# Patient Record
Sex: Male | Born: 1955
Health system: Southern US, Community
[De-identification: ages and names within clinical notes are randomized; demographics above are authoritative.]

## PROBLEM LIST (undated history)

## (undated) DIAGNOSIS — R351 Nocturia: Secondary | ICD-10-CM

## (undated) DIAGNOSIS — E291 Testicular hypofunction: Secondary | ICD-10-CM

## (undated) DIAGNOSIS — T8859XA Other complications of anesthesia, initial encounter: Secondary | ICD-10-CM

## (undated) DIAGNOSIS — J40 Bronchitis, not specified as acute or chronic: Secondary | ICD-10-CM

## (undated) DIAGNOSIS — E119 Type 2 diabetes mellitus without complications: Secondary | ICD-10-CM

## (undated) DIAGNOSIS — R350 Frequency of micturition: Secondary | ICD-10-CM

## (undated) DIAGNOSIS — K219 Gastro-esophageal reflux disease without esophagitis: Secondary | ICD-10-CM

## (undated) DIAGNOSIS — I10 Essential (primary) hypertension: Secondary | ICD-10-CM

## (undated) DIAGNOSIS — N400 Enlarged prostate without lower urinary tract symptoms: Secondary | ICD-10-CM

## (undated) DIAGNOSIS — E785 Hyperlipidemia, unspecified: Secondary | ICD-10-CM

## (undated) DIAGNOSIS — F419 Anxiety disorder, unspecified: Secondary | ICD-10-CM

## (undated) DIAGNOSIS — M1990 Unspecified osteoarthritis, unspecified site: Secondary | ICD-10-CM

## (undated) DIAGNOSIS — T4145XA Adverse effect of unspecified anesthetic, initial encounter: Secondary | ICD-10-CM

## (undated) DIAGNOSIS — I82409 Acute embolism and thrombosis of unspecified deep veins of unspecified lower extremity: Secondary | ICD-10-CM

## (undated) DIAGNOSIS — T7840XA Allergy, unspecified, initial encounter: Secondary | ICD-10-CM

## (undated) DIAGNOSIS — M25371 Other instability, right ankle: Secondary | ICD-10-CM

## (undated) DIAGNOSIS — G4730 Sleep apnea, unspecified: Secondary | ICD-10-CM

## (undated) DIAGNOSIS — Z227 Latent tuberculosis: Secondary | ICD-10-CM

## (undated) DIAGNOSIS — N529 Male erectile dysfunction, unspecified: Secondary | ICD-10-CM

## (undated) HISTORY — DX: Unspecified osteoarthritis, unspecified site: M19.90

## (undated) HISTORY — DX: Allergy, unspecified, initial encounter: T78.40XA

## (undated) HISTORY — DX: Essential (primary) hypertension: I10

## (undated) HISTORY — DX: Benign prostatic hyperplasia without lower urinary tract symptoms: N40.0

## (undated) HISTORY — DX: Testicular hypofunction: E29.1

## (undated) HISTORY — PX: KNEE ARTHROSCOPY: SUR90

## (undated) HISTORY — DX: Anxiety disorder, unspecified: F41.9

## (undated) HISTORY — DX: Acute embolism and thrombosis of unspecified deep veins of unspecified lower extremity: I82.409

## (undated) HISTORY — PX: HERNIA REPAIR: SHX51

## (undated) HISTORY — DX: Other instability, right ankle: M25.371

## (undated) HISTORY — DX: Type 2 diabetes mellitus without complications: E11.9

## (undated) HISTORY — DX: Frequency of micturition: R35.0

## (undated) HISTORY — DX: Gastro-esophageal reflux disease without esophagitis: K21.9

## (undated) HISTORY — DX: Sleep apnea, unspecified: G47.30

## (undated) HISTORY — DX: Hyperlipidemia, unspecified: E78.5

## (undated) HISTORY — DX: Latent tuberculosis: Z22.7

## (undated) HISTORY — DX: Male erectile dysfunction, unspecified: N52.9

## (undated) HISTORY — PX: ANKLE SURGERY: SHX546

## (undated) HISTORY — DX: Nocturia: R35.1

---

## 2006-02-23 ENCOUNTER — Ambulatory Visit: Payer: Self-pay | Admitting: General Surgery

## 2006-12-01 HISTORY — PX: OTHER SURGICAL HISTORY: SHX169

## 2006-12-15 ENCOUNTER — Ambulatory Visit (HOSPITAL_BASED_OUTPATIENT_CLINIC_OR_DEPARTMENT_OTHER): Admission: RE | Admit: 2006-12-15 | Discharge: 2006-12-16 | Payer: Self-pay | Admitting: Orthopedic Surgery

## 2007-01-02 ENCOUNTER — Inpatient Hospital Stay: Payer: Self-pay | Admitting: Internal Medicine

## 2007-01-02 ENCOUNTER — Other Ambulatory Visit: Payer: Self-pay

## 2007-01-14 ENCOUNTER — Ambulatory Visit: Payer: Self-pay | Admitting: General Practice

## 2007-02-25 ENCOUNTER — Ambulatory Visit: Payer: Self-pay | Admitting: Vascular Surgery

## 2007-08-02 ENCOUNTER — Ambulatory Visit: Payer: Self-pay | Admitting: Internal Medicine

## 2007-08-17 ENCOUNTER — Ambulatory Visit: Payer: Self-pay | Admitting: Internal Medicine

## 2007-09-01 ENCOUNTER — Ambulatory Visit: Payer: Self-pay | Admitting: Internal Medicine

## 2007-12-02 HISTORY — PX: COLONOSCOPY: SHX174

## 2007-12-07 ENCOUNTER — Ambulatory Visit: Payer: Self-pay | Admitting: Vascular Surgery

## 2008-01-02 ENCOUNTER — Ambulatory Visit: Payer: Self-pay | Admitting: Internal Medicine

## 2008-05-30 ENCOUNTER — Ambulatory Visit: Payer: Self-pay | Admitting: General Practice

## 2008-08-15 ENCOUNTER — Ambulatory Visit: Payer: Self-pay | Admitting: General Practice

## 2008-10-05 ENCOUNTER — Ambulatory Visit: Payer: Self-pay | Admitting: Unknown Physician Specialty

## 2010-07-26 ENCOUNTER — Ambulatory Visit: Payer: Self-pay | Admitting: General Practice

## 2012-03-11 ENCOUNTER — Emergency Department: Payer: Self-pay | Admitting: Emergency Medicine

## 2013-02-21 DIAGNOSIS — M25371 Other instability, right ankle: Secondary | ICD-10-CM | POA: Insufficient documentation

## 2015-02-04 ENCOUNTER — Emergency Department: Payer: Self-pay | Admitting: Emergency Medicine

## 2015-05-17 ENCOUNTER — Telehealth: Payer: Self-pay

## 2015-05-17 NOTE — Telephone Encounter (Signed)
Spoke with Jonathan Fields from [employer] in [place] in reference to pt PA for cialis 5mg . Medication was approved for 4 tab [id number]. Per Jonathan Fields pt wants to take medication daily for BPH s/s. Advised Jonathan Fields pt will need to make a f/u appt to speak with Dr. Elnoria Howard about this. Cw,lpn

## 2015-06-06 ENCOUNTER — Telehealth: Payer: Self-pay | Admitting: Urology

## 2015-06-08 NOTE — Telephone Encounter (Signed)
none

## 2015-06-20 ENCOUNTER — Encounter: Payer: Self-pay | Admitting: Urology

## 2015-06-20 ENCOUNTER — Ambulatory Visit (INDEPENDENT_AMBULATORY_CARE_PROVIDER_SITE_OTHER): Payer: BLUE CROSS/BLUE SHIELD | Admitting: Urology

## 2015-06-20 VITALS — BP 155/94 | HR 88 | Ht 74.0 in | Wt <= 1120 oz

## 2015-06-20 DIAGNOSIS — N528 Other male erectile dysfunction: Secondary | ICD-10-CM

## 2015-06-20 DIAGNOSIS — R350 Frequency of micturition: Secondary | ICD-10-CM | POA: Diagnosis not present

## 2015-06-20 DIAGNOSIS — N529 Male erectile dysfunction, unspecified: Secondary | ICD-10-CM

## 2015-06-20 LAB — MICROSCOPIC EXAMINATION: Bacteria, UA: NONE SEEN

## 2015-06-20 LAB — URINALYSIS, COMPLETE
Bilirubin, UA: NEGATIVE
Leukocytes, UA: NEGATIVE
Nitrite, UA: NEGATIVE
RBC, UA: NEGATIVE
Specific Gravity, UA: 1.025 (ref 1.005–1.030)
Urobilinogen, Ur: 1 mg/dL (ref 0.2–1.0)
pH, UA: 6.5 (ref 5.0–7.5)

## 2015-06-20 MED ORDER — TADALAFIL 20 MG PO TABS: 20.0000 mg | ORAL_TABLET | Freq: Every day | ORAL | Status: DC | PRN

## 2015-06-20 NOTE — Progress Notes (Signed)
Patient in the office today. I recently seen him worked him up. He tried to obtain Cialis. Apparently the Cialis was not felt that his drug store as ordered. I reordered the Cialis today will continue on the Cialis as it helped him immensely. I'll see him again in about 2 months.

## 2015-06-21 ENCOUNTER — Other Ambulatory Visit: Payer: Self-pay

## 2015-06-21 DIAGNOSIS — N529 Male erectile dysfunction, unspecified: Secondary | ICD-10-CM

## 2015-06-21 MED ORDER — TADALAFIL 5 MG PO TABS: 5.0000 mg | ORAL_TABLET | Freq: Every day | ORAL | Status: DC | PRN

## 2015-06-26 ENCOUNTER — Telehealth: Payer: Self-pay

## 2015-06-26 NOTE — Telephone Encounter (Signed)
Pt called stating he is unable to fill 30 day supply of cialis. Per Dr. Guinevere Ferrari office note from [employer] pt was receive a 30 day supply. Nurse made pt aware PA has previously been denied due to medication being rx for ED. Pt voiced understanding stating he would pay the difference. Nurse called in 30 day supply for pt based on Dr. Guinevere Ferrari note from 01/2015.

## 2015-08-24 ENCOUNTER — Ambulatory Visit: Payer: BLUE CROSS/BLUE SHIELD | Admitting: Urology

## 2015-08-30 ENCOUNTER — Encounter: Payer: Self-pay | Admitting: Urology

## 2015-08-30 ENCOUNTER — Ambulatory Visit (INDEPENDENT_AMBULATORY_CARE_PROVIDER_SITE_OTHER): Payer: BLUE CROSS/BLUE SHIELD | Admitting: Urology

## 2015-08-30 VITALS — BP 156/89 | HR 81 | Ht 74.0 in | Wt 259.6 lb

## 2015-08-30 DIAGNOSIS — E291 Testicular hypofunction: Secondary | ICD-10-CM | POA: Diagnosis not present

## 2015-08-30 DIAGNOSIS — R350 Frequency of micturition: Secondary | ICD-10-CM | POA: Diagnosis not present

## 2015-08-30 DIAGNOSIS — N529 Male erectile dysfunction, unspecified: Secondary | ICD-10-CM | POA: Diagnosis not present

## 2015-08-30 DIAGNOSIS — N400 Enlarged prostate without lower urinary tract symptoms: Secondary | ICD-10-CM

## 2015-08-30 MED ORDER — TAMSULOSIN HCL 0.4 MG PO CAPS: 0.8000 mg | ORAL_CAPSULE | Freq: Every day | ORAL | Status: DC

## 2015-08-30 NOTE — Progress Notes (Signed)
08/30/2015 3:14 PM   Jonathan Fields [birth year] [id number]  Referring provider: Shepard General, MD [facility] [address] [place], [place] [address]  Chief Complaint  Patient presents with  . Urinary Frequency    follow up    HPI: The patient is a 59 year old male possible history of ED and BPH presents for follow-up. He is currently on Cialis 5 mg and Flomax 0.4 mg. He still complains of frequency 15 today and nocturia 1-2 per night. He finds very bothersome and would like a medication to correct this. He returns with his erections, Cialis for the most part is able to provide erection hard enough for penetration. He is very happy with the erectile dysfunction medication. He also has hypogonadism on testosterone. This is managed by his primary care clinic.   PMH: Past Medical History  Diagnosis Date  . Hyperlipemia   . Diabetes   . Sleep apnea   . Hypertension   . GERD (gastroesophageal reflux disease)   . Hypogonadism in male   . DVT (deep venous thrombosis)     post op  . Nocturia   . Urinary frequency   . ED (erectile dysfunction)     Surgical History: Past Surgical History  Procedure Laterality Date  . Colonoscopy  2009    Home Medications:    Medication List       This list is accurate as of: 08/30/15  3:14 PM.  Always use your most recent med list.               bisoprolol-hydrochlorothiazide 5-6.25 MG tablet  Commonly known as:  ZIAC  Take 2 tablets by mouth daily.     fluticasone 50 MCG/ACT nasal spray  Commonly known as:  FLONASE  instill 1 to 2 sprays into each nostril once daily     metFORMIN 1000 MG tablet  Commonly known as:  GLUCOPHAGE  Take 1,000 mg by mouth 2 (two) times daily.     omeprazole 10 MG capsule  Commonly known as:  PRILOSEC  Take by mouth.     tadalafil 5 MG tablet  Commonly known as:  CIALIS  Take 1 tablet (5 mg total) by mouth daily as needed.     tamsulosin 0.4 MG Caps capsule    Commonly known as:  FLOMAX  Take 2 capsules (0.8 mg total) by mouth daily.     testosterone cypionate 200 MG/ML injection  Commonly known as:  DEPOTESTOSTERONE CYPIONATE        Allergies:  Allergies  Allergen Reactions  . Penicillins Hives    Family History: Family History  Problem Relation Age of Onset  . Prostate cancer Father     Social History:  reports that he has never smoked. He does not have any smokeless tobacco history on file. He reports that he does not drink alcohol or use illicit drugs.  ROS: UROLOGY Frequent Urination?: Yes Hard to postpone urination?: No Burning/pain with urination?: No Get up at night to urinate?: Yes Leakage of urine?: No Urine stream starts and stops?: No Trouble starting stream?: No Do you have to strain to urinate?: No Blood in urine?: No Urinary tract infection?: No Sexually transmitted disease?: No Injury to kidneys or bladder?: No Painful intercourse?: No Weak stream?: No Erection problems?: No Penile pain?: No  Gastrointestinal Nausea?: No Vomiting?: No Indigestion/heartburn?: No Diarrhea?: No Constipation?: No  Constitutional Fever: No Night sweats?: No Weight loss?: No Fatigue?: No  Skin Skin rash/lesions?: No Itching?: No  Eyes Blurred vision?: No Double vision?: No  Ears/Nose/Throat Sore throat?: No Sinus problems?: No  Hematologic/Lymphatic Swollen glands?: No Easy bruising?: No  Cardiovascular Leg swelling?: No Chest pain?: No  Respiratory Cough?: No Shortness of breath?: No  Endocrine Excessive thirst?: No  Musculoskeletal Back pain?: No Joint pain?: Yes  Neurological Headaches?: No Dizziness?: No  Psychologic Depression?: No Anxiety?: No  Physical Exam: BP 156/89 mmHg  Pulse 81  Ht 6\' 2"  (1.88 m)  Wt 259 lb 9.6 oz (117.754 kg)  BMI 33.32 kg/m2  Constitutional:  Alert and oriented, No acute distress. HEENT: [facility] AT, moist mucus membranes.  Trachea midline, no  masses. Cardiovascular: No clubbing, cyanosis, or edema. Respiratory: Normal respiratory effort, no increased work of breathing. GI: Abdomen is soft, nontender, nondistended, no abdominal masses GU: No CVA tenderness.  Skin: No rashes, bruises or suspicious lesions. Lymph: No cervical or inguinal adenopathy. Neurologic: Grossly intact, no focal deficits, moving all 4 extremities. Psychiatric: Normal mood and affect.  Laboratory Data: No results found for: WBC, HGB, HCT, MCV, PLT  No results found for: CREATININE  No results found for: PSA  No results found for: TESTOSTERONE  No results found for: HGBA1C  Urinalysis    Component Value Date/Time   GLUCOSEU Trace* 06/20/2015 1427   BILIRUBINUR Negative 06/20/2015 1427   NITRITE Negative 06/20/2015 1427   LEUKOCYTESUR Negative 06/20/2015 1427   Assessment & Plan:    1. BPH with LUTS -continue cialis 5 mg -increase flomax to 0.8 mg daily  2. Erectile Dysfunction -continue cialis 5 mg daily  3.  Hypogonadism -Will continue to follow-up for testosterone injections at primary care clinic  Return in about 3 months (around 11/29/2015).  [name], MD  [facility] [address] [place], [place] [address] [phone]

## 2015-09-03 LAB — MICROSCOPIC EXAMINATION
Bacteria, UA: NONE SEEN
Renal Epithel, UA: NONE SEEN /hpf

## 2015-09-03 LAB — URINALYSIS, COMPLETE
Bilirubin, UA: NEGATIVE
Glucose, UA: NEGATIVE
Ketones, UA: NEGATIVE
Leukocytes, UA: NEGATIVE
Nitrite, UA: NEGATIVE
Specific Gravity, UA: 1.025 (ref 1.005–1.030)
Urobilinogen, Ur: 1 mg/dL (ref 0.2–1.0)
pH, UA: 6.5 (ref 5.0–7.5)

## 2015-11-14 ENCOUNTER — Telehealth: Payer: Self-pay | Admitting: Urology

## 2015-11-14 ENCOUNTER — Telehealth: Payer: Self-pay

## 2015-11-14 NOTE — Telephone Encounter (Signed)
See previous note

## 2015-11-14 NOTE — Telephone Encounter (Signed)
Spoke with pt and made aware PA for cialis 5mg  is approved for 11/09/15-12/9-17.

## 2015-11-14 NOTE — Telephone Encounter (Signed)
Pt tried getting Cialis 5 mg filled at his pharmacy, [facility] in [place], last week.  Pharmacy stated that his prescription was denied by his insurance and patient wants to know why.  He has multiple refills on it.  Please call [phone].

## 2015-11-30 ENCOUNTER — Encounter: Payer: Self-pay | Admitting: Urology

## 2015-11-30 ENCOUNTER — Other Ambulatory Visit: Payer: Self-pay

## 2015-11-30 ENCOUNTER — Ambulatory Visit (INDEPENDENT_AMBULATORY_CARE_PROVIDER_SITE_OTHER): Payer: BLUE CROSS/BLUE SHIELD | Admitting: Urology

## 2015-11-30 VITALS — BP 129/83 | HR 80 | Ht 74.0 in | Wt 254.2 lb

## 2015-11-30 DIAGNOSIS — N400 Enlarged prostate without lower urinary tract symptoms: Secondary | ICD-10-CM

## 2015-11-30 DIAGNOSIS — N5201 Erectile dysfunction due to arterial insufficiency: Secondary | ICD-10-CM

## 2015-11-30 DIAGNOSIS — N3281 Overactive bladder: Secondary | ICD-10-CM | POA: Diagnosis not present

## 2015-11-30 LAB — URINALYSIS, COMPLETE
Bilirubin, UA: NEGATIVE
Ketones, UA: NEGATIVE
Leukocytes, UA: NEGATIVE
Nitrite, UA: NEGATIVE
RBC, UA: NEGATIVE
Specific Gravity, UA: >1.03 — ABNORMAL HIGH (ref 1.005–1.030)
Urobilinogen, Ur: 0.2 mg/dL (ref 0.2–1.0)
pH, UA: 5.5 (ref 5.0–7.5)

## 2015-11-30 LAB — MICROSCOPIC EXAMINATION
Epithelial Cells (non renal): NONE SEEN /hpf (ref 0–10)
RBC, UA: NONE SEEN /hpf (ref 0–?)

## 2015-11-30 LAB — BLADDER SCAN AMB NON-IMAGING: Scan Result: 15

## 2015-11-30 MED ORDER — MIRABEGRON ER 25 MG PO TB24: 25.0000 mg | ORAL_TABLET | Freq: Every day | ORAL | Status: DC

## 2015-11-30 NOTE — Progress Notes (Signed)
Bladder Scan Patient void: 8ml Performed By: Larna Daughters

## 2015-11-30 NOTE — Progress Notes (Signed)
11/30/2015 1:34 PM   Jonathan Fields 09/14/1956 [id number]  Referring provider: Shepard General, MD [facility] [place] [address], [place] [address]  Chief Complaint  Patient presents with  . Benign Prostatic Hypertrophy    f/u    HPI: The patient is a 59 year old male possible history of ED and BPH presents for follow-up. He is currently on Cialis 5 mg and Flomax 0.4 mg. He still complains of frequency 15 today and nocturia 1-2 per night. He finds very bothersome and would like a medication to correct this. He returns with his erections, Cialis for the most part is able to provide erection hard enough for penetration. He is very happy with the erectile dysfunction medication. He also has hypogonadism on testosterone. This is managed by his primary care clinic.  December 2016 Interval History: The patient returns today for symptom at follow-up. Ventricular erectile dysfunction and Cialis is working well for him. His I PSS score today is 12/4. He has significant frequency urgency and nocturia. He does not have much problem with weak stream, straining, intermittency or incomplete emptying. His PVR today is 21.  He does drink 3-4 bottles of water during the day. He stops drinking fluid around 8:00. He said that he is persistently up throughout the night urinating sometimes up to 4 times. The frequency is very bothersome to him. Even when he decrease his fluid intake, he continues to have frequency at the same right.   PMH: Past Medical History  Diagnosis Date  . Hyperlipemia   . Diabetes   . Sleep apnea   . Hypertension   . GERD (gastroesophageal reflux disease)   . Hypogonadism in male   . DVT (deep venous thrombosis)     post op  . Nocturia   . Urinary frequency   . ED (erectile dysfunction)     Surgical History: Past Surgical History  Procedure Laterality Date  . Colonoscopy  2009    Home Medications:    Medication List       This  list is accurate as of: 11/30/15  1:34 PM.  Always use your most recent med list.               azithromycin 250 MG tablet  Commonly known as:  ZITHROMAX  take 2 tablets by mouth on day 1 then 1 tablet on days 2 through 5     bisoprolol-hydrochlorothiazide 5-6.25 MG tablet  Commonly known as:  ZIAC  Take 2 tablets by mouth daily.     CIALIS 5 MG tablet  Generic drug:  tadalafil  Take by mouth.     etodolac 500 MG tablet  Commonly known as:  LODINE  Reported on 11/30/2015     fluticasone 50 MCG/ACT nasal spray  Commonly known as:  FLONASE  instill 1 to 2 sprays into each nostril once daily     GUAIFENESIN AC 100-10 MG/5ML syrup  Generic drug:  guaiFENesin-codeine  take 5 milliliters by mouth three times a day if needed     metFORMIN 1000 MG tablet  Commonly known as:  GLUCOPHAGE  Take 1,000 mg by mouth 2 (two) times daily.     omeprazole 10 MG capsule  Commonly known as:  PRILOSEC  Take by mouth. Reported on 11/30/2015     Protein Powd  Take by mouth.     tamsulosin 0.4 MG Caps capsule  Commonly known as:  FLOMAX  Take 2 capsules (0.8 mg total) by mouth daily.  testosterone cypionate 200 MG/ML injection  Commonly known as:  DEPOTESTOSTERONE CYPIONATE        Allergies:  Allergies  Allergen Reactions  . Penicillins Hives    Family History: Family History  Problem Relation Age of Onset  . Prostate cancer Father     Social History:  reports that he has never smoked. He does not have any smokeless tobacco history on file. He reports that he does not drink alcohol or use illicit drugs.  ROS:                                        Physical Exam: There were no vitals taken for this visit.  Constitutional:  Alert and oriented, No acute distress. HEENT: [place] AT, moist mucus membranes.  Trachea midline, no masses. Cardiovascular: No clubbing, cyanosis, or edema. Respiratory: Normal respiratory effort, no increased work of  breathing. GI: Abdomen is soft, nontender, nondistended, no abdominal masses GU: No CVA tenderness.  Skin: No rashes, bruises or suspicious lesions. Lymph: No cervical or inguinal adenopathy. Neurologic: Grossly intact, no focal deficits, moving all 4 extremities. Psychiatric: Normal mood and affect.  Laboratory Data: No results found for: WBC, HGB, HCT, MCV, PLT  No results found for: CREATININE  No results found for: PSA  No results found for: TESTOSTERONE  No results found for: HGBA1C  Urinalysis    Component Value Date/Time   GLUCOSEU Negative 08/30/2015 1451   BILIRUBINUR Negative 08/30/2015 1451   NITRITE Negative 08/30/2015 1451   LEUKOCYTESUR Negative 08/30/2015 1451     Assessment & Plan:    The patient's obstructive voiding symptoms seem to be well treated due to his low PVR and minimal complaints in regards to obstructive urinary symptoms. He does however have significant symptoms suggesting overactive bladder. We will try him on Myrbetriq 25 mg daily. He was given 2 weeks of samples and prescription was sent to his pharmacy. We'll follow-up in 3 months to check on his progress. He is due for PSA and DRE at that time.  1. BPH with LUTS -continue cialis 5 mg -increase flomax to 0.8 mg daily  2. Overactive bladder -Myrbetriq 25 mg daily. Samples given and Rx sent to pharmacy  3. Erectile Dysfunction -continue cialis 5 mg daily  3. Hypogonadism -Will continue to follow-up for testosterone injections at primary care clinic  4. PSA Screening PSA/DRE at next visit  Return in about 3 months with PSA prior   [name], [place] [address] [place], [place] [address] [phone]

## 2015-12-07 ENCOUNTER — Telehealth: Payer: Self-pay

## 2015-12-07 NOTE — Telephone Encounter (Signed)
Pt pharmacy sent a PA for myrbetriq 25mg . PA was obtained and insurance co responded with "PPA not required". Pt has been made aware. Pt is going to check with pharmacy for cost. If not able to afford [facility] will supply samples.

## 2015-12-31 ENCOUNTER — Telehealth: Payer: Self-pay | Admitting: Radiology

## 2015-12-31 NOTE — Telephone Encounter (Signed)
Pt called stating Myrbetriq was declined by insurance. Pt wants samples or a different medication. Please return call at [phone].

## 2016-01-01 NOTE — Telephone Encounter (Signed)
Spoke with pt in reference to myrbetriq. Made aware samples will be left at the front. Nurse will be completing a form provided by rep to appeal denial from insurance co. Pt voiced understanding.

## 2016-02-20 ENCOUNTER — Other Ambulatory Visit: Payer: Self-pay

## 2016-02-20 DIAGNOSIS — N400 Enlarged prostate without lower urinary tract symptoms: Secondary | ICD-10-CM

## 2016-02-21 ENCOUNTER — Other Ambulatory Visit: Payer: BLUE CROSS/BLUE SHIELD

## 2016-02-21 DIAGNOSIS — N400 Enlarged prostate without lower urinary tract symptoms: Secondary | ICD-10-CM

## 2016-02-22 LAB — PSA: Prostate Specific Ag, Serum: 2.9 ng/mL (ref 0.0–4.0)

## 2016-02-25 ENCOUNTER — Telehealth: Payer: Self-pay

## 2016-02-25 NOTE — Telephone Encounter (Signed)
LMOM

## 2016-02-25 NOTE — Telephone Encounter (Signed)
-----   Message from [name], MD sent at 02/22/2016 12:21 PM EDT ----- Please let patient know PSA is normal.

## 2016-02-26 NOTE — Telephone Encounter (Signed)
LMOM- will send a letter.  

## 2016-02-28 ENCOUNTER — Ambulatory Visit (INDEPENDENT_AMBULATORY_CARE_PROVIDER_SITE_OTHER): Payer: BLUE CROSS/BLUE SHIELD | Admitting: Urology

## 2016-02-28 VITALS — BP 147/92 | HR 85 | Ht 74.0 in | Wt 256.0 lb

## 2016-02-28 DIAGNOSIS — E291 Testicular hypofunction: Secondary | ICD-10-CM

## 2016-02-28 DIAGNOSIS — Z125 Encounter for screening for malignant neoplasm of prostate: Secondary | ICD-10-CM

## 2016-02-28 DIAGNOSIS — N400 Enlarged prostate without lower urinary tract symptoms: Secondary | ICD-10-CM

## 2016-02-28 DIAGNOSIS — N529 Male erectile dysfunction, unspecified: Secondary | ICD-10-CM

## 2016-02-28 DIAGNOSIS — N3281 Overactive bladder: Secondary | ICD-10-CM

## 2016-02-28 LAB — BLADDER SCAN AMB NON-IMAGING

## 2016-02-28 NOTE — Progress Notes (Signed)
02/28/2016 3:38 PM   Jonathan Fields 1956-04-15 [id number]  Referring provider: Shepard General, MD [facility] [address] [place], [place] [address]  Chief Complaint  Patient presents with  . Benign Prostatic Hypertrophy    44month    HPI: The patient is a 60 year old male possible history of ED and BPH presents for follow-up. He is currently on Cialis 5 mg and Flomax 0.4 mg. He still complains of frequency 15 today and nocturia 1-2 per night. He finds very bothersome and would like a medication to correct this. He returns with his erections, Cialis for the most part is able to provide erection hard enough for penetration. He is very happy with the erectile dysfunction medication. He also has hypogonadism on testosterone. This is managed by his primary care clinic.  December 2016 Interval History: The patient returns today for symptom at follow-up. Ventricular erectile dysfunction and Cialis is working well for him. His I PSS score today is 12/4. He has significant frequency urgency and nocturia. He does not have much problem with weak stream, straining, intermittency or incomplete emptying. His PVR today is 21. He does drink 3-4 bottles of water during the day. He stops drinking fluid around 8:00. He said that he is persistently up throughout the night urinating sometimes up to 4 times. The frequency is very bothersome to him. Even when he decrease his fluid intake, he continues to have frequency at the same right.  March 2017 Interval History: Due to his significant symptoms, his Flomax was increased to 0.8 mg daily, and he was started on Myrbetriq at his last visit. He notes no changes in his symptoms. However see PVR has increased to 144. His I PSS score today is 14/5. He has nocturia 1. His biggest complaint is urgency and frequency.  He is able to obtain good erection with Cialis 5 mg when necessary.  PSA March 2017: 2.9  PMH: Past Medical  History  Diagnosis Date  . Hyperlipemia   . Diabetes ([place])   . Sleep apnea   . Hypertension   . GERD (gastroesophageal reflux disease)   . Hypogonadism in male   . DVT (deep venous thrombosis) ([place])     post op  . Nocturia   . Urinary frequency   . ED (erectile dysfunction)     Surgical History: Past Surgical History  Procedure Laterality Date  . Colonoscopy  2009    Home Medications:    Medication List       This list is accurate as of: 02/28/16  3:38 PM.  Always use your most recent med list.               bisoprolol-hydrochlorothiazide 5-6.25 MG tablet  Commonly known as:  ZIAC  Take 2 tablets by mouth daily.     CIALIS 5 MG tablet  Generic drug:  tadalafil  Take by mouth.     etodolac 500 MG tablet  Commonly known as:  LODINE  Reported on 11/30/2015     fluticasone 50 MCG/ACT nasal spray  Commonly known as:  FLONASE  instill 1 to 2 sprays into each nostril once daily     metFORMIN 1000 MG tablet  Commonly known as:  GLUCOPHAGE  Take 1,000 mg by mouth 2 (two) times daily.     mirabegron ER 25 MG Tb24 tablet  Commonly known as:  MYRBETRIQ  Take 1 tablet (25 mg total) by mouth daily.     omeprazole 10 MG capsule  Commonly known as:  PRILOSEC  Take by mouth. Reported on 11/30/2015     Protein Powd  Take by mouth.     tamsulosin 0.4 MG Caps capsule  Commonly known as:  FLOMAX  Take 2 capsules (0.8 mg total) by mouth daily.     testosterone cypionate 200 MG/ML injection  Commonly known as:  DEPOTESTOSTERONE CYPIONATE        Allergies:  Allergies  Allergen Reactions  . Penicillins Hives    Family History: Family History  Problem Relation Age of Onset  . Prostate cancer Father     Social History:  reports that he has never smoked. He does not have any smokeless tobacco history on file. He reports that he does not drink alcohol or use illicit drugs.  ROS: UROLOGY Frequent Urination?: Yes Hard to postpone urination?: Yes Burning/pain  with urination?: No Get up at night to urinate?: Yes Leakage of urine?: No Urine stream starts and stops?: Yes Trouble starting stream?: No Do you have to strain to urinate?: No Blood in urine?: No Urinary tract infection?: No Sexually transmitted disease?: No Injury to kidneys or bladder?: No Painful intercourse?: No Weak stream?: No Erection problems?: No Penile pain?: No  Gastrointestinal Nausea?: No Vomiting?: No Indigestion/heartburn?: No Diarrhea?: No Constipation?: No  Constitutional Fever: No Night sweats?: No Weight loss?: No Fatigue?: No  Skin Skin rash/lesions?: No Itching?: No  Eyes Blurred vision?: No Double vision?: No  Ears/Nose/Throat Sore throat?: No Sinus problems?: Yes  Hematologic/Lymphatic Swollen glands?: No Easy bruising?: No  Cardiovascular Leg swelling?: No Chest pain?: No  Respiratory Cough?: No Shortness of breath?: No  Endocrine Excessive thirst?: No  Musculoskeletal Back pain?: No Joint pain?: No  Neurological Headaches?: No Dizziness?: No  Psychologic Depression?: No Anxiety?: No  Physical Exam: BP 147/92 mmHg  Pulse 85  Ht 6\' 2"  (1.88 m)  Wt 256 lb (116.121 kg)  BMI 32.85 kg/m2  Constitutional:  Alert and oriented, No acute distress. HEENT: [place] AT, moist mucus membranes.  Trachea midline, no masses. Cardiovascular: No clubbing, cyanosis, or edema. Respiratory: Normal respiratory effort, no increased work of breathing. GI: Abdomen is soft, nontender, nondistended, no abdominal masses GU: No CVA tenderness. DRE: 2+, smooth, no nodules. Skin: No rashes, bruises or suspicious lesions. Lymph: No cervical or inguinal adenopathy. Neurologic: Grossly intact, no focal deficits, moving all 4 extremities. Psychiatric: Normal mood and affect.  Laboratory Data: No results found for: WBC, HGB, HCT, MCV, PLT  No results found for: CREATININE  No results found for: PSA  No results found for: TESTOSTERONE  No  results found for: HGBA1C  Urinalysis    Component Value Date/Time   APPEARANCEUR Clear 11/30/2015 1326   GLUCOSEU 2+* 11/30/2015 1326   BILIRUBINUR Negative 11/30/2015 1326   PROTEINUR 1+* 11/30/2015 1326   NITRITE Negative 11/30/2015 1326   LEUKOCYTESUR Negative 11/30/2015 1326    Assessment & Plan:    1. BPH with LUTS -increase flomax to 0.8 mg daily -Will have the patient keep a voiding diary for the next few weeks. He'll then follow-up to go over this in 2-3 weeks. If this does not elicit the cause of his symptoms, I think he would be a good candidate for urodynamic studies as he his main complain is urgency despite Myrbetriq and low PVR.  2. Urinary frequency/urgency -Discontinue Myrbetriq as this has not helped his urgency or frequency symptoms and caused a slight increase in his PVR.  3. Erectile Dysfunction -continue cialis 5 mg daily  3. Hypogonadism -Will continue to follow-up for testosterone injections at primary care clinic  4. PSA Screening Up to date. Due for repeat PSA and DRE in March 2018.  Return in about 3 weeks (around 03/20/2016).  [name], MD  [facility] [address] [place], [place] [address] [phone]

## 2016-03-21 ENCOUNTER — Ambulatory Visit (INDEPENDENT_AMBULATORY_CARE_PROVIDER_SITE_OTHER): Payer: BLUE CROSS/BLUE SHIELD | Admitting: Urology

## 2016-03-21 ENCOUNTER — Encounter: Payer: Self-pay | Admitting: Urology

## 2016-03-21 VITALS — BP 151/91 | HR 83 | Ht 74.0 in | Wt 255.0 lb

## 2016-03-21 DIAGNOSIS — N400 Enlarged prostate without lower urinary tract symptoms: Secondary | ICD-10-CM | POA: Diagnosis not present

## 2016-03-21 DIAGNOSIS — N3281 Overactive bladder: Secondary | ICD-10-CM | POA: Diagnosis not present

## 2016-03-21 DIAGNOSIS — N529 Male erectile dysfunction, unspecified: Secondary | ICD-10-CM

## 2016-03-21 LAB — URINALYSIS, COMPLETE
Bilirubin, UA: NEGATIVE
Glucose, UA: NEGATIVE
Ketones, UA: NEGATIVE
Leukocytes, UA: NEGATIVE
Nitrite, UA: NEGATIVE
RBC, UA: NEGATIVE
Specific Gravity, UA: 1.025 (ref 1.005–1.030)
Urobilinogen, Ur: 1 mg/dL (ref 0.2–1.0)
pH, UA: 6 (ref 5.0–7.5)

## 2016-03-21 LAB — MICROSCOPIC EXAMINATION
Bacteria, UA: NONE SEEN
RBC, UA: NONE SEEN /hpf (ref 0–?)
WBC, UA: NONE SEEN /hpf (ref 0–?)

## 2016-03-21 LAB — BLADDER SCAN AMB NON-IMAGING

## 2016-03-21 MED ORDER — TAMSULOSIN HCL 0.4 MG PO CAPS: 0.8000 mg | ORAL_CAPSULE | Freq: Every day | ORAL | Status: DC

## 2016-03-21 NOTE — Progress Notes (Signed)
03/21/2016 3:44 PM   Jonathan Fields 1956-06-01 [id number]  Referring provider: Shepard General, MD [facility] [place] [address] [name], [place] [address]  Chief Complaint  Patient presents with  . Benign Prostatic Hypertrophy    3wk    HPI: The patient is a 60 year old male possible history of ED and BPH presents for follow-up. He is currently on Cialis 5 mg and Flomax 0.4 mg. He still complains of frequency 15 today and nocturia 1-2 per night. He finds very bothersome and would like a medication to correct this. He returns with his erections, Cialis for the most part is able to provide erection hard enough for penetration. He is very happy with the erectile dysfunction medication. He also has hypogonadism on testosterone. This is managed by his primary care clinic.  December 2016 Interval History: The patient returns today for symptom at follow-up. Ventricular erectile dysfunction and Cialis is working well for him. His I PSS score today is 12/4. He has significant frequency urgency and nocturia. He does not have much problem with weak stream, straining, intermittency or incomplete emptying. His PVR today is 21. He does drink 3-4 bottles of water during the day. He stops drinking fluid around 8:00. He said that he is persistently up throughout the night urinating sometimes up to 4 times. The frequency is very bothersome to him. Even when he decrease his fluid intake, he continues to have frequency at the same right.  March 2017 Interval History: Due to his significant symptoms, his Flomax was increased to 0.8 mg daily, and he was started on Myrbetriq at his last visit. He notes no changes in his symptoms. However see PVR has increased to 144. His I PSS score today is 14/5. He has nocturia 1. His biggest complaint is urgency and frequency.  He is able to obtain good erection with Cialis 5 mg when necessary.  PSA March 2017: 2.09 March 2016 interval  history: The patient returns today to keep a voiding diary. He is voiding approximately every 2 hours during the day for volume 100 cc. This is unchanged. He is frustrated as his urgency and frequency and nocturia better. He does have urge incontinence if he does not find his way to the bathroom when he gets the urge   PMH: Past Medical History  Diagnosis Date  . Hyperlipemia   . Diabetes ([place])   . Sleep apnea   . Hypertension   . GERD (gastroesophageal reflux disease)   . Hypogonadism in male   . DVT (deep venous thrombosis) ([place])     post op  . Nocturia   . Urinary frequency   . ED (erectile dysfunction)     Surgical History: Past Surgical History  Procedure Laterality Date  . Colonoscopy  2009    Home Medications:    Medication List       This list is accurate as of: 03/21/16  3:44 PM.  Always use your most recent med list.               bisoprolol-hydrochlorothiazide 5-6.25 MG tablet  Commonly known as:  ZIAC  Take 2 tablets by mouth daily.     CIALIS 5 MG tablet  Generic drug:  tadalafil  Take by mouth.     etodolac 500 MG tablet  Commonly known as:  LODINE  Reported on 11/30/2015     fluticasone 50 MCG/ACT nasal spray  Commonly known as:  FLONASE  instill 1 to 2 sprays  into each nostril once daily     metFORMIN 1000 MG tablet  Commonly known as:  GLUCOPHAGE  Take 1,000 mg by mouth 2 (two) times daily.     mirabegron ER 25 MG Tb24 tablet  Commonly known as:  MYRBETRIQ  Take 1 tablet (25 mg total) by mouth daily.     omeprazole 10 MG capsule  Commonly known as:  PRILOSEC  Take by mouth. Reported on 11/30/2015     Protein Powd  Take by mouth.     tamsulosin 0.4 MG Caps capsule  Commonly known as:  FLOMAX  Take 2 capsules (0.8 mg total) by mouth daily.     testosterone cypionate 200 MG/ML injection  Commonly known as:  DEPOTESTOSTERONE CYPIONATE        Allergies:  Allergies  Allergen Reactions  . Penicillins Hives    Family  History: Family History  Problem Relation Age of Onset  . Prostate cancer Father     Social History:  reports that he has never smoked. He does not have any smokeless tobacco history on file. He reports that he does not drink alcohol or use illicit drugs.  ROS:                                        Physical Exam: BP 151/91 mmHg  Pulse 83  Ht 6\' 2"  (1.88 m)  Wt 255 lb (115.667 kg)  BMI 32.73 kg/m2  Constitutional:  Alert and oriented, No acute distress. HEENT: [place] AT, moist mucus membranes.  Trachea midline, no masses. Cardiovascular: No clubbing, cyanosis, or edema. Respiratory: Normal respiratory effort, no increased work of breathing. GI: Abdomen is soft, nontender, nondistended, no abdominal masses GU: No CVA tenderness.  Skin: No rashes, bruises or suspicious lesions. Lymph: No cervical or inguinal adenopathy. Neurologic: Grossly intact, no focal deficits, moving all 4 extremities. Psychiatric: Normal mood and affect.  Laboratory Data: No results found for: WBC, HGB, HCT, MCV, PLT  No results found for: CREATININE  No results found for: PSA  No results found for: TESTOSTERONE  No results found for: HGBA1C  Urinalysis    Component Value Date/Time   APPEARANCEUR Clear 11/30/2015 1326   GLUCOSEU 2+* 11/30/2015 1326   BILIRUBINUR Negative 11/30/2015 1326   PROTEINUR 1+* 11/30/2015 1326   NITRITE Negative 11/30/2015 1326   LEUKOCYTESUR Negative 11/30/2015 1326     Assessment & Plan:    1. BPH with LUTS -increase flomax to 0.8 mg daily -Given his overactive bladder symptoms as well as his BPH that has not responded well to treatment, we will have him undergo urodynamic studies in [place]. He will follow-up with me after the studies are complete.  2. Urinary frequency/urgency -Discontinue Myrbetriq as this has not helped his urgency or frequency symptoms and caused a slight increase in his PVR.  3. Erectile Dysfunction -continue  cialis 5 mg daily  3. Hypogonadism -Will continue to follow-up for testosterone injections at primary care clinic  4. PSA Screening Up to date. Due for repeat PSA and DRE in March 2018.   No Follow-up on file.  [name], MD  [facility] [address], [place], [place] [address] [phone]

## 2016-05-06 ENCOUNTER — Other Ambulatory Visit: Payer: Self-pay | Admitting: Urology

## 2016-05-08 ENCOUNTER — Ambulatory Visit (INDEPENDENT_AMBULATORY_CARE_PROVIDER_SITE_OTHER): Payer: BLUE CROSS/BLUE SHIELD | Admitting: Urology

## 2016-05-08 ENCOUNTER — Encounter: Payer: Self-pay | Admitting: Urology

## 2016-05-08 VITALS — BP 135/88 | HR 82 | Ht 74.0 in | Wt 245.5 lb

## 2016-05-08 DIAGNOSIS — N400 Enlarged prostate without lower urinary tract symptoms: Secondary | ICD-10-CM | POA: Diagnosis not present

## 2016-05-08 MED ORDER — FINASTERIDE 5 MG PO TABS: 5.0000 mg | ORAL_TABLET | Freq: Every day | ORAL | Status: DC

## 2016-05-08 NOTE — Progress Notes (Signed)
05/08/2016 4:25 PM   Jonathan Fields 1956/04/24 [id number]  Referring provider: Shepard General, MD [facility] [place] [address] [place], [place] [address]  Chief Complaint  Patient presents with  . Follow-up    UDS     HPI: The patient is a 60 year old male possible history of ED and BPH presents for follow-up. He is currently on Cialis 5 mg and Flomax 0.4 mg. He still complains of frequency 15 today and nocturia 1-2 per night. He finds very bothersome and would like a medication to correct this. He returns with his erections, Cialis for the most part is able to provide erection hard enough for penetration. He is very happy with the erectile dysfunction medication. He also has hypogonadism on testosterone. This is managed by his primary care clinic.  December 2016 Interval History: The patient returns today for symptom at follow-up. Ventricular erectile dysfunction and Cialis is working well for him. His I PSS score today is 12/4. He has significant frequency urgency and nocturia. He does not have much problem with weak stream, straining, intermittency or incomplete emptying. His PVR today is 21. He does drink 3-4 bottles of water during the day. He stops drinking fluid around 8:00. He said that he is persistently up throughout the night urinating sometimes up to 4 times. The frequency is very bothersome to him. Even when he decrease his fluid intake, he continues to have frequency at the same right.  March 2017 Interval History: Due to his significant symptoms, his Flomax was increased to 0.8 mg daily, and he was started on Myrbetriq at his last visit. He notes no changes in his symptoms. However see PVR has increased to 144. His I PSS score today is 14/5. He has nocturia 1. His biggest complaint is urgency and frequency.  He is able to obtain good erection with Cialis 5 mg when necessary.  PSA March 2017: 2.09 March 2016 interval history: The patient  returns today to keep a voiding diary. He is voiding approximately every 2 hours during the day for volume 100 cc. This is unchanged. He is frustrated as his urgency and frequency and nocturia. He does have urge incontinence if he does not find his way to the bathroom when he gets the urge. He notes not changes since his last visit.   PMH: Past Medical History  Diagnosis Date  . Hyperlipemia   . Diabetes ([place])   . Sleep apnea   . Hypertension   . GERD (gastroesophageal reflux disease)   . Hypogonadism in male   . DVT (deep venous thrombosis) ([place])     post op  . Nocturia   . Urinary frequency   . ED (erectile dysfunction)     Surgical History: Past Surgical History  Procedure Laterality Date  . Colonoscopy  2009    Home Medications:    Medication List       This list is accurate as of: 05/08/16  4:25 PM.  Always use your most recent med list.               bisoprolol-hydrochlorothiazide 5-6.25 MG tablet  Commonly known as:  ZIAC  Take 2 tablets by mouth daily.     CIALIS 5 MG tablet  Generic drug:  tadalafil  Take by mouth.     doxycycline 100 MG capsule  Commonly known as:  VIBRAMYCIN  take 1 capsule by mouth twice a day for 10 days     etodolac 500 MG  tablet  Commonly known as:  LODINE  Reported on 11/30/2015     finasteride 5 MG tablet  Commonly known as:  PROSCAR  Take 1 tablet (5 mg total) by mouth daily.     fluticasone 50 MCG/ACT nasal spray  Commonly known as:  FLONASE  instill 1 to 2 sprays into each nostril once daily     metFORMIN 1000 MG tablet  Commonly known as:  GLUCOPHAGE  Take 1,000 mg by mouth 2 (two) times daily.     mirabegron ER 25 MG Tb24 tablet  Commonly known as:  MYRBETRIQ  Take 1 tablet (25 mg total) by mouth daily.     omeprazole 10 MG capsule  Commonly known as:  PRILOSEC  Take by mouth. Reported on 11/30/2015     Protein Powd  Take by mouth.     tamsulosin 0.4 MG Caps capsule  Commonly known as:  FLOMAX  Take 2  capsules (0.8 mg total) by mouth daily.     testosterone cypionate 200 MG/ML injection  Commonly known as:  DEPOTESTOSTERONE CYPIONATE        Allergies:  Allergies  Allergen Reactions  . Penicillins Hives    Family History: Family History  Problem Relation Age of Onset  . Prostate cancer Father     Social History:  reports that he has never smoked. He does not have any smokeless tobacco history on file. He reports that he does not drink alcohol or use illicit drugs.  ROS: UROLOGY Frequent Urination?: Yes Hard to postpone urination?: Yes Burning/pain with urination?: No Get up at night to urinate?: Yes Leakage of urine?: No Urine stream starts and stops?: Yes Trouble starting stream?: No Do you have to strain to urinate?: No Blood in urine?: No Urinary tract infection?: No Sexually transmitted disease?: No Injury to kidneys or bladder?: No Painful intercourse?: No Weak stream?: No Erection problems?: No Penile pain?: No  Gastrointestinal Nausea?: No Vomiting?: No Indigestion/heartburn?: No Diarrhea?: No Constipation?: No  Constitutional Fever: No Night sweats?: No Weight loss?: No Fatigue?: No  Skin Skin rash/lesions?: No Itching?: No  Eyes Blurred vision?: No Double vision?: No  Ears/Nose/Throat Sore throat?: No Sinus problems?: Yes  Hematologic/Lymphatic Swollen glands?: No Easy bruising?: No  Cardiovascular Leg swelling?: No Chest pain?: No  Respiratory Cough?: No Shortness of breath?: No  Endocrine Excessive thirst?: No  Musculoskeletal Back pain?: No Joint pain?: No  Neurological Headaches?: No Dizziness?: No  Psychologic Depression?: No Anxiety?: No  Physical Exam: BP 135/88 mmHg  Pulse 82  Ht 6\' 2"  (1.88 m)  Wt 245 lb 8 oz (111.358 kg)  BMI 31.51 kg/m2  Constitutional:  Alert and oriented, No acute distress. HEENT: [place] AT, moist mucus membranes.  Trachea midline, no masses. Cardiovascular: No clubbing, cyanosis,  or edema. Respiratory: Normal respiratory effort, no increased work of breathing. GI: Abdomen is soft, nontender, nondistended, no abdominal masses GU: No CVA tenderness.  Skin: No rashes, bruises or suspicious lesions. Lymph: No cervical or inguinal adenopathy. Neurologic: Grossly intact, no focal deficits, moving all 4 extremities. Psychiatric: Normal mood and affect.  Laboratory Data: No results found for: WBC, HGB, HCT, MCV, PLT  No results found for: CREATININE  No results found for: PSA  No results found for: TESTOSTERONE  No results found for: HGBA1C  Urinalysis    Component Value Date/Time   APPEARANCEUR Clear 03/21/2016 1521   GLUCOSEU Negative 03/21/2016 1521   BILIRUBINUR Negative 03/21/2016 1521   PROTEINUR Trace* 03/21/2016 1521   NITRITE  Negative 03/21/2016 1521   LEUKOCYTESUR Negative 03/21/2016 1521     Assessment & Plan:    The patient's urodynamic studies show a first sensation was at a relatively low vital 159 cc. He has very obstructive flow of only 5 mL per second. His problem likely lyse the level his prostate as he is using generator large bladder pressures for relatively slow flow rate.  We discussed possible options for him to allow him to increase his flow rate without increasing his detrusor pressure so much. These include adding finasteride to go to shrink his prostate over the course of the next few months. We also briefly discussed surgery which include transurethral resection of prostate or laser ablation of prostate. I do not think he would be good candidate for laser ablation his DRE has urgency and frequency and this is a known short-term side effects of this procedure. His lites undergo finasteride being added to his medication regimen. If this does not work I think he will need to discuss consider TURP. We discussed this in great detail.   H1. BPH with LUTS -increase flomax to 0.8 mg daily -add finasteride 5 mg daily  2. Urinary  frequency/urgency -Likely 2/2 above  3. Erectile Dysfunction -continue cialis 5 mg daily  3. Hypogonadism -Will continue to follow-up for testosterone injections at primary care clinic  4. PSA Screening Up to date. Due for repeat PSA and DRE in March 2018.  Return in about 3 months (around 08/08/2016).  [name], MD  [facility] [address] [place], [place] [address] [phone]

## 2016-05-09 ENCOUNTER — Ambulatory Visit: Payer: BLUE CROSS/BLUE SHIELD

## 2016-07-11 ENCOUNTER — Ambulatory Visit (INDEPENDENT_AMBULATORY_CARE_PROVIDER_SITE_OTHER): Payer: BLUE CROSS/BLUE SHIELD

## 2016-07-11 ENCOUNTER — Telehealth: Payer: Self-pay

## 2016-07-11 VITALS — BP 122/85 | HR 99 | Wt 245.3 lb

## 2016-07-11 DIAGNOSIS — N390 Urinary tract infection, site not specified: Secondary | ICD-10-CM

## 2016-07-11 LAB — URINALYSIS, COMPLETE
Bilirubin, UA: NEGATIVE
Glucose, UA: NEGATIVE
Ketones, UA: NEGATIVE
Nitrite, UA: NEGATIVE
Specific Gravity, UA: 1.02 (ref 1.005–1.030)
Urobilinogen, Ur: 0.2 mg/dL (ref 0.2–1.0)
pH, UA: 5 (ref 5.0–7.5)

## 2016-07-11 LAB — MICROSCOPIC EXAMINATION: Epithelial Cells (non renal): NONE SEEN /hpf (ref 0–10)

## 2016-07-11 LAB — BLADDER SCAN AMB NON-IMAGING: Scan Result: 70

## 2016-07-11 MED ORDER — SULFAMETHOXAZOLE-TRIMETHOPRIM 800-160 MG PO TABS: 1.0000 | ORAL_TABLET | Freq: Two times a day (BID) | ORAL | 0 refills | Status: DC

## 2016-07-11 NOTE — Telephone Encounter (Signed)
Pt called stating he thinks he has another UTI and wanted to be seen today. Pt described UTI s/s to be urinary frequency and up all night only able to urinate drops. Pt was added to nurse schedule today.

## 2016-07-11 NOTE — Progress Notes (Addendum)
Pt came in today with c/o chills, sweating, cloudy urine, foul smelling urine, increased frequency, and only able to urinate drops. Clean catch specimen was sent for u/a and cx. PVR-70. Per Dr. Pilar Jarvis pt was started on bactrim ds bid x7 days. Made pt aware medication was sent to pharmacy. Pt voiced understanding.

## 2016-07-11 NOTE — Addendum Note (Signed)
Addended by: Toniann Fail C on: 07/11/2016 10:32 AM   Modules accepted: Orders

## 2016-07-14 LAB — CULTURE, URINE COMPREHENSIVE

## 2016-08-13 ENCOUNTER — Ambulatory Visit (INDEPENDENT_AMBULATORY_CARE_PROVIDER_SITE_OTHER): Payer: BLUE CROSS/BLUE SHIELD | Admitting: Urology

## 2016-08-13 ENCOUNTER — Encounter: Payer: Self-pay | Admitting: Urology

## 2016-08-13 VITALS — BP 148/80 | HR 90 | Ht 74.0 in | Wt 250.8 lb

## 2016-08-13 DIAGNOSIS — N400 Enlarged prostate without lower urinary tract symptoms: Secondary | ICD-10-CM | POA: Diagnosis not present

## 2016-08-13 LAB — URINALYSIS, COMPLETE
Bilirubin, UA: NEGATIVE
Leukocytes, UA: NEGATIVE
Nitrite, UA: NEGATIVE
Protein, UA: NEGATIVE
RBC, UA: NEGATIVE
Specific Gravity, UA: 1.025 (ref 1.005–1.030)
Urobilinogen, Ur: 0.2 mg/dL (ref 0.2–1.0)
pH, UA: 6 (ref 5.0–7.5)

## 2016-08-13 LAB — MICROSCOPIC EXAMINATION: Bacteria, UA: NONE SEEN

## 2016-08-13 LAB — BLADDER SCAN AMB NON-IMAGING: Scan Result: 15

## 2016-08-13 NOTE — Progress Notes (Signed)
08/13/2016 9:24 AM   Jonathan Fields 1956-09-30 [id number]  Referring provider: Shepard General, MD [facility] [place] [address] [place], [place] [address]  Chief Complaint  Patient presents with  . Follow-up    BPH    HPI: The patient is a 60 year old male possible history of ED and BPH presents for follow-up. He is currently on Cialis 5 mg and Flomax 0.4 mg. He still complains of frequency 15 today and nocturia 1-2 per night. He finds very bothersome and would like a medication to correct this. He returns with his erections, Cialis for the most part is able to provide erection hard enough for penetration. He is very happy with the erectile dysfunction medication. He also has hypogonadism on testosterone. This is managed by his primary care clinic.  December 2016 Interval History: The patient returns today for symptom at follow-up. Ventricular erectile dysfunction and Cialis is working well for him. His I PSS score today is 12/4. He has significant frequency urgency and nocturia. He does not have much problem with weak stream, straining, intermittency or incomplete emptying. His PVR today is 21. He does drink 3-4 bottles of water during the day. He stops drinking fluid around 8:00. He said that he is persistently up throughout the night urinating sometimes up to 4 times. The frequency is very bothersome to him. Even when he decrease his fluid intake, he continues to have frequency at the same right.  March 2017 Interval History: Due to his significant symptoms, his Flomax was increased to 0.8 mg daily, and he was started on Myrbetriq at his last visit. He notes no changes in his symptoms. However see PVR has increased to 144. His I PSS score today is 14/5. He has nocturia 1. His biggest complaint is urgency and frequency.  He is able to obtain good erection with Cialis 5 mg when necessary.  PSA March 2017: 2.09 March 2016 interval history: The patient  returns today to keep a voiding diary. He is voiding approximately every 2 hours during the day for volume 100 cc. This is unchanged. He is frustrated as his urgency and frequency and nocturia. He does have urge incontinence if he does not find his way to the bathroom when he gets the urge. He notes not changes since his last visit..  June 2017 interval history: UDS showed decreased flow rate of only 5 resected. There is a pressure maximum flow was 36 cm of water. Maximum detrusor pressure was 78 cm of water. Postvoid residual is 39 cc. He also had increased EMG activity due to poor relaxation of the external sphincter during voiding.  September 2017 interval history: He is currently on flomax 0.8 mg daily and finasteride. He has noticed some no change in his voiding patterns. He still has nocturia 5. His frequency every hour. His significant urgency. 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 He has to stop frequently to urinate.   IPSS: 20/5     PMH: Past Medical History:  Diagnosis Date  . Diabetes ([place])   . DVT (deep venous thrombosis) ([place])    post op  . ED (erectile dysfunction)   . GERD (gastroesophageal reflux disease)   . Hyperlipemia   . Hypertension   . Hypogonadism in male   . Nocturia   . Sleep apnea   . Urinary frequency     Surgical History: Past Surgical History:  Procedure  Laterality Date  . COLONOSCOPY  2009    Home Medications:    Medication List       Accurate as of 08/13/16  9:24 AM. Always use your most recent med list.          bisoprolol-hydrochlorothiazide 5-6.25 MG tablet Commonly known as:  ZIAC Take 2 tablets by mouth daily.   CIALIS 5 MG tablet Generic drug:  tadalafil Take by mouth.   etodolac 500 MG tablet Commonly known as:  LODINE Reported on 11/30/2015   finasteride 5 MG tablet Commonly known as:  PROSCAR Take 1 tablet (5 mg total) by mouth daily.     fluticasone 50 MCG/ACT nasal spray Commonly known as:  FLONASE instill 1 to 2 sprays into each nostril once daily   metFORMIN 1000 MG tablet Commonly known as:  GLUCOPHAGE Take 1,000 mg by mouth 2 (two) times daily.   mirabegron ER 25 MG Tb24 tablet Commonly known as:  MYRBETRIQ Take 1 tablet (25 mg total) by mouth daily.   omeprazole 10 MG capsule Commonly known as:  PRILOSEC Take by mouth. Reported on 11/30/2015   Protein Powd Take by mouth.   tamsulosin 0.4 MG Caps capsule Commonly known as:  FLOMAX Take 2 capsules (0.8 mg total) by mouth daily.   testosterone cypionate 200 MG/ML injection Commonly known as:  DEPOTESTOSTERONE CYPIONATE       Allergies:  Allergies  Allergen Reactions  . Penicillins Hives    Family History: Family History  Problem Relation Age of Onset  . Prostate cancer Father     Social History:  reports that he has never smoked. He does not have any smokeless tobacco history on file. He reports that he does not drink alcohol or use drugs.  ROS: UROLOGY Frequent Urination?: Yes Hard to postpone urination?: Yes Burning/pain with urination?: No Get up at night to urinate?: Yes Leakage of urine?: Yes Urine stream starts and stops?: No Trouble starting stream?: No Do you have to strain to urinate?: No Blood in urine?: No Urinary tract infection?: Yes Sexually transmitted disease?: No Injury to kidneys or bladder?: No Painful intercourse?: No Weak stream?: No Erection problems?: Yes Penile pain?: No  Gastrointestinal Nausea?: No Vomiting?: No Indigestion/heartburn?: No Diarrhea?: No Constipation?: No  Constitutional Fever: No Night sweats?: No Weight loss?: No Fatigue?: No  Skin Skin rash/lesions?: No Itching?: No  Eyes Blurred vision?: No Double vision?: Yes  Ears/Nose/Throat Sore throat?: No Sinus problems?: Yes  Hematologic/Lymphatic Swollen glands?: No Easy bruising?: No  Cardiovascular Leg swelling?:  No Chest pain?: No  Respiratory Cough?: No Shortness of breath?: No  Endocrine Excessive thirst?: No  Musculoskeletal Back pain?: No Joint pain?: No  Neurological Headaches?: No Dizziness?: No  Psychologic Depression?: No Anxiety?: No  Physical Exam: BP (!) 148/80   Pulse 90   Ht 6\' 2"  (1.88 m)   Wt 250 lb 12.8 oz (113.8 kg)   BMI 32.20 kg/m   Constitutional:  Alert and oriented, No acute distress. HEENT: [place] AT, moist mucus membranes.  Trachea midline, no masses. Cardiovascular: No clubbing, cyanosis, or edema. Respiratory: Normal respiratory effort, no increased work of breathing. GI: Abdomen is soft, nontender, nondistended, no abdominal masses GU: No CVA tenderness.  Skin: No rashes, bruises or suspicious lesions. Lymph: No cervical or inguinal adenopathy. Neurologic: Grossly intact, no focal deficits, moving all 4 extremities. Psychiatric: Normal mood and affect.  Laboratory Data: No results found for: WBC, HGB, HCT, MCV, PLT  No results found for: CREATININE  No  results found for: PSA  No results found for: TESTOSTERONE  No results found for: HGBA1C  Urinalysis    Component Value Date/Time   APPEARANCEUR Cloudy (A) 07/11/2016 1023   GLUCOSEU Negative 07/11/2016 1023   BILIRUBINUR Negative 07/11/2016 1023   PROTEINUR 1+ (A) 07/11/2016 1023   NITRITE Negative 07/11/2016 1023   LEUKOCYTESUR 3+ (A) 07/11/2016 1023     Assessment & Plan:    1. BPH (benign prostatic hyperplasia) The patient's urinary symptoms his urodynamic studies are likely secondary to his enlarged prostate. He has failed medical therapy as he is on maximum medical management. He'll continue his Flomax 0 point milligrams daily and finasteride 5 mg daily for now. We will get him back for cystoscopy in order for preoperative planning for possible TURP.   Return in about 2 weeks (around 08/27/2016) for cystoscopy.  [name], MD  [facility] [address] [place], [place] [address] [phone]

## 2016-08-27 ENCOUNTER — Other Ambulatory Visit: Payer: Self-pay | Admitting: Orthopedic Surgery

## 2016-08-27 DIAGNOSIS — M25511 Pain in right shoulder: Secondary | ICD-10-CM

## 2016-09-09 ENCOUNTER — Ambulatory Visit: Payer: Self-pay

## 2016-09-11 ENCOUNTER — Ambulatory Visit (INDEPENDENT_AMBULATORY_CARE_PROVIDER_SITE_OTHER): Payer: BLUE CROSS/BLUE SHIELD | Admitting: Urology

## 2016-09-11 ENCOUNTER — Ambulatory Visit
Admission: RE | Admit: 2016-09-11 | Discharge: 2016-09-11 | Disposition: A | Discharge status: Home / Self Care | Payer: BLUE CROSS/BLUE SHIELD | Source: Ambulatory Visit | Attending: Orthopedic Surgery | Admitting: Orthopedic Surgery

## 2016-09-11 ENCOUNTER — Encounter: Payer: Self-pay | Admitting: Urology

## 2016-09-11 VITALS — BP 158/99 | HR 74 | Ht 74.0 in | Wt 251.2 lb

## 2016-09-11 DIAGNOSIS — N400 Enlarged prostate without lower urinary tract symptoms: Secondary | ICD-10-CM

## 2016-09-11 DIAGNOSIS — M19011 Primary osteoarthritis, right shoulder: Secondary | ICD-10-CM | POA: Insufficient documentation

## 2016-09-11 DIAGNOSIS — M75121 Complete rotator cuff tear or rupture of right shoulder, not specified as traumatic: Secondary | ICD-10-CM | POA: Diagnosis present

## 2016-09-11 DIAGNOSIS — M7551 Bursitis of right shoulder: Secondary | ICD-10-CM | POA: Insufficient documentation

## 2016-09-11 DIAGNOSIS — M62511 Muscle wasting and atrophy, not elsewhere classified, right shoulder: Secondary | ICD-10-CM | POA: Insufficient documentation

## 2016-09-11 DIAGNOSIS — M25511 Pain in right shoulder: Secondary | ICD-10-CM

## 2016-09-11 LAB — URINALYSIS, COMPLETE
Bilirubin, UA: NEGATIVE
Glucose, UA: NEGATIVE
Leukocytes, UA: NEGATIVE
Nitrite, UA: NEGATIVE
RBC, UA: NEGATIVE
Specific Gravity, UA: 1.025 (ref 1.005–1.030)
Urobilinogen, Ur: 0.2 mg/dL (ref 0.2–1.0)
pH, UA: 5.5 (ref 5.0–7.5)

## 2016-09-11 LAB — MICROSCOPIC EXAMINATION: Bacteria, UA: NONE SEEN

## 2016-09-11 MED ORDER — CIPROFLOXACIN HCL 500 MG PO TABS
500.0000 mg | ORAL_TABLET | Freq: Once | ORAL | Status: AC
  Administered 2016-09-11: 500 mg via ORAL

## 2016-09-11 MED ORDER — LIDOCAINE HCL 2 % EX GEL
1.0000 "application " | Freq: Once | CUTANEOUS | Status: AC
  Administered 2016-09-11: 1 via URETHRAL

## 2016-09-11 NOTE — Progress Notes (Signed)
09/11/2016 9:57 AM   Rolland Porter Raineri 04/14/1956 [id number]  Referring provider: Shepard General, MD [facility] [place] [address] [place],  [address]  Chief Complaint  Patient presents with  . Cysto    BPH    HPI: The patient is a 60 year old male possible history of ED and BPH presents for follow-up. He is currently on Cialis 5 mg and Flomax 0.4 mg. He still complains of frequency 15 today and nocturia 1-2 per night. He finds very bothersome and would like a medication to correct this. He returns with his erections, Cialis for the most part is able to provide erection hard enough for penetration. He is very happy with the erectile dysfunction medication. He also has hypogonadism on testosterone. This is managed by his primary care clinic.  December 2016 Interval History: The patient returns today for symptom at follow-up. Ventricular erectile dysfunction and Cialis is working well for him. His I PSS score today is 12/4. He has significant frequency urgency and nocturia. He does not have much problem with weak stream, straining, intermittency or incomplete emptying. His PVR today is 21. He does drink 3-4 bottles of water during the day. He stops drinking fluid around 8:00. He said that he is persistently up throughout the night urinating sometimes up to 4 times. The frequency is very bothersome to him. Even when he decrease his fluid intake, he continues to have frequency at the same right.  March 2017 Interval History: Due to his significant symptoms, his Flomax was increased to 0.8 mg daily, and he was started on Myrbetriq at his last visit. He notes no changes in his symptoms. However see PVR has increased to 144. His I PSS score today is 14/5. He has nocturia 1. His biggest complaint is urgency and frequency.  He is able to obtain good erection with Cialis 5 mg when necessary.  PSA March 2017: 2.09 March 2016 interval history: The patient  returns today to keep a voiding diary. He is voiding approximately every 2 hours during the day for volume 100 cc. This is unchanged. He is frustrated as his urgency and frequency and nocturia. He does have urge incontinence if he does not find his way to the bathroom when he gets the urge. He notes not changes since his last visit..  June 2017 interval history: UDS showed decreased flow rate of only 5 . There is a pressure maximum flow was 36 cm of water. Maximum detrusor pressure was 78 cm of water. Postvoid residual is 39 cc. He also had increased EMG activity due to poor relaxation of the external sphincter during voiding.  September 2017 interval history: He is currently on flomax 0.8 mg daily and finasteride. He has noticed some no change in his voiding patterns. He still has nocturia 5. His frequency every hour. His significant urgency. 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 He has to stop frequently to urinate.   IPSS: 20/04 September 2016 interval history The patient's wishes Flomax 0.8 mg taken once daily instead of split in the dose twice a day. He's noticed a dramatic improvement in symptoms. His daytime frequency is dramatically improved. His nocturia is now once per night and said of 3-4 times per night. He is happy with the improvement.   PMH: Past Medical History:  Diagnosis Date  . Diabetes ([place])   . DVT (deep venous  thrombosis) ([place])    post op  . ED (erectile dysfunction)   . GERD (gastroesophageal reflux disease)   . Hyperlipemia   . Hypertension   . Hypogonadism in male   . Nocturia   . Sleep apnea   . Urinary frequency     Surgical History: Past Surgical History:  Procedure Laterality Date  . COLONOSCOPY  2009    Home Medications:    Medication List       Accurate as of 09/11/16  9:57 AM. Always use your most recent med list.          bisoprolol-hydrochlorothiazide 5-6.25  MG tablet Commonly known as:  ZIAC Take 2 tablets by mouth daily.   CIALIS 5 MG tablet Generic drug:  tadalafil Take by mouth.   etodolac 500 MG tablet Commonly known as:  LODINE Reported on 11/30/2015   finasteride 5 MG tablet Commonly known as:  PROSCAR Take 1 tablet (5 mg total) by mouth daily.   fluticasone 50 MCG/ACT nasal spray Commonly known as:  FLONASE instill 1 to 2 sprays into each nostril once daily   metFORMIN 1000 MG tablet Commonly known as:  GLUCOPHAGE Take 1,000 mg by mouth 2 (two) times daily.   mirabegron ER 25 MG Tb24 tablet Commonly known as:  MYRBETRIQ Take 1 tablet (25 mg total) by mouth daily.   omeprazole 10 MG capsule Commonly known as:  PRILOSEC Take by mouth. Reported on 11/30/2015   Protein Powd Take by mouth.   tamsulosin 0.4 MG Caps capsule Commonly known as:  FLOMAX Take 2 capsules (0.8 mg total) by mouth daily.   testosterone cypionate 200 MG/ML injection Commonly known as:  DEPOTESTOSTERONE CYPIONATE       Allergies:  Allergies  Allergen Reactions  . Penicillins Hives    Family History: Family History  Problem Relation Age of Onset  . Prostate cancer Father     Social History:  reports that he has never smoked. He does not have any smokeless tobacco history on file. He reports that he does not drink alcohol or use drugs.  ROS:                                        Physical Exam: BP (!) 158/99   Pulse 74   Ht 6\' 2"  (1.88 m)   Wt 251 lb 3.2 oz (113.9 kg)   BMI 32.25 kg/m   Constitutional:  Alert and oriented, No acute distress. HEENT: [place] AT, moist mucus membranes.  Trachea midline, no masses. Cardiovascular: No clubbing, cyanosis, or edema. Respiratory: Normal respiratory effort, no increased work of breathing. GI: Abdomen is soft, nontender, nondistended, no abdominal masses GU: No CVA tenderness.  Skin: No rashes, bruises or suspicious lesions. Lymph: No cervical or inguinal  adenopathy. Neurologic: Grossly intact, no focal deficits, moving all 4 extremities. Psychiatric: Normal mood and affect.  Laboratory Data: No results found for: WBC, HGB, HCT, MCV, PLT  No results found for: CREATININE  No results found for: PSA  No results found for: TESTOSTERONE  No results found for: HGBA1C  Urinalysis    Component Value Date/Time   APPEARANCEUR Clear 08/13/2016 0835   GLUCOSEU Trace (A) 08/13/2016 0835   BILIRUBINUR Negative 08/13/2016 0835   PROTEINUR Negative 08/13/2016 0835   NITRITE Negative 08/13/2016 0835   LEUKOCYTESUR Negative 08/13/2016 0835     Cystoscopy Procedure Note  Patient identification was confirmed,  informed consent was obtained, and patient was prepped using Betadine solution.  Lidocaine jelly was administered per urethral meatus.    Preoperative abx where received prior to procedure.     Pre-Procedure: - Inspection reveals a normal caliber ureteral meatus.  Procedure: The flexible cystoscope was introduced without difficulty - No urethral strictures/lesions are present. - Enlarged prostate Visually obstructive - Normal bladder neck - Bilateral ureteral orifices identified - No bladder stones - No trabeculation   Post-Procedure: - Patient tolerated the procedure well   Assessment & Plan:    1. BPH I had a long discussion with the patient regarding his visually obstructive prostate. He has failed medical therapy for BPH. I discussed the next that would be reduction of his prostate volume. Transurethral resection of prostate. We discussed the risks, benefits, indications of this procedure. He understands that he would be in the hospital overnight. He understands the risks include bleeding, incontinence, infection, nonresolution of symptoms, need for repeat procedures, Foley catheter after procedure, admission to the hospital. He also understands that he had some signs of external sphincter hyperactivity during voiding which  decided she was restricted dyssynergia. This may also be completely contributing to his prominent TURP will not affect this problem. I do think that given how significant his BPH is a reduction in size may help with his urinary symptoms.  However for now, he is happy with his symptoms and started to improve taking the Flomax 0.8 mg at one time. He would like to hold off on surgery for now. He'll follow-up in 3 months. If his symptoms worsen or he becomes more bothered by his urinary symptoms before that time, he'll call the office and we'll schedule him for TURP at that time over the phone.  2. Hypogonadism -Continue testosterone per PCP.  [name], MD  [facility] [address], [place], [place] [address] [phone]

## 2016-09-15 ENCOUNTER — Other Ambulatory Visit: Payer: Self-pay

## 2016-12-11 ENCOUNTER — Ambulatory Visit: Payer: BLUE CROSS/BLUE SHIELD | Admitting: Urology

## 2016-12-11 ENCOUNTER — Encounter: Payer: Self-pay | Admitting: Urology

## 2016-12-11 VITALS — BP 144/79 | HR 81 | Ht 74.0 in | Wt 255.7 lb

## 2016-12-11 DIAGNOSIS — N400 Enlarged prostate without lower urinary tract symptoms: Secondary | ICD-10-CM | POA: Diagnosis not present

## 2016-12-11 NOTE — Progress Notes (Signed)
12/11/2016 9:32 AM   Jonathan Fields July 28, 1956 [id number]  Referring provider: Shepard General, MD [facility] [place] [address] [place], [place] [address]  Chief Complaint  Patient presents with  . Benign Prostatic Hypertrophy    HPI: The patient is a 61 year old male possible history of ED and BPH presents for follow-up. He is currently on Cialis 5 mg and Flomax 0.4 mg. He still complains of frequency 15 today and nocturia 1-2 per night. He finds very bothersome and would like a medication to correct this. He returns with his erections, Cialis for the most part is able to provide erection hard enough for penetration. He is very happy with the erectile dysfunction medication. He also has hypogonadism on testosterone. This is managed by his primary care clinic.  December 2016 Interval History: The patient returns today for symptom at follow-up. Ventricular erectile dysfunction and Cialis is working well for him. His I PSS score today is 12/4. He has significant frequency urgency and nocturia. He does not have much problem with weak stream, straining, intermittency or incomplete emptying. His PVR today is 21. He does drink 3-4 bottles of water during the day. He stops drinking fluid around 8:00. He said that he is persistently up throughout the night urinating sometimes up to 4 times. The frequency is very bothersome to him. Even when he decrease his fluid intake, he continues to have frequency at the same right.  March 2017 Interval History: Due to his significant symptoms, his Flomax was increased to 0.8 mg daily, and he was started on Myrbetriq at his last visit. He notes no changes in his symptoms. However see PVR has increased to 144. His I PSS score today is 14/5. He has nocturia 1. His biggest complaint is urgency and frequency.  He is able to obtain good erection with Cialis 5 mg when necessary.  PSA March 2017: 2.09 March 2016 interval  history: The patient returns today to keep a voiding diary. He is voiding approximately every 2 hours during the day for volume 100 cc. This is unchanged. He is frustrated as his urgency and frequency and nocturia. He does have urge incontinence if he does not find his way to the bathroom when he gets the urge. He notes not changes since his last visit..  June 2017 interval history: UDS showed decreased flow rate of only 5 . There is a pressure maximum flow was 36 cm of water. Maximum detrusor pressure was 78 cm of water. Postvoid residual is 39 cc. He also had increased EMG activity due to poor relaxation of the external sphincter during voiding.  September 2017 interval history: He is currently on flomax 0.8 mg daily and finasteride.He has noticed some no change in his voiding patterns. He still has nocturia 5. His frequency every hour. His significant urgency. 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 He has tostop frequently to urinate.   IPSS: 20/04 September 2016 interval history The patient's wishes Flomax 0.8 mg taken once daily instead of split in the dose twice a day. He's noticed a dramatic improvement in symptoms. His daytime frequency is dramatically improved. His nocturia is now once per night and said of 3-4 times per night. He is happy with the improvement.    January 2018 interval history: At his last appointment, we discussed a TURP given his urodynamics studies findings as well as visually obstructing  prostate on cystoscopy. He was happy with his symptoms and was not interested in surgery that time. He currently has nocturia 2. His frequency is once every 2 hours or so. He has a weak stream. He is debating whether to undergo surgery at this time via TURP. 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   PMH: Past Medical  History:  Diagnosis Date  . Diabetes ([place])   . DVT (deep venous thrombosis) ([place])    post op  . ED (erectile dysfunction)   . GERD (gastroesophageal reflux disease)   . Hyperlipemia   . Hypertension   . Hypogonadism in male   . Nocturia   . Sleep apnea   . Urinary frequency     Surgical History: Past Surgical History:  Procedure Laterality Date  . COLONOSCOPY  2009    Home Medications:  Allergies as of 12/11/2016      Reactions   Penicillins Hives      Medication List       Accurate as of 12/11/16  9:32 AM. Always use your most recent med list.          bisoprolol-hydrochlorothiazide 5-6.25 MG tablet Commonly known as:  ZIAC Take 2 tablets by mouth daily.   CIALIS 5 MG tablet Generic drug:  tadalafil Take by mouth.   etodolac 500 MG tablet Commonly known as:  LODINE Reported on 11/30/2015   finasteride 5 MG tablet Commonly known as:  PROSCAR Take 1 tablet (5 mg total) by mouth daily.   fluticasone 50 MCG/ACT nasal spray Commonly known as:  FLONASE instill 1 to 2 sprays into each nostril once daily   metFORMIN 1000 MG tablet Commonly known as:  GLUCOPHAGE Take 1,000 mg by mouth 2 (two) times daily.   mirabegron ER 25 MG Tb24 tablet Commonly known as:  MYRBETRIQ Take 1 tablet (25 mg total) by mouth daily.   omeprazole 10 MG capsule Commonly known as:  PRILOSEC Take by mouth. Reported on 11/30/2015   Protein Powd Take by mouth.   tamsulosin 0.4 MG Caps capsule Commonly known as:  FLOMAX Take 2 capsules (0.8 mg total) by mouth daily.   testosterone cypionate 200 MG/ML injection Commonly known as:  DEPOTESTOSTERONE CYPIONATE       Allergies:  Allergies  Allergen Reactions  . Penicillins Hives    Family History: Family History  Problem Relation Age of Onset  . Prostate cancer Father     Social History:  reports that he has never smoked. His smokeless tobacco use includes Chew. He reports that he does not drink alcohol or use  drugs.  ROS: UROLOGY Frequent Urination?: Yes Hard to postpone urination?: Yes Burning/pain with urination?: No Get up at night to urinate?: Yes Leakage of urine?: Yes Urine stream starts and stops?: No Trouble starting stream?: No Do you have to strain to urinate?: No Blood in urine?: No Urinary tract infection?: No Sexually transmitted disease?: No Injury to kidneys or bladder?: No Painful intercourse?: No Weak stream?: No Erection problems?: No Penile pain?: No  Gastrointestinal Nausea?: No Vomiting?: No Indigestion/heartburn?: No Diarrhea?: No Constipation?: No  Constitutional Fever: No Night sweats?: No Weight loss?: No Fatigue?: No  Skin Skin rash/lesions?: No Itching?: No  Eyes Blurred vision?: No Double vision?: No  Ears/Nose/Throat Sore throat?: No Sinus problems?: Yes  Hematologic/Lymphatic Swollen glands?: No Easy bruising?: No  Cardiovascular Leg swelling?: No Chest pain?: No  Respiratory Cough?:  No Shortness of breath?: No  Endocrine Excessive thirst?: No  Musculoskeletal Back pain?: No Joint pain?: No  Neurological Headaches?: No Dizziness?: No  Psychologic Depression?: No Anxiety?: No  Physical Exam: BP (!) 144/79 (BP Location: Left Arm, Patient Position: Sitting, Cuff Size: Large)   Pulse 81   Ht 6\' 2"  (1.88 m)   Wt 255 lb 11.2 oz (116 kg)   BMI 32.83 kg/m   Constitutional:  Alert and oriented, No acute distress. HEENT: [place] AT, moist mucus membranes.  Trachea midline, no masses. Cardiovascular: No clubbing, cyanosis, or edema. Respiratory: Normal respiratory effort, no increased work of breathing. GI: Abdomen is soft, nontender, nondistended, no abdominal masses GU: No CVA tenderness.  Skin: No rashes, bruises or suspicious lesions. Lymph: No cervical or inguinal adenopathy. Neurologic: Grossly intact, no focal deficits, moving all 4 extremities. Psychiatric: Normal mood and affect.  Laboratory Data: No results  found for: WBC, HGB, HCT, MCV, PLT  No results found for: CREATININE  No results found for: PSA  No results found for: TESTOSTERONE  No results found for: HGBA1C  Urinalysis    Component Value Date/Time   APPEARANCEUR Clear 09/11/2016 0907   GLUCOSEU Negative 09/11/2016 0907   BILIRUBINUR Negative 09/11/2016 0907   PROTEINUR Trace (A) 09/11/2016 0907   NITRITE Negative 09/11/2016 0907   LEUKOCYTESUR Negative 09/11/2016 0907     Assessment & Plan:     1. 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 We discussed the risks, benefits, indications of this. We discussed bleeding, infection, retrograde ejaculation as possible side effects. 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 We'll have him follow-up in April 2018 to schedule an further discuss surgery in the early summer months.  2. Hypogonadism -Continue testosterone per PCP.  3. Prostate cancer screening Due for PSA at next visit (last PSA was in March 2017).  Return in about 3 months (around 03/11/2017).  [name], MD  [facility] [address], [place], [place] [address] [phone]

## 2017-02-16 ENCOUNTER — Ambulatory Visit (INDEPENDENT_AMBULATORY_CARE_PROVIDER_SITE_OTHER): Payer: Self-pay | Admitting: Vascular Surgery

## 2017-03-12 ENCOUNTER — Ambulatory Visit: Payer: BLUE CROSS/BLUE SHIELD

## 2017-03-19 ENCOUNTER — Ambulatory Visit: Payer: BLUE CROSS/BLUE SHIELD

## 2017-03-23 ENCOUNTER — Other Ambulatory Visit: Payer: Self-pay

## 2017-03-23 DIAGNOSIS — N401 Enlarged prostate with lower urinary tract symptoms: Secondary | ICD-10-CM

## 2017-03-23 MED ORDER — TAMSULOSIN HCL 0.4 MG PO CAPS: 0.8000 mg | ORAL_CAPSULE | Freq: Every day | ORAL | 11 refills | Status: DC

## 2017-03-26 ENCOUNTER — Encounter: Payer: Self-pay | Admitting: Urology

## 2017-03-26 ENCOUNTER — Ambulatory Visit (INDEPENDENT_AMBULATORY_CARE_PROVIDER_SITE_OTHER): Payer: BLUE CROSS/BLUE SHIELD | Admitting: Urology

## 2017-03-26 VITALS — BP 134/72 | HR 83 | Ht 74.0 in | Wt 253.3 lb

## 2017-03-26 DIAGNOSIS — N400 Enlarged prostate without lower urinary tract symptoms: Secondary | ICD-10-CM

## 2017-03-26 DIAGNOSIS — Z125 Encounter for screening for malignant neoplasm of prostate: Secondary | ICD-10-CM

## 2017-03-26 LAB — MICROSCOPIC EXAMINATION
Bacteria, UA: NONE SEEN
RBC, UA: NONE SEEN /hpf (ref 0–?)

## 2017-03-26 LAB — URINALYSIS, COMPLETE
Bilirubin, UA: NEGATIVE
Leukocytes, UA: NEGATIVE
Nitrite, UA: NEGATIVE
RBC, UA: NEGATIVE
Specific Gravity, UA: 1.02 (ref 1.005–1.030)
Urobilinogen, Ur: 0.2 mg/dL (ref 0.2–1.0)
pH, UA: 5 (ref 5.0–7.5)

## 2017-03-26 NOTE — Progress Notes (Signed)
03/26/2017 9:44 AM   Jonathan Fields 08-07-1956 [id number]  Referring provider: Shepard General, MD [facility] [place] [address] [place], [place] [address]  Chief Complaint  Patient presents with  . Benign Prostatic Hypertrophy    HPI: The patient is a 61 year old gentleman with a past medical history significant for BPH which is poorly controlled on maximal medical therapy presents today to discuss a TURP.  1. BPH He is currently on finasteride and Flomax 0.8 mg daily. He is also on Myrbetriq for his urgency symptoms. 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 He has nocturia 3. His significant urgency. He has frequency once per hour. He also feels that he doesn't empty his bladder. He is very bothered by these symptoms.  UDS showed decreased flow rate of only 5 . The pressure at maximum flow was 36 cm of water. Maximum detrusor pressure was 78 cm of water. Postvoid residual is 39 cc. He also had increased EMG activity due to poor relaxation of the external sphincter during voiding.    Cystoscopy showed visually obstructive prostate.  2. Hypogonadism He also has a history of hypogonadism. This is managed by his primary care provider with testosterone injections.  3. Prostate cancer screening -Due for PSA/DRE today  PMH: Past Medical History:  Diagnosis Date  . Diabetes ([place])   . DVT (deep venous thrombosis) ([place])    post op  . ED (erectile dysfunction)   . GERD (gastroesophageal reflux disease)   . Hyperlipemia   . Hypertension   . Hypogonadism in male   . Nocturia   . Sleep apnea   . Urinary frequency     Surgical History: Past Surgical History:  Procedure Laterality Date  . COLONOSCOPY  2009    Home Medications:  Allergies as of 03/26/2017      Reactions   Penicillins Hives      Medication List       Accurate as of 03/26/17  9:44 AM. Always use your most recent med list.          bisoprolol-hydrochlorothiazide 5-6.25 MG tablet Commonly known as:  ZIAC Take 2 tablets by mouth daily.   CIALIS 5 MG tablet Generic drug:  tadalafil Take by mouth.   etodolac 500 MG tablet Commonly known as:  LODINE Reported on 11/30/2015   finasteride 5 MG tablet Commonly known as:  PROSCAR Take 1 tablet (5 mg total) by mouth daily.   fluticasone 50 MCG/ACT nasal spray Commonly known as:  FLONASE instill 1 to 2 sprays into each nostril once daily   metFORMIN 1000 MG tablet Commonly known as:  GLUCOPHAGE Take 1,000 mg by mouth 2 (two) times daily.   mirabegron ER 25 MG Tb24 tablet Commonly known as:  MYRBETRIQ Take 1 tablet (25 mg total) by mouth daily.   omeprazole 10 MG capsule Commonly known as:  PRILOSEC Take by mouth. Reported on 11/30/2015   Protein Powd Take by mouth.   tamsulosin 0.4 MG Caps capsule Commonly known as:  FLOMAX Take 2 capsules (0.8 mg total) by mouth daily.   testosterone cypionate 200 MG/ML injection Commonly known as:  DEPOTESTOSTERONE CYPIONATE       Allergies:  Allergies  Allergen Reactions  . Penicillins Hives    Family History: Family History  Problem Relation Age of  Onset  . Prostate cancer Father     Social History:  reports that he has never smoked. His smokeless tobacco use includes Chew. He reports that he does not drink alcohol or use drugs.  ROS: UROLOGY Frequent Urination?: Yes Hard to postpone urination?: Yes Burning/pain with urination?: No Get up at night to urinate?: Yes Leakage of urine?: No Urine stream starts and stops?: No Trouble starting stream?: No Do you have to strain to urinate?: No Blood in urine?: No Urinary tract infection?: No Sexually transmitted disease?: No Injury to kidneys or bladder?: No Painful intercourse?: No Weak stream?:  No Erection problems?: No Penile pain?: No  Gastrointestinal Nausea?: No Vomiting?: No Indigestion/heartburn?: No Diarrhea?: No Constipation?: No  Constitutional Fever: No Night sweats?: No Weight loss?: No Fatigue?: No  Skin Skin rash/lesions?: No Itching?: No  Eyes Blurred vision?: No Double vision?: No  Ears/Nose/Throat Sore throat?: No Sinus problems?: Yes  Hematologic/Lymphatic Swollen glands?: No Easy bruising?: No  Cardiovascular Leg swelling?: No Chest pain?: No  Respiratory Cough?: No Shortness of breath?: No  Endocrine Excessive thirst?: No  Musculoskeletal Back pain?: No Joint pain?: No  Neurological Headaches?: No Dizziness?: No  Psychologic Depression?: No Anxiety?: No  Physical Exam: BP 134/72 (BP Location: Left Arm, Patient Position: Sitting, Cuff Size: Large)   Pulse 83   Ht 6\' 2"  (1.88 m)   Wt 253 lb 4.8 oz (114.9 kg)   BMI 32.52 kg/m   Constitutional:  Alert and oriented, No acute distress. HEENT: [place] AT, moist mucus membranes.  Trachea midline, no masses. Cardiovascular: No clubbing, cyanosis, or edema. Respiratory: Normal respiratory effort, no increased work of breathing. GI: Abdomen is soft, nontender, nondistended, no abdominal masses GU: No CVA tenderness. DRE: 2+ benign Skin: No rashes, bruises or suspicious lesions. Lymph: No cervical or inguinal adenopathy. Neurologic: Grossly intact, no focal deficits, moving all 4 extremities. Psychiatric: Normal mood and affect.  Laboratory Data: No results found for: WBC, HGB, HCT, MCV, PLT  No results found for: CREATININE  No results found for: PSA  No results found for: TESTOSTERONE  No results found for: HGBA1C  Urinalysis    Component Value Date/Time   APPEARANCEUR Clear 09/11/2016 0907   GLUCOSEU Negative 09/11/2016 0907   BILIRUBINUR Negative 09/11/2016 0907   PROTEINUR Trace (A) 09/11/2016 0907   NITRITE Negative 09/11/2016 0907   LEUKOCYTESUR Negative  09/11/2016 0907      Assessment & Plan:   1. BPH -continue flomax 0.8 mg and finasteride -6 the patient remains poorly controlled on his maximal medical therapy, we discussed again undergoing a TURP versus a laser ablation of his prostate. We discussed the risks and benefits of both. I think you better served by a traditional TURP given his young age and his are ready frequency and urgency at baseline. Discussed the risks of this which include but are not limited to bleeding, infection, damage to surrounding structures including ureter and sphincters, and other iatrogenic injuries. He understands to be in the hospital at least one night. All questions were answered. The patient has elected to proceed.  2. Hypogonadism -Continue testosterone per PCP.  3. Prostate cancer screening -PSA pending from today  No Follow-up on file.  [name], MD  [facility] [address], [place],  [address] [phone]

## 2017-03-27 ENCOUNTER — Telehealth: Payer: Self-pay

## 2017-03-27 LAB — PSA: Prostate Specific Ag, Serum: 1.9 ng/mL (ref 0.0–4.0)

## 2017-03-27 NOTE — Telephone Encounter (Signed)
[name], MD  Lestine Box, LPN        Please let patient know PSA was normal. Thanks    [facility]- most recent labs are normal

## 2017-03-28 LAB — URINE CULTURE: Organism ID, Bacteria: NO GROWTH

## 2017-03-30 ENCOUNTER — Telehealth: Payer: Self-pay

## 2017-03-30 NOTE — Telephone Encounter (Signed)
Jonathan Retort, MD  Lestine Box, LPN        Please let patient know his PSA was normal. Thanks    [facility]- labs are normal.

## 2017-04-09 ENCOUNTER — Ambulatory Visit (INDEPENDENT_AMBULATORY_CARE_PROVIDER_SITE_OTHER): Payer: Self-pay | Admitting: Vascular Surgery

## 2017-04-09 ENCOUNTER — Encounter (INDEPENDENT_AMBULATORY_CARE_PROVIDER_SITE_OTHER): Payer: Self-pay | Admitting: Vascular Surgery

## 2017-04-09 DIAGNOSIS — I872 Venous insufficiency (chronic) (peripheral): Secondary | ICD-10-CM | POA: Insufficient documentation

## 2017-04-09 DIAGNOSIS — I8311 Varicose veins of right lower extremity with inflammation: Secondary | ICD-10-CM | POA: Insufficient documentation

## 2017-04-09 DIAGNOSIS — I8312 Varicose veins of left lower extremity with inflammation: Secondary | ICD-10-CM | POA: Insufficient documentation

## 2017-04-09 DIAGNOSIS — I8393 Asymptomatic varicose veins of bilateral lower extremities: Secondary | ICD-10-CM | POA: Insufficient documentation

## 2017-04-09 DIAGNOSIS — I83813 Varicose veins of bilateral lower extremities with pain: Secondary | ICD-10-CM | POA: Insufficient documentation

## 2017-04-09 NOTE — Progress Notes (Signed)
    MRN : [id number]  Jonathan Fields is a 61 y.o. (September 27, 1956) male who presents with chief complaint of  Chief Complaint  Patient presents with  . Varicose Veins    Cosmetic Sclero  .   Procedure:  Sclerotherapy using hypertonic saline mixed with 1% Lidocaine was performed on lower extremities bilateral.  Compression wraps were placed.  The patient tolerated the procedure well.  NB:  During this procedure I was call emergently to the OR for a bleeding complication and Ms. Stegmeyer completed the sclerotherapy.  The Patient was informed of this and agreed  Plan:  Follow up as arranged

## 2017-04-16 ENCOUNTER — Ambulatory Visit
Admission: RE | Admit: 2017-04-16 | Discharge: 2017-04-16 | Disposition: A | Discharge status: Home / Self Care | Payer: BLUE CROSS/BLUE SHIELD | Source: Ambulatory Visit | Attending: Family Medicine | Admitting: Family Medicine

## 2017-04-16 ENCOUNTER — Other Ambulatory Visit: Payer: Self-pay | Admitting: Family Medicine

## 2017-04-16 DIAGNOSIS — R05 Cough: Secondary | ICD-10-CM

## 2017-04-16 DIAGNOSIS — R059 Cough, unspecified: Secondary | ICD-10-CM

## 2017-04-23 ENCOUNTER — Other Ambulatory Visit: Payer: Self-pay

## 2017-04-23 DIAGNOSIS — N401 Enlarged prostate with lower urinary tract symptoms: Secondary | ICD-10-CM

## 2017-04-23 MED ORDER — FINASTERIDE 5 MG PO TABS: 5.0000 mg | ORAL_TABLET | Freq: Every day | ORAL | 11 refills | Status: DC

## 2017-12-31 ENCOUNTER — Ambulatory Visit: Payer: BLUE CROSS/BLUE SHIELD | Admitting: Surgery

## 2018-01-12 ENCOUNTER — Encounter: Payer: Self-pay | Admitting: General Practice

## 2018-01-21 ENCOUNTER — Encounter: Payer: Self-pay | Admitting: Urology

## 2018-01-21 ENCOUNTER — Other Ambulatory Visit: Payer: Self-pay | Admitting: Radiology

## 2018-01-21 ENCOUNTER — Ambulatory Visit: Payer: BLUE CROSS/BLUE SHIELD | Admitting: Urology

## 2018-01-21 VITALS — BP 134/87 | HR 99 | Wt 249.0 lb

## 2018-01-21 DIAGNOSIS — N400 Enlarged prostate without lower urinary tract symptoms: Secondary | ICD-10-CM | POA: Diagnosis not present

## 2018-01-21 DIAGNOSIS — Z125 Encounter for screening for malignant neoplasm of prostate: Secondary | ICD-10-CM | POA: Diagnosis not present

## 2018-01-21 DIAGNOSIS — N401 Enlarged prostate with lower urinary tract symptoms: Secondary | ICD-10-CM

## 2018-01-21 LAB — URINALYSIS, COMPLETE
Bilirubin, UA: NEGATIVE
Leukocytes, UA: NEGATIVE
Nitrite, UA: NEGATIVE
RBC, UA: NEGATIVE
Specific Gravity, UA: 1.025 (ref 1.005–1.030)
Urobilinogen, Ur: 0.2 mg/dL (ref 0.2–1.0)
pH, UA: 5 (ref 5.0–7.5)

## 2018-01-21 LAB — MICROSCOPIC EXAMINATION: RBC, UA: NONE SEEN /HPF

## 2018-01-21 NOTE — H&P (View-Only) (Signed)
01/21/2018 9:06 AM   Jonathan Fields 1956/03/12 [id number]  Referring provider: Karen Kitchens, MD Jonathan Fields, [place] [address]  Chief Complaint  Patient presents with  . Benign Prostatic Hypertrophy    HPI: The patient is a 62 year old gentleman with a past medical history significant for BPH which is previously been poorly controlled on maximal medical therapy who at one point was considering a TURP who presents today for follow-up.    1. BPH He is currently on finasteride and Flomax 0.8 mg daily. He is also on Myrbetriq for his urgency symptoms. 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  He however elected not to proceed with a TURP over the summer months.    He has nocturia 5. His significant urgency. He has frequency once per hour. He also feels that he doesn't empty his bladder.  He has a weak stream and does occasionally have to strain.  He is very bothered by these symptoms.  At this point, the patient feels he can no longer put off the TURP as his symptoms are significantly bothersome.  He presents today to discuss undergoing a TURP.  UDS showed decreased flow rate of only 5 . The pressure at maximum flow was 36 cm of water. Maximum detrusor pressure was 78 cm of water. Postvoid residual is 39 cc. He also had increased EMG activity due to poor relaxation of the external sphincter during voiding.    Cystoscopy showed visually obstructive prostate.  2. Hypogonadism He also has a history of hypogonadism. This is managed by his primary care provider with testosterone injections.  3. Prostate cancer screening -Due for PSA/DRE      PMH: Past Medical History:  Diagnosis Date  . Diabetes ([place])   . DVT (deep venous thrombosis) ([place])    post op  . ED  (erectile dysfunction)   . GERD (gastroesophageal reflux disease)   . Hyperlipemia   . Hypertension   . Hypogonadism in male   . Nocturia   . Right ankle instability   . Sleep apnea   . Sleep apnea   . Urinary frequency     Surgical History: Past Surgical History:  Procedure Laterality Date  . COLONOSCOPY  2009  . KNEE ARTHROSCOPY    . right ankle ruptured tendon surgery  2008    Home Medications:  Allergies as of 01/21/2018      Reactions   Penicillins Hives      Medication List        Accurate as of 01/21/18  9:06 AM. Always use your most recent med list.          atorvastatin 20 MG tablet Commonly known as:  LIPITOR TK 1 T PO QD   bisoprolol-hydrochlorothiazide 5-6.25 MG tablet Commonly known as:  ZIAC Take 2 tablets by mouth daily.   finasteride 5 MG tablet Commonly known as:  PROSCAR Take 1 tablet (5 mg total) by mouth daily.   fluticasone 50 MCG/ACT nasal spray Commonly known as:  FLONASE instill 1 to 2 sprays into each nostril once daily   metFORMIN 500 MG 24 hr tablet Commonly known as:  GLUCOPHAGE-XR TK 4 TS PO QAM   montelukast 10 MG tablet Commonly known as:  SINGULAIR Take by mouth.   omeprazole 10 MG capsule Commonly known  as:  PRILOSEC Take by mouth. Reported on 11/30/2015   protein supplement Powd Commonly known as:  UNJURY VANILLA Take by mouth.   tamsulosin 0.4 MG Caps capsule Commonly known as:  FLOMAX Take 2 capsules (0.8 mg total) by mouth daily.   testosterone cypionate 200 MG/ML injection Commonly known as:  DEPOTESTOSTERONE CYPIONATE   traZODone 50 MG tablet Commonly known as:  DESYREL take 1 tablet by mouth at bedtime if needed       Allergies:  Allergies  Allergen Reactions  . Penicillins Hives    Family History: Family History  Problem Relation Age of Onset  . Heart disease Father   . Hypertension Father   . Prostate cancer Father   . Cancer Mother        Breast    Social History:  reports that   has never smoked. His smokeless tobacco use includes chew. He reports that he does not drink alcohol or use drugs.  ROS: UROLOGY Frequent Urination?: Yes Hard to postpone urination?: Yes Burning/pain with urination?: No Get up at night to urinate?: Yes Leakage of urine?: Yes Urine stream starts and stops?: No Trouble starting stream?: No Do you have to strain to urinate?: No Blood in urine?: No Urinary tract infection?: No Sexually transmitted disease?: No Injury to kidneys or bladder?: No Painful intercourse?: No Weak stream?: No Erection problems?: No Penile pain?: No  Gastrointestinal Nausea?: No Vomiting?: No Indigestion/heartburn?: No Diarrhea?: No Constipation?: No  Constitutional Fever: No Night sweats?: No Weight loss?: No Fatigue?: No  Skin Skin rash/lesions?: No Itching?: No  Eyes Blurred vision?: No Double vision?: No  Ears/Nose/Throat Sore throat?: No Sinus problems?: Yes  Hematologic/Lymphatic Swollen glands?: No Easy bruising?: No  Cardiovascular Leg swelling?: No Chest pain?: No  Respiratory Cough?: No Shortness of breath?: No  Endocrine Excessive thirst?: No  Musculoskeletal Back pain?: No Joint pain?: No  Neurological Headaches?: No Dizziness?: No  Psychologic Depression?: No Anxiety?: No  Physical Exam: BP 134/87 (BP Location: Right Arm, Patient Position: Sitting, Cuff Size: Normal)   Pulse 99   Wt 249 lb (112.9 kg)   BMI 31.97 kg/m   Constitutional:  Alert and oriented, No acute distress. HEENT: [place] AT, moist mucus membranes.  Trachea midline, no masses. Cardiovascular: No clubbing, cyanosis, or edema. Respiratory: Normal respiratory effort, no increased work of breathing. GI: Abdomen is soft, nontender, nondistended, no abdominal masses GU: No CVA tenderness.  Skin: No rashes, bruises or suspicious lesions. Lymph: No cervical or inguinal adenopathy. Neurologic: Grossly intact, no focal deficits, moving all 4  extremities. Psychiatric: Normal mood and affect.  Laboratory Data: No results found for: WBC, HGB, HCT, MCV, PLT  No results found for: CREATININE  No results found for: PSA  No results found for: TESTOSTERONE  No results found for: HGBA1C  Urinalysis    Component Value Date/Time   APPEARANCEUR Clear 03/26/2017 0858   GLUCOSEU 1+ (A) 03/26/2017 0858   BILIRUBINUR Negative 03/26/2017 0858   PROTEINUR 1+ (A) 03/26/2017 0858   NITRITE Negative 03/26/2017 0858   LEUKOCYTESUR Negative 03/26/2017 0858    Assessment & Plan:    1. BPH I discussed the patient the risks, benefits, indications of a TURP.  We have discussed this many times in the past.  He understands the risks include but not limited to bleeding, infection, iatrogenic injury, retrograde ejaculation, incomplete resolution of symptoms, need for hospitalization.  We discussed the procedure in great detail as well as the expected postoperative outcome.  All questions  were answered.  The patient will follow-up in the near future for TURP.  2. Hypogonadism -Continue testosterone per PCP.  3. Prostate cancer screening -due for PSA today.  Per patient request we will do DRE at the time of TURP while under anesthesia.   [name], MD  [facility] [address] [place], [place] [address] [phone]

## 2018-01-21 NOTE — Progress Notes (Signed)
01/21/2018 9:06 AM   Jonathan Fields 03-21-1956 [id number]  Referring provider: Karen Kitchens, MD [name], [place] [address]  Chief Complaint  Patient presents with  . Benign Prostatic Hypertrophy    HPI: The patient is a 61 year old gentleman with a past medical history significant for BPH which is previously been poorly controlled on maximal medical therapy who at one point was considering a TURP who presents today for follow-up.    1. BPH He is currently on finasteride and Flomax 0.8 mg daily. He is also on Myrbetriq for his urgency symptoms. 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  He however elected not to proceed with a TURP over the summer months.    He has nocturia 5. His significant urgency. He has frequency once per hour. He also feels that he doesn't empty his bladder.  He has a weak stream and does occasionally have to strain.  He is very bothered by these symptoms.  At this point, the patient feels he can no longer put off the TURP as his symptoms are significantly bothersome.  He presents today to discuss undergoing a TURP.  UDS showed decreased flow rate of only 5 . The pressure at maximum flow was 36 cm of water. Maximum detrusor pressure was 78 cm of water. Postvoid residual is 39 cc. He also had increased EMG activity due to poor relaxation of the external sphincter during voiding.    Cystoscopy showed visually obstructive prostate.  2. Hypogonadism He also has a history of hypogonadism. This is managed by his primary care provider with testosterone injections.  3. Prostate cancer screening -Due for PSA/DRE      PMH: Past Medical History:  Diagnosis Date  . Diabetes ([place])   . DVT (deep venous thrombosis) ([place])    post op  . ED  (erectile dysfunction)   . GERD (gastroesophageal reflux disease)   . Hyperlipemia   . Hypertension   . Hypogonadism in male   . Nocturia   . Right ankle instability   . Sleep apnea   . Sleep apnea   . Urinary frequency     Surgical History: Past Surgical History:  Procedure Laterality Date  . COLONOSCOPY  2009  . KNEE ARTHROSCOPY    . right ankle ruptured tendon surgery  2008    Home Medications:  Allergies as of 01/21/2018      Reactions   Penicillins Hives      Medication List        Accurate as of 01/21/18  9:06 AM. Always use your most recent med list.          atorvastatin 20 MG tablet Commonly known as:  LIPITOR TK 1 T PO QD   bisoprolol-hydrochlorothiazide 5-6.25 MG tablet Commonly known as:  ZIAC Take 2 tablets by mouth daily.   finasteride 5 MG tablet Commonly known as:  PROSCAR Take 1 tablet (5 mg total) by mouth daily.   fluticasone 50 MCG/ACT nasal spray Commonly known as:  FLONASE instill 1 to 2 sprays into each nostril once daily   metFORMIN 500 MG 24 hr tablet Commonly known as:  GLUCOPHAGE-XR TK 4 TS PO QAM   montelukast 10 MG tablet Commonly known as:  SINGULAIR Take by mouth.   omeprazole 10 MG capsule Commonly known  as:  PRILOSEC Take by mouth. Reported on 11/30/2015   protein supplement Powd Commonly known as:  UNJURY VANILLA Take by mouth.   tamsulosin 0.4 MG Caps capsule Commonly known as:  FLOMAX Take 2 capsules (0.8 mg total) by mouth daily.   testosterone cypionate 200 MG/ML injection Commonly known as:  DEPOTESTOSTERONE CYPIONATE   traZODone 50 MG tablet Commonly known as:  DESYREL take 1 tablet by mouth at bedtime if needed       Allergies:  Allergies  Allergen Reactions  . Penicillins Hives    Family History: Family History  Problem Relation Age of Onset  . Heart disease Father   . Hypertension Father   . Prostate cancer Father   . Cancer Mother        Breast    Social History:  reports that   has never smoked. His smokeless tobacco use includes chew. He reports that he does not drink alcohol or use drugs.  ROS: UROLOGY Frequent Urination?: Yes Hard to postpone urination?: Yes Burning/pain with urination?: No Get up at night to urinate?: Yes Leakage of urine?: Yes Urine stream starts and stops?: No Trouble starting stream?: No Do you have to strain to urinate?: No Blood in urine?: No Urinary tract infection?: No Sexually transmitted disease?: No Injury to kidneys or bladder?: No Painful intercourse?: No Weak stream?: No Erection problems?: No Penile pain?: No  Gastrointestinal Nausea?: No Vomiting?: No Indigestion/heartburn?: No Diarrhea?: No Constipation?: No  Constitutional Fever: No Night sweats?: No Weight loss?: No Fatigue?: No  Skin Skin rash/lesions?: No Itching?: No  Eyes Blurred vision?: No Double vision?: No  Ears/Nose/Throat Sore throat?: No Sinus problems?: Yes  Hematologic/Lymphatic Swollen glands?: No Easy bruising?: No  Cardiovascular Leg swelling?: No Chest pain?: No  Respiratory Cough?: No Shortness of breath?: No  Endocrine Excessive thirst?: No  Musculoskeletal Back pain?: No Joint pain?: No  Neurological Headaches?: No Dizziness?: No  Psychologic Depression?: No Anxiety?: No  Physical Exam: BP 134/87 (BP Location: Right Arm, Patient Position: Sitting, Cuff Size: Normal)   Pulse 99   Wt 249 lb (112.9 kg)   BMI 31.97 kg/m   Constitutional:  Alert and oriented, No acute distress. HEENT: [place] AT, moist mucus membranes.  Trachea midline, no masses. Cardiovascular: No clubbing, cyanosis, or edema. Respiratory: Normal respiratory effort, no increased work of breathing. GI: Abdomen is soft, nontender, nondistended, no abdominal masses GU: No CVA tenderness.  Skin: No rashes, bruises or suspicious lesions. Lymph: No cervical or inguinal adenopathy. Neurologic: Grossly intact, no focal deficits, moving all 4  extremities. Psychiatric: Normal mood and affect.  Laboratory Data: No results found for: WBC, HGB, HCT, MCV, PLT  No results found for: CREATININE  No results found for: PSA  No results found for: TESTOSTERONE  No results found for: HGBA1C  Urinalysis    Component Value Date/Time   APPEARANCEUR Clear 03/26/2017 0858   GLUCOSEU 1+ (A) 03/26/2017 0858   BILIRUBINUR Negative 03/26/2017 0858   PROTEINUR 1+ (A) 03/26/2017 0858   NITRITE Negative 03/26/2017 0858   LEUKOCYTESUR Negative 03/26/2017 0858    Assessment & Plan:    1. BPH I discussed the patient the risks, benefits, indications of a TURP.  We have discussed this many times in the past.  He understands the risks include but not limited to bleeding, infection, iatrogenic injury, retrograde ejaculation, incomplete resolution of symptoms, need for hospitalization.  We discussed the procedure in great detail as well as the expected postoperative outcome.  All questions  were answered.  The patient will follow-up in the near future for TURP.  2. Hypogonadism -Continue testosterone per PCP.  3. Prostate cancer screening -due for PSA today.  Per patient request we will do DRE at the time of TURP while under anesthesia.   [name], MD  [facility] [address] [place], [place] [address] [phone]

## 2018-01-22 ENCOUNTER — Ambulatory Visit: Payer: BLUE CROSS/BLUE SHIELD

## 2018-01-22 LAB — PSA: Prostate Specific Ag, Serum: 1.7 ng/mL (ref 0.0–4.0)

## 2018-01-25 LAB — CULTURE, URINE COMPREHENSIVE

## 2018-01-26 ENCOUNTER — Other Ambulatory Visit: Payer: Self-pay | Admitting: Radiology

## 2018-02-12 ENCOUNTER — Other Ambulatory Visit: Payer: Self-pay

## 2018-02-12 ENCOUNTER — Encounter
Admission: RE | Admit: 2018-02-12 | Discharge: 2018-02-12 | Disposition: A | Discharge status: Home / Self Care | Payer: BLUE CROSS/BLUE SHIELD | Source: Ambulatory Visit | Attending: Urology | Admitting: Urology

## 2018-02-12 DIAGNOSIS — Z01810 Encounter for preprocedural cardiovascular examination: Secondary | ICD-10-CM | POA: Diagnosis not present

## 2018-02-12 DIAGNOSIS — Z01812 Encounter for preprocedural laboratory examination: Secondary | ICD-10-CM | POA: Diagnosis present

## 2018-02-12 DIAGNOSIS — E119 Type 2 diabetes mellitus without complications: Secondary | ICD-10-CM | POA: Diagnosis not present

## 2018-02-12 DIAGNOSIS — I10 Essential (primary) hypertension: Secondary | ICD-10-CM | POA: Insufficient documentation

## 2018-02-12 HISTORY — DX: Bronchitis, not specified as acute or chronic: J40

## 2018-02-12 HISTORY — DX: Other complications of anesthesia, initial encounter: T88.59XA

## 2018-02-12 HISTORY — DX: Adverse effect of unspecified anesthetic, initial encounter: T41.45XA

## 2018-02-12 LAB — URINALYSIS, COMPLETE (UACMP) WITH MICROSCOPIC
Bacteria, UA: NONE SEEN
Bilirubin Urine: NEGATIVE
Glucose, UA: >=500 mg/dL — AB
Hgb urine dipstick: NEGATIVE
Ketones, ur: 5 mg/dL — AB
Leukocytes, UA: NEGATIVE
Nitrite: NEGATIVE
Protein, ur: 30 mg/dL — AB
Specific Gravity, Urine: 1.025 (ref 1.005–1.030)
Squamous Epithelial / LPF: NONE SEEN
pH: 5 (ref 5.0–8.0)

## 2018-02-12 LAB — CBC
HCT: 55 % — ABNORMAL HIGH (ref 40.0–52.0)
Hemoglobin: 18.6 g/dL — ABNORMAL HIGH (ref 13.0–18.0)
MCH: 29.2 pg (ref 26.0–34.0)
MCHC: 33.8 g/dL (ref 32.0–36.0)
MCV: 86.4 fL (ref 80.0–100.0)
Platelets: 190 10*3/uL (ref 150–440)
RBC: 6.36 MIL/uL — ABNORMAL HIGH (ref 4.40–5.90)
RDW: 13.7 % (ref 11.5–14.5)
WBC: 6.3 10*3/uL (ref 3.8–10.6)

## 2018-02-12 LAB — BASIC METABOLIC PANEL
Anion gap: 12 (ref 5–15)
BUN: 22 mg/dL — ABNORMAL HIGH (ref 6–20)
CO2: 21 mmol/L — ABNORMAL LOW (ref 22–32)
Calcium: 9 mg/dL (ref 8.9–10.3)
Chloride: 103 mmol/L (ref 101–111)
Creatinine, Ser: 0.9 mg/dL (ref 0.61–1.24)
GFR calc Af Amer: >60 mL/min (ref >60)
GFR calc non Af Amer: >60 mL/min (ref >60)
Glucose, Bld: 219 mg/dL — ABNORMAL HIGH (ref 65–99)
Potassium: 3.9 mmol/L (ref 3.5–5.1)
Sodium: 136 mmol/L (ref 135–145)

## 2018-02-12 NOTE — Patient Instructions (Signed)
Your procedure is scheduled on: Friday, February 19, 2018 Report to Day Surgery on the 2nd floor of the [facility]. To find out your arrival time, please call [phone] between 1PM - 3PM on: Thursday, February 20, 2018  REMEMBER: Instructions that are not followed completely may result in serious medical risk, up to and including death; or upon the discretion of your surgeon and anesthesiologist your surgery may need to be rescheduled.  Do not eat food after midnight the night before your procedure.  No gum chewing, lozengers or hard candies.  You may however, drink water up to 2 hours before you are scheduled to arrive for your surgery. Do not drink anything within 2 hours of the start of your surgery.  No Alcohol for 24 hours before or after surgery.  No Smoking including e-cigarettes for 24 hours prior to surgery.  No chewable tobacco products for at least 6 hours prior to surgery.  No nicotine patches on the day of surgery.  On the morning of surgery brush your teeth with toothpaste and water, you may rinse your mouth with mouthwash if you wish. Do not swallow any toothpaste or mouthwash.  Notify your doctor if there is any change in your medical condition (cold, fever, infection).  Do not wear jewelry, make-up, hairpins, clips or nail polish.  Do not wear lotions, powders, or perfumes. You may wear deodorant.  Do not shave 48 hours prior to surgery. Men may shave face and neck.  Contacts and dentures may not be worn into surgery.  Do not bring valuables to the hospital, including drivers license, insurance or credit cards.  [facility] is not responsible for any belongings or valuables.   TAKE THESE MEDICATIONS THE MORNING OF SURGERY:  1.  FINASTERIDE 2.  OMEPRAZOLE  (take one the night before surgery and one on the morning of surgery - helps to prevent nausea after surgery) 3.  TAMSULOSIN  Stop Metformin 2 days prior to surgery. (LAST DAY TO TAKE IS Tuesday, MARCH  19)  NOW!  Stop Anti-inflammatories (NSAIDS) such as Advil, Aleve, Ibuprofen, Motrin, Naproxen, Naprosyn and Aspirin based products such as Excedrin, Goodys Powder, BC Powder. (May take Tylenol or Acetaminophen if needed.)  NOW!  Stop ANY OVER THE COUNTER supplements until after surgery. (May continue Vitamin D, Vitamin B, and multivitamin.)  Wear comfortable clothing (specific to your surgery type) to the hospital.  Plan for stool softeners for home use.  If you are being admitted to the hospital overnight, leave your suitcase in the car. After surgery it may be brought to your room.  If you are being discharged the day of surgery, you will not be allowed to drive home. You will need a responsible adult to drive you home and stay with you that night.   If you are taking public transportation, you will need to have a responsible adult with you. Please confirm with your physician that it is acceptable to use public transportation.   Please call [phone] if you have any questions about these instructions.

## 2018-02-13 LAB — URINE CULTURE: Culture: NO GROWTH

## 2018-02-18 MED ORDER — CIPROFLOXACIN IN D5W 400 MG/200ML IV SOLN
400.0000 mg | INTRAVENOUS | Status: AC
  Administered 2018-02-19: 400 mg via INTRAVENOUS

## 2018-02-19 ENCOUNTER — Ambulatory Visit: Payer: BLUE CROSS/BLUE SHIELD | Admitting: Anesthesiology

## 2018-02-19 ENCOUNTER — Observation Stay
Admission: RE | Admit: 2018-02-19 | Discharge: 2018-02-20 | Disposition: A | Discharge status: Home / Self Care | Payer: BLUE CROSS/BLUE SHIELD | Source: Ambulatory Visit | Attending: Urology | Admitting: Urology

## 2018-02-19 ENCOUNTER — Encounter
Admission: RE | Disposition: A | Discharge status: Home / Self Care | Payer: Self-pay | Source: Ambulatory Visit | Attending: Urology

## 2018-02-19 ENCOUNTER — Other Ambulatory Visit: Payer: Self-pay

## 2018-02-19 DIAGNOSIS — Z86718 Personal history of other venous thrombosis and embolism: Secondary | ICD-10-CM | POA: Diagnosis not present

## 2018-02-19 DIAGNOSIS — E785 Hyperlipidemia, unspecified: Secondary | ICD-10-CM | POA: Diagnosis not present

## 2018-02-19 DIAGNOSIS — Z7984 Long term (current) use of oral hypoglycemic drugs: Secondary | ICD-10-CM | POA: Insufficient documentation

## 2018-02-19 DIAGNOSIS — K219 Gastro-esophageal reflux disease without esophagitis: Secondary | ICD-10-CM | POA: Diagnosis not present

## 2018-02-19 DIAGNOSIS — E291 Testicular hypofunction: Secondary | ICD-10-CM | POA: Diagnosis not present

## 2018-02-19 DIAGNOSIS — Z880 Allergy status to penicillin: Secondary | ICD-10-CM | POA: Diagnosis not present

## 2018-02-19 DIAGNOSIS — N401 Enlarged prostate with lower urinary tract symptoms: Principal | ICD-10-CM | POA: Insufficient documentation

## 2018-02-19 DIAGNOSIS — I10 Essential (primary) hypertension: Secondary | ICD-10-CM | POA: Insufficient documentation

## 2018-02-19 DIAGNOSIS — Z720 Tobacco use: Secondary | ICD-10-CM | POA: Insufficient documentation

## 2018-02-19 DIAGNOSIS — E1151 Type 2 diabetes mellitus with diabetic peripheral angiopathy without gangrene: Secondary | ICD-10-CM | POA: Insufficient documentation

## 2018-02-19 DIAGNOSIS — R3915 Urgency of urination: Secondary | ICD-10-CM | POA: Diagnosis not present

## 2018-02-19 DIAGNOSIS — Z79899 Other long term (current) drug therapy: Secondary | ICD-10-CM | POA: Diagnosis not present

## 2018-02-19 DIAGNOSIS — N400 Enlarged prostate without lower urinary tract symptoms: Secondary | ICD-10-CM | POA: Diagnosis present

## 2018-02-19 DIAGNOSIS — G4730 Sleep apnea, unspecified: Secondary | ICD-10-CM | POA: Insufficient documentation

## 2018-02-19 DIAGNOSIS — R350 Frequency of micturition: Secondary | ICD-10-CM | POA: Insufficient documentation

## 2018-02-19 DIAGNOSIS — R351 Nocturia: Secondary | ICD-10-CM | POA: Diagnosis not present

## 2018-02-19 HISTORY — PX: TRANSURETHRAL RESECTION OF PROSTATE: SHX73

## 2018-02-19 LAB — GLUCOSE, CAPILLARY
Glucose-Capillary: 184 mg/dL — ABNORMAL HIGH (ref 65–99)
Glucose-Capillary: 174 mg/dL — ABNORMAL HIGH (ref 65–99)
Glucose-Capillary: 228 mg/dL — ABNORMAL HIGH (ref 65–99)
Glucose-Capillary: 297 mg/dL — ABNORMAL HIGH (ref 65–99)
Glucose-Capillary: 185 mg/dL — ABNORMAL HIGH (ref 65–99)

## 2018-02-19 SURGERY — TURP (TRANSURETHRAL RESECTION OF PROSTATE)
Anesthesia: General | Site: Prostate | Wound class: Clean Contaminated

## 2018-02-19 MED ORDER — ROCURONIUM BROMIDE 100 MG/10ML IV SOLN
INTRAVENOUS | Status: DC | PRN
  Administered 2018-02-19: 30 mg via INTRAVENOUS

## 2018-02-19 MED ORDER — LISINOPRIL 20 MG PO TABS
20.0000 mg | ORAL_TABLET | Freq: Every day | ORAL | Status: DC
  Administered 2018-02-20: 20 mg via ORAL
  Filled 2018-02-19: qty 1

## 2018-02-19 MED ORDER — PROPOFOL 10 MG/ML IV BOLUS
INTRAVENOUS | Status: AC
  Filled 2018-02-19: qty 20

## 2018-02-19 MED ORDER — MIDAZOLAM HCL 2 MG/2ML IJ SOLN
INTRAMUSCULAR | Status: DC | PRN
  Administered 2018-02-19: 2 mg via INTRAVENOUS

## 2018-02-19 MED ORDER — SUCCINYLCHOLINE CHLORIDE 20 MG/ML IJ SOLN
INTRAMUSCULAR | Status: DC | PRN
  Administered 2018-02-19: 100 mg via INTRAVENOUS

## 2018-02-19 MED ORDER — HYDROCODONE-ACETAMINOPHEN 5-325 MG PO TABS: 1.0000 | ORAL_TABLET | ORAL | 0 refills | Status: DC | PRN

## 2018-02-19 MED ORDER — FENTANYL CITRATE (PF) 100 MCG/2ML IJ SOLN
INTRAMUSCULAR | Status: DC | PRN
  Administered 2018-02-19: 75 ug via INTRAVENOUS
  Administered 2018-02-19: 25 ug via INTRAVENOUS

## 2018-02-19 MED ORDER — FLUTICASONE PROPIONATE 50 MCG/ACT NA SUSP
1.0000 | Freq: Every day | NASAL | Status: DC
  Administered 2018-02-20: 1 via NASAL
  Filled 2018-02-19: qty 16

## 2018-02-19 MED ORDER — FENTANYL CITRATE (PF) 100 MCG/2ML IJ SOLN: 25.0000 ug | INTRAMUSCULAR | Status: DC | PRN

## 2018-02-19 MED ORDER — CIPROFLOXACIN HCL 500 MG PO TABS: 500.0000 mg | ORAL_TABLET | Freq: Two times a day (BID) | ORAL | 0 refills | Status: DC

## 2018-02-19 MED ORDER — TAMSULOSIN HCL 0.4 MG PO CAPS
0.8000 mg | ORAL_CAPSULE | Freq: Every day | ORAL | Status: DC
  Administered 2018-02-20: 0.8 mg via ORAL
  Filled 2018-02-19: qty 2

## 2018-02-19 MED ORDER — ONDANSETRON HCL 4 MG/2ML IJ SOLN: 4.0000 mg | INTRAMUSCULAR | Status: DC | PRN

## 2018-02-19 MED ORDER — SODIUM CHLORIDE 0.9 % IV SOLN
INTRAVENOUS | Status: DC
  Administered 2018-02-19 – 2018-02-20 (×3): via INTRAVENOUS

## 2018-02-19 MED ORDER — DOCUSATE SODIUM 100 MG PO CAPS
100.0000 mg | ORAL_CAPSULE | Freq: Two times a day (BID) | ORAL | Status: DC
  Administered 2018-02-19 – 2018-02-20 (×3): 100 mg via ORAL
  Filled 2018-02-19 (×3): qty 1

## 2018-02-19 MED ORDER — ONDANSETRON HCL 4 MG/2ML IJ SOLN: 4.0000 mg | Freq: Once | INTRAMUSCULAR | Status: DC | PRN

## 2018-02-19 MED ORDER — ATORVASTATIN CALCIUM 10 MG PO TABS: 10.0000 mg | ORAL_TABLET | Freq: Every day | ORAL | Status: DC

## 2018-02-19 MED ORDER — CIPROFLOXACIN IN D5W 400 MG/200ML IV SOLN
400.0000 mg | Freq: Two times a day (BID) | INTRAVENOUS | Status: DC
  Administered 2018-02-19 – 2018-02-20 (×2): 400 mg via INTRAVENOUS
  Filled 2018-02-19 (×3): qty 200

## 2018-02-19 MED ORDER — CIPROFLOXACIN IN D5W 400 MG/200ML IV SOLN
INTRAVENOUS | Status: AC
  Filled 2018-02-19: qty 200

## 2018-02-19 MED ORDER — MIDAZOLAM HCL 2 MG/2ML IJ SOLN
INTRAMUSCULAR | Status: AC
  Filled 2018-02-19: qty 2

## 2018-02-19 MED ORDER — DEXAMETHASONE SODIUM PHOSPHATE 10 MG/ML IJ SOLN
INTRAMUSCULAR | Status: DC | PRN
  Administered 2018-02-19: 5 mg via INTRAVENOUS

## 2018-02-19 MED ORDER — FENTANYL CITRATE (PF) 100 MCG/2ML IJ SOLN
INTRAMUSCULAR | Status: AC
  Filled 2018-02-19: qty 2

## 2018-02-19 MED ORDER — SUGAMMADEX SODIUM 200 MG/2ML IV SOLN
INTRAVENOUS | Status: DC | PRN
Start: 2018-02-19 — End: 2018-02-19
  Administered 2018-02-19: 451.6 mg via INTRAVENOUS

## 2018-02-19 MED ORDER — INSULIN ASPART 100 UNIT/ML ~~LOC~~ SOLN
0.0000 [IU] | SUBCUTANEOUS | Status: DC
  Administered 2018-02-19: 8 [IU] via SUBCUTANEOUS
  Administered 2018-02-19: 12 [IU] via SUBCUTANEOUS
  Administered 2018-02-19: 4 [IU] via SUBCUTANEOUS
  Administered 2018-02-20: 2 [IU] via SUBCUTANEOUS
  Administered 2018-02-20: 4 [IU] via SUBCUTANEOUS
  Administered 2018-02-20: 2 [IU] via SUBCUTANEOUS
  Administered 2018-02-20: 4 [IU] via SUBCUTANEOUS
  Filled 2018-02-19 (×8): qty 1

## 2018-02-19 MED ORDER — MORPHINE SULFATE (PF) 2 MG/ML IV SOLN: 2.0000 mg | INTRAVENOUS | Status: DC | PRN

## 2018-02-19 MED ORDER — PANTOPRAZOLE SODIUM 40 MG PO TBEC
40.0000 mg | DELAYED_RELEASE_TABLET | Freq: Every day | ORAL | Status: DC
  Administered 2018-02-20: 40 mg via ORAL
  Filled 2018-02-19: qty 1

## 2018-02-19 MED ORDER — HEPARIN SODIUM (PORCINE) 5000 UNIT/ML IJ SOLN
5000.0000 [IU] | Freq: Three times a day (TID) | INTRAMUSCULAR | Status: DC
  Administered 2018-02-19 – 2018-02-20 (×4): 5000 [IU] via SUBCUTANEOUS
  Filled 2018-02-19 (×4): qty 1

## 2018-02-19 MED ORDER — ONDANSETRON HCL 4 MG/2ML IJ SOLN
INTRAMUSCULAR | Status: DC | PRN
  Administered 2018-02-19: 4 mg via INTRAVENOUS

## 2018-02-19 MED ORDER — LACTATED RINGERS IV SOLN
INTRAVENOUS | Status: DC | PRN
  Administered 2018-02-19: 08:00:00 via INTRAVENOUS

## 2018-02-19 MED ORDER — FINASTERIDE 5 MG PO TABS
5.0000 mg | ORAL_TABLET | Freq: Every day | ORAL | Status: DC
  Administered 2018-02-20: 5 mg via ORAL
  Filled 2018-02-19: qty 1

## 2018-02-19 MED ORDER — SODIUM CHLORIDE 0.9 % IV SOLN: INTRAVENOUS | Status: DC

## 2018-02-19 MED ORDER — HYDROCODONE-ACETAMINOPHEN 5-325 MG PO TABS
1.0000 | ORAL_TABLET | ORAL | Status: DC | PRN
  Administered 2018-02-19 (×2): 2 via ORAL
  Administered 2018-02-19: 1 via ORAL
  Administered 2018-02-20 (×2): 2 via ORAL
  Administered 2018-02-20: 1 via ORAL
  Filled 2018-02-19 (×4): qty 2
  Filled 2018-02-19 (×2): qty 1

## 2018-02-19 MED ORDER — CYCLOBENZAPRINE HCL 10 MG PO TABS
10.0000 mg | ORAL_TABLET | Freq: Every day | ORAL | Status: DC
  Administered 2018-02-19: 10 mg via ORAL
  Filled 2018-02-19: qty 1

## 2018-02-19 MED ORDER — LIDOCAINE HCL (CARDIAC) 20 MG/ML IV SOLN
INTRAVENOUS | Status: DC | PRN
Start: 2018-02-19 — End: 2018-02-19
  Administered 2018-02-19: 100 mg via INTRAVENOUS

## 2018-02-19 SURGICAL SUPPLY — 25 items
ADAPTER IRRIG TUBE 2 SPIKE SOL (ADAPTER) ×4 IMPLANT
ADPR TBG 2 SPK PMP STRL ASCP (ADAPTER) ×2
BACTOSHIELD CHG 4% 4OZ (MISCELLANEOUS) ×1
BAG DRAIN CYSTO-URO LG1000N (MISCELLANEOUS) ×3 IMPLANT
BAG URINE DRAINAGE (UROLOGICAL SUPPLIES) ×3 IMPLANT
CATH FOL 3WAY LX COUV 24X75 (CATHETERS) ×3 IMPLANT
ELECT LOOP 22F BIPOLAR SML (ELECTROSURGICAL)
ELECT REM PT RETURN 9FT ADLT (ELECTROSURGICAL)
ELECTRODE LOOP 22F BIPOLAR SML (ELECTROSURGICAL) ×1 IMPLANT
ELECTRODE REM PT RTRN 9FT ADLT (ELECTROSURGICAL) ×1 IMPLANT
GLOVE BIO SURGEON STRL SZ7 (GLOVE) ×6 IMPLANT
GLOVE BIO SURGEON STRL SZ7.5 (GLOVE) ×5 IMPLANT
GOWN STRL REUS W/ TWL LRG LVL3 (GOWN DISPOSABLE) ×2 IMPLANT
GOWN STRL REUS W/TWL LRG LVL3 (GOWN DISPOSABLE) ×6
KIT TURNOVER CYSTO (KITS) ×3 IMPLANT
LOOP CUT BIPOLAR 24F LRG (ELECTROSURGICAL) ×3 IMPLANT
PACK CYSTO AR (MISCELLANEOUS) ×3 IMPLANT
SCRUB CHG 4% DYNA-HEX 4OZ (MISCELLANEOUS) ×2 IMPLANT
SET IRRIG Y TYPE TUR BLADDER L (SET/KITS/TRAYS/PACK) ×3 IMPLANT
SOL .9 NS 3000ML IRR  AL (IV SOLUTION) ×8
SOL .9 NS 3000ML IRR AL (IV SOLUTION) ×4
SOL .9 NS 3000ML IRR UROMATIC (IV SOLUTION) ×4 IMPLANT
SURGILUBE 2OZ TUBE FLIPTOP (MISCELLANEOUS) ×3 IMPLANT
SYRINGE IRR TOOMEY STRL 70CC (SYRINGE) ×3 IMPLANT
WATER STERILE IRR 1000ML POUR (IV SOLUTION) ×3 IMPLANT

## 2018-02-19 NOTE — Transfer of Care (Signed)
Immediate Anesthesia Transfer of Care Note  Patient: Jonathan Fields  Procedure(s) Performed: TRANSURETHRAL RESECTION OF THE PROSTATE (TURP) (N/A Prostate)  Patient Location: PACU  Anesthesia Type:General  Level of Consciousness: awake  Airway & Oxygen Therapy: Patient Spontanous Breathing  Post-op Assessment: Report given to RN  Post vital signs: Reviewed  Last Vitals:  Vitals Value Taken Time  BP 119/85 02/19/2018  8:43 AM  Temp    Pulse 78 02/19/2018  8:44 AM  Resp 12 02/19/2018  8:44 AM  SpO2 97 % 02/19/2018  8:44 AM  Vitals shown include unvalidated device data.  Last Pain:  Vitals:   02/19/18 0843  TempSrc:   PainSc: (P) Asleep         Complications: No apparent anesthesia complications

## 2018-02-19 NOTE — Op Note (Signed)
Date of procedure: 02/19/18  Preoperative diagnosis:  1. BPH  Postoperative diagnosis:  1. BPH  Procedure: 1. Transurethral resection of prostate  Surgeon: Baruch Gouty, MD  Anesthesia: General  Complications: None  Intraoperative findings: The patient had an uneventful transurethral resection of prostate.  He was visually unobstructed at the end of the procedure.  His bilateral ureteral orifices as well as his verumontanum was intact in the procedure.  DRE was 40 g benign preoperatively.  EBL: None  Specimens: Prostate chips to pathology  Drains: 24 French 3-way hematuria catheter on CBI  Disposition: Stable to the postanesthesia care unit  Indication for procedure: The patient is a 62 y.o. male with history of BPH with lower urinary tract symptoms refractory to maximum medical therapy who presents today for transurethral resection of prostate.  After reviewing the management options for treatment, the patient elected to proceed with the above surgical procedure(s). We have discussed the potential benefits and risks of the procedure, side effects of the proposed treatment, the likelihood of the patient achieving the goals of the procedure, and any potential problems that might occur during the procedure or recuperation. Informed consent has been obtained.  Description of procedure: The patient was met in the preoperative area. All risks, benefits, and indications of the procedure were described in great detail. The patient consented to the procedure. Preoperative antibiotics were given. The patient was taken to the operative theater. General anesthesia was induced per the anesthesia service. The patient was then placed in the dorsal lithotomy position and prepped and draped in the usual sterile fashion. A preoperative timeout was called.   A 24 French 30 degree resectoscope with visual obturator was inserted the patient's bladder per urethra atraumatically.  The patient had a  visually obstructive prostate that was noted.  Both ureteral orifices were the verumontanum were identified.  A 360 degree transection of his prostate then took place for approximately 30 minutes.  It was resected down to capsule.  Prostate chips were then evacuated with Ellik evacuator.  Hemostasis was obtained was excellent.  Patient at this point was visually unobstructed.  Capsule was visible in most areas.  The verumontanum and bilateral ureteral orifices were intact.  The resectoscope was removed.  A 24 French three-way hematuria catheter was placed and started on a slow CBI with clear pink output.  The patient was then awoken from anesthesia and transferred in stable condition to the postanesthesia care unit.  Plan: The patient will be transferred to the floor.  He will be discharged home tomorrow after undergoing a trial of void.  Baruch Gouty, M.D.

## 2018-02-19 NOTE — Anesthesia Postprocedure Evaluation (Signed)
Anesthesia Post Note  Patient: Karson Reede Gilliand  Procedure(s) Performed: TRANSURETHRAL RESECTION OF THE PROSTATE (TURP) (N/A Prostate)  Patient location during evaluation: PACU Anesthesia Type: General Level of consciousness: awake and alert Pain management: pain level controlled Vital Signs Assessment: post-procedure vital signs reviewed and stable Respiratory status: spontaneous breathing and respiratory function stable Cardiovascular status: stable Anesthetic complications: no     Last Vitals:  Vitals Value Taken Time  BP 119/85 02/19/2018  8:43 AM  Temp 36.5 C 02/19/2018  8:43 AM  Pulse 80 02/19/2018  8:46 AM  Resp 16 02/19/2018  8:46 AM  SpO2 99 % 02/19/2018  8:46 AM  Vitals shown include unvalidated device data.  Last Pain:  Vitals:   02/19/18 0843  TempSrc:   PainSc: Asleep                 KEPHART,WILLIAM K

## 2018-02-19 NOTE — Anesthesia Post-op Follow-up Note (Signed)
Anesthesia QCDR form completed.        

## 2018-02-19 NOTE — Anesthesia Procedure Notes (Signed)
Procedure Name: Intubation Date/Time: 02/19/2018 8:07 AM Performed by: Timoteo Expose, CRNA Pre-anesthesia Checklist: Patient identified, Emergency Drugs available, Suction available, Patient being monitored and Timeout performed Patient Re-evaluated:Patient Re-evaluated prior to induction Oxygen Delivery Method: Circle system utilized Preoxygenation: Pre-oxygenation with 100% oxygen Induction Type: IV induction Laryngoscope Size: McGraph and 4 Grade View: Grade II Tube type: Oral Number of attempts: 1 Placement Confirmation: ETT inserted through vocal cords under direct vision,  positive ETCO2,  CO2 detector and breath sounds checked- equal and bilateral Secured at: 22 cm Tube secured with: Tape Dental Injury: Teeth and Oropharynx as per pre-operative assessment

## 2018-02-19 NOTE — Interval H&P Note (Signed)
History and Physical Interval Note:  02/19/2018 7:26 AM  Jonathan Fields  has presented today for surgery, with the diagnosis of BPH with LUTS  The various methods of treatment have been discussed with the patient and family. After consideration of risks, benefits and other options for treatment, the patient has consented to  Procedure(s): TRANSURETHRAL RESECTION OF THE PROSTATE (TURP) (N/A) as a surgical intervention .  The patient's history has been reviewed, patient examined, no change in status, stable for surgery.  I have reviewed the patient's chart and labs.  Questions were answered to the patient's satisfaction.    RRR Lungs clear  [name]

## 2018-02-19 NOTE — Anesthesia Preprocedure Evaluation (Signed)
Anesthesia Evaluation  Patient identified by MRN, date of birth, ID band Patient awake    Reviewed: Allergy & Precautions, NPO status , Patient's Chart, lab work & pertinent test results  History of Anesthesia Complications (+) AWARENESS UNDER ANESTHESIA and history of anesthetic complications  Airway Mallampati: II       Dental   Pulmonary sleep apnea (not able to tolerate CPAP) , neg COPD,           Cardiovascular hypertension, Pt. on medications + Peripheral Vascular Disease  (-) Past MI and (-) CHF (-) dysrhythmias (-) Valvular Problems/Murmurs     Neuro/Psych neg Seizures    GI/Hepatic Neg liver ROS, GERD  Medicated and Controlled,  Endo/Other  diabetes, Type 2, Oral Hypoglycemic Agents  Renal/GU negative Renal ROS     Musculoskeletal   Abdominal   Peds  Hematology   Anesthesia Other Findings   Reproductive/Obstetrics                             Anesthesia Physical Anesthesia Plan  ASA: II  Anesthesia Plan: General   Post-op Pain Management:    Induction: Intravenous  PONV Risk Score and Plan:   Airway Management Planned: LMA  Additional Equipment:   Intra-op Plan:   Post-operative Plan:   Informed Consent: I have reviewed the patients History and Physical, chart, labs and discussed the procedure including the risks, benefits and alternatives for the proposed anesthesia with the patient or authorized representative who has indicated his/her understanding and acceptance.     Plan Discussed with:   Anesthesia Plan Comments:         Anesthesia Quick Evaluation

## 2018-02-20 ENCOUNTER — Encounter: Payer: Self-pay | Admitting: Urology

## 2018-02-20 DIAGNOSIS — N401 Enlarged prostate with lower urinary tract symptoms: Secondary | ICD-10-CM | POA: Diagnosis not present

## 2018-02-20 LAB — BASIC METABOLIC PANEL
Anion gap: 8 (ref 5–15)
BUN: 11 mg/dL (ref 6–20)
CO2: 29 mmol/L (ref 22–32)
Calcium: 8.8 mg/dL — ABNORMAL LOW (ref 8.9–10.3)
Chloride: 102 mmol/L (ref 101–111)
Creatinine, Ser: 0.84 mg/dL (ref 0.61–1.24)
GFR calc Af Amer: >60 mL/min (ref >60)
GFR calc non Af Amer: >60 mL/min (ref >60)
Glucose, Bld: 166 mg/dL — ABNORMAL HIGH (ref 65–99)
Potassium: 4.6 mmol/L (ref 3.5–5.1)
Sodium: 139 mmol/L (ref 135–145)

## 2018-02-20 LAB — GLUCOSE, CAPILLARY
Glucose-Capillary: 138 mg/dL — ABNORMAL HIGH (ref 65–99)
Glucose-Capillary: 140 mg/dL — ABNORMAL HIGH (ref 65–99)
Glucose-Capillary: 170 mg/dL — ABNORMAL HIGH (ref 65–99)
Glucose-Capillary: 173 mg/dL — ABNORMAL HIGH (ref 65–99)

## 2018-02-20 LAB — CBC
HCT: 52.9 % — ABNORMAL HIGH (ref 40.0–52.0)
Hemoglobin: 17.8 g/dL (ref 13.0–18.0)
MCH: 29 pg (ref 26.0–34.0)
MCHC: 33.6 g/dL (ref 32.0–36.0)
MCV: 86.4 fL (ref 80.0–100.0)
Platelets: 175 10*3/uL (ref 150–440)
RBC: 6.13 MIL/uL — ABNORMAL HIGH (ref 4.40–5.90)
RDW: 13.8 % (ref 11.5–14.5)
WBC: 12.2 10*3/uL — ABNORMAL HIGH (ref 3.8–10.6)

## 2018-02-20 NOTE — Discharge Summary (Signed)
Physician Discharge Summary  Patient ID: Jonathan Fields MRN: [id number] DOB/AGE: 1956/08/28 62 y.o.  Admit date: 02/19/2018 Discharge date: 02/20/2018  Admission Diagnoses: BPH Discharge Diagnoses:  Active Problems:   BPH (benign prostatic hyperplasia)   Discharged Condition: good  Hospital Course: The patient tolerated the procedure well and was transferred to the floor on IV pain meds, IV fluid. On POD#1 foley was removed, pt was started on regular diet and they ambulated in the halls. Urine was clear at discharge. Prior to discharge the pt was tolerating a regular diet, pain was controlled on PO pain meds, they were ambulating without difficulty, and they had normal bowel function.   Consults: None  Significant Diagnostic Studies: none  Treatments: surgery: TURP   Discharge Exam: Blood pressure 130/84, pulse 75, temperature 97.8 F (36.6 C), temperature source Oral, resp. rate 17, height 6\' 2"  (1.88 m), weight 112.9 kg (249 lb), SpO2 96 %. General appearance: alert, cooperative and appears stated age Head: Normocephalic, without obvious abnormality, atraumatic Nose: Nares normal. Septum midline. Mucosa normal. No drainage or sinus tenderness. Resp: clear to auscultation bilaterally Cardio: regular rate and rhythm, S1, S2 normal, no murmur, click, rub or gallop GI: soft, non-tender; bowel sounds normal; no masses,  no organomegaly Extremities: extremities normal, atraumatic, no cyanosis or edema Neurologic: Grossly normal  Disposition: Discharge disposition: 01-Home or Self Care       Discharge Instructions    Discharge patient   Complete by:  As directed    Discharge disposition:  01-Home or Self Care   Discharge patient date:  02/20/2018     Allergies as of 02/20/2018      Reactions   Penicillins Hives   Has patient had a PCN reaction causing immediate rash, facial/tongue/throat swelling, SOB or lightheadedness with hypotension: yes Has patient had a PCN  reaction causing severe rash involving mucus membranes or skin necrosis: no Has patient had a PCN reaction that required hospitalization: no Has patient had a PCN reaction occurring within the last 10 years: no If all of the above answers are "NO", then may proceed with Cephalosporin use.      Medication List    TAKE these medications   atorvastatin 20 MG tablet Commonly known as:  LIPITOR TK 1 T PO QD   ciprofloxacin 500 MG tablet Commonly known as:  CIPRO Take 1 tablet (500 mg total) by mouth 2 (two) times daily.   cyclobenzaprine 10 MG tablet Commonly known as:  FLEXERIL Take 10 mg by mouth at bedtime.   finasteride 5 MG tablet Commonly known as:  PROSCAR Take 1 tablet (5 mg total) by mouth daily.   fluticasone 50 MCG/ACT nasal spray Commonly known as:  FLONASE instill 1 to 2 sprays into each nostril once daily   HYDROcodone-acetaminophen 5-325 MG tablet Commonly known as:  NORCO Take 1 tablet by mouth every 4 (four) hours as needed for moderate pain.   lisinopril 20 MG tablet Commonly known as:  PRINIVIL,ZESTRIL Take 20 mg by mouth daily.   metFORMIN 500 MG 24 hr tablet Commonly known as:  GLUCOPHAGE-XR TK 4 TS PO QAM   montelukast 10 MG tablet Commonly known as:  SINGULAIR Take 10 mg by mouth daily as needed.   omeprazole 20 MG capsule Commonly known as:  PRILOSEC Take 20 mg by mouth daily.   tamsulosin 0.4 MG Caps capsule Commonly known as:  FLOMAX Take 2 capsules (0.8 mg total) by mouth daily.   testosterone cypionate 200 MG/ML injection Commonly known  as:  DEPOTESTOSTERONE CYPIONATE Inject 100 mg into the muscle once a week.   traZODone 50 MG tablet Commonly known as:  DESYREL take 1 tablet by mouth at bedtime if needed      Follow-up Information    [name], MD.   Specialty:  Urology Why:  As Scheduled Contact information: [address] [place] [address] [phone]           Signed: Nicolette Bang 02/20/2018, 3:20 PM

## 2018-02-20 NOTE — Progress Notes (Signed)
RN removed foley per order. There urine in the foley bag had a red tint to it. No clots were identified in the catheter or foley bag prior to removal.   Jonathan Fields

## 2018-02-20 NOTE — Progress Notes (Signed)
Patient discharge teaching given, including activity, diet, follow-up appoints, and medications. Patient verbalized understanding of all discharge instructions. IV access was d/c'd. Vitals are stable. Skin is intact except as charted in most recent assessments. Pt refused to be escorted out by , to be driven home by wife.  Leanda Padmore [employer]

## 2018-02-20 NOTE — Discharge Instructions (Signed)
Benign Prostatic Hyperplasia  Benign prostatic hyperplasia (BPH) is an enlarged prostate gland that is caused by the normal aging process and not by cancer. The prostate is a walnut-sized gland that is involved in the production of semen. It is located in front of the rectum and below the bladder. The bladder stores urine and the urethra is the tube that carries the urine out of the body. The prostate may get bigger as a man gets older.  An enlarged prostate can press on the urethra. This can make it harder to pass urine. The build-up of urine in the bladder can cause infection. Back pressure and infection may progress to bladder damage and kidney (renal) failure.  What are the causes?  This condition is part of a normal aging process. However, not all men develop problems from this condition. If the prostate enlarges away from the urethra, urine flow will not be blocked. If it enlarges toward the urethra and compresses it, there will be problems passing urine.  What increases the risk?  This condition is more likely to develop in men over the age of 50 years.  What are the signs or symptoms?  Symptoms of this condition include:  · Getting up often during the night to urinate.  · Needing to urinate frequently during the day.  · Difficulty starting urine flow.  · Decrease in size and strength of your urine stream.  · Leaking (dribbling) after urinating.  · Inability to pass urine. This needs immediate treatment.  · Inability to completely empty your bladder.  · Pain when you pass urine. This is more common if there is also an infection.  · Urinary tract infection (UTI).    How is this diagnosed?  This condition is diagnosed based on your medical history, a physical exam, and your symptoms. Tests will also be done, such as:  · A post-void bladder scan. This measures any amount of urine that may remain in your bladder after you finish urinating.  · A digital rectal exam. In a rectal exam, your health care provider  checks your prostate by putting a lubricated, gloved finger into your rectum to feel the back of your prostate gland. This exam detects the size of your gland and any abnormal lumps or growths.  · An exam of your urine (urinalysis).  · A prostate specific antigen (PSA) screening. This is a blood test used to screen for prostate cancer.  · An ultrasound. This test uses sound waves to electronically produce a picture of your prostate gland.    Your health care provider may refer you to a specialist in kidney and prostate diseases (urologist).  How is this treated?  Once symptoms begin, your health care provider will monitor your condition (active surveillance or watchful waiting). Treatment for this condition will depend on the severity of your condition. Treatment may include:  · Observation and yearly exams. This may be the only treatment needed if your condition and symptoms are mild.  · Medicines to relieve your symptoms, including:  ? Medicines to shrink the prostate.  ? Medicines to relax the muscle of the prostate.  · Surgery in severe cases. Surgery may include:  ? Prostatectomy. In this procedure, the prostate tissue is removed completely through an open incision or with a laparascope or robotics.  ? Transurethral resection of the prostate (TURP). In this procedure, a tool is inserted through the opening at the tip of the penis (urethra). It is used to cut away tissue of   the inner core of the prostate. The pieces are removed through the same opening of the penis. This removes the blockage.  ? Transurethral incision (TUIP). In this procedure, small cuts are made in the prostate. This lessens the prostate's pressure on the urethra.  ? Transurethral microwave thermotherapy (TUMT). This procedure uses microwaves to create heat. The heat destroys and removes a small amount of prostate tissue.  ? Transurethral needle ablation (TUNA). This procedure uses radio frequencies to destroy and remove a small amount of  prostate tissue.  ? Interstitial laser coagulation (ILC). This procedure uses a laser to destroy and remove a small amount of prostate tissue.  ? Transurethral electrovaporization (TUVP). This procedure uses electrodes to destroy and remove a small amount of prostate tissue.  ? Prostatic urethral lift. This procedure inserts an implant to push the lobes of the prostate away from the urethra.    Follow these instructions at home:  · Take over-the-counter and prescription medicines only as told by your health care provider.  · Monitor your symptoms for any changes. Contact your health care provider with any changes.  · Avoid drinking large amounts of liquid before going to bed or out in public.  · Avoid or reduce how much caffeine or alcohol you drink.  · Give yourself time when you urinate.  · Keep all follow-up visits as told by your health care provider. This is important.  Contact a health care provider if:  · You have unexplained back pain.  · Your symptoms do not get better with treatment.  · You develop side effects from the medicine you are taking.  · Your urine becomes very dark or has a bad smell.  · Your lower abdomen becomes distended and you have trouble passing your urine.  Get help right away if:  · You have a fever or chills.  · You suddenly cannot urinate.  · You feel lightheaded, or very dizzy, or you faint.  · There are large amounts of blood or clots in the urine.  · Your urinary problems become hard to manage.  · You develop moderate to severe low back or flank pain. The flank is the side of your body between the ribs and the hip.  These symptoms may represent a serious problem that is an emergency. Do not wait to see if the symptoms will go away. Get medical help right away. Call your local emergency services (911 in the [place] Do not drive yourself to the hospital.  Summary  · Benign prostatic hyperplasia (BPH) is an enlarged prostate that is caused by the normal aging process and not by  cancer.  · An enlarged prostate can press on the urethra. This can make it hard to pass urine.  · This condition is part of a normal aging process and is more likely to develop in men over the age of 50 years.  · Get help right away if you suddenly cannot urinate.  This information is not intended to replace advice given to you by your health care provider. Make sure you discuss any questions you have with your health care provider.  Document Released: 11/17/2005 Document Revised: 12/22/2016 Document Reviewed: 12/22/2016  Elsevier Interactive Patient Education © 2018 [employer] Inc[employer]

## 2018-02-22 LAB — SURGICAL PATHOLOGY

## 2018-03-03 ENCOUNTER — Telehealth: Payer: Self-pay | Admitting: Radiology

## 2018-03-03 NOTE — Telephone Encounter (Signed)
Pt c/o hematuria with clots post TURP 1 week ago. Reassured pt that this was normal. Encouraged pt to increase water intake & call back if symptoms do not improve. Pt voices understanding.

## 2018-03-09 ENCOUNTER — Other Ambulatory Visit: Payer: Self-pay

## 2018-03-09 DIAGNOSIS — N401 Enlarged prostate with lower urinary tract symptoms: Secondary | ICD-10-CM

## 2018-03-09 MED ORDER — TAMSULOSIN HCL 0.4 MG PO CAPS: 0.8000 mg | ORAL_CAPSULE | Freq: Every day | ORAL | 11 refills | Status: DC

## 2018-03-26 ENCOUNTER — Ambulatory Visit: Payer: BLUE CROSS/BLUE SHIELD

## 2018-04-05 ENCOUNTER — Encounter: Payer: Self-pay | Admitting: Urology

## 2018-04-05 ENCOUNTER — Ambulatory Visit (INDEPENDENT_AMBULATORY_CARE_PROVIDER_SITE_OTHER): Payer: BLUE CROSS/BLUE SHIELD | Admitting: Urology

## 2018-04-05 VITALS — BP 110/76 | HR 109 | Ht 74.0 in | Wt 240.4 lb

## 2018-04-05 DIAGNOSIS — R350 Frequency of micturition: Secondary | ICD-10-CM

## 2018-04-05 DIAGNOSIS — N401 Enlarged prostate with lower urinary tract symptoms: Secondary | ICD-10-CM

## 2018-04-05 LAB — URINALYSIS, COMPLETE
Bilirubin, UA: NEGATIVE
Ketones, UA: NEGATIVE
Leukocytes, UA: NEGATIVE
Nitrite, UA: NEGATIVE
Protein, UA: NEGATIVE
Specific Gravity, UA: <1.005 — ABNORMAL LOW (ref 1.005–1.030)
Urobilinogen, Ur: 0.2 mg/dL (ref 0.2–1.0)
pH, UA: 5 (ref 5.0–7.5)

## 2018-04-05 LAB — MICROSCOPIC EXAMINATION
Bacteria, UA: NONE SEEN
Epithelial Cells (non renal): NONE SEEN /hpf (ref 0–10)
WBC, UA: NONE SEEN /hpf (ref 0–5)

## 2018-04-05 NOTE — Progress Notes (Signed)
04/05/2018 10:31 AM   Jonathan Fields 04-08-1956 [id number]  Referring provider: Karen Kitchens, MD [place], [place] [address]  Chief Complaint  Patient presents with  . Follow-up    HPI:  Patient has a history of BPH.  He has taken finasteride, tamsulosin 0.8 and Myrbetriq.  He has PSA was 1.07 January 2018. UDS showed decreased flow rate of only 5 . The pressureatmaximum flow was 36 cm of water. Maximum detrusor pressure was 78 cm of water. Postvoid residual is 39 cc. He also had increased EMG activity due to poor relaxation of the external sphincter during voiding. He underwent TURP February 19, 2018 with Dr. Pilar Jarvis.  Pathology was benign. He has a h/o low T and T replacement with his PCP.   Today, he is doing well. He has a good flow. He feels empty and a better flow. He goes about every two hours (stable). He has urgency and mild UUI which has improved. Currently on tamsulosin and finasteride. He noticed a couple small blood clots last week, but no issues with voiding and they cleared.     PMH: Past Medical History:  Diagnosis Date  . Bronchitis   . Complication of anesthesia    hard to wake up  . Diabetes ([place])   . DVT (deep venous thrombosis) ([place])    post op  . ED (erectile dysfunction)   . GERD (gastroesophageal reflux disease)   . Hyperlipemia   . Hypertension   . Hypogonadism in male   . Nocturia   . Right ankle instability   . Sleep apnea   . Sleep apnea   . Urinary frequency     Surgical History: Past Surgical History:  Procedure Laterality Date  . ANKLE SURGERY Left    torn ligament  . COLONOSCOPY  2009  . KNEE ARTHROSCOPY Left   . right ankle ruptured tendon surgery  2008  . TRANSURETHRAL RESECTION OF PROSTATE N/A 02/19/2018   Procedure: TRANSURETHRAL RESECTION OF THE PROSTATE (TURP);  Surgeon: [name], MD;  Location: [facility] ORS;  Service: Urology;  Laterality: N/A;    Home Medications:  Allergies as of 04/05/2018    Reactions   Penicillins Hives   Has patient had a PCN reaction causing immediate rash, facial/tongue/throat swelling, SOB or lightheadedness with hypotension: yes Has patient had a PCN reaction causing severe rash involving mucus membranes or skin necrosis: no Has patient had a PCN reaction that required hospitalization: no Has patient had a PCN reaction occurring within the last 10 years: no If all of the above answers are "NO", then may proceed with Cephalosporin use.      Medication List        Accurate as of 04/05/18 10:31 AM. Always use your most recent med list.          atorvastatin 20 MG tablet Commonly known as:  LIPITOR TK 1 T PO QD   cyclobenzaprine 10 MG tablet Commonly known as:  FLEXERIL Take 10 mg by mouth at bedtime.   finasteride 5 MG tablet Commonly known as:  PROSCAR Take 1 tablet (5 mg total) by mouth daily.   fluticasone 50 MCG/ACT nasal spray Commonly known as:  FLONASE instill 1 to 2 sprays into each nostril once daily   lisinopril 20 MG tablet Commonly known as:  PRINIVIL,ZESTRIL Take 20 mg by mouth daily.   metFORMIN 500 MG 24 hr tablet Commonly known as:  GLUCOPHAGE-XR TK 4 TS PO QAM   montelukast 10  MG tablet Commonly known as:  SINGULAIR Take 10 mg by mouth daily as needed.   omeprazole 20 MG capsule Commonly known as:  PRILOSEC Take 20 mg by mouth daily.   tamsulosin 0.4 MG Caps capsule Commonly known as:  FLOMAX Take 2 capsules (0.8 mg total) by mouth daily.   testosterone cypionate 200 MG/ML injection Commonly known as:  DEPOTESTOSTERONE CYPIONATE Inject 100 mg into the muscle once a week.   traZODone 50 MG tablet Commonly known as:  DESYREL take 1 tablet by mouth at bedtime if needed       Allergies:  Allergies  Allergen Reactions  . Penicillins Hives    Has patient had a PCN reaction causing immediate rash, facial/tongue/throat swelling, SOB or lightheadedness with hypotension: yes Has patient had a PCN reaction  causing severe rash involving mucus membranes or skin necrosis: no Has patient had a PCN reaction that required hospitalization: no Has patient had a PCN reaction occurring within the last 10 years: no If all of the above answers are "NO", then may proceed with Cephalosporin use.     Family History: Family History  Problem Relation Age of Onset  . Heart disease Father   . Hypertension Father   . Prostate cancer Father   . Cancer Mother        Breast  . Heart disease Mother     Social History:  reports that he has never smoked. His smokeless tobacco use includes chew. He reports that he does not drink alcohol or use drugs.  ROS: UROLOGY Frequent Urination?: Yes Hard to postpone urination?: Yes Burning/pain with urination?: No Get up at night to urinate?: Yes Leakage of urine?: No Urine stream starts and stops?: No Trouble starting stream?: No Do you have to strain to urinate?: No Blood in urine?: No Urinary tract infection?: No Sexually transmitted disease?: No Injury to kidneys or bladder?: No Painful intercourse?: No Weak stream?: No Erection problems?: No Penile pain?: No  Gastrointestinal Nausea?: No Vomiting?: No Indigestion/heartburn?: No Diarrhea?: No Constipation?: No  Constitutional Fever: No Night sweats?: No Weight loss?: No Fatigue?: No  Skin Skin rash/lesions?: No Itching?: No  Eyes Blurred vision?: No Double vision?: No  Ears/Nose/Throat Sore throat?: No Sinus problems?: Yes  Hematologic/Lymphatic Swollen glands?: No Easy bruising?: No  Cardiovascular Leg swelling?: No Chest pain?: No  Respiratory Cough?: No Shortness of breath?: No  Endocrine Excessive thirst?: No  Musculoskeletal Back pain?: No Joint pain?: No  Neurological Headaches?: No Dizziness?: No  Psychologic Depression?: No Anxiety?: No  Physical Exam: There were no vitals taken for this visit.  Constitutional:  Alert and oriented, No acute  distress. HEENT: [place] AT, moist mucus membranes.  Trachea midline, no masses. Cardiovascular: No clubbing, cyanosis, or edema. Respiratory: Normal respiratory effort, no increased work of breathing. GI: Abdomen is soft, nontender, nondistended, no abdominal masses GU: No CVA tenderness Skin: No rashes, bruises or suspicious lesions. Neurologic: Grossly intact, no focal deficits, moving all 4 extremities. Psychiatric: Normal mood and affect.  Laboratory Data: Lab Results  Component Value Date   WBC 12.2 (H) 02/20/2018   HGB 17.8 02/20/2018   HCT 52.9 (H) 02/20/2018   MCV 86.4 02/20/2018   PLT 175 02/20/2018    Lab Results  Component Value Date   CREATININE 0.84 02/20/2018    No results found for: PSA  No results found for: TESTOSTERONE  No results found for: HGBA1C  Urinalysis    Component Value Date/Time   COLORURINE YELLOW (A) 02/12/2018 0920  APPEARANCEUR CLEAR (A) 02/12/2018 0920   APPEARANCEUR Clear 01/21/2018 0935   LABSPEC 1.025 02/12/2018 0920   PHURINE 5.0 02/12/2018 0920   GLUCOSEU >=500 (A) 02/12/2018 0920   HGBUR NEGATIVE 02/12/2018 0920   BILIRUBINUR NEGATIVE 02/12/2018 0920   BILIRUBINUR Negative 01/21/2018 0935   KETONESUR 5 (A) 02/12/2018 0920   PROTEINUR 30 (A) 02/12/2018 0920   NITRITE NEGATIVE 02/12/2018 0920   LEUKOCYTESUR NEGATIVE 02/12/2018 0920   LEUKOCYTESUR Negative 01/21/2018 0935    Lab Results  Component Value Date   LABMICR See below: 01/21/2018   WBCUA 0-5 01/21/2018   RBCUA None seen 01/21/2018   LABEPIT 0-10 01/21/2018   MUCUS Present (A) 01/21/2018   BACTERIA NONE SEEN 02/12/2018    No results found for this or any previous visit. No results found for this or any previous visit. No results found for this or any previous visit. No results found for this or any previous visit. No results found for this or any previous visit. No results found for this or any previous visit. No results found for this or any previous  visit. No results found for this or any previous visit.  Assessment & Plan:    1. Benign prostatic hyperplasia with lower urinary tract symptoms, symptom details unspecified  Flow and urgency/UUI improved. Still with some frequency/nocturia. Discussed stopping alpha blocker or adding back myrbetriq. Will keep things the same for now. We discussed post-op care. He can resume sexually activity (he asked about it).   - Urinalysis, Complete   No follow-ups on file.  Festus Aloe, MD  [facility] [address], [place], [place] [address] [phone]

## 2018-05-19 ENCOUNTER — Other Ambulatory Visit: Payer: Self-pay | Admitting: Family Medicine

## 2018-05-19 DIAGNOSIS — N401 Enlarged prostate with lower urinary tract symptoms: Secondary | ICD-10-CM

## 2018-05-19 MED ORDER — FINASTERIDE 5 MG PO TABS: 5.0000 mg | ORAL_TABLET | Freq: Every day | ORAL | 11 refills | Status: DC

## 2018-08-06 ENCOUNTER — Other Ambulatory Visit: Payer: Self-pay | Admitting: Chiropractic Medicine

## 2018-08-06 DIAGNOSIS — M542 Cervicalgia: Secondary | ICD-10-CM

## 2018-08-16 ENCOUNTER — Ambulatory Visit
Admission: RE | Admit: 2018-08-16 | Discharge: 2018-08-16 | Disposition: A | Discharge status: Home / Self Care | Payer: BLUE CROSS/BLUE SHIELD | Source: Ambulatory Visit | Attending: Chiropractic Medicine | Admitting: Chiropractic Medicine

## 2018-08-16 DIAGNOSIS — M1288 Other specific arthropathies, not elsewhere classified, other specified site: Secondary | ICD-10-CM | POA: Diagnosis not present

## 2018-08-16 DIAGNOSIS — M542 Cervicalgia: Secondary | ICD-10-CM | POA: Diagnosis not present

## 2018-08-16 DIAGNOSIS — M50323 Other cervical disc degeneration at C6-C7 level: Secondary | ICD-10-CM | POA: Insufficient documentation

## 2018-09-27 LAB — HEMOGLOBIN A1C: Hemoglobin A1C: 6.9

## 2018-10-08 ENCOUNTER — Ambulatory Visit: Payer: BLUE CROSS/BLUE SHIELD

## 2018-11-17 DIAGNOSIS — M216X1 Other acquired deformities of right foot: Secondary | ICD-10-CM | POA: Diagnosis not present

## 2018-11-17 DIAGNOSIS — M25371 Other instability, right ankle: Secondary | ICD-10-CM | POA: Diagnosis not present

## 2018-11-17 DIAGNOSIS — M170 Bilateral primary osteoarthritis of knee: Secondary | ICD-10-CM | POA: Diagnosis not present

## 2018-11-17 DIAGNOSIS — M79671 Pain in right foot: Secondary | ICD-10-CM | POA: Diagnosis not present

## 2018-11-17 DIAGNOSIS — M25571 Pain in right ankle and joints of right foot: Secondary | ICD-10-CM | POA: Diagnosis not present

## 2018-11-17 DIAGNOSIS — Q661 Congenital talipes calcaneovarus, unspecified foot: Secondary | ICD-10-CM | POA: Diagnosis not present

## 2018-11-17 DIAGNOSIS — M160 Bilateral primary osteoarthritis of hip: Secondary | ICD-10-CM | POA: Diagnosis not present

## 2018-11-17 DIAGNOSIS — M21171 Varus deformity, not elsewhere classified, right ankle: Secondary | ICD-10-CM | POA: Insufficient documentation

## 2018-11-26 LAB — HEMOGLOBIN A1C: Hemoglobin A1C: 7

## 2018-11-29 DIAGNOSIS — M19071 Primary osteoarthritis, right ankle and foot: Secondary | ICD-10-CM | POA: Diagnosis not present

## 2018-11-29 DIAGNOSIS — Q661 Congenital talipes calcaneovarus, unspecified foot: Secondary | ICD-10-CM | POA: Diagnosis not present

## 2019-01-05 DIAGNOSIS — M216X1 Other acquired deformities of right foot: Secondary | ICD-10-CM | POA: Diagnosis not present

## 2019-01-05 DIAGNOSIS — M25371 Other instability, right ankle: Secondary | ICD-10-CM | POA: Diagnosis not present

## 2019-01-13 DIAGNOSIS — G5603 Carpal tunnel syndrome, bilateral upper limbs: Secondary | ICD-10-CM | POA: Diagnosis not present

## 2019-01-19 ENCOUNTER — Ambulatory Visit: Payer: BLUE CROSS/BLUE SHIELD | Admitting: Urology

## 2019-01-27 ENCOUNTER — Ambulatory Visit: Payer: BLUE CROSS/BLUE SHIELD | Admitting: Urology

## 2019-01-28 ENCOUNTER — Ambulatory Visit: Payer: BLUE CROSS/BLUE SHIELD | Admitting: Urology

## 2019-01-28 ENCOUNTER — Encounter: Payer: Self-pay | Admitting: Urology

## 2019-01-28 VITALS — BP 167/84 | HR 98 | Ht 74.0 in | Wt 246.0 lb

## 2019-01-28 DIAGNOSIS — E291 Testicular hypofunction: Secondary | ICD-10-CM

## 2019-01-28 DIAGNOSIS — Z87438 Personal history of other diseases of male genital organs: Secondary | ICD-10-CM | POA: Diagnosis not present

## 2019-01-28 LAB — BLADDER SCAN AMB NON-IMAGING

## 2019-01-28 NOTE — Patient Instructions (Signed)
Prostate Cancer Screening  The prostate is a walnut-sized gland that is located below the bladder and in front of the rectum in males. The function of the prostate (prostate gland) is to add fluid to semen during ejaculation. Prostate cancer is the second most common type of cancer in men. A screening test for cancer is a test that is done before cancer symptoms start. Screening can help to identify cancer at an early stage, when the cancer can be treated more easily. The recommended prostate cancer screening test is a blood test called the prostate-specific antigen (PSA) test. PSA is a protein that is made in the prostate. As you age, your prostate naturally produces more PSA. Abnormally high PSA levels may be caused by:  Prostate cancer.  An enlarged prostate that is not caused by cancer (benign prostatic hyperplasia, BPH). This condition is very common in older men.  A prostate gland infection (prostatitis).  Medicines to assist with hair growth, such as finasteride. Depending on the PSA results, you may need more tests, such as:  A physical exam to check the size of your prostate gland.  Blood and imaging tests.  A procedure to remove tissue samples from your prostate gland for testing (biopsy). Who should have screening? Screening recommendations vary based on age.  If you are younger than age 40, screening is not recommended.  If you are age 40-54 and you have no risk factors, screening is not recommended.  If you are younger than age 55, ask your health care provider if you need screening if you have one of these risk factors: ? Being of African-American descent. ? Having a family history of prostate cancer.  If you are age 55-69, talk with your health care provider about your need for screening and how often screening should be done.  If you are older than age 70, screening is not recommended. This is because the risks that screening can cause are greater than the benefits  that it may provide (risks outweigh the benefits). If you are at high risk for prostate cancer, your health care provider may recommend that you have screenings more often or start screening at a younger age. You may be at high risk if you:  Are older than age 55.  Are African-American.  Have a father, brother, or uncle who has been diagnosed with prostate cancer. The risk may be higher if your family member's cancer occurred at an early age. What are the benefits of screening? There is a small chance that screening may lower your risk of dying from prostate cancer. The chance is small because prostate cancer is typically a slow-growing cancer, and most men with prostate cancer die from a different cause. What are the risks of screening? The main risk of prostate cancer screening is diagnosing and treating prostate cancer that would never have caused any symptoms or problems (overdiagnosis and overtreatment). PSA screening cannot tell you if your PSA is high due to cancer or a different cause. A prostate biopsy is the only procedure to diagnose prostate cancer. Even the results of a biopsy may not tell you if your cancer needs to be treated. Slow-growing prostate cancer may not need any treatment other than monitoring, so diagnosing and treating it may cause unnecessary stress or other side effects. A prostate biopsy may also cause:  Infection or fever.  A false negative. This is a result that shows that you do not have prostate cancer when you actually do have prostate cancer. Questions   to ask your health care provider  When should I start prostate cancer screening?  What is my risk for prostate cancer?  How often do I need screening?  What type of screening tests do I need?  How do I get my test results?  What do my results mean?  Do I need treatment? Contact a health care provider if:  You have difficulty urinating.  You have pain when you urinate or ejaculate.  You have  blood in your urine or semen.  You have pain in your back or in the area of your prostate.  You have trouble getting or maintaining an erection (erectile dysfunction, ED). Summary  Prostate cancer is a common type of cancer in men. The prostate (prostate gland) is located below the bladder and in front of the rectum. This gland adds fluid to semen during ejaculation.  Prostate cancer screening may identify cancer at an early stage, when the cancer can be treated more easily.  The prostate-specific antigen (PSA) test is the recommended screening test for prostate cancer.  Discuss the risks and benefits of prostate cancer screening with your health care provider. If you are age 46 or older, screening is likely to lead to more risks than benefits (risks outweigh the benefits). This information is not intended to replace advice given to you by your health care provider. Make sure you discuss any questions you have with your health care provider. Document Released: 08/28/2017 Document Revised: 08/28/2017 Document Reviewed: 08/28/2017 Elsevier Interactive Patient Education  2019 [employer] Testosterone Replacement Therapy  Testosterone replacement therapy (TRT) is used to treat men who have a low testosterone level (hypogonadism). Testosterone is a male hormone that is produced in the testicles. It is responsible for typically male characteristics and for maintaining a man's sex drive and the ability to get an erection. Testosterone also supports bone and muscle health. TRT can be a gel, liquid, or patch that you put on your skin. It can also be in the form of a tablet or an injection. In some cases, your health care provider may insert long-acting pellets under your skin. In most men, the level of testosterone starts to decline gradually after age 26. Low testosterone can also be caused by certain medical conditions, medicines, and obesity. Your health care provider can diagnose hypogonadism with  at least two blood tests that are done early in the morning. Low testosterone may not need to be treated. TRT is usually a choice that you make with your health care provider. Your health care provider may recommend TRT if you have low testosterone that is causing symptoms, such as:  Low sex drive.  Erection problems.  Breast enlargement.  Loss of body hair.  Weak muscles or bones.  Shrinking testicles.  Increased body fat.  Low energy.  Hot flashes.  Depression.  Decreased work Systems analyst. TRT is a lifetime treatment. If you stop treatment, your testosterone will drop, and your symptoms may return. What are the risks? Testosterone replacement therapy may have side effects, including:  Lower sperm count.  Skin irritation at the application or injection site.  Mouth irritation if you take an oral tablet.  Acne.  Swelling of your legs or feet.  Tender breasts.  Dizziness.  Sleep disturbance.  Mood swings.  Possible increased risk of stroke or heart attack. Testosterone replacement therapy may also increase your risk for prostate cancer or male breast cancer. You should not use TRT if you have either of those conditions. Your health care  provider also may not recommend TRT if:  You are suspected of having prostate cancer.  You want to father a child.  You have a high number of red blood cells.  You have untreated sleep apnea.  You have a very large prostate. Supplies needed:  Your health care provider will prescribe the testosterone gel, solution, or medicine that you need. If your health care provider teaches you to do self-injections at home, you will also need: ? Your medicine vial. ? Disposable needles and syringes. ? Alcohol swabs. ? A needle disposal container. ? Adhesive bandages. How to use testosterone replacement therapy Your health care provider will help you find the TRT option that will work best for you based on your preference, the side  effects, and the cost. You may:  Rub testosterone gel on your upper arm or shoulder every day after a shower. This is the most common type of TRT. Do not let women or children come in contact with the gel.  Apply a testosterone solution under your arms once each day.  Place a testosterone patch on your skin once each day.  Dissolve a testosterone tablet in your mouth twice each day.  Have a testosterone pellet inserted under your skin by your health care provider. This will be replaced every 3-6 months.  Use testosterone nasal spray three times each day.  Get testosterone injections. For some types of testosterone, your health care provider will give you this injection. With other types of testosterone, you may be taught to give injections to yourself. The frequency of injections may vary based on the type of testosterone that you receive. Follow these instructions at home:  Take over-the-counter and prescription medicines only as told by your health care provider.  Lose weight if you are overweight. Ask your health care provider to help you start a healthy diet and exercise program to reach and maintain a healthy weight.  Work with your health care provider to treat other medical conditions that may lower your testosterone. These include obesity, high blood pressure, high cholesterol, diabetes, liver disease, kidney disease, and sleep apnea.  Keep all follow-up visits as told by your health care provider. This is important. General recommendations  Discuss all risks and benefits with your health care provider before starting therapy.  Work with your health care provider to check your prostate health and do blood testing before you start therapy.  Do not use any testosterone replacement therapies that are not prescribed by your health care provider or not approved for use in the [place]  Do not use TRT for bodybuilding or to improve sexual performance. TRT should be used only to treat  symptoms of low testosterone.  Return for all repeat prostate checks and blood tests during therapy, as told by your health care provider. Where to find more information Learn more about testosterone replacement therapy from:  [place]: www.urologyhealth.org/urologic-conditions/low-testosterone-(hypogonadism)  Endocrine Society: www.hormone.org/diseases-and-conditions/mens-health/hypogonadism Contact a health care provider if:  You have side effects from your testosterone replacement therapy.  You continue to have symptoms of low testosterone during treatment.  You develop new symptoms during treatment. Summary  Testosterone replacement therapy is only for men who have low testosterone as determined by blood testing and who have symptoms of low testosterone.  Testosterone replacement therapy should be prescribed only by a health care provider and should be used under the supervision of a health care provider.  You may not be able to take testosterone if you have certain medical conditions,  including prostate cancer, male breast cancer, or heart disease.  Testosterone replacement therapy may have side effects and may make some medical conditions worse.  Talk with your health care provider about all the risks and benefits before you start therapy. This information is not intended to replace advice given to you by your health care provider. Make sure you discuss any questions you have with your health care provider. Document Released: 08/07/2016 Document Revised: 08/07/2016 Document Reviewed: 08/07/2016 Elsevier Interactive Patient Education  2019 [employer].

## 2019-01-28 NOTE — Progress Notes (Signed)
01/28/2019 8:58 AM   Jonathan Fields [birth year] [id number]  Referring provider: Karen Kitchens, MD Jonathan Fields, [place] [id number]  Chief Complaint  Patient presents with  . Follow-up   Urologic history: 1.  BPH with lower urinary tract symptoms  -Previously on multiple medications including finasteride, tamsulosin 0.8 mg and Myrbetriq  -Urodynamic study consistent with obstruction.  -TURP by Dr. Pilar Fields 01/2018; 7 g resected, benign pathology  2.  Hypogonadism   -On TRT    HPI: 63 year old male presents for follow-up.  He was last seen in May 2019 by Dr. Junious Fields.  He states he is voiding very well.  Over the past 1 to 2 months he does note increased frequency but attributes this to his significant water intake.  He has nocturia x1.  He voids with an excellent stream.  Denies urinary incontinence.  Review of his medications show that he is still taking tamsulosin 0.8 mg and finasteride.  He is also on TRT which has most recently been managed by the city physician.  He states a testosterone last month was approximately 1100 and his dose was increased from 300 mg weekly to 300 mg every 2 weeks.  He does not feel as well with this dose change.  Denies breast tenderness/enlargement or lower extremity edema.   PMH: Past Medical History:  Diagnosis Date  . Bronchitis   . Complication of anesthesia    hard to wake up  . Diabetes ([place])   . DVT (deep venous thrombosis) ([place])    post op  . ED (erectile dysfunction)   . GERD (gastroesophageal reflux disease)   . Hyperlipemia   . Hypertension   . Hypogonadism in male   . Nocturia   . Right ankle instability   . Sleep apnea   . Sleep apnea   . Urinary frequency     Surgical History: Past Surgical History:  Procedure Laterality Date  . ANKLE SURGERY Left    torn ligament  . COLONOSCOPY  2009  . KNEE ARTHROSCOPY Left   . right ankle ruptured tendon surgery  2008  . TRANSURETHRAL RESECTION OF PROSTATE N/A  02/19/2018   Procedure: TRANSURETHRAL RESECTION OF THE PROSTATE (TURP);  Surgeon: Jonathan Retort, MD;  Location: [facility] ORS;  Service: Urology;  Laterality: N/A;    Home Medications:  Allergies as of 01/28/2019      Reactions   Penicillins Hives   Has patient had a PCN reaction causing immediate rash, facial/tongue/throat swelling, SOB or lightheadedness with hypotension: yes Has patient had a PCN reaction causing severe rash involving mucus membranes or skin necrosis: no Has patient had a PCN reaction that required hospitalization: no Has patient had a PCN reaction occurring within the last 10 years: no If all of the above answers are "NO", then may proceed with Cephalosporin use.      Medication List       Accurate as of January 28, 2019  8:58 AM. Always use your most recent med list.        finasteride 5 MG tablet Commonly known as:  PROSCAR Take 1 tablet (5 mg total) by mouth daily.   fluticasone 50 MCG/ACT nasal spray Commonly known as:  FLONASE instill 1 to 2 sprays into each nostril once daily   glipiZIDE 5 MG 24 hr tablet Commonly known as:  GLUCOTROL XL TK 1 T PO QD   hydrochlorothiazide 12.5 MG tablet Commonly known as:  HYDRODIURIL TK 1 T PO QD IN EARLY MORNING  lisinopril 20 MG tablet Commonly known as:  PRINIVIL,ZESTRIL Take 20 mg by mouth daily.   metFORMIN 500 MG 24 hr tablet Commonly known as:  GLUCOPHAGE-XR TK 4 TS PO QAM   montelukast 10 MG tablet Commonly known as:  SINGULAIR Take 10 mg by mouth daily as needed.   omeprazole 20 MG capsule Commonly known as:  PRILOSEC Take 20 mg by mouth daily.   tamsulosin 0.4 MG Caps capsule Commonly known as:  FLOMAX Take 2 capsules (0.8 mg total) by mouth daily.   testosterone cypionate 200 MG/ML injection Commonly known as:  DEPOTESTOSTERONE CYPIONATE Inject 100 mg into the muscle once a week.       Allergies:  Allergies  Allergen Reactions  . Penicillins Hives    Has patient had a PCN  reaction causing immediate rash, facial/tongue/throat swelling, SOB or lightheadedness with hypotension: yes Has patient had a PCN reaction causing severe rash involving mucus membranes or skin necrosis: no Has patient had a PCN reaction that required hospitalization: no Has patient had a PCN reaction occurring within the last 10 years: no If all of the above answers are "NO", then may proceed with Cephalosporin use.     Family History: Family History  Problem Relation Age of Onset  . Heart disease Father   . Hypertension Father   . Prostate cancer Father   . Cancer Mother        Breast  . Heart disease Mother     Social History:  reports that he has never smoked. His smokeless tobacco use includes chew. He reports that he does not drink alcohol or use drugs.  ROS: UROLOGY Frequent Urination?: Yes Hard to postpone urination?: Yes Burning/pain with urination?: No Get up at night to urinate?: Yes Leakage of urine?: No Urine stream starts and stops?: No Trouble starting stream?: No Do you have to strain to urinate?: No Blood in urine?: No Urinary tract infection?: No Sexually transmitted disease?: No Injury to kidneys or bladder?: No Painful intercourse?: No Weak stream?: No Erection problems?: No Penile pain?: No  Gastrointestinal Nausea?: No Vomiting?: No Indigestion/heartburn?: No Diarrhea?: No Constipation?: No  Constitutional Fever: No Night sweats?: No Weight loss?: No Fatigue?: No  Skin Skin rash/lesions?: No Itching?: No  Eyes Blurred vision?: No Double vision?: No  Ears/Nose/Throat Sore throat?: No Sinus problems?: No  Hematologic/Lymphatic Swollen glands?: No Easy bruising?: No  Cardiovascular Leg swelling?: No Chest pain?: No  Respiratory Cough?: No Shortness of breath?: No  Endocrine Excessive thirst?: No  Musculoskeletal Back pain?: No Joint pain?: No  Neurological Headaches?: No Dizziness?:  No  Psychologic Depression?: No Anxiety?: No  Physical Exam: BP (!) 167/84 (BP Location: Left Arm, Patient Position: Sitting, Cuff Size: Large)   Pulse 98   Ht 6\' 2"  (1.88 m)   Wt 246 lb (111.6 kg)   BMI 31.58 kg/m   Constitutional:  Alert and oriented, No acute distress. HEENT: [place] AT, moist mucus membranes.  Trachea midline, no masses. Cardiovascular: No clubbing, cyanosis, or edema. Respiratory: Normal respiratory effort, no increased work of breathing. GI: Abdomen is soft, nontender, nondistended, no abdominal masses GU: No CVA tenderness.  Prostate 50 g, smooth without nodules Lymph: No cervical or inguinal lymphadenopathy. Skin: No rashes, bruises or suspicious lesions. Neurologic: Grossly intact, no focal deficits, moving all 4 extremities. Psychiatric: Normal mood and affect.  Assessment & Plan:   62 year old male with BPH status post TURP.  He is doing well and satisfied with his voiding pattern.  He  remains on tamsulosin and finasteride.  I recommended discontinuing the tamsulosin.  He would also be fine to discontinue the finasteride however this may provide some preventative benefit on adenoma regrowth.  He would like to continue the finasteride for now.  His last testosterone injection was 1 week ago.  We will recheck a testosterone level along with a hematocrit and PSA.  Recommend annual follow-up with an interim testosterone level and hematocrit in 6 months.  Abbie Sons, [place] [address] [place], [place] [address] [phone]

## 2019-01-29 LAB — TESTOSTERONE: Testosterone: 482 ng/dL (ref 264–916)

## 2019-01-29 LAB — HEMATOCRIT: Hematocrit: 51.6 % — ABNORMAL HIGH (ref 37.5–51.0)

## 2019-01-29 LAB — PSA: Prostate Specific Ag, Serum: 1.2 ng/mL (ref 0.0–4.0)

## 2019-01-30 ENCOUNTER — Encounter: Payer: Self-pay | Admitting: Urology

## 2019-01-31 ENCOUNTER — Telehealth: Payer: Self-pay

## 2019-01-31 NOTE — Telephone Encounter (Signed)
Called pt no answer. Left detailed message for pt informing him of the information below. Advised pt to call back for questions or concerns.  

## 2019-01-31 NOTE — Telephone Encounter (Signed)
-----   Message from Abbie Sons, MD sent at 01/29/2019  1:27 PM EST ----- PSA stable at 1.2.  Testosterone level looks good at 482.  He may get less symptom variation by changing the dose to 0.7 cc weekly.  His hematocrit was elevated at 51%.  If he is able to donate blood recommend he get this done.  If unable to donate blood he needs an appointment in hematology for consideration of phlebotomy.

## 2019-02-01 ENCOUNTER — Ambulatory Visit: Payer: BLUE CROSS/BLUE SHIELD | Attending: Neurology

## 2019-02-01 DIAGNOSIS — G4733 Obstructive sleep apnea (adult) (pediatric): Secondary | ICD-10-CM | POA: Insufficient documentation

## 2019-02-01 DIAGNOSIS — G5603 Carpal tunnel syndrome, bilateral upper limbs: Secondary | ICD-10-CM | POA: Diagnosis not present

## 2019-02-09 ENCOUNTER — Other Ambulatory Visit: Payer: Self-pay

## 2019-02-10 LAB — BUN: BUN: 25 mg/dL (ref 8–27)

## 2019-02-10 LAB — HGB A1C W/O EAG: Hgb A1c MFr Bld: 7.3 % — ABNORMAL HIGH (ref 4.8–5.6)

## 2019-02-10 LAB — TESTOSTERONE,FREE AND TOTAL
Testosterone, Free: 13.5 pg/mL (ref 6.6–18.1)
Testosterone: 550 ng/dL (ref 264–916)

## 2019-02-10 LAB — CREATININE, SERUM
Creatinine, Ser: 0.83 mg/dL (ref 0.76–1.27)
GFR calc Af Amer: 107 mL/min/{1.73_m2} (ref >59)
GFR calc non Af Amer: 93 mL/min/{1.73_m2} (ref >59)

## 2019-02-14 DIAGNOSIS — M216X1 Other acquired deformities of right foot: Secondary | ICD-10-CM | POA: Diagnosis not present

## 2019-02-14 DIAGNOSIS — M7671 Peroneal tendinitis, right leg: Secondary | ICD-10-CM | POA: Diagnosis not present

## 2019-02-14 DIAGNOSIS — M7731 Calcaneal spur, right foot: Secondary | ICD-10-CM | POA: Diagnosis not present

## 2019-02-14 DIAGNOSIS — M25371 Other instability, right ankle: Secondary | ICD-10-CM | POA: Diagnosis not present

## 2019-02-14 DIAGNOSIS — M25571 Pain in right ankle and joints of right foot: Secondary | ICD-10-CM | POA: Diagnosis not present

## 2019-02-15 DIAGNOSIS — M19171 Post-traumatic osteoarthritis, right ankle and foot: Secondary | ICD-10-CM | POA: Insufficient documentation

## 2019-02-25 LAB — LIPID PANEL
Cholesterol: 188 (ref 0–200)
HDL: 33 — AB (ref 35–70)
LDL Cholesterol: 111
Triglycerides: 218 — AB (ref 40–160)

## 2019-03-10 DIAGNOSIS — M19171 Post-traumatic osteoarthritis, right ankle and foot: Secondary | ICD-10-CM | POA: Diagnosis not present

## 2019-03-10 DIAGNOSIS — M216X1 Other acquired deformities of right foot: Secondary | ICD-10-CM | POA: Diagnosis not present

## 2019-03-10 DIAGNOSIS — M25371 Other instability, right ankle: Secondary | ICD-10-CM | POA: Diagnosis not present

## 2019-03-10 DIAGNOSIS — M7671 Peroneal tendinitis, right leg: Secondary | ICD-10-CM | POA: Diagnosis not present

## 2019-03-22 DIAGNOSIS — G4730 Sleep apnea, unspecified: Secondary | ICD-10-CM | POA: Insufficient documentation

## 2019-03-22 DIAGNOSIS — K219 Gastro-esophageal reflux disease without esophagitis: Secondary | ICD-10-CM | POA: Insufficient documentation

## 2019-04-28 DIAGNOSIS — J3489 Other specified disorders of nose and nasal sinuses: Secondary | ICD-10-CM | POA: Diagnosis not present

## 2019-04-28 DIAGNOSIS — J309 Allergic rhinitis, unspecified: Secondary | ICD-10-CM | POA: Diagnosis not present

## 2019-04-28 DIAGNOSIS — G4733 Obstructive sleep apnea (adult) (pediatric): Secondary | ICD-10-CM | POA: Diagnosis not present

## 2019-05-04 DIAGNOSIS — J3489 Other specified disorders of nose and nasal sinuses: Secondary | ICD-10-CM | POA: Diagnosis not present

## 2019-05-04 DIAGNOSIS — G4733 Obstructive sleep apnea (adult) (pediatric): Secondary | ICD-10-CM | POA: Diagnosis not present

## 2019-05-05 DIAGNOSIS — M5412 Radiculopathy, cervical region: Secondary | ICD-10-CM | POA: Insufficient documentation

## 2019-05-05 DIAGNOSIS — G5601 Carpal tunnel syndrome, right upper limb: Secondary | ICD-10-CM | POA: Diagnosis not present

## 2019-05-05 DIAGNOSIS — Z6830 Body mass index (BMI) 30.0-30.9, adult: Secondary | ICD-10-CM | POA: Diagnosis not present

## 2019-05-19 ENCOUNTER — Ambulatory Visit: Payer: Self-pay | Admitting: Internal Medicine

## 2019-05-19 DIAGNOSIS — G5603 Carpal tunnel syndrome, bilateral upper limbs: Secondary | ICD-10-CM | POA: Diagnosis not present

## 2019-05-19 DIAGNOSIS — M542 Cervicalgia: Secondary | ICD-10-CM | POA: Diagnosis not present

## 2019-05-19 DIAGNOSIS — I10 Essential (primary) hypertension: Secondary | ICD-10-CM | POA: Diagnosis not present

## 2019-05-19 DIAGNOSIS — M5412 Radiculopathy, cervical region: Secondary | ICD-10-CM | POA: Diagnosis not present

## 2019-05-19 DIAGNOSIS — Z6830 Body mass index (BMI) 30.0-30.9, adult: Secondary | ICD-10-CM | POA: Diagnosis not present

## 2019-05-20 ENCOUNTER — Ambulatory Visit: Payer: Self-pay

## 2019-05-20 ENCOUNTER — Other Ambulatory Visit: Payer: Self-pay

## 2019-05-20 DIAGNOSIS — E291 Testicular hypofunction: Secondary | ICD-10-CM

## 2019-05-20 MED ORDER — TESTOSTERONE CYPIONATE 200 MG/ML IM SOLN
200.0000 mg | INTRAMUSCULAR | Status: DC
  Administered 2019-05-20 – 2019-09-26 (×9): 200 mg via INTRAMUSCULAR

## 2019-05-25 LAB — TESTOSTERONE,FREE AND TOTAL
A1c: 7.3
BUN: 25
Creatine, Serum: 0.83
Testosterone, Free: 13.5
Testosterone: 550

## 2019-05-25 LAB — TESTOSTERONE: Testosterone: 1298

## 2019-05-25 LAB — MICROALBUMIN / CREATININE URINE RATIO: Microalb Creat Ratio: 43

## 2019-05-26 ENCOUNTER — Encounter: Payer: Self-pay | Admitting: Internal Medicine

## 2019-05-26 ENCOUNTER — Other Ambulatory Visit: Payer: Self-pay

## 2019-05-26 ENCOUNTER — Ambulatory Visit: Payer: Self-pay | Admitting: Internal Medicine

## 2019-05-26 VITALS — BP 136/94 | HR 91 | Temp 97.9°F | Resp 16 | Ht 74.0 in | Wt 237.0 lb

## 2019-05-26 DIAGNOSIS — M7552 Bursitis of left shoulder: Secondary | ICD-10-CM

## 2019-05-26 DIAGNOSIS — M7582 Other shoulder lesions, left shoulder: Secondary | ICD-10-CM

## 2019-05-26 DIAGNOSIS — I10 Essential (primary) hypertension: Secondary | ICD-10-CM

## 2019-05-26 MED ORDER — LISINOPRIL 40 MG PO TABS: ORAL_TABLET | ORAL | 3 refills | Status: DC

## 2019-05-26 NOTE — Progress Notes (Signed)
S - Presents with shoulder pain, has bilateral shoulder issues and I have njected both in the past, the left last in Feb and the right last in early March. He noted after the injections, the shoulder pain is much improved and lasts often for months and more recently, the left is more problematic again and ROM more limited again with pain increased No more recent trauma could recall that increased sx's. He still works out and [employer] but noted using light weights at higher reps   His BP's have been running with diastolics in the 90 range for the past checks here since I arrived 5+ months ago and also some prior to that. He was on another med for his BP (Ziac), but that was stopped about 3 years ago.  Now on lisinopril and HCTZ for his BP. No recent CP's palp's, SOB or LE swelling.     Allergies  Allergen Reactions  . Penicillins Hives    Has patient had a PCN reaction causing immediate rash, facial/tongue/throat swelling, SOB or lightheadedness with hypotension: yes Has patient had a PCN reaction causing severe rash involving mucus membranes or skin necrosis: no Has patient had a PCN reaction that required hospitalization: no Has patient had a PCN reaction occurring within the last 10 years: no If all of the above answers are "NO", then may proceed with Cephalosporin use.      Current Outpatient Medications on File Prior to Visit  Medication Sig Dispense Refill  . finasteride (PROSCAR) 5 MG tablet Take 1 tablet (5 mg total) by mouth daily. 30 tablet 11  . fluticasone (FLONASE) 50 MCG/ACT nasal spray instill 1 to 2 sprays into each nostril once daily  0  . glipiZIDE (GLUCOTROL XL) 5 MG 24 hr tablet TK 1 T PO QD    . hydrochlorothiazide (HYDRODIURIL) 12.5 MG tablet TK 1 T PO QD IN EARLY MORNING    . lisinopril (PRINIVIL,ZESTRIL) 20 MG tablet Take 20 mg by mouth daily.    . metFORMIN (GLUCOPHAGE-XR) 500 MG 24 hr tablet TK 4 TS PO QAM  2  . montelukast (SINGULAIR) 10 MG tablet Take 10 mg  by mouth daily as needed.     Marland Kitchen omeprazole (PRILOSEC) 20 MG capsule Take 20 mg by mouth daily.    . tamsulosin (FLOMAX) 0.4 MG CAPS capsule Take 2 capsules (0.8 mg total) by mouth daily. 60 capsule 11  . testosterone cypionate (DEPOTESTOSTERONE CYPIONATE) 200 MG/ML injection Inject 100 mg into the muscle once a week.   0   Current Facility-Administered Medications on File Prior to Visit  Medication Dose Route Frequency Provider Last Rate Last Dose  . testosterone cypionate (DEPOTESTOSTERONE CYPIONATE) injection 200 mg  200 mg Intramuscular Q14 Days Towanda Malkin, MD   200 mg at 05/20/19 0841     Current snuff user, never smoker  O - NAD, masked  BP (!) 136/94 (BP Location: Right Arm, Patient Position: Sitting, Cuff Size: Large)   Pulse 91   Temp 97.9 F (36.6 C) (Oral)   Resp 16   Ht 6\' 2"  (1.88 m)   Wt 237 lb (107.5 kg)   SpO2 97%   BMI 30.43 kg/m    Recheck BP - 130/90 by me manually and recheck with machine - 127/94  Sclera anicteric Left Shoulder - ROM testing - abduction limited above shoulder level due to pain and could get to 90 degrees abduction, same with forward elevation (possibly slightly better and get to 100 degrees with this  NT with palpation at A-C joint  NT anterior joint line, NT posterior joint line  Tender at subacromial bursa region  RC testing - limited with discomfort  NT bicipital tendon anterior  Sensation intact deltoid  No radicular signs in UE Neck - no carotid bruits Car - RRR without m/g/r Pulm - CTA Ext - no LE edema  Procedure Note: Informed consent obtained, risks and benefits of procedure discussed with patient, including again concerns with repetitive injections and that they may not help as much over time, form signed and to be scanned in The area was cleansed with alcohol and betadyne The site was locally anesthetized with a topical ethyl chloride solution,  A mix of 2 cc 1% Lidocaine and 1 cc of depo-medrol - 40mg /cc was  injected using the subacromial approach in the left shoulder without incident Patient tolerated the procedure well     Ass/Plan  1.  R  L shoulder pain - prob RC tendonitis, adhesive capsulitis, and subacrom bursitis component  Patient really wanted another injection today noting how they have helped in the past, and after discussed risk/benefits, agreed to do  Ice liberally short term after can help with improvements  Discussed concerns for possible RC tear on left like he noted he has on the right (after had MRI). He may need an MRI on this shoulder pending response to above and how he is doing over time and he was aware of that  2. HTN - concern not as well controlled in recent past  discussed increase or addition and agreed to increase the lisinopril to two tabs daily and assess, He has test injections here and can get BP checked when returns for these to help follow.  Discussed goals for good control of BP, diastolic readings close to 80, and not 90 or above Trying to get home BP checks periodically can help and he noted he may get a BP machine to do this in the future.  Patient also diabetic, managed for hypogonadism and hyperlipidemic (on a statin), had a TURP for BPH.   F/u annual physical to be soon as well (had slight delay due to Covid pandemic)

## 2019-05-30 ENCOUNTER — Telehealth: Payer: Self-pay

## 2019-05-30 DIAGNOSIS — N401 Enlarged prostate with lower urinary tract symptoms: Secondary | ICD-10-CM

## 2019-05-30 MED ORDER — FINASTERIDE 5 MG PO TABS: 5.0000 mg | ORAL_TABLET | Freq: Every day | ORAL | 11 refills | Status: DC

## 2019-05-30 NOTE — Telephone Encounter (Signed)
Finasteride refill sent to [facility]

## 2019-06-02 ENCOUNTER — Other Ambulatory Visit: Payer: Self-pay

## 2019-06-02 ENCOUNTER — Ambulatory Visit: Payer: Self-pay

## 2019-06-02 VITALS — BP 130/90

## 2019-06-02 DIAGNOSIS — E291 Testicular hypofunction: Secondary | ICD-10-CM

## 2019-06-14 DIAGNOSIS — G4733 Obstructive sleep apnea (adult) (pediatric): Secondary | ICD-10-CM | POA: Diagnosis not present

## 2019-06-15 ENCOUNTER — Ambulatory Visit: Payer: Self-pay

## 2019-06-15 ENCOUNTER — Other Ambulatory Visit: Payer: Self-pay

## 2019-06-15 VITALS — BP 150/80

## 2019-06-15 DIAGNOSIS — E291 Testicular hypofunction: Secondary | ICD-10-CM

## 2019-06-17 DIAGNOSIS — G5601 Carpal tunnel syndrome, right upper limb: Secondary | ICD-10-CM | POA: Diagnosis not present

## 2019-06-20 DIAGNOSIS — M76821 Posterior tibial tendinitis, right leg: Secondary | ICD-10-CM | POA: Diagnosis not present

## 2019-06-20 DIAGNOSIS — M79671 Pain in right foot: Secondary | ICD-10-CM | POA: Diagnosis not present

## 2019-06-20 DIAGNOSIS — M7671 Peroneal tendinitis, right leg: Secondary | ICD-10-CM | POA: Diagnosis not present

## 2019-06-20 DIAGNOSIS — M25571 Pain in right ankle and joints of right foot: Secondary | ICD-10-CM | POA: Diagnosis not present

## 2019-06-20 DIAGNOSIS — M216X1 Other acquired deformities of right foot: Secondary | ICD-10-CM | POA: Diagnosis not present

## 2019-06-29 ENCOUNTER — Other Ambulatory Visit: Payer: Self-pay

## 2019-06-29 MED ORDER — LISINOPRIL 40 MG PO TABS: ORAL_TABLET | ORAL | 3 refills | Status: DC

## 2019-07-01 ENCOUNTER — Other Ambulatory Visit: Payer: Self-pay

## 2019-07-01 ENCOUNTER — Ambulatory Visit: Payer: Self-pay

## 2019-07-01 DIAGNOSIS — E291 Testicular hypofunction: Secondary | ICD-10-CM

## 2019-07-15 ENCOUNTER — Other Ambulatory Visit: Payer: Self-pay

## 2019-07-15 ENCOUNTER — Ambulatory Visit: Payer: Self-pay

## 2019-07-15 ENCOUNTER — Telehealth: Payer: Self-pay

## 2019-07-15 DIAGNOSIS — N529 Male erectile dysfunction, unspecified: Secondary | ICD-10-CM

## 2019-07-15 DIAGNOSIS — E291 Testicular hypofunction: Secondary | ICD-10-CM

## 2019-07-15 NOTE — Telephone Encounter (Signed)
Pt needs labs done cbc, free and total testosterone will call to schedule, he needs a refill too

## 2019-07-15 NOTE — Progress Notes (Signed)
Pt injection done today with the last vial will need refill before next injection. Advised would sent but need to check with MD about if labs are due.

## 2019-07-19 NOTE — Telephone Encounter (Signed)
Will need lab results before can refill testosterone. Per the records, his last injection was 05/06/2019. Is getting 1cc every other week per last refill in May and reviewed log in chart (and should not need a refill from that) His last H/H was at the upper ends of normal in March.

## 2019-07-19 NOTE — Telephone Encounter (Signed)
Spoke with pt his last injection was 07/15/19 so he will come in 07/22/19 for labs. I called pharmacy and he has a refill on his testosterone and will be ready for pick up today . Pt is aware

## 2019-07-22 ENCOUNTER — Other Ambulatory Visit: Payer: Self-pay

## 2019-07-22 DIAGNOSIS — E291 Testicular hypofunction: Secondary | ICD-10-CM

## 2019-07-22 NOTE — Progress Notes (Signed)
Works part-time at [employer] required to have a [place] screening that was done on 07/08/2019 & it was negative.  AMD

## 2019-07-24 LAB — CBC WITH DIFFERENTIAL/PLATELET
Basophils Absolute: 0 10*3/uL (ref 0.0–0.2)
Basos: 1 %
EOS (ABSOLUTE): 0.1 10*3/uL (ref 0.0–0.4)
Eos: 3 %
Hematocrit: 49.1 % (ref 37.5–51.0)
Hemoglobin: 17.2 g/dL (ref 13.0–17.7)
Immature Grans (Abs): 0 10*3/uL (ref 0.0–0.1)
Immature Granulocytes: 1 %
Lymphocytes Absolute: 1.3 10*3/uL (ref 0.7–3.1)
Lymphs: 30 %
MCH: 29.4 pg (ref 26.6–33.0)
MCHC: 35 g/dL (ref 31.5–35.7)
MCV: 84 fL (ref 79–97)
Monocytes Absolute: 0.6 10*3/uL (ref 0.1–0.9)
Monocytes: 13 %
Neutrophils Absolute: 2.3 10*3/uL (ref 1.4–7.0)
Neutrophils: 52 %
Platelets: 212 10*3/uL (ref 150–450)
RBC: 5.85 x10E6/uL — ABNORMAL HIGH (ref 4.14–5.80)
RDW: 12.5 % (ref 11.6–15.4)
WBC: 4.4 10*3/uL (ref 3.4–10.8)

## 2019-07-24 LAB — TESTOSTERONE,FREE AND TOTAL
Testosterone, Free: 8.9 pg/mL (ref 6.6–18.1)
Testosterone: 458 ng/dL (ref 264–916)

## 2019-07-25 NOTE — Progress Notes (Signed)
Yes, he can remain on the same dose. Has urology appt soon as well.

## 2019-07-25 NOTE — Progress Notes (Signed)
Routed to Urologist

## 2019-07-29 ENCOUNTER — Ambulatory Visit: Payer: Self-pay

## 2019-07-29 ENCOUNTER — Other Ambulatory Visit: Payer: Self-pay

## 2019-07-29 ENCOUNTER — Telehealth: Payer: Self-pay

## 2019-07-29 DIAGNOSIS — E291 Testicular hypofunction: Secondary | ICD-10-CM

## 2019-07-29 NOTE — Telephone Encounter (Signed)
Informed Jar[place] of 07/22/2019 Testosterone lab results.  AMD

## 2019-08-02 HISTORY — PX: CARPAL TUNNEL RELEASE: SHX101

## 2019-08-10 ENCOUNTER — Telehealth: Payer: Self-pay | Admitting: Internal Medicine

## 2019-08-10 NOTE — Telephone Encounter (Signed)
Not taking new patients.

## 2019-08-10 NOTE — Telephone Encounter (Signed)
Patient notified. Scheduled with a different provider.

## 2019-08-10 NOTE — Telephone Encounter (Signed)
Please advise 

## 2019-08-10 NOTE — Telephone Encounter (Signed)
This person was a pt in the past and went to [facility]. He will not be with the [place]. Wants to know if Dr. Rosanna Randy will take him again as a pt?  Please call back to let him know asap.  Thanks, [employer]

## 2019-08-12 ENCOUNTER — Ambulatory Visit: Payer: Self-pay

## 2019-08-12 ENCOUNTER — Other Ambulatory Visit: Payer: Self-pay

## 2019-08-12 VITALS — BP 128/76

## 2019-08-12 DIAGNOSIS — E291 Testicular hypofunction: Secondary | ICD-10-CM

## 2019-08-23 ENCOUNTER — Telehealth: Payer: Self-pay | Admitting: Internal Medicine

## 2019-08-23 MED ORDER — DOXYCYCLINE HYCLATE 100 MG PO TABS: 100.0000 mg | ORAL_TABLET | Freq: Two times a day (BID) | ORAL | 0 refills | Status: DC

## 2019-08-23 NOTE — Telephone Encounter (Addendum)
In the last 24 hours have you experienced any of these symptoms  Difficulty breathing  no  Nasal congestion  yes  New cough  no  Runny nose  no  Shortness of breath  no  New sore throat  no  Unexplained body aches  no  Nausea or vomiting  no  Diarrhea  no  Loss of taste or smell  no  Fever (temp>100 F/37.8 C) or chills 98/ no chills  Exposure:   Have you been in close contact with someone with confirmed diagnosis of COVID or person under going testing in past 14 days?  no   Have you been tested for COVID? If yes date, location, results in known  Yes Aug. neg   Have you been living in the same home as a person with confirmed diagnosis of COVID or a person undergoing testing? (household contact)   Have you been diagnosed with COVID or are you waiting on test results?  no   Have you traveled anywhere in the past 4 weeks? If yes where  No  Retired   Lives with wife  Sx started on Sunday 20th. Nasal congestion and sinus pressure. He states its his usual fall head cold. He is taking sinus pain and pressure pills. He doesn't recall the name of the meds. He is due at work at 3 today, said he will not be able to answer phone then.

## 2019-08-23 NOTE — Telephone Encounter (Signed)
Message reviewed. Increased sinus pains and heavy congestion in sinus region. No fevers. No cough presently. Denied any loss of taste/smell and no Covid exposure concerns. Retired and lives with wife. Has been taking OTC meds since Sat/Sun when sx's started. Mucinex prn. Takes flonase every day.  Noted when gets this, often goes into his chest and needs an abx to help. He has allergy to PCN and noted Z-pak not work for him. He had doxycycline in past which did help.   Felt reasonable to add doxycycline - 100mg  bid to his mucinex and flonase regimen presently and assess response. Not likely a Covid concern and he was very much in agreement and if not improving in short term, rec'ed call to follow-up to re-assess.

## 2019-08-23 NOTE — Telephone Encounter (Signed)
Contacted [facility] for additional information.  States I get this every year at this same time.  It usually takes an antibiotic to keep it from going from my sinuses/head down into my chest/lungs.   Nasal drainage - whitish to clear Pressure under eyes & some discomfort to left ear. Uses Flonase 2x/day. No other antihistamines like Zyrtec, Allegra or Claritin. Taking OTC Multisymptom Cold/Sinus product.  AMD

## 2019-08-26 ENCOUNTER — Ambulatory Visit: Payer: Self-pay

## 2019-08-26 ENCOUNTER — Other Ambulatory Visit: Payer: Self-pay

## 2019-08-26 DIAGNOSIS — E291 Testicular hypofunction: Secondary | ICD-10-CM

## 2019-08-28 DIAGNOSIS — Z20828 Contact with and (suspected) exposure to other viral communicable diseases: Secondary | ICD-10-CM | POA: Diagnosis not present

## 2019-09-09 ENCOUNTER — Other Ambulatory Visit: Payer: Self-pay

## 2019-09-09 ENCOUNTER — Ambulatory Visit: Payer: Self-pay

## 2019-09-09 VITALS — BP 135/90 | HR 97 | Wt 238.0 lb

## 2019-09-09 DIAGNOSIS — Z23 Encounter for immunization: Secondary | ICD-10-CM

## 2019-09-12 ENCOUNTER — Ambulatory Visit: Payer: Self-pay

## 2019-09-13 ENCOUNTER — Other Ambulatory Visit: Payer: Self-pay

## 2019-09-13 DIAGNOSIS — E291 Testicular hypofunction: Secondary | ICD-10-CM

## 2019-09-13 DIAGNOSIS — N401 Enlarged prostate with lower urinary tract symptoms: Secondary | ICD-10-CM

## 2019-09-14 LAB — HEMATOCRIT: Hematocrit: 51.8 % — ABNORMAL HIGH (ref 37.5–51.0)

## 2019-09-14 LAB — TESTOSTERONE: Testosterone: 756 ng/dL (ref 264–916)

## 2019-09-14 LAB — PSA: Prostate Specific Ag, Serum: 1.5 ng/mL (ref 0.0–4.0)

## 2019-09-15 ENCOUNTER — Other Ambulatory Visit: Payer: Self-pay

## 2019-09-15 ENCOUNTER — Ambulatory Visit (INDEPENDENT_AMBULATORY_CARE_PROVIDER_SITE_OTHER): Payer: 59 | Admitting: Urology

## 2019-09-15 ENCOUNTER — Encounter: Payer: Self-pay | Admitting: Urology

## 2019-09-15 DIAGNOSIS — N401 Enlarged prostate with lower urinary tract symptoms: Secondary | ICD-10-CM | POA: Diagnosis not present

## 2019-09-15 MED ORDER — TAMSULOSIN HCL 0.4 MG PO CAPS: 0.4000 mg | ORAL_CAPSULE | Freq: Every day | ORAL | 3 refills | Status: DC

## 2019-09-15 NOTE — Progress Notes (Signed)
09/15/2019 1:38 PM   Hyde Percle Senteno Sep 06, 1956 [id number]  Referring provider: No referring provider defined for this encounter.  Chief Complaint  Patient presents with  . Hypogonadism    30month follow up    Urologic history: 1.  BPH with lower urinary tract symptoms             -Prior medical management             -UDS consistent with obstruction.             -TURP by Dr. Pilar Jarvis 01/2018; 7 g, benign   2.  Hypogonadism              -On TRT   HPI: 63 y.o. male presents for follow-up.  I last saw him February 2020.  He was still taking tamsulosin and finasteride.  He discontinued both however does take tamsulosin periodically.  He does feel that he voids better on tamsulosin.  He remains on testosterone cypionate 200 mg every 2 weeks.  Blood work drawn earlier this week remarkable for a testosterone level of 756, PSA 1.5 and hematocrit 51.8.  Denies breast tenderness/enlargement.  PMH: Past Medical History:  Diagnosis Date  . BPH (benign prostatic hyperplasia)   . Bronchitis   . Complication of anesthesia    hard to wake up  . DM (diabetes mellitus), type 2 ([place])   . DVT (deep venous thrombosis) ([place])    post op  . ED (erectile dysfunction)   . GERD (gastroesophageal reflux disease)   . Hyperlipemia   . Hypertension   . Hypogonadism in male   . Nocturia   . Right ankle instability   . Sleep apnea   . TB lung, latent   . Urinary frequency     Surgical History: Past Surgical History:  Procedure Laterality Date  . ANKLE SURGERY Left    torn ligament  . COLONOSCOPY  2009  . KNEE ARTHROSCOPY Left   . right ankle ruptured tendon surgery  2008  . TRANSURETHRAL RESECTION OF PROSTATE N/A 02/19/2018   Procedure: TRANSURETHRAL RESECTION OF THE PROSTATE (TURP);  Surgeon: [name], MD;  Location: [facility] ORS;  Service: Urology;  Laterality: N/A;    Home Medications:  Allergies as of 09/15/2019      Reactions   Penicillins Hives   Has patient had a PCN  reaction causing immediate rash, facial/tongue/throat swelling, SOB or lightheadedness with hypotension: yes Has patient had a PCN reaction causing severe rash involving mucus membranes or skin necrosis: no Has patient had a PCN reaction that required hospitalization: no Has patient had a PCN reaction occurring within the last 10 years: no If all of the above answers are "NO", then may proceed with Cephalosporin use.      Medication List       Accurate as of September 15, 2019  1:38 PM. If you have any questions, ask your nurse or doctor.        STOP taking these medications   doxycycline 100 MG tablet Commonly known as: VIBRA-TABS Stopped by: Abbie Sons, MD   finasteride 5 MG tablet Commonly known as: PROSCAR Stopped by: Abbie Sons, MD     TAKE these medications   fluticasone 50 MCG/ACT nasal spray Commonly known as: FLONASE instill 1 to 2 sprays into each nostril once daily   glipiZIDE 5 MG 24 hr tablet Commonly known as: GLUCOTROL XL TK 1 T PO QD   hydrochlorothiazide 12.5 MG tablet Commonly known  as: HYDRODIURIL TK 1 T PO QD IN EARLY MORNING   lisinopril 40 MG tablet Commonly known as: ZESTRIL Take one tablet daily   metFORMIN 500 MG 24 hr tablet Commonly known as: GLUCOPHAGE-XR TK 4 TS PO QAM   montelukast 10 MG tablet Commonly known as: SINGULAIR Take 10 mg by mouth daily as needed.   omeprazole 20 MG capsule Commonly known as: PRILOSEC Take 20 mg by mouth daily.   tamsulosin 0.4 MG Caps capsule Commonly known as: FLOMAX Take 1 capsule (0.4 mg total) by mouth daily. What changed: how much to take Changed by: Abbie Sons, MD       Allergies:  Allergies  Allergen Reactions  . Penicillins Hives    Has patient had a PCN reaction causing immediate rash, facial/tongue/throat swelling, SOB or lightheadedness with hypotension: yes Has patient had a PCN reaction causing severe rash involving mucus membranes or skin necrosis: no Has patient  had a PCN reaction that required hospitalization: no Has patient had a PCN reaction occurring within the last 10 years: no If all of the above answers are "NO", then may proceed with Cephalosporin use.     Family History: Family History  Problem Relation Age of Onset  . Heart disease Father   . Hypertension Father   . Prostate cancer Father   . Heart disease Mother   . Breast cancer Mother     Social History:  reports that he has never smoked. His smokeless tobacco use includes snuff. He reports that he does not drink alcohol or use drugs.  ROS: UROLOGY Frequent Urination?: No Hard to postpone urination?: No Burning/pain with urination?: No Get up at night to urinate?: No Leakage of urine?: No Urine stream starts and stops?: No Trouble starting stream?: No Do you have to strain to urinate?: No Blood in urine?: No Urinary tract infection?: No Sexually transmitted disease?: No Injury to kidneys or bladder?: No Painful intercourse?: No Weak stream?: No Erection problems?: No Penile pain?: No  Gastrointestinal Nausea?: No Vomiting?: No Indigestion/heartburn?: No Diarrhea?: No Constipation?: No  Constitutional Fever: No Night sweats?: No Weight loss?: No Fatigue?: No  Skin Skin rash/lesions?: No Itching?: No  Eyes Blurred vision?: No Double vision?: No  Ears/Nose/Throat Sore throat?: No Sinus problems?: No  Hematologic/Lymphatic Swollen glands?: No Easy bruising?: No  Cardiovascular Leg swelling?: No Chest pain?: No  Respiratory Cough?: No Shortness of breath?: No  Endocrine Excessive thirst?: No  Musculoskeletal Back pain?: No Joint pain?: No  Neurological Headaches?: No Dizziness?: No  Psychologic Depression?: No Anxiety?: No  Physical Exam: BP 117/72   Pulse (!) 106   Ht 6\' 2"  (1.88 m)   Wt 235 lb (106.6 kg)   BMI 30.17 kg/m   Constitutional:  Alert and oriented, No acute distress. HEENT: [place] AT, moist mucus membranes.   Trachea midline, no masses. Cardiovascular: No clubbing, cyanosis, or edema. Respiratory: Normal respiratory effort, no increased work of breathing. GI: Abdomen is soft, nontender, nondistended, no abdominal masses GU: Prostate 40 g, smooth without nodules Lymph: No cervical or inguinal lymphadenopathy. Skin: No rashes, bruises or suspicious lesions. Neurologic: Grossly intact, no focal deficits, moving all 4 extremities. Psychiatric: Normal mood and affect.   Assessment & Plan:    - Hypogonadism Stable symptoms on TRT.  Hematocrit mildly elevated and will need to monitor to keep less than 54%.  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  He may want to learn to inject.  Follow-up testosterone and hematocrit in 6 months with office visit 1 year.  - BPH with lower urinary tract symptoms He does feel he voids better on tamsulosin and would like Rx sent to pharmacy.   Abbie Sons, [place] [address], [place], [place] [address] [phone]

## 2019-09-22 ENCOUNTER — Ambulatory Visit (INDEPENDENT_AMBULATORY_CARE_PROVIDER_SITE_OTHER): Payer: 59 | Admitting: Family Medicine

## 2019-09-22 ENCOUNTER — Encounter: Payer: Self-pay | Admitting: Family Medicine

## 2019-09-22 ENCOUNTER — Other Ambulatory Visit: Payer: Self-pay

## 2019-09-22 ENCOUNTER — Ambulatory Visit: Payer: Self-pay

## 2019-09-22 VITALS — BP 138/86 | HR 84 | Temp 97.1°F | Wt 237.0 lb

## 2019-09-22 DIAGNOSIS — Z1211 Encounter for screening for malignant neoplasm of colon: Secondary | ICD-10-CM | POA: Diagnosis not present

## 2019-09-22 DIAGNOSIS — I10 Essential (primary) hypertension: Secondary | ICD-10-CM | POA: Insufficient documentation

## 2019-09-22 DIAGNOSIS — E663 Overweight: Secondary | ICD-10-CM | POA: Insufficient documentation

## 2019-09-22 DIAGNOSIS — R350 Frequency of micturition: Secondary | ICD-10-CM | POA: Diagnosis not present

## 2019-09-22 DIAGNOSIS — N401 Enlarged prostate with lower urinary tract symptoms: Secondary | ICD-10-CM | POA: Diagnosis not present

## 2019-09-22 DIAGNOSIS — Z114 Encounter for screening for human immunodeficiency virus [HIV]: Secondary | ICD-10-CM

## 2019-09-22 DIAGNOSIS — Z6830 Body mass index (BMI) 30.0-30.9, adult: Secondary | ICD-10-CM | POA: Diagnosis not present

## 2019-09-22 DIAGNOSIS — E785 Hyperlipidemia, unspecified: Secondary | ICD-10-CM

## 2019-09-22 DIAGNOSIS — E669 Obesity, unspecified: Secondary | ICD-10-CM | POA: Diagnosis not present

## 2019-09-22 DIAGNOSIS — Z1159 Encounter for screening for other viral diseases: Secondary | ICD-10-CM | POA: Diagnosis not present

## 2019-09-22 DIAGNOSIS — R69 Illness, unspecified: Secondary | ICD-10-CM | POA: Diagnosis not present

## 2019-09-22 DIAGNOSIS — E1169 Type 2 diabetes mellitus with other specified complication: Secondary | ICD-10-CM

## 2019-09-22 DIAGNOSIS — E1159 Type 2 diabetes mellitus with other circulatory complications: Secondary | ICD-10-CM | POA: Insufficient documentation

## 2019-09-22 DIAGNOSIS — E119 Type 2 diabetes mellitus without complications: Secondary | ICD-10-CM | POA: Insufficient documentation

## 2019-09-22 DIAGNOSIS — I152 Hypertension secondary to endocrine disorders: Secondary | ICD-10-CM | POA: Insufficient documentation

## 2019-09-22 NOTE — Assessment & Plan Note (Signed)
Discussed importance of healthy weight management Discussed diet and exercise  

## 2019-09-22 NOTE — Progress Notes (Signed)
Patient: Jonathan Fields, Male    DOB: 12/10/1955, 63 y.o.   MRN: [id number] Visit Date: 09/22/2019  Today's Provider: Lavon Paganini, MD   Chief Complaint  Patient presents with  . Establish Care   Subjective:     Establish care Jonathan Fields is a 63 y.o. male who presents today to establish care. He feels fairly well. He reports exercising some. He reports he is sleeping poorly.  Previous PCP was [facility]  OSA diagnosed ~20 years ago Used CPAP for [employer] he stopped and did not tolerate  GERD: Taking Prilosec daily. Well controlled.  History of post-op DVT in 2008. Took anti-coag x3 months. Not on one currently  BPH: Had TURP. Symptoms are much improved.  Still taking flomax, only intermittently.  Followed by urology  Hypogonadism/ED: Taking Testosterone biweekly.  Followed by Urology.  Patient reports that he has a lot of sinus congestion and nasal drainage.  He states he gets sinus infections 4 times yearly.  He is using Flonase twice daily.  He is unsure if he still taking Singulair.  T2DM - Checking BG at home: no - Medications: unsure. States he is taking Metformin, but unsure about glipizide - Compliance: unknown as he does not know his medications - Diet: refuses to answer - eye exam: >5 yrs ago - foot exam: needs - microalbumin: Medication list includes lisinopril, but patient cannot reliably tell me if he takes this or not - denies symptoms of hypoglycemia, polyuria, polydipsia, numbness extremities, foot ulcers/trauma   HTN: - Medications: Medication list lists lisinopril 40 mg daily and HCTZ 12.5 mg daily - Compliance: Unclear as patient states that he is only taking 1 blood pressure medication but cannot tell me which 1 - Checking BP at home: No - Denies any SOB, CP, vision changes, LE edema, medication SEs, or symptoms of hypotension  -----------------------------------------------------------------   Review of  Systems  Constitutional: Negative.   HENT: Negative.   Eyes: Negative.   Respiratory: Negative.   Cardiovascular: Negative.   Gastrointestinal: Negative.   Endocrine: Negative.   Genitourinary: Negative.   Musculoskeletal: Negative.   Skin: Negative.   Allergic/Immunologic: Negative.   Neurological: Negative.   Hematological: Negative.   Psychiatric/Behavioral: Negative.     Social History      He  reports that he has never smoked. His smokeless tobacco use includes snuff. He reports current alcohol use. He reports that he does not use drugs.       Social History   Socioeconomic History  . Marital status: Married    Spouse name: Not on file  . Number of children: 0  . Years of education: Not on file  . Highest education level: Not on file  Occupational History  . Not on file  Social Needs  . Financial resource strain: Not on file  . Food insecurity    Worry: Not on file    Inability: Not on file  . Transportation needs    Medical: Not on file    Non-medical: Not on file  Tobacco Use  . Smoking status: Never Smoker  . Smokeless tobacco: Current User    Types: Snuff  . Tobacco comment: tobaco pouches that produce juice  Substance and Sexual Activity  . Alcohol use: Yes    Comment: socially, nothing every week  . Drug use: No  . Sexual activity: Yes    Partners: Female    Birth control/protection: None  Lifestyle  . Physical  activity    Days per week: Not on file    Minutes per session: Not on file  . Stress: Not on file  Relationships  . Social Herbalist on phone: Not on file    Gets together: Not on file    Attends religious service: Not on file    Active member of club or organization: Not on file    Attends meetings of clubs or organizations: Not on file    Relationship status: Not on file  Other Topics Concern  . Not on file  Social History Narrative  . Not on file    Past Medical History:  Diagnosis Date  . BPH (benign prostatic  hyperplasia)   . Bronchitis   . Complication of anesthesia    hard to wake up  . DM (diabetes mellitus), type 2 ([place])   . DVT (deep venous thrombosis) ([place])    post op  . ED (erectile dysfunction)   . GERD (gastroesophageal reflux disease)   . Hyperlipemia   . Hypertension   . Hypogonadism in male   . Nocturia   . Right ankle instability   . Sleep apnea   . TB lung, latent   . Urinary frequency      Patient Active Problem List   Diagnosis Date Noted  . Obesity 09/22/2019  . Essential hypertension 09/22/2019  . T2DM (type 2 diabetes mellitus) ([place]) 09/22/2019  . Acquired right hindfoot varus 11/17/2018  . BPH (benign prostatic hyperplasia) 02/19/2018  . Varicose veins of both lower extremities with pain 04/09/2017  . Chronic venous insufficiency 04/09/2017  . Right ankle instability 02/21/2013    Past Surgical History:  Procedure Laterality Date  . ANKLE SURGERY Left    torn ligament  . CARPAL TUNNEL RELEASE Right 08/2019  . COLONOSCOPY  2009  . KNEE ARTHROSCOPY Left   . right ankle ruptured tendon surgery  2008  . TRANSURETHRAL RESECTION OF PROSTATE N/A 02/19/2018   Procedure: TRANSURETHRAL RESECTION OF THE PROSTATE (TURP);  Surgeon: [name], MD;  Location: [facility] ORS;  Service: Urology;  Laterality: N/A;    Family History        Family Status  Relation Name Status  . Father  Deceased  . Mother  Alive  . Neg Hx  (Not Specified)        His family history includes Breast cancer in his mother; Heart disease in his father and mother; Hypertension in his father; Prostate cancer in his father. There is no history of Colon cancer.      Allergies  Allergen Reactions  . Penicillins Hives    Has patient had a PCN reaction causing immediate rash, facial/tongue/throat swelling, SOB or lightheadedness with hypotension: yes Has patient had a PCN reaction causing severe rash involving mucus membranes or skin necrosis: no Has patient had a PCN reaction that  required hospitalization: no Has patient had a PCN reaction occurring within the last 10 years: no If all of the above answers are "NO", then may proceed with Cephalosporin use.      Current Outpatient Medications:  .  fluticasone (FLONASE) 50 MCG/ACT nasal spray, instill 1 to 2 sprays into each nostril once daily, Disp: , Rfl: 0 .  glipiZIDE (GLUCOTROL XL) 5 MG 24 hr tablet, TK 1 T PO QD, Disp: , Rfl:  .  lisinopril (ZESTRIL) 40 MG tablet, Take one tablet daily, Disp: 90 tablet, Rfl: 3 .  metFORMIN (GLUCOPHAGE-XR) 500 MG 24 hr tablet, TK 4  TS PO QAM, Disp: , Rfl: 2 .  montelukast (SINGULAIR) 10 MG tablet, Take 10 mg by mouth daily as needed. , Disp: , Rfl:  .  omeprazole (PRILOSEC) 20 MG capsule, Take 20 mg by mouth daily., Disp: , Rfl:  .  tamsulosin (FLOMAX) 0.4 MG CAPS capsule, Take 1 capsule (0.4 mg total) by mouth daily., Disp: 90 capsule, Rfl: 3  Current Facility-Administered Medications:  .  testosterone cypionate (DEPOTESTOSTERONE CYPIONATE) injection 200 mg, 200 mg, Intramuscular, Q14 Days, Towanda Malkin, MD, 200 mg at 09/09/19 1330   Patient Care Team: Virginia Crews, MD as PCP - General (Family Medicine)    Objective:    Vitals: BP 138/86 (BP Location: Right Arm, Patient Position: Sitting, Cuff Size: Large)   Pulse 84   Temp (!) 97.1 F (36.2 C) (Temporal)   Wt 237 lb (107.5 kg)   SpO2 97%   BMI 30.43 kg/m    Vitals:   09/22/19 1025  BP: 138/86  Pulse: 84  Temp: (!) 97.1 F (36.2 C)  TempSrc: Temporal  SpO2: 97%  Weight: 237 lb (107.5 kg)     Physical Exam Vitals signs reviewed.  Constitutional:      General: He is not in acute distress.    Appearance: Normal appearance. He is well-developed. He is not diaphoretic.  HENT:     Head: Normocephalic and atraumatic.     Right Ear: Tympanic membrane, ear canal and external ear normal.     Left Ear: Tympanic membrane, ear canal and external ear normal.  Eyes:     General: No scleral icterus.     Conjunctiva/sclera: Conjunctivae normal.     Pupils: Pupils are equal, round, and reactive to light.  Neck:     Musculoskeletal: Neck supple.     Thyroid: No thyromegaly.  Cardiovascular:     Rate and Rhythm: Normal rate and regular rhythm.     Pulses: Normal pulses.     Heart sounds: Normal heart sounds. No murmur.  Pulmonary:     Effort: Pulmonary effort is normal. No respiratory distress.     Breath sounds: Normal breath sounds. No wheezing or rales.  Abdominal:     General: There is no distension.     Palpations: Abdomen is soft.     Tenderness: There is no abdominal tenderness.  Musculoskeletal:        General: No deformity.     Right lower leg: No edema.     Left lower leg: No edema.  Lymphadenopathy:     Cervical: No cervical adenopathy.  Skin:    General: Skin is warm and dry.     Capillary Refill: Capillary refill takes less than 2 seconds.     Findings: No rash.  Neurological:     Mental Status: He is alert and oriented to person, place, and time. Mental status is at baseline.     Gait: Gait normal.  Psychiatric:        Mood and Affect: Mood normal.        Behavior: Behavior normal.     Depression Screen PHQ 2/9 Scores 09/22/2019  PHQ - 2 Score 1  PHQ- 9 Score 3      Assessment & Plan:     Establish care  Exercise Activities and Dietary recommendations Goals   None     Immunization History  Administered Date(s) Administered  . Influenza,inj,Quad PF,6+ Mos 09/09/2019  . Tdap 02/26/2016    Health Maintenance  Topic Date Due  .  Hepatitis C Screening  1956/06/01  . FOOT EXAM  10/11/1964  . OPHTHALMOLOGY EXAM  10/11/1964  . HIV Screening  10/11/1969  . COLONOSCOPY  10/11/2004  . HEMOGLOBIN A1C  08/12/2019  . TETANUS/TDAP  02/25/2026  . INFLUENZA VACCINE  Completed     Discussed health benefits of physical activity, and encouraged him to engage in regular exercise appropriate for his age and condition.    He believes that his last  colonoscopy was more than 10 years ago.  He is not sure why he had this done.  He is open to referral to GI for repeat colonoscopy --------------------------------------------------------------------  Problem List Items Addressed This Visit      Cardiovascular and Mediastinum   Essential hypertension - Primary    Well controlled Continue current medications, but need to clarify more with patient about which medication he is actually taking Recheck metabolic panel F/u in 3 months       Relevant Orders   Comprehensive metabolic panel     Endocrine   T2DM (type 2 diabetes mellitus) ([place])    Last A1c from more than 6 months ago was 7.3 Patient is unclear which medications he is taking in which he is not, but seems he is at least on Metformin He is not currently on a statin, and likely needs 1 Foot exam at next visit as he declines today because he is in a hurry Referral to ophthalmology placed today We will need to catch up on vaccinations at next visit If not on lisinopril, will need microalbumin at next visit Recheck A1c, CMP, lipid panel      Relevant Orders   Hemoglobin A1c   Ambulatory referral to Ophthalmology     Genitourinary   BPH (benign prostatic hyperplasia)    S/p TURP Followed by Urology  Flomax prn        Other   Obesity    Discussed importance of healthy weight management Discussed diet and exercise        Other Visit Diagnoses    Screening for HIV (human immunodeficiency virus)       Relevant Orders   HIV antibody (with reflex)   Need for hepatitis C screening test       Relevant Orders   Hepatitis C Antibody   Hyperlipidemia associated with type 2 diabetes mellitus ([place])       Relevant Orders   Lipid panel   Comprehensive metabolic panel   Screen for colon cancer       Relevant Orders   Ambulatory referral to Gastroenterology       Return in about 3 months (around 12/23/2019) for CPE.   The entirety of the information documented in the  History of Present Illness, Review of Systems and Physical Exam were personally obtained by me. Portions of this information were initially documented by Ashley Royalty, CMA and reviewed by me for thoroughness and accuracy.    Franklin Clapsaddle, Dionne Bucy, MD MPH [facility]

## 2019-09-22 NOTE — Assessment & Plan Note (Signed)
S/p TURP Followed by Urology  Flomax prn

## 2019-09-22 NOTE — Assessment & Plan Note (Signed)
Last A1c from more than 6 months ago was 7.3 Patient is unclear which medications he is taking in which he is not, but seems he is at least on Metformin He is not currently on a statin, and likely needs 1 Foot exam at next visit as he declines today because he is in a hurry Referral to ophthalmology placed today We will need to catch up on vaccinations at next visit If not on lisinopril, will need microalbumin at next visit Recheck A1c, CMP, lipid panel

## 2019-09-22 NOTE — Assessment & Plan Note (Signed)
Well controlled Continue current medications, but need to clarify more with patient about which medication he is actually taking Recheck metabolic panel F/u in 3 months

## 2019-09-26 ENCOUNTER — Ambulatory Visit: Payer: Self-pay

## 2019-09-26 ENCOUNTER — Other Ambulatory Visit: Payer: Self-pay

## 2019-09-26 DIAGNOSIS — E291 Testicular hypofunction: Secondary | ICD-10-CM

## 2019-10-03 ENCOUNTER — Ambulatory Visit: Payer: BLUE CROSS/BLUE SHIELD

## 2019-10-04 ENCOUNTER — Other Ambulatory Visit: Payer: Self-pay

## 2019-10-04 ENCOUNTER — Encounter: Payer: Self-pay | Admitting: *Deleted

## 2019-10-04 ENCOUNTER — Encounter (INDEPENDENT_AMBULATORY_CARE_PROVIDER_SITE_OTHER): Payer: 59 | Admitting: Urology

## 2019-10-04 MED ORDER — TESTOSTERONE CYPIONATE 200 MG/ML IM SOLN: 200.0000 mg | INTRAMUSCULAR | 0 refills | Status: DC

## 2019-10-07 ENCOUNTER — Other Ambulatory Visit: Payer: Self-pay

## 2019-10-07 ENCOUNTER — Ambulatory Visit: Payer: Self-pay

## 2019-10-11 ENCOUNTER — Other Ambulatory Visit: Payer: Self-pay

## 2019-10-11 ENCOUNTER — Ambulatory Visit (INDEPENDENT_AMBULATORY_CARE_PROVIDER_SITE_OTHER): Payer: 59 | Admitting: Family Medicine

## 2019-10-11 DIAGNOSIS — E291 Testicular hypofunction: Secondary | ICD-10-CM | POA: Diagnosis not present

## 2019-10-11 MED ORDER — TESTOSTERONE CYPIONATE 200 MG/ML IM SOLN
200.0000 mg | Freq: Once | INTRAMUSCULAR | Status: AC
  Administered 2019-10-11: 200 mg via INTRAMUSCULAR

## 2019-10-11 NOTE — Progress Notes (Signed)
Testosterone IM Injection  Due to Hypogonadism patient is present today for a Testosterone Injection.  Medication: Testosterone Cypionate Dose: 44ml Location: left upper outer buttocks Lot: [id number] Exp:05/22  Patient tolerated well, no complications were noted  Preformed by: Elberta Leatherwood, CMA  Follow up: 2 weeks

## 2019-10-11 NOTE — Addendum Note (Signed)
Addended by: Kyra Manges on: 10/11/2019 09:13 AM   Modules accepted: Level of Service

## 2019-10-25 ENCOUNTER — Ambulatory Visit: Payer: 59 | Admitting: Urology

## 2019-10-25 ENCOUNTER — Encounter: Payer: Self-pay | Admitting: Urology

## 2019-10-26 ENCOUNTER — Ambulatory Visit: Payer: 59 | Admitting: Urology

## 2019-10-30 NOTE — Progress Notes (Signed)
IM Injection  Patient did not have the testosterone cypionate with him, so I could not instruct him on how to inject.

## 2019-10-31 ENCOUNTER — Ambulatory Visit (INDEPENDENT_AMBULATORY_CARE_PROVIDER_SITE_OTHER): Payer: BC Managed Care – PPO | Admitting: Urology

## 2019-10-31 ENCOUNTER — Other Ambulatory Visit: Payer: Self-pay

## 2019-10-31 DIAGNOSIS — E349 Endocrine disorder, unspecified: Secondary | ICD-10-CM

## 2019-11-01 ENCOUNTER — Ambulatory Visit (INDEPENDENT_AMBULATORY_CARE_PROVIDER_SITE_OTHER): Payer: 59 | Admitting: *Deleted

## 2019-11-01 DIAGNOSIS — E349 Endocrine disorder, unspecified: Secondary | ICD-10-CM | POA: Diagnosis not present

## 2019-11-01 MED ORDER — TESTOSTERONE CYPIONATE 200 MG/ML IM SOLN
200.0000 mg | Freq: Once | INTRAMUSCULAR | Status: AC
  Administered 2019-11-01: 200 mg via INTRAMUSCULAR

## 2019-11-01 NOTE — Progress Notes (Signed)
  Due to Hypogonadism patient is present today for a Testosterone Injection.  Medication: Testosterone Cypionate Dose: 25ml Location: left upper outer buttocks Lot: [id number] Exp:03/2021  Patient tolerated well, no complications were noted  Preformed by: Elberta Leatherwood, CMA  Follow up: 2 weeks

## 2019-11-14 ENCOUNTER — Ambulatory Visit (INDEPENDENT_AMBULATORY_CARE_PROVIDER_SITE_OTHER): Payer: BC Managed Care – PPO

## 2019-11-14 ENCOUNTER — Other Ambulatory Visit: Payer: Self-pay

## 2019-11-14 DIAGNOSIS — E291 Testicular hypofunction: Secondary | ICD-10-CM | POA: Diagnosis not present

## 2019-11-14 MED ORDER — TESTOSTERONE CYPIONATE 200 MG/ML IM SOLN
200.0000 mg | Freq: Once | INTRAMUSCULAR | Status: AC
  Administered 2019-11-14: 200 mg via INTRAMUSCULAR

## 2019-11-14 NOTE — Progress Notes (Signed)
Testosterone IM Injection  Due to Hypogonadism patient is present today for a Testosterone Injection.  Medication: Testosterone Cypionate Dose: 38ml Location: right upper outer buttocks Lot: [id number] Exp:05/2021  Patient tolerated well, no complications were noted  Preformed by: Fonnie Jarvis, CMA  Follow up: 2wk

## 2019-11-15 ENCOUNTER — Ambulatory Visit: Payer: 59

## 2019-11-29 ENCOUNTER — Ambulatory Visit: Payer: BC Managed Care – PPO | Admitting: Urology

## 2019-11-30 ENCOUNTER — Other Ambulatory Visit: Payer: Self-pay

## 2019-11-30 ENCOUNTER — Ambulatory Visit: Payer: BC Managed Care – PPO | Admitting: Urology

## 2019-11-30 ENCOUNTER — Encounter: Payer: Self-pay | Admitting: Urology

## 2019-11-30 VITALS — BP 125/78 | HR 98 | Ht 74.0 in | Wt 234.4 lb

## 2019-11-30 DIAGNOSIS — E349 Endocrine disorder, unspecified: Secondary | ICD-10-CM

## 2019-11-30 MED ORDER — TESTOSTERONE CYPIONATE 200 MG/ML IM SOLN
200.0000 mg | Freq: Once | INTRAMUSCULAR | Status: AC
  Administered 2019-11-30: 200 mg via INTRAMUSCULAR

## 2019-11-30 MED ORDER — "BD SAFETYGLIDE SHIELDED NEEDLE 21G X 1-1/2"" MISC": 0 refills | Status: DC

## 2019-11-30 MED ORDER — "SYRINGE/NEEDLE (DISP) 18G X 1-1/2"" 3 ML MISC": 0 refills | Status: DC

## 2019-11-30 NOTE — Progress Notes (Signed)
Testosterone IM Injection  Due to Hypogonadism patient is present today for a Testosterone Injection.  Medication: Testosterone Cypionate Dose: 46ml Location: right upper outer leg Lot: [id number] Exp:05/2021  Patient tolerated well, no complications were noted  Preformed by: Elberta Leatherwood, CMA  Follow up: 1 week for Testosterone lab draw

## 2019-11-30 NOTE — Progress Notes (Signed)
See Carrie's note 

## 2019-12-08 ENCOUNTER — Other Ambulatory Visit: Payer: Self-pay

## 2019-12-08 DIAGNOSIS — E291 Testicular hypofunction: Secondary | ICD-10-CM

## 2019-12-08 DIAGNOSIS — G5603 Carpal tunnel syndrome, bilateral upper limbs: Secondary | ICD-10-CM | POA: Diagnosis not present

## 2019-12-09 ENCOUNTER — Ambulatory Visit (INDEPENDENT_AMBULATORY_CARE_PROVIDER_SITE_OTHER): Payer: BC Managed Care – PPO | Admitting: Family Medicine

## 2019-12-09 ENCOUNTER — Other Ambulatory Visit: Payer: BC Managed Care – PPO

## 2019-12-09 ENCOUNTER — Other Ambulatory Visit: Payer: Self-pay

## 2019-12-09 DIAGNOSIS — E291 Testicular hypofunction: Secondary | ICD-10-CM | POA: Diagnosis not present

## 2019-12-09 NOTE — Progress Notes (Signed)
Patient presents today for a print out for the self Testosterone injection. I printed the instructions.

## 2019-12-09 NOTE — Patient Instructions (Addendum)

## 2019-12-10 LAB — TESTOSTERONE: Testosterone: 370 ng/dL (ref 264–916)

## 2019-12-12 ENCOUNTER — Telehealth: Payer: Self-pay | Admitting: Family Medicine

## 2019-12-12 NOTE — Telephone Encounter (Signed)
-----   Message from Nori Riis, PA-C sent at 12/11/2019  6:45 PM EST ----- Please let Jonathan Fields know that his testosterone level is within the normal limits and continue present dose.

## 2019-12-12 NOTE — Telephone Encounter (Signed)
Patient notified and voiced understanding.

## 2019-12-16 DIAGNOSIS — G5603 Carpal tunnel syndrome, bilateral upper limbs: Secondary | ICD-10-CM | POA: Diagnosis not present

## 2020-01-09 ENCOUNTER — Telehealth: Payer: Self-pay

## 2020-01-09 NOTE — Telephone Encounter (Signed)
Patient called stating that his pharmacy informed him that his testosterone needs a prior auth and was checking on the status

## 2020-01-10 ENCOUNTER — Other Ambulatory Visit: Payer: Self-pay | Admitting: Family Medicine

## 2020-01-10 NOTE — Telephone Encounter (Signed)
Patient called back in regards to medication

## 2020-01-10 NOTE — Telephone Encounter (Signed)
Spoke to patient and informed him that a new RX for Testosterone needs to be sent to pharmacy. Refill request was sent to Dr. Bernardo Heater.

## 2020-01-11 MED ORDER — TESTOSTERONE CYPIONATE 200 MG/ML IM SOLN: 200.0000 mg | INTRAMUSCULAR | 0 refills | Status: DC

## 2020-01-12 ENCOUNTER — Other Ambulatory Visit: Payer: Self-pay | Admitting: Family Medicine

## 2020-01-12 DIAGNOSIS — E291 Testicular hypofunction: Secondary | ICD-10-CM

## 2020-01-12 NOTE — Telephone Encounter (Signed)
I believe he is a former [facility] patient and those records are not available.  Unfortunately if PA requires documented low testosterone levels he will need to stop his testosterone and have levels redrawn.

## 2020-01-12 NOTE — Telephone Encounter (Signed)
Spoke to patient and he will come in for 2 early morning Testosterone draws to finish PA.

## 2020-01-20 ENCOUNTER — Other Ambulatory Visit: Payer: Self-pay

## 2020-01-25 ENCOUNTER — Other Ambulatory Visit: Payer: BC Managed Care – PPO

## 2020-01-25 ENCOUNTER — Other Ambulatory Visit: Payer: Self-pay | Admitting: Urology

## 2020-01-25 ENCOUNTER — Other Ambulatory Visit: Payer: Self-pay

## 2020-01-25 DIAGNOSIS — R8290 Unspecified abnormal findings in urine: Secondary | ICD-10-CM

## 2020-01-25 DIAGNOSIS — E291 Testicular hypofunction: Secondary | ICD-10-CM | POA: Diagnosis not present

## 2020-01-25 LAB — URINALYSIS, COMPLETE
Bilirubin, UA: NEGATIVE
Leukocytes,UA: NEGATIVE
Nitrite, UA: NEGATIVE
RBC, UA: NEGATIVE
Specific Gravity, UA: 1.025 (ref 1.005–1.030)
Urobilinogen, Ur: 1 mg/dL (ref 0.2–1.0)
pH, UA: 5.5 (ref 5.0–7.5)

## 2020-01-25 LAB — MICROSCOPIC EXAMINATION: RBC, Urine: NONE SEEN /hpf (ref 0–2)

## 2020-01-26 ENCOUNTER — Ambulatory Visit: Payer: BC Managed Care – PPO | Admitting: Urology

## 2020-01-26 LAB — TESTOSTERONE: Testosterone: 8 ng/dL — ABNORMAL LOW (ref 264–916)

## 2020-01-27 ENCOUNTER — Other Ambulatory Visit: Payer: Self-pay | Admitting: Family Medicine

## 2020-01-27 DIAGNOSIS — N401 Enlarged prostate with lower urinary tract symptoms: Secondary | ICD-10-CM

## 2020-01-29 LAB — CULTURE, URINE COMPREHENSIVE

## 2020-01-30 ENCOUNTER — Telehealth: Payer: Self-pay | Admitting: Family Medicine

## 2020-01-30 ENCOUNTER — Other Ambulatory Visit: Payer: BC Managed Care – PPO

## 2020-01-30 ENCOUNTER — Ambulatory Visit: Payer: BC Managed Care – PPO | Admitting: Urology

## 2020-01-30 ENCOUNTER — Other Ambulatory Visit: Payer: Self-pay

## 2020-01-30 DIAGNOSIS — N401 Enlarged prostate with lower urinary tract symptoms: Secondary | ICD-10-CM

## 2020-01-30 DIAGNOSIS — E291 Testicular hypofunction: Secondary | ICD-10-CM

## 2020-01-30 NOTE — Telephone Encounter (Signed)
Patient notified and voiced understanding.

## 2020-01-30 NOTE — Telephone Encounter (Signed)
-----   Message from Abbie Sons, MD sent at 01/29/2020 12:42 PM EST ----- His testosterone level was 8.  He had to stop for prior authorization.  Looks like he is scheduled for the second level tomorrow

## 2020-01-31 LAB — TESTOSTERONE: Testosterone: 123 ng/dL — ABNORMAL LOW (ref 264–916)

## 2020-01-31 LAB — PSA: Prostate Specific Ag, Serum: 1.4 ng/mL (ref 0.0–4.0)

## 2020-02-01 ENCOUNTER — Other Ambulatory Visit: Payer: BLUE CROSS/BLUE SHIELD

## 2020-02-03 ENCOUNTER — Ambulatory Visit: Payer: BC Managed Care – PPO | Admitting: Urology

## 2020-02-03 ENCOUNTER — Telehealth: Payer: Self-pay | Admitting: *Deleted

## 2020-02-03 ENCOUNTER — Encounter: Payer: Self-pay | Admitting: Urology

## 2020-02-03 ENCOUNTER — Other Ambulatory Visit: Payer: Self-pay

## 2020-02-03 VITALS — BP 192/48 | HR 93 | Ht 74.0 in | Wt 235.0 lb

## 2020-02-03 DIAGNOSIS — E291 Testicular hypofunction: Secondary | ICD-10-CM | POA: Diagnosis not present

## 2020-02-03 DIAGNOSIS — N401 Enlarged prostate with lower urinary tract symptoms: Secondary | ICD-10-CM

## 2020-02-03 MED ORDER — TESTOSTERONE CYPIONATE 200 MG/ML IM SOLN: 100.0000 mg | INTRAMUSCULAR | 0 refills | Status: DC

## 2020-02-03 NOTE — Telephone Encounter (Signed)
Testosterone approved 01/31/2020-11/30/2038 [id number]

## 2020-02-03 NOTE — Progress Notes (Signed)
02/03/2020 9:07 AM   Jonathan Fields [birth year] [id number]  Referring provider: Karen Kitchens, MD [place],  [place] [address]  Chief Complaint  Patient presents with  . Follow-up    Urologic history: 1.BPH with lower urinary tract symptoms -Prior medical management -UDS consistent with obstruction. -TURP by Dr. Pilar Jarvis 01/2018; 7 g, benign   2.Hypogonadism  -On TRT  HPI: 64 yo male presents for follow-up.  -Recently insurance company required prior authorization for testosterone no previous abnormal levels were available and he had to stop replacement -Levels off TRT were 8 and 123; insurance approved -Significant decrease in energy after stopping -No bothersome lower urinary tract symptoms -Still on tamsulosin after TURP   PMH: Past Medical History:  Diagnosis Date  . BPH (benign prostatic hyperplasia)   . Bronchitis   . Complication of anesthesia    hard to wake up  . DM (diabetes mellitus), type 2 ([place])   . DVT (deep venous thrombosis) ([place])    post op  . ED (erectile dysfunction)   . GERD (gastroesophageal reflux disease)   . Hyperlipemia   . Hypertension   . Hypogonadism in male   . Nocturia   . Right ankle instability   . Sleep apnea   . TB lung, latent   . Urinary frequency     Surgical History: Past Surgical History:  Procedure Laterality Date  . ANKLE SURGERY Left    torn ligament  . CARPAL TUNNEL RELEASE Right 08/2019  . COLONOSCOPY  2009  . KNEE ARTHROSCOPY Left   . right ankle ruptured tendon surgery  2008  . TRANSURETHRAL RESECTION OF PROSTATE N/A 02/19/2018   Procedure: TRANSURETHRAL RESECTION OF THE PROSTATE (TURP);  Surgeon: [name], MD;  Location: [facility] ORS;  Service: Urology;  Laterality: N/A;    Home Medications:  Allergies as of 02/03/2020      Reactions   Penicillins Hives   Has patient had a PCN reaction causing immediate rash,  facial/tongue/throat swelling, SOB or lightheadedness with hypotension: yes Has patient had a PCN reaction causing severe rash involving mucus membranes or skin necrosis: no Has patient had a PCN reaction that required hospitalization: no Has patient had a PCN reaction occurring within the last 10 years: no If all of the above answers are "NO", then may proceed with Cephalosporin use.      Medication List       Accurate as of February 03, 2020  9:07 AM. If you have any questions, ask your nurse or doctor.        atorvastatin 20 MG tablet Commonly known as: LIPITOR Take 20 mg by mouth daily.   BD SafetyGlide Shielded Needle 21G X 1-1/2" Misc Generic drug: NEEDLE (DISP) 21 G Use 21guage needle to inject Testosterone Cypionate 33ml.   finasteride 5 MG tablet Commonly known as: PROSCAR Take 5 mg by mouth daily.   fluticasone 50 MCG/ACT nasal spray Commonly known as: FLONASE instill 1 to 2 sprays into each nostril once daily   glipiZIDE 5 MG 24 hr tablet Commonly known as: GLUCOTROL XL TK 1 T PO QD   lisinopril 40 MG tablet Commonly known as: ZESTRIL Take one tablet daily   metFORMIN 500 MG 24 hr tablet Commonly known as: GLUCOPHAGE-XR TK 4 TS PO QAM   montelukast 10 MG tablet Commonly known as: SINGULAIR Take 10 mg by mouth daily as needed.   omeprazole 20 MG capsule Commonly known as: PRILOSEC Take 20 mg by mouth daily.  SYRINGE-NEEDLE (DISP) 3 ML 18G X 1-1/2" 3 ML Misc Draw up 70ml Testosterone Cypionate with 18 gauge needle.   tamsulosin 0.4 MG Caps capsule Commonly known as: FLOMAX Take 1 capsule (0.4 mg total) by mouth daily.   testosterone cypionate 200 MG/ML injection Commonly known as: DEPOTESTOSTERONE CYPIONATE Inject 1 mL (200 mg total) into the muscle every 14 (fourteen) days.       Allergies:  Allergies  Allergen Reactions  . Penicillins Hives    Has patient had a PCN reaction causing immediate rash, facial/tongue/throat swelling, SOB or  lightheadedness with hypotension: yes Has patient had a PCN reaction causing severe rash involving mucus membranes or skin necrosis: no Has patient had a PCN reaction that required hospitalization: no Has patient had a PCN reaction occurring within the last 10 years: no If all of the above answers are "NO", then may proceed with Cephalosporin use.     Family History: Family History  Problem Relation Age of Onset  . Heart disease Father   . Hypertension Father   . Prostate cancer Father   . Heart disease Mother   . Breast cancer Mother   . Colon cancer Neg Hx     Social History:  reports that he has never smoked. His smokeless tobacco use includes snuff. He reports current alcohol use. He reports that he does not use drugs.   Physical Exam: BP (!) 192/48   Pulse 93   Ht 6\' 2"  (1.88 m)   Wt 235 lb (106.6 kg)   BMI 30.17 kg/m   Constitutional:  Alert and oriented, No acute distress. HEENT: [place] AT, moist mucus membranes.  Trachea midline, no masses. Cardiovascular: No clubbing, cyanosis, or edema. Respiratory: Normal respiratory effort, no increased work of breathing. GI: Abdomen is soft, nontender, nondistended, no abdominal masses GU: Prostate 40 g, smooth without nodules Skin: No rashes, bruises or suspicious lesions. Neurologic: Grossly intact, no focal deficits, moving all 4 extremities. Psychiatric: Normal mood and affect.   Assessment & Plan:    - Hypogonadism He requested to go to weekly injections and will change to 100 mg weekly.  Recheck testosterone level with a hematocrit in 6 weeks.  If stable lab visit 6 months for testosterone/hematocrit and office visit 1 year.  PSA was stable at 1.4  - BPH with lower urinary tract symptoms Status post TURP 2017.  He had remained on finasteride and tamsulosin.  Finasteride was stopped in October.  We will have him stop the tamsulosin and he may restart for any worsening voiding symptoms.   Abbie Sons, [name] [address], [place], [place] [address] [phone]

## 2020-02-14 DIAGNOSIS — G5603 Carpal tunnel syndrome, bilateral upper limbs: Secondary | ICD-10-CM | POA: Diagnosis not present

## 2020-02-26 DIAGNOSIS — Z20822 Contact with and (suspected) exposure to covid-19: Secondary | ICD-10-CM | POA: Diagnosis not present

## 2020-02-29 DIAGNOSIS — M19031 Primary osteoarthritis, right wrist: Secondary | ICD-10-CM | POA: Diagnosis not present

## 2020-02-29 DIAGNOSIS — G5603 Carpal tunnel syndrome, bilateral upper limbs: Secondary | ICD-10-CM | POA: Diagnosis not present

## 2020-03-07 DIAGNOSIS — I10 Essential (primary) hypertension: Secondary | ICD-10-CM | POA: Diagnosis not present

## 2020-03-07 DIAGNOSIS — Z6830 Body mass index (BMI) 30.0-30.9, adult: Secondary | ICD-10-CM | POA: Diagnosis not present

## 2020-03-07 DIAGNOSIS — G5603 Carpal tunnel syndrome, bilateral upper limbs: Secondary | ICD-10-CM | POA: Diagnosis not present

## 2020-03-12 ENCOUNTER — Other Ambulatory Visit: Payer: Self-pay | Admitting: *Deleted

## 2020-03-12 DIAGNOSIS — N401 Enlarged prostate with lower urinary tract symptoms: Secondary | ICD-10-CM

## 2020-03-12 DIAGNOSIS — E291 Testicular hypofunction: Secondary | ICD-10-CM

## 2020-03-14 ENCOUNTER — Other Ambulatory Visit: Payer: Self-pay

## 2020-03-14 ENCOUNTER — Telehealth: Payer: Self-pay | Admitting: Family Medicine

## 2020-03-14 ENCOUNTER — Other Ambulatory Visit: Payer: BC Managed Care – PPO

## 2020-03-14 DIAGNOSIS — E291 Testicular hypofunction: Secondary | ICD-10-CM | POA: Diagnosis not present

## 2020-03-14 DIAGNOSIS — D751 Secondary polycythemia: Secondary | ICD-10-CM

## 2020-03-14 NOTE — Telephone Encounter (Signed)
Patient came in today for Testosterone blood work and he asked to speak to me. He states he is out of testosterone and is unable to get it refilled. I called [facility] to find out what the problem is. She states he was given a 3 month supply. He picked up the medication on 02/05/20. He is suppose to be using .7ml each week but he was using 32ml each week. He has been out of the medication for 2 weeks so the lab today is not from a weekly injection. He cannot refill this medication until May.

## 2020-03-15 LAB — TESTOSTERONE: Testosterone: 156 ng/dL — ABNORMAL LOW (ref 264–916)

## 2020-03-15 LAB — HEMATOCRIT: Hematocrit: 55.1 % — ABNORMAL HIGH (ref 37.5–51.0)

## 2020-03-19 NOTE — Telephone Encounter (Signed)
He was previously injecting 200 mg every 2 weeks and at the March visit wanted to go to weekly injections however I informed him he would need to decrease the dose to 100 mg (0.5 mL) weekly.  His recent blood work shows an elevated hematocrit at 55.  He is at risk for stroke with a RBC this elevated.  I cannot refill the testosterone until this is lower.  I put in a hematology consult for further evaluation.  Once testosterone is restarted he needs to inject 0.5 cc weekly.  We can get a follow-up testosterone level 4-6 weeks after he resumes injections and adjust the dose if we need to.

## 2020-03-20 NOTE — Telephone Encounter (Signed)
Patient notified and voiced understanding. He will resume 0.40ml when given the clearance from hematology.

## 2020-03-22 ENCOUNTER — Other Ambulatory Visit: Payer: BLUE CROSS/BLUE SHIELD

## 2020-03-27 ENCOUNTER — Encounter: Payer: Self-pay | Admitting: Oncology

## 2020-03-27 NOTE — Progress Notes (Signed)
Patient contacted for new pt visit. No concerns voiced.

## 2020-03-28 ENCOUNTER — Inpatient Hospital Stay: Payer: BC Managed Care – PPO | Attending: Oncology | Admitting: Oncology

## 2020-03-28 ENCOUNTER — Inpatient Hospital Stay: Payer: BC Managed Care – PPO

## 2020-03-28 ENCOUNTER — Other Ambulatory Visit: Payer: Self-pay

## 2020-03-28 VITALS — BP 153/85 | HR 98 | Temp 98.5°F | Resp 18 | Ht 74.0 in | Wt 231.5 lb

## 2020-03-28 DIAGNOSIS — E119 Type 2 diabetes mellitus without complications: Secondary | ICD-10-CM | POA: Diagnosis not present

## 2020-03-28 DIAGNOSIS — K219 Gastro-esophageal reflux disease without esophagitis: Secondary | ICD-10-CM | POA: Diagnosis not present

## 2020-03-28 DIAGNOSIS — Z79890 Hormone replacement therapy (postmenopausal): Secondary | ICD-10-CM | POA: Diagnosis not present

## 2020-03-28 DIAGNOSIS — E785 Hyperlipidemia, unspecified: Secondary | ICD-10-CM | POA: Diagnosis not present

## 2020-03-28 DIAGNOSIS — Z79899 Other long term (current) drug therapy: Secondary | ICD-10-CM | POA: Insufficient documentation

## 2020-03-28 DIAGNOSIS — D751 Secondary polycythemia: Secondary | ICD-10-CM

## 2020-03-28 DIAGNOSIS — N400 Enlarged prostate without lower urinary tract symptoms: Secondary | ICD-10-CM | POA: Diagnosis not present

## 2020-03-28 DIAGNOSIS — I10 Essential (primary) hypertension: Secondary | ICD-10-CM | POA: Insufficient documentation

## 2020-03-28 DIAGNOSIS — Z7984 Long term (current) use of oral hypoglycemic drugs: Secondary | ICD-10-CM | POA: Insufficient documentation

## 2020-03-28 DIAGNOSIS — G4730 Sleep apnea, unspecified: Secondary | ICD-10-CM | POA: Insufficient documentation

## 2020-03-28 LAB — CBC WITH DIFFERENTIAL/PLATELET
Abs Immature Granulocytes: 0.12 10*3/uL — ABNORMAL HIGH (ref 0.00–0.07)
Basophils Absolute: 0.1 10*3/uL (ref 0.0–0.1)
Basophils Relative: 0 %
Eosinophils Absolute: 0.1 10*3/uL (ref 0.0–0.5)
Eosinophils Relative: 1 %
HCT: 47.3 % (ref 39.0–52.0)
Hemoglobin: 16.6 g/dL (ref 13.0–17.0)
Immature Granulocytes: 1 %
Lymphocytes Relative: 11 %
Lymphs Abs: 1.5 10*3/uL (ref 0.7–4.0)
MCH: 28.8 pg (ref 26.0–34.0)
MCHC: 35.1 g/dL (ref 30.0–36.0)
MCV: 82 fL (ref 80.0–100.0)
Monocytes Absolute: 1.3 10*3/uL — ABNORMAL HIGH (ref 0.1–1.0)
Monocytes Relative: 9 %
Neutro Abs: 10.2 10*3/uL — ABNORMAL HIGH (ref 1.7–7.7)
Neutrophils Relative %: 78 %
Platelets: 218 10*3/uL (ref 150–400)
RBC: 5.77 MIL/uL (ref 4.22–5.81)
RDW: 12.1 % (ref 11.5–15.5)
WBC: 13.3 10*3/uL — ABNORMAL HIGH (ref 4.0–10.5)
nRBC: 0 % (ref 0.0–0.2)

## 2020-03-28 LAB — COMPREHENSIVE METABOLIC PANEL
ALT: 53 U/L — ABNORMAL HIGH (ref 0–44)
AST: 23 U/L (ref 15–41)
Albumin: 3.6 g/dL (ref 3.5–5.0)
Alkaline Phosphatase: 68 U/L (ref 38–126)
Anion gap: 11 (ref 5–15)
BUN: 19 mg/dL (ref 8–23)
CO2: 25 mmol/L (ref 22–32)
Calcium: 8.9 mg/dL (ref 8.9–10.3)
Chloride: 98 mmol/L (ref 98–111)
Creatinine, Ser: 1.19 mg/dL (ref 0.61–1.24)
GFR calc Af Amer: >60 mL/min (ref >60)
GFR calc non Af Amer: >60 mL/min (ref >60)
Glucose, Bld: 429 mg/dL — ABNORMAL HIGH (ref 70–99)
Potassium: 4.6 mmol/L (ref 3.5–5.1)
Sodium: 134 mmol/L — ABNORMAL LOW (ref 135–145)
Total Bilirubin: 1.2 mg/dL (ref 0.3–1.2)
Total Protein: 7.1 g/dL (ref 6.5–8.1)

## 2020-03-28 NOTE — Progress Notes (Signed)
Established patient visit   Patient: Jonathan Fields   DOB: 04-Oct-1956   64 y.o. Male  MRN: [id number] Visit Date: 03/29/2020  Today's healthcare provider: Trinna Post, PA-C   Chief Complaint  Patient presents with  . Sinusitis   Subjective    Sinus Problem This is a new problem. Episode onset: 2 weeks ago. The problem is unchanged. There has been no fever. Associated symptoms include congestion and sinus pressure. Pertinent negatives include no coughing, headaches or sore throat. Treatments tried: Zyrtec. The treatment provided mild relief.  Reports using flonase every day for 8 years for chronic allergies. Denies one side worse than the other in terms of nasal congestion. He has not had COVID, vaccinated one dose of COVID and second dose is next week. Previously prescribed singulair, last taken one month.   Patient reports swelling in his rectal area which he describes as a "hematoma" and reports it is uncomfortable for him to sit. He has a history of hemorrhoids. He reports this has been going on for four days. Not constipated and not having diarrhea.      Medications: Outpatient Medications Prior to Visit  Medication Sig  . atorvastatin (LIPITOR) 20 MG tablet Take 20 mg by mouth daily.  . finasteride (PROSCAR) 5 MG tablet Take 5 mg by mouth daily.  . fluticasone (FLONASE) 50 MCG/ACT nasal spray instill 1 to 2 sprays into each nostril once daily  . glipiZIDE (GLUCOTROL XL) 5 MG 24 hr tablet TK 1 T PO QD  . lisinopril (ZESTRIL) 40 MG tablet Take one tablet daily  . metFORMIN (GLUCOPHAGE-XR) 500 MG 24 hr tablet TK 4 TS PO QAM  . montelukast (SINGULAIR) 10 MG tablet Take 10 mg by mouth daily as needed.   Marland Kitchen NEEDLE, DISP, 21 G (BD SAFETYGLIDE SHIELDED NEEDLE) 21G X 1-1/2" MISC Use 21guage needle to inject Testosterone Cypionate 78ml.  Marland Kitchen omeprazole (PRILOSEC) 20 MG capsule Take 20 mg by mouth daily.  . SYRINGE-NEEDLE, DISP, 3 ML 18G X 1-1/2" 3 ML MISC Draw up 24ml  Testosterone Cypionate with 18 gauge needle.  . tamsulosin (FLOMAX) 0.4 MG CAPS capsule Take 0.4 mg by mouth daily.  Marland Kitchen testosterone cypionate (DEPOTESTOSTERONE CYPIONATE) 200 MG/ML injection Inject 0.5 mLs (100 mg total) into the muscle once a week.   No facility-administered medications prior to visit.    Review of Systems  Constitutional: Negative.   HENT: Positive for congestion and sinus pressure. Negative for sore throat.   Respiratory: Negative for cough.   Cardiovascular: Negative.   Musculoskeletal: Negative.   Neurological: Negative for headaches.      Objective    There were no vitals taken for this visit.   Physical Exam    No results found for any visits on 03/29/20.  Assessment & Plan    1. Nasal congestion  Worsening allergies, symptoms not consistent with sinusitis. Advised to restart singulair and will change flonase to dymista.  - Azelastine-Fluticasone 137-50 MCG/ACT SUSP; One spray each nostril twice daily.  Dispense: 23 g; Refill: 1 - montelukast (SINGULAIR) 10 MG tablet; Take 1 tablet (10 mg total) by mouth daily as needed.  Dispense: 90 tablet; Refill: 1  2. Rectal pain  Possibly external hemorrhoid or thrombosed hemorrhoid. Will treat as below. He has an in person evaluation on 04/09/2020.  - hydrocortisone (ANUSOL-HC) 2.5 % rectal cream; Place 1 application rectally 2 (two) times daily.  Dispense: 30 g; Refill: 0        I,  Trinna Post, PA-C, have reviewed all documentation for this visit. The documentation on 03/29/20 for the exam, diagnosis, procedures, and orders are all accurate and complete.    Paulene Floor  [facility] [phone] (phone) [phone] (fax)  [place]

## 2020-03-28 NOTE — Progress Notes (Signed)
Hematology/Oncology Consult note [facility] [phone] [phone]   Patient Care Team: Virginia Crews, MD as PCP - General (Family Medicine)  REFERRING PROVIDER: Abbie Sons, MD  CHIEF COMPLAINTS/REASON FOR VISIT:  Evaluation of polycytosis/erythrocytosis  HISTORY OF PRESENTING ILLNESS:  Jonathan Fields is a 64 y.o. male who was seen in consultation at the request of Stoioff, Ronda Fairly, MD for evaluation of polycytosis/erythrocytosis Reviewed patient's recent lab work 03/14/2020, hematocrit 55, 09/13/2019 hematocrit 51.8, 07/22/2019, hematocrit 49.1.  Hemoglobin 17.2.   Patient is on testosterone replacement.  Currently Dr. Bernardo Heater hold off treatments due to erythrocytosis. Patient was is Dr. Dennis Bast to hematology oncology for evaluation and management. Associated signs or symptoms: Patient mention about 15 pounds of unintentional weight loss recently during the past few weeks.  His appetite is unchanged.  He feels that he has eaten similar amount of food recently.  Denies any increase of activity level.  Some sweating for the past 2 days.  Context:  Smoking history: Denies. Testosterone supplements: History of blood clots: Previously he has an episode of provoked thrombosis after surgery. Daytime somnolence: Denies Family history of polycythemia: Denies    Review of Systems  Constitutional: Positive for unexpected weight change. Negative for appetite change, chills, fatigue and fever.  HENT:   Negative for hearing loss and voice change.   Eyes: Negative for eye problems and icterus.  Respiratory: Negative for chest tightness, cough and shortness of breath.   Cardiovascular: Negative for chest pain and leg swelling.  Gastrointestinal: Negative for abdominal distention and abdominal pain.  Endocrine: Negative for hot flashes.  Genitourinary: Negative for difficulty urinating, dysuria and frequency.   Musculoskeletal: Negative  for arthralgias.  Skin: Negative for itching and rash.  Neurological: Negative for light-headedness and numbness.  Hematological: Negative for adenopathy. Does not bruise/bleed easily.  Psychiatric/Behavioral: Negative for confusion.    MEDICAL HISTORY:  Past Medical History:  Diagnosis Date  . BPH (benign prostatic hyperplasia)   . Bronchitis   . Complication of anesthesia    hard to wake up  . DM (diabetes mellitus), type 2 ([place])   . DVT (deep venous thrombosis) ([place])    post op  . ED (erectile dysfunction)   . GERD (gastroesophageal reflux disease)   . Hyperlipemia   . Hypertension   . Hypogonadism in male   . Nocturia   . Right ankle instability   . Sleep apnea   . TB lung, latent   . Urinary frequency     SURGICAL HISTORY: Past Surgical History:  Procedure Laterality Date  . ANKLE SURGERY Left    torn ligament  . CARPAL TUNNEL RELEASE Right 08/2019  . COLONOSCOPY  2009  . KNEE ARTHROSCOPY Left   . right ankle ruptured tendon surgery  2008  . TRANSURETHRAL RESECTION OF PROSTATE N/A 02/19/2018   Procedure: TRANSURETHRAL RESECTION OF THE PROSTATE (TURP);  Surgeon: [name], MD;  Location: [facility] ORS;  Service: Urology;  Laterality: N/A;    SOCIAL HISTORY: Social History   Socioeconomic History  . Marital status: Married    Spouse name: Not on file  . Number of children: 0  . Years of education: Not on file  . Highest education level: Not on file  Occupational History  . Not on file  Tobacco Use  . Smoking status: Never Smoker  . Smokeless tobacco: Current User    Types: Snuff  . Tobacco comment: tobaco pouches that produce juice  Substance and Sexual Activity  .  Alcohol use: Yes    Comment: socially, nothing every week  . Drug use: No  . Sexual activity: Yes    Partners: Female    Birth control/protection: None  Other Topics Concern  . Not on file  Social History Narrative  . Not on file   Social Determinants of Health   Financial  Resource Strain:   . Difficulty of Paying Living Expenses:   Food Insecurity:   . Worried About Charity fundraiser in the Last Year:   . Arboriculturist in the Last Year:   Transportation Needs:   . Film/video editor (Medical):   Marland Kitchen Lack of Transportation (Non-Medical):   Physical Activity:   . Days of Exercise per Week:   . Minutes of Exercise per Session:   Stress:   . Feeling of Stress :   Social Connections:   . Frequency of Communication with Friends and Family:   . Frequency of Social Gatherings with Friends and Family:   . Attends Religious Services:   . Active Member of Clubs or Organizations:   . Attends Archivist Meetings:   Marland Kitchen Marital Status:   Intimate Partner Violence:   . Fear of Current or Ex-Partner:   . Emotionally Abused:   Marland Kitchen Physically Abused:   . Sexually Abused:     FAMILY HISTORY: Family History  Problem Relation Age of Onset  . Heart disease Father   . Hypertension Father   . Prostate cancer Father   . Heart disease Mother   . Breast cancer Mother   . Colon cancer Neg Hx     ALLERGIES:  is allergic to penicillins.  MEDICATIONS:  Current Outpatient Medications  Medication Sig Dispense Refill  . atorvastatin (LIPITOR) 20 MG tablet Take 20 mg by mouth daily.    . finasteride (PROSCAR) 5 MG tablet Take 5 mg by mouth daily.    . fluticasone (FLONASE) 50 MCG/ACT nasal spray instill 1 to 2 sprays into each nostril once daily  0  . glipiZIDE (GLUCOTROL XL) 5 MG 24 hr tablet TK 1 T PO QD    . lisinopril (ZESTRIL) 40 MG tablet Take one tablet daily 90 tablet 3  . metFORMIN (GLUCOPHAGE-XR) 500 MG 24 hr tablet TK 4 TS PO QAM  2  . montelukast (SINGULAIR) 10 MG tablet Take 10 mg by mouth daily as needed.     Marland Kitchen NEEDLE, DISP, 21 G (BD SAFETYGLIDE SHIELDED NEEDLE) 21G X 1-1/2" MISC Use 21guage needle to inject Testosterone Cypionate 33ml. 25 each 0  . omeprazole (PRILOSEC) 20 MG capsule Take 20 mg by mouth daily.    . SYRINGE-NEEDLE, DISP, 3 ML  18G X 1-1/2" 3 ML MISC Draw up 18ml Testosterone Cypionate with 18 gauge needle. 25 each 0  . testosterone cypionate (DEPOTESTOSTERONE CYPIONATE) 200 MG/ML injection Inject 0.5 mLs (100 mg total) into the muscle once a week. 10 mL 0   No current facility-administered medications for this visit.     PHYSICAL EXAMINATION: ECOG PERFORMANCE STATUS: 0 - Asymptomatic Vitals:   03/27/20 1629  BP: (!) 153/85  Pulse: 98  Resp: 18  Temp: 98.5 F (36.9 C)  SpO2: 98%   Filed Weights   03/27/20 1629  Weight: 231 lb 8 oz (105 kg)    Physical Exam Constitutional:      General: He is not in acute distress. HENT:     Head: Normocephalic and atraumatic.  Eyes:     General: No scleral icterus. Cardiovascular:  Rate and Rhythm: Normal rate and regular rhythm.     Heart sounds: Normal heart sounds.  Pulmonary:     Effort: Pulmonary effort is normal. No respiratory distress.     Breath sounds: No wheezing.  Abdominal:     General: Bowel sounds are normal. There is no distension.     Palpations: Abdomen is soft.  Musculoskeletal:        General: No deformity. Normal range of motion.     Cervical back: Normal range of motion and neck supple.  Skin:    General: Skin is warm and dry.     Findings: No erythema or rash.  Neurological:     Mental Status: He is alert and oriented to person, place, and time. Mental status is at baseline.     Cranial Nerves: No cranial nerve deficit.     Coordination: Coordination normal.  Psychiatric:        Mood and Affect: Mood normal.     RADIOGRAPHIC STUDIES: I have personally reviewed the radiological images as listed and agreed with the findings in the report. No results found.   LABORATORY DATA:  I have reviewed the data as listed Lab Results  Component Value Date   WBC 13.3 (H) 03/28/2020   HGB 16.6 03/28/2020   HCT 47.3 03/28/2020   MCV 82.0 03/28/2020   PLT 218 03/28/2020   Recent Labs    03/28/20 1050  NA 134*  K 4.6  CL 98    CO2 25  GLUCOSE 429*  BUN 19  CREATININE 1.19  CALCIUM 8.9  GFRNONAA >60  GFRAA >60  PROT 7.1  ALBUMIN 3.6  AST 23  ALT 53*  ALKPHOS 68  BILITOT 1.2   Iron/TIBC/Ferritin/ %Sat No results found for: IRON, TIBC, FERRITIN, IRONPCTSAT      ASSESSMENT & PLAN:  1. Erythrocytosis   2. Long-term current use of testosterone replacement therapy    Labs are reviewed and discussed with patient. Polycythemia (erythrocytosis) is an abnormal elevation of hemoglobin (Hgb) and/or hematocrit (Hct) in peripheral blood, and this can be caused by primary etiology, ie bone marrow mutation, or secondary etiology, ie hypoxia, smoking, androgen supplements, etc.  In his case, most likely his erythrocytosis secondary to testosterone use.  Rule out other etiologies. He does provide history of I will obtain erythropoietin,  JAK2 with reflex to other mutations, check CBC, CMP  -Labs reviewed.  Hemoglobin has decreased to 16.6, hematocrit 47.3.  I'll hold off phlebotomy. We'll further discuss with Dr. Bernardo Heater.  Patient may be restarted on testosterone replacement therapy with close monitoring of hemoglobin hematocrit.  If needed, patient will receive therapeutic phlebotomy.   Orders Placed This Encounter  Procedures  . CBC with Differential/Platelet    Standing Status:   Future    Number of Occurrences:   1    Standing Expiration Date:   03/28/2021  . Comprehensive metabolic panel    Standing Status:   Future    Number of Occurrences:   1    Standing Expiration Date:   03/28/2021  . JAK2 V617F, w Reflex to CALR/E12/MPL    Standing Status:   Future    Number of Occurrences:   1    Standing Expiration Date:   03/28/2021  . Erythropoietin    Standing Status:   Future    Number of Occurrences:   1    Standing Expiration Date:   03/28/2021    We spent sufficient time to discuss many aspect of care,  questions were answered to patient's satisfaction. The patient knows to call the clinic with any  problems questions or concerns.  Cc Stoioff, Ronda Fairly, MD  Return of visit: 2 weeks Thank you for this kind referral and the opportunity to participate in the care of this patient. A copy of today's note is routed to referring provider    Earlie Server, MD, PhD 03/28/2020

## 2020-03-29 ENCOUNTER — Encounter: Payer: Self-pay | Admitting: Physician Assistant

## 2020-03-29 ENCOUNTER — Ambulatory Visit (INDEPENDENT_AMBULATORY_CARE_PROVIDER_SITE_OTHER): Payer: BC Managed Care – PPO | Admitting: Physician Assistant

## 2020-03-29 DIAGNOSIS — R0981 Nasal congestion: Secondary | ICD-10-CM

## 2020-03-29 DIAGNOSIS — K6289 Other specified diseases of anus and rectum: Secondary | ICD-10-CM | POA: Diagnosis not present

## 2020-03-29 LAB — ERYTHROPOIETIN: Erythropoietin: 8.2 m[IU]/mL (ref 2.6–18.5)

## 2020-03-29 MED ORDER — AZELASTINE-FLUTICASONE 137-50 MCG/ACT NA SUSP: NASAL | 1 refills | Status: DC

## 2020-03-29 MED ORDER — MONTELUKAST SODIUM 10 MG PO TABS: 10.0000 mg | ORAL_TABLET | Freq: Every day | ORAL | 1 refills | Status: DC | PRN

## 2020-03-29 MED ORDER — HYDROCORTISONE (PERIANAL) 2.5 % EX CREA: 1.0000 "application " | TOPICAL_CREAM | Freq: Two times a day (BID) | CUTANEOUS | 0 refills | Status: DC

## 2020-03-30 ENCOUNTER — Inpatient Hospital Stay: Payer: BC Managed Care – PPO

## 2020-04-02 ENCOUNTER — Ambulatory Visit: Payer: Self-pay | Admitting: Family Medicine

## 2020-04-06 NOTE — Patient Instructions (Signed)

## 2020-04-06 NOTE — Progress Notes (Signed)
See note from Chipper Herb, medical student, attested by me for this date of service.

## 2020-04-09 ENCOUNTER — Ambulatory Visit: Payer: BC Managed Care – PPO | Admitting: Family Medicine

## 2020-04-09 ENCOUNTER — Telehealth: Payer: Self-pay | Admitting: *Deleted

## 2020-04-09 ENCOUNTER — Encounter: Payer: Self-pay | Admitting: Family Medicine

## 2020-04-09 ENCOUNTER — Other Ambulatory Visit: Payer: Self-pay

## 2020-04-09 VITALS — BP 137/81 | HR 108 | Temp 97.1°F | Wt 228.2 lb

## 2020-04-09 DIAGNOSIS — E785 Hyperlipidemia, unspecified: Secondary | ICD-10-CM

## 2020-04-09 DIAGNOSIS — Z114 Encounter for screening for human immunodeficiency virus [HIV]: Secondary | ICD-10-CM

## 2020-04-09 DIAGNOSIS — I10 Essential (primary) hypertension: Secondary | ICD-10-CM | POA: Diagnosis not present

## 2020-04-09 DIAGNOSIS — E669 Obesity, unspecified: Secondary | ICD-10-CM | POA: Diagnosis not present

## 2020-04-09 DIAGNOSIS — Z23 Encounter for immunization: Secondary | ICD-10-CM

## 2020-04-09 DIAGNOSIS — R634 Abnormal weight loss: Secondary | ICD-10-CM

## 2020-04-09 DIAGNOSIS — Z1159 Encounter for screening for other viral diseases: Secondary | ICD-10-CM

## 2020-04-09 DIAGNOSIS — R29818 Other symptoms and signs involving the nervous system: Secondary | ICD-10-CM

## 2020-04-09 DIAGNOSIS — Z6830 Body mass index (BMI) 30.0-30.9, adult: Secondary | ICD-10-CM

## 2020-04-09 DIAGNOSIS — K644 Residual hemorrhoidal skin tags: Secondary | ICD-10-CM

## 2020-04-09 DIAGNOSIS — E1169 Type 2 diabetes mellitus with other specified complication: Secondary | ICD-10-CM | POA: Diagnosis not present

## 2020-04-09 DIAGNOSIS — E66811 Obesity, class 1: Secondary | ICD-10-CM

## 2020-04-09 LAB — POCT GLYCOSYLATED HEMOGLOBIN (HGB A1C)
Est. average glucose Bld gHb Est-mCnc: 243
Hemoglobin A1C: 10.1 % — AB (ref 4.0–5.6)

## 2020-04-09 MED ORDER — EMPAGLIFLOZIN 10 MG PO TABS
10.0000 mg | ORAL_TABLET | Freq: Every day | ORAL | 3 refills | Status: DC
Start: 2020-04-09 — End: 2020-09-05

## 2020-04-09 NOTE — Assessment & Plan Note (Addendum)
Pt with history of hyperlipidemia associated with type 2 diabetes Condition is uncontrolled and pt is not currently taking medication Last LDL was 111 in March 2020 Pt was prescribed atorvastatin but elects not to take medication   Plan: Discussed and educated pt on the long term benefits of taking statin for reducing risk of CVD Pt agrees to restart atorvastatin Will check lipid panel

## 2020-04-09 NOTE — Telephone Encounter (Signed)
Okay to start back.  Will need a testosterone level and hematocrit in 3 months.  (Lab visit only)

## 2020-04-09 NOTE — Assessment & Plan Note (Signed)
Pt with history of HTN well controlled on medication Pt is taking lisinopril BP today is 137/81  Plan: Will check lipid panel Will check comprehensive metabolic panel Will continue taking current medication as prescribed Discussed the importance of diet and exercise for hypertension control

## 2020-04-09 NOTE — Progress Notes (Addendum)
Established patient visit   Patient: Jonathan Fields   DOB: [birth year]   64 y.o. Male  MRN: [id number] Visit Date: 04/09/2020  Today's healthcare provider: Lavon Paganini, MD   Chief Complaint  Patient presents with  . Diabetes  . Hypertension   Subjective    HPI  Hypertension, follow-up:  BP Readings from Last 3 Encounters:  04/09/20 137/81  03/27/20 (!) 153/85  02/03/20 (!) 192/48    He was last seen for hypertension 7 months ago.  BP at that visit was lisinopril. Management changes since that visit include continuing on current medcations. He reports good compliance with treatment. He is not having side effects.  He is exercising. He is adherent to low salt diet.   Outside blood pressures are not checked. He is experiencing none.  Patient denies chest pain, chest pressure/discomfort, claudication, dyspnea, exertional chest pressure/discomfort, irregular heart beat, lower extremity edema, near-syncope, palpitations, syncope and tachypnea.   Cardiovascular risk factors include advanced age (older than 54 for men, 67 for women), diabetes mellitus, dyslipidemia, hypertension, male gender and obesity (BMI >= 30 kg/m2).  Use of agents associated with hypertension: none.     Weight trend: decreasing steadily Wt Readings from Last 3 Encounters:  04/09/20 228 lb 3.2 oz (103.5 kg)  03/27/20 231 lb 8 oz (105 kg)  02/03/20 235 lb (106.6 kg)    Current diet: in general, a "healthy" diet    ------------------------------------------------------------------------   Diabetes Mellitus Type II, Follow-up:   Lab Results  Component Value Date   HGBA1C 10.1 (A) 04/09/2020   HGBA1C 7.3 (H) 02/09/2019   HGBA1C 7.0 11/26/2018    Last seen for diabetes 7 months ago.  Management since then includes metformin. Pt was prescribed glipizide and is not currently taking medication. He reports poor compliance with treatment. He is not having side effects.  Current  symptoms include weight loss and have been stable. Home blood sugar records are not checked  Episodes of hypoglycemia? no   Current insulin regiment: Is not on insulin Most Recent Eye Exam was not in the recent past. Pt is agreeable to see an ophtalmologist Weight trend: decreasing steadily Prior visit with dietician: No Current exercise: cardiovascular workout on exercise equipment Current diet habits: in general, a "healthy" diet    Pertinent Labs:    Component Value Date/Time   CHOL 188 02/25/2019 0000   TRIG 218 (A) 02/25/2019 0000   HDL 33 (A) 02/25/2019 0000   LDLCALC 111 02/25/2019 0000   CREATININE 1.19 03/28/2020 1050    Wt Readings from Last 3 Encounters:  04/09/20 228 lb 3.2 oz (103.5 kg)  03/27/20 231 lb 8 oz (105 kg)  02/03/20 235 lb (106.6 kg)    ------------------------------------------------------------------------  Lipid/Cholesterol, Follow-up:   Last seen for this7 months ago.  Management changes since that visit include atovastatin 20mg . Pt is currently not taking medication . Last Lipid Panel:    Component Value Date/Time   CHOL 188 02/25/2019 0000   TRIG 218 (A) 02/25/2019 0000   HDL 33 (A) 02/25/2019 0000   LDLCALC 111 02/25/2019 0000    Risk factors for vascular disease include diabetes mellitus, hypercholesterolemia and hypertension  He reports poor compliance with treatment. He is not having side effects.  Pt elects not to take medication because he feels no need to. Current symptoms include none and have been stable. Weight trend: decreasing steadily Prior visit with dietician: no Current diet: in general, a "healthy" diet   Current exercise:  cardiovascular workout on exercise equipment  Wt Readings from Last 3 Encounters:  04/09/20 228 lb 3.2 oz (103.5 kg)  03/27/20 231 lb 8 oz (105 kg)  02/03/20 235 lb (106.6 kg)    -------------------------------------------------------------------   Unintentional Weightloss  Pt  weighted 250 1 year ago Pt was trying to lose weight and goal was 8 Pt is 228 in office today Pt is concerned that he is losing weight too fast Pt has not adjusted diet or exercise since reaching goal weight  Pt is positive for fatigue Denies fever, nausea, vomiting, changes in stool, changes in urination,   ------------------------------------------------------------------------------------   Difficulty Balancing  Pt states symptoms worsened 6 months Symptoms are unchanged since onset Pt attributes part of the symptoms to ankles given that he has had ankle surgery Pt states that he feels unbalanced Pt states he is unable to put full body weight on right ankle   Denies dizziness, numbness, tingling,  Pt states he has ataxia  ------------------------------------------------------------------------------------  Perianal Hemorrhoid   Pt states that he has a hemorrhoid that appeared 2 weeks States that location is on right side of anus on the outside Pt is using hydrocortisone rectal cream Pt states that since starting cream, size has decreased Pt denies pain or difficulty making stool Pt denies straining while going to the bathroom  Pt denies bleeding and changes in stool,    ------------------------------------------------------------------------------------      Social History   Tobacco Use  . Smoking status: Never Smoker  . Smokeless tobacco: Current User    Types: Snuff  . Tobacco comment: tobaco pouches that produce juice  Substance Use Topics  . Alcohol use: Yes    Comment: socially, nothing every week  . Drug use: No   Social History   Socioeconomic History  . Marital status: Married    Spouse name: Not on file  . Number of children: 0  . Years of education: Not on file  . Highest education level: Not on file  Occupational History  . Not on file  Tobacco Use  . Smoking status: Never Smoker  . Smokeless tobacco: Current User    Types: Snuff  .  Tobacco comment: tobaco pouches that produce juice  Substance and Sexual Activity  . Alcohol use: Yes    Comment: socially, nothing every week  . Drug use: No  . Sexual activity: Yes    Partners: Female    Birth control/protection: None  Other Topics Concern  . Not on file  Social History Narrative  . Not on file   Social Determinants of Health   Financial Resource Strain:   . Difficulty of Paying Living Expenses:   Food Insecurity:   . Worried About Charity fundraiser in the Last Year:   . Arboriculturist in the Last Year:   Transportation Needs:   . Film/video editor (Medical):   Marland Kitchen Lack of Transportation (Non-Medical):   Physical Activity:   . Days of Exercise per Week:   . Minutes of Exercise per Session:   Stress:   . Feeling of Stress :   Social Connections:   . Frequency of Communication with Friends and Family:   . Frequency of Social Gatherings with Friends and Family:   . Attends Religious Services:   . Active Member of Clubs or Organizations:   . Attends Archivist Meetings:   Marland Kitchen Marital Status:   Intimate Partner Violence:   . Fear of Current or Ex-Partner:   .  Emotionally Abused:   Marland Kitchen Physically Abused:   . Sexually Abused:        Medications: Outpatient Medications Prior to Visit  Medication Sig  . atorvastatin (LIPITOR) 20 MG tablet Take 20 mg by mouth daily.  . Azelastine-Fluticasone 137-50 MCG/ACT SUSP One spray each nostril twice daily.  . finasteride (PROSCAR) 5 MG tablet Take 5 mg by mouth daily.  . fluticasone (FLONASE) 50 MCG/ACT nasal spray instill 1 to 2 sprays into each nostril once daily  . glipiZIDE (GLUCOTROL XL) 5 MG 24 hr tablet TK 1 T PO QD  . hydrocortisone (ANUSOL-HC) 2.5 % rectal cream Place 1 application rectally 2 (two) times daily.  Marland Kitchen lisinopril (ZESTRIL) 40 MG tablet Take one tablet daily  . metFORMIN (GLUCOPHAGE-XR) 500 MG 24 hr tablet TK 4 TS PO QAM  . montelukast (SINGULAIR) 10 MG tablet Take 1 tablet (10 mg  total) by mouth daily as needed.  Marland Kitchen NEEDLE, DISP, 21 G (BD SAFETYGLIDE SHIELDED NEEDLE) 21G X 1-1/2" MISC Use 21guage needle to inject Testosterone Cypionate 29ml.  Marland Kitchen omeprazole (PRILOSEC) 20 MG capsule Take 20 mg by mouth daily.  . SYRINGE-NEEDLE, DISP, 3 ML 18G X 1-1/2" 3 ML MISC Draw up 47ml Testosterone Cypionate with 18 gauge needle.  . tamsulosin (FLOMAX) 0.4 MG CAPS capsule Take 0.4 mg by mouth daily.  Marland Kitchen testosterone cypionate (DEPOTESTOSTERONE CYPIONATE) 200 MG/ML injection Inject 0.5 mLs (100 mg total) into the muscle once a week.   No facility-administered medications prior to visit.    Review of Systems  Constitutional: Positive for fatigue and unexpected weight change. Negative for activity change, appetite change, chills, diaphoresis and fever.  HENT: Negative.   Eyes: Negative.   Respiratory: Negative.   Cardiovascular: Negative.   Gastrointestinal: Negative.   Endocrine: Negative.   Genitourinary: Negative.   Musculoskeletal: Negative.   Skin: Negative.   Allergic/Immunologic: Negative.   Neurological: Negative.   Hematological: Negative.   Psychiatric/Behavioral: Negative.     Last metabolic panel Lab Results  Component Value Date   GLUCOSE 429 (H) 03/28/2020   NA 134 (L) 03/28/2020   K 4.6 03/28/2020   CL 98 03/28/2020   CO2 25 03/28/2020   BUN 19 03/28/2020   CREATININE 1.19 03/28/2020   GFRNONAA >60 03/28/2020   GFRAA >60 03/28/2020   CALCIUM 8.9 03/28/2020   PROT 7.1 03/28/2020   ALBUMIN 3.6 03/28/2020   BILITOT 1.2 03/28/2020   ALKPHOS 68 03/28/2020   AST 23 03/28/2020   ALT 53 (H) 03/28/2020   ANIONGAP 11 03/28/2020   Last lipids Lab Results  Component Value Date   CHOL 188 02/25/2019   HDL 33 (A) 02/25/2019   LDLCALC 111 02/25/2019   TRIG 218 (A) 02/25/2019   Last hemoglobin A1c Lab Results  Component Value Date   HGBA1C 10.1 (A) 04/09/2020      Objective    BP 137/81 (BP Location: Right Arm, Patient Position: Sitting, Cuff Size:  Normal)   Pulse (!) 108   Temp (!) 97.1 F (36.2 C) (Temporal)   Wt 228 lb 3.2 oz (103.5 kg)   BMI 29.30 kg/m  BP Readings from Last 3 Encounters:  04/09/20 137/81  03/27/20 (!) 153/85  02/03/20 (!) 192/48   Wt Readings from Last 3 Encounters:  04/09/20 228 lb 3.2 oz (103.5 kg)  03/27/20 231 lb 8 oz (105 kg)  02/03/20 235 lb (106.6 kg)      Physical Exam Exam conducted with a chaperone present.  Constitutional:  Appearance: Normal appearance.  HENT:     Head: Normocephalic and atraumatic.     Mouth/Throat:     Mouth: Mucous membranes are moist.     Pharynx: Oropharynx is clear.  Eyes:     General: Vision grossly intact. Gaze aligned appropriately.     Extraocular Movements: Extraocular movements intact.     Right eye: Normal extraocular motion and no nystagmus.     Left eye: Normal extraocular motion and no nystagmus.     Conjunctiva/sclera: Conjunctivae normal.  Cardiovascular:     Rate and Rhythm: Normal rate and regular rhythm.     Pulses: Normal pulses.          Radial pulses are 2+ on the right side and 2+ on the left side.       Dorsalis pedis pulses are 2+ on the right side and 2+ on the left side.       Posterior tibial pulses are 2+ on the right side and 2+ on the left side.     Heart sounds: Normal heart sounds, S1 normal and S2 normal. Heart sounds not distant. No murmur.  Pulmonary:     Effort: Pulmonary effort is normal.     Breath sounds: Normal breath sounds.  Abdominal:     Palpations: Abdomen is soft.  Genitourinary:    Rectum: External hemorrhoid present. No tenderness, anal fissure or internal hemorrhoid.  Musculoskeletal:     Right ankle: Deformity present.  Feet:     Right foot:     Skin integrity: Skin integrity normal. No ulcer, blister, skin breakdown, erythema, warmth or fissure.     Toenail Condition: Right toenails are normal.     Left foot:     Skin integrity: Skin integrity normal. No ulcer, blister, skin breakdown, erythema,  warmth or fissure.     Toenail Condition: Left toenails are normal.  Skin:    General: Skin is warm and dry.  Neurological:     General: No focal deficit present.     Mental Status: He is alert and oriented to person, place, and time.     Cranial Nerves: Cranial nerves are intact.     Sensory: Sensation is intact.     Motor: Motor function is intact.     Coordination: Romberg sign negative.     Gait: Gait is intact. Gait normal.     Deep Tendon Reflexes: Reflexes are normal and symmetric.  Psychiatric:        Mood and Affect: Mood normal.        Behavior: Behavior normal.      Results for orders placed or performed in visit on 04/09/20  POCT glycosylated hemoglobin (Hb A1C)  Result Value Ref Range   Hemoglobin A1C 10.1 (A) 4.0 - 5.6 %   HbA1c POC (<> result, manual entry)     HbA1c, POC (prediabetic range)     HbA1c, POC (controlled diabetic range)     Est. average glucose Bld gHb Est-mCnc 243     Assessment & Plan     Problem List Items Addressed This Visit      Cardiovascular and Mediastinum   Essential hypertension    Pt with history of HTN well controlled on medication Pt is taking lisinopril BP today is 137/81  Plan: Will check lipid panel Will check comprehensive metabolic panel Will continue taking current medication as prescribed Discussed the importance of diet and exercise for hypertension control      Relevant Orders   Comprehensive metabolic  panel     Endocrine   T2DM (type 2 diabetes mellitus) ([place]) - Primary    Pt with a history of T2DM poorly controlled on 1 medication Associated with HLD and HTN Pt is currently taking metformin A1C today is 10.1; goal is under 7 Pt has new associated symptoms of difficulty balancing, and unintentional weight loss Pt is currently not on a statin  Plan: Will start pt on jardiance Continue taking metformin as prescribed Discussed with pt the importance of a low carb diet to control blood glucose and  A1C Discussed with pt benefits of restarting statin to decrease risk of CVD Pt agrees to restart on atorvastatin Discussed with pt the importance of regular follow up with ophthalmology to prevent serious visual complications Pt agrees to see opthalmology   Foot exam was preformed today Will check CMP Will check Lipid Panel Pt needs PNA vaccine at next visit       Relevant Medications   empagliflozin (JARDIANCE) 10 MG TABS tablet   Other Relevant Orders   POCT glycosylated hemoglobin (Hb A1C) (Completed)   Ambulatory referral to Ophthalmology   Hyperlipidemia associated with type 2 diabetes mellitus ([place])    Pt with history of hyperlipidemia associated with type 2 diabetes Condition is uncontrolled and pt is not currently taking medication Last LDL was 111 in March 2020 Pt was prescribed atorvastatin but elects not to take medication   Plan: Discussed and educated pt on the long term benefits of taking statin for reducing risk of CVD Pt agrees to restart atorvastatin Will check lipid panel       Relevant Medications   empagliflozin (JARDIANCE) 10 MG TABS tablet   Other Relevant Orders   Lipid Panel With LDL/HDL Ratio   Comprehensive metabolic panel     Other   Obesity    Pt who is overweight with history of obesity and BMI of 29.3 in office today Pt is losing weight from recent changes in diet and exercise  Plan: Congratulated pt on weight loss and meeting goal weight of 235 Discussed with pt the importance of continued diet and exercise to maintain healthy weights Discussed with pt the health benefits of healthy weight maintenance       Relevant Medications   empagliflozin (JARDIANCE) 10 MG TABS tablet   Other Relevant Orders   Lipid Panel With LDL/HDL Ratio   Comprehensive metabolic panel    Other Visit Diagnoses    Difficulty balancing       Unintentional weight loss       External hemorrhoid       Need for hepatitis C screening test       Relevant Orders    Hepatitis C antibody   Screening for HIV (human immunodeficiency virus)       Relevant Orders   HIV Antibody (routine testing w rflx)     5. Difficulty balancing Pt with 6 month history of difficulty balancing and feelings of unsteadiness Physical exam was normal for foot exam, vision, and cranial nerves Romberg was negative Symptoms most likely due to uncontrolled diabetes Must also consider early peripheral neuropathy given pt has a history of uncontrolled T2DM Must also consider structural etiology given pt with past surgical history of bilateral ankle surgeries and right ankle deformity with pending surgery in the future  Plan: Will control blood glucose level Will start pt on jardiance in addition to metformin Continue to follow up with foot surgeon regarding ankle surgery Will continue to monitor symptoms  6.  Unintentional weight loss Pt with unintentional weight loss after intentionally losing weight one year ago Pt goal weight was 235; weight today was 228 Symptoms likely due to uncontrolled diabetes Symptoms may also be due to not adjusting caloric intake after reaching goal weight  Plan: Will control blood glucose level Will start pt on jardiance in addition to metformin Discussed with pt the importance of adjusting caloric intake to match activity level after reaching goal weight Will continue to monitor symptoms of weight loss  7. External hemorrhoid Pt with 2 week history of non-tender external hemorrhoid with clot Pt is currently using hydrocortisone with improvement of size  Plan: Continue using hydrocortisone cream Will continue to monitor   8. Need for hepatitis C screening test Pt who is due for HCV health maintenance screen  Plan: Will check Hepatitis C labs  9. Screening for HIV (human immunodeficiency virus) Pt who is due for HIV health maintenance screen  Plan: Will check HIV labs   Return in about 3 months (around 07/10/2020) for CPE.      Chipper Herb, Medical Student [facility] [phone] (phone) [phone] (fax)  [place]

## 2020-04-09 NOTE — Assessment & Plan Note (Addendum)
Pt with a history of T2DM poorly controlled on 1 medication Associated with HLD and HTN Pt is currently taking metformin A1C today is 10.1; goal is under 7 Pt has new associated symptoms of difficulty balancing, and unintentional weight loss Pt is currently not on a statin  Plan: Will start pt on jardiance Continue taking metformin as prescribed Discussed with pt the importance of a low carb diet to control blood glucose and A1C Discussed with pt benefits of restarting statin to decrease risk of CVD Pt agrees to restart on atorvastatin Discussed with pt the importance of regular follow up with ophthalmology to prevent serious visual complications Pt agrees to see opthalmology   Foot exam was preformed today Will check CMP Will check Lipid Panel Pt needs PNA vaccine at next visit

## 2020-04-09 NOTE — Telephone Encounter (Signed)
Patient called in today and states he got clearance from hematology. The last note states he can start his Testerone back if cleared. He will be doing 0.5

## 2020-04-09 NOTE — Assessment & Plan Note (Signed)
Pt who is overweight with history of obesity and BMI of 29.3 in office today Pt is losing weight from recent changes in diet and exercise  Plan: Congratulated pt on weight loss and meeting goal weight of 235 Discussed with pt the importance of continued diet and exercise to maintain healthy weights Discussed with pt the health benefits of healthy weight maintenance

## 2020-04-10 ENCOUNTER — Other Ambulatory Visit: Payer: Self-pay | Admitting: Oncology

## 2020-04-10 ENCOUNTER — Telehealth: Payer: Self-pay

## 2020-04-10 DIAGNOSIS — Z79890 Hormone replacement therapy (postmenopausal): Secondary | ICD-10-CM

## 2020-04-10 DIAGNOSIS — D751 Secondary polycythemia: Secondary | ICD-10-CM

## 2020-04-10 LAB — JAK2 V617F, W REFLEX TO CALR/E12/MPL

## 2020-04-10 LAB — CALR + JAK2 E12-15 + MPL (REFLEXED)

## 2020-04-10 NOTE — Telephone Encounter (Signed)
Copied from [place] [id number]. Topic: General - Call Back - No Documentation >> Apr 10, 2020  3:49 PM Erick Blinks wrote: Reason for CRM: Pt returned phone call from [place] contact: [phone]

## 2020-04-11 ENCOUNTER — Telehealth: Payer: Self-pay

## 2020-04-11 NOTE — Telephone Encounter (Signed)
Left detailed message on his personal cell phone.  Advised him to call office with any questions or if change in appt needed.

## 2020-04-11 NOTE — Telephone Encounter (Signed)
-----   Message from Earlie Server, MD sent at 04/10/2020 11:25 PM EDT ----- Please let him know that his blood work up results are good.  He may restart testosterone and please arrange him to see me in 10 weeks , lab MD +/- phlebotomy. Thanks.

## 2020-04-11 NOTE — Telephone Encounter (Signed)
Done..Marland KitchenMarland KitchenMarland KitchenMarland Kitchen  Pt has been scheduled as requested per Dr.Yu..  10 weeks , lab/MD +/- phlebotomy scheduled on 06/20/20 Labs @ 1:15, MD 1:45 & Infusion @ 2:15

## 2020-04-13 DIAGNOSIS — E1169 Type 2 diabetes mellitus with other specified complication: Secondary | ICD-10-CM | POA: Diagnosis not present

## 2020-04-13 DIAGNOSIS — Z6830 Body mass index (BMI) 30.0-30.9, adult: Secondary | ICD-10-CM | POA: Diagnosis not present

## 2020-04-13 DIAGNOSIS — E669 Obesity, unspecified: Secondary | ICD-10-CM | POA: Diagnosis not present

## 2020-04-13 DIAGNOSIS — E785 Hyperlipidemia, unspecified: Secondary | ICD-10-CM | POA: Diagnosis not present

## 2020-04-13 DIAGNOSIS — I10 Essential (primary) hypertension: Secondary | ICD-10-CM | POA: Diagnosis not present

## 2020-04-14 LAB — COMPREHENSIVE METABOLIC PANEL
ALT: 29 IU/L (ref 0–44)
AST: 18 IU/L (ref 0–40)
Albumin/Globulin Ratio: 1.8 (ref 1.2–2.2)
Albumin: 4.2 g/dL (ref 3.8–4.8)
Alkaline Phosphatase: 84 IU/L (ref 39–117)
BUN/Creatinine Ratio: 21 (ref 10–24)
BUN: 19 mg/dL (ref 8–27)
Bilirubin Total: 1.1 mg/dL (ref 0.0–1.2)
CO2: 20 mmol/L (ref 20–29)
Calcium: 9.5 mg/dL (ref 8.6–10.2)
Chloride: 99 mmol/L (ref 96–106)
Creatinine, Ser: 0.89 mg/dL (ref 0.76–1.27)
GFR calc Af Amer: 104 mL/min/{1.73_m2} (ref >59)
GFR calc non Af Amer: 90 mL/min/{1.73_m2} (ref >59)
Globulin, Total: 2.4 g/dL (ref 1.5–4.5)
Glucose: 225 mg/dL — ABNORMAL HIGH (ref 65–99)
Potassium: 4.7 mmol/L (ref 3.5–5.2)
Sodium: 137 mmol/L (ref 134–144)
Total Protein: 6.6 g/dL (ref 6.0–8.5)

## 2020-04-14 LAB — LIPID PANEL WITH LDL/HDL RATIO
Cholesterol, Total: 229 mg/dL — ABNORMAL HIGH (ref 100–199)
HDL: 42 mg/dL (ref >39)
LDL Chol Calc (NIH): 145 mg/dL — ABNORMAL HIGH (ref 0–99)
LDL/HDL Ratio: 3.5 ratio (ref 0.0–3.6)
Triglycerides: 232 mg/dL — ABNORMAL HIGH (ref 0–149)
VLDL Cholesterol Cal: 42 mg/dL — ABNORMAL HIGH (ref 5–40)

## 2020-04-14 LAB — HEPATITIS C ANTIBODY: Hep C Virus Ab: <0.1 s/co ratio (ref 0.0–0.9)

## 2020-04-14 LAB — HIV ANTIBODY (ROUTINE TESTING W REFLEX): HIV Screen 4th Generation wRfx: NONREACTIVE

## 2020-04-17 ENCOUNTER — Telehealth: Payer: Self-pay

## 2020-04-17 DIAGNOSIS — E1169 Type 2 diabetes mellitus with other specified complication: Secondary | ICD-10-CM

## 2020-04-17 MED ORDER — ATORVASTATIN CALCIUM 40 MG PO TABS: 40.0000 mg | ORAL_TABLET | Freq: Every day | ORAL | 3 refills | Status: DC

## 2020-04-17 NOTE — Telephone Encounter (Signed)
-----   Message from Virginia Crews, MD sent at 04/17/2020  9:46 AM EDT ----- Normal kidney function, liver function, electrolytes.  Negative Hep C screening.    The 10-year ASCVD (heart disease/stroke) risk score Jonathan Bussing DC Jr., et al., 2013) is: 34.8%.  This is high.  Recommend statin medication to lower cholesterol and this risk.  If patient agrees, ok to Rx Atorvastatin 40mg  daily #90 r3.  Recheck in 3 months

## 2020-04-17 NOTE — Telephone Encounter (Signed)
Patient advised as below. Patient verbalizes understanding and is in agreement with treatment plan.  

## 2020-05-29 ENCOUNTER — Ambulatory Visit: Payer: Self-pay | Admitting: Family Medicine

## 2020-05-31 ENCOUNTER — Ambulatory Visit: Payer: Self-pay | Admitting: Family Medicine

## 2020-06-01 LAB — HM DIABETES EYE EXAM

## 2020-06-05 ENCOUNTER — Ambulatory Visit (INDEPENDENT_AMBULATORY_CARE_PROVIDER_SITE_OTHER): Payer: BC Managed Care – PPO | Admitting: Physician Assistant

## 2020-06-05 ENCOUNTER — Other Ambulatory Visit: Payer: Self-pay

## 2020-06-05 ENCOUNTER — Encounter: Payer: Self-pay | Admitting: Physician Assistant

## 2020-06-05 VITALS — BP 131/84 | HR 98 | Temp 98.0°F | Ht 74.0 in | Wt 232.0 lb

## 2020-06-05 DIAGNOSIS — E785 Hyperlipidemia, unspecified: Secondary | ICD-10-CM

## 2020-06-05 DIAGNOSIS — E1169 Type 2 diabetes mellitus with other specified complication: Secondary | ICD-10-CM | POA: Diagnosis not present

## 2020-06-05 LAB — POCT GLYCOSYLATED HEMOGLOBIN (HGB A1C): Hemoglobin A1C: 9.1 % — AB (ref 4.0–5.6)

## 2020-06-05 NOTE — Progress Notes (Signed)
Patient: Jonathan Fields   DOB: [birth year]   64 y.o. Male  MRN: [id number] Visit Date: 06/05/2020  Today's healthcare provider: Trinna Post, PA-C   Chief Complaint  Patient presents with  . Diabetes   Subjective    Jonathan Fields is a 64 y.o. male who presents today for a follow up. He is here to follow up on diabetes. He reports that he was seen at [facility] clinic for a DOT physical on 6/24   Diabetes Mellitus Type II, Follow-up  Lab Results  Component Value Date   HGBA1C 9.1 (A) 06/05/2020   HGBA1C 10.1 (A) 04/09/2020   HGBA1C 7.3 (H) 02/09/2019   Wt Readings from Last 3 Encounters:  06/05/20 232 lb (105.2 kg)  04/09/20 228 lb 3.2 oz (103.5 kg)  03/27/20 231 lb 8 oz (105 kg)   Last seen for diabetes 2 months ago.  Management since then includes starting Jardiance 10mg . He is supposed to be taking Metformin 2000mg  daily, but he has not been doing so. He was also previously on Glipizide 5mg  daily, but he stopped for unknown reasons.   He reports poor compliance with treatment. He is not having side effects.  Symptoms: No fatigue No foot ulcerations  No appetite changes No nausea  No paresthesia of the feet  No polydipsia  No polyuria No visual disturbances   No vomiting     Home blood sugar records: not being checked  Episodes of hypoglycemia? No    Current insulin regiment: none Most Recent Eye Exam: 06/01/2020. Will request records from [facility].  Current exercise: none Current diet habits: well balanced  Pertinent Labs: Lab Results  Component Value Date   CHOL 229 (H) 04/13/2020   HDL 42 04/13/2020   LDLCALC 145 (H) 04/13/2020   TRIG 232 (H) 04/13/2020   Lab Results  Component Value Date   NA 137 04/13/2020   K 4.7 04/13/2020   CREATININE 0.89 04/13/2020   GFRNONAA 90 04/13/2020   GFRAA 104 04/13/2020   GLUCOSE 225 (H) 04/13/2020     Lipid/Cholesterol, Follow-up  Last lipid panel Other pertinent labs  Lab Results  Component  Value Date   CHOL 229 (H) 04/13/2020   HDL 42 04/13/2020   LDLCALC 145 (H) 04/13/2020   TRIG 232 (H) 04/13/2020   Lab Results  Component Value Date   ALT 29 04/13/2020   AST 18 04/13/2020   PLT 218 03/28/2020     He was last seen for this 2 months ago.  Management since that visit includes starting Atorvastatin 40mg . However, patient reports that he has not been taking this. States, "it makes me feel like crap".   He reports poor compliance with treatment. He is not having side effects.   Symptoms: No chest pain No chest pressure/discomfort  No dyspnea No lower extremity edema  No numbness or tingling of extremity No orthopnea  No palpitations No paroxysmal nocturnal dyspnea  No speech difficulty No syncope   Current diet: well balanced Current exercise: none  The 10-year ASCVD risk score Jonathan Fields DC Jonathan Fields., et al., 2013) is: 32.6%   Past Medical History:  Diagnosis Date  . BPH (benign prostatic hyperplasia)   . Bronchitis   . Complication of anesthesia    hard to wake up  . DM (diabetes mellitus), type 2 ([place])   . DVT (deep venous thrombosis) ([place])    post op  . ED (erectile dysfunction)   . GERD (gastroesophageal reflux  disease)   . Hyperlipemia   . Hypertension   . Hypogonadism in male   . Nocturia   . Right ankle instability   . Sleep apnea   . TB lung, latent   . Urinary frequency    Past Surgical History:  Procedure Laterality Date  . ANKLE SURGERY Left    torn ligament  . CARPAL TUNNEL RELEASE Right 08/2019  . COLONOSCOPY  2009  . KNEE ARTHROSCOPY Left   . right ankle ruptured tendon surgery  2008  . TRANSURETHRAL RESECTION OF PROSTATE N/A 02/19/2018   Procedure: TRANSURETHRAL RESECTION OF THE PROSTATE (TURP);  Surgeon: [name], MD;  Location: [facility] ORS;  Service: Urology;  Laterality: N/A;   Social History   Socioeconomic History  . Marital status: Married    Spouse name: Not on file  . Number of children: 0  . Years of education: Not on  file  . Highest education level: Not on file  Occupational History  . Not on file  Tobacco Use  . Smoking status: Never Smoker  . Smokeless tobacco: Current User    Types: Snuff  . Tobacco comment: tobaco pouches that produce juice  Vaping Use  . Vaping Use: Never used  Substance and Sexual Activity  . Alcohol use: Yes    Comment: socially, nothing every week  . Drug use: No  . Sexual activity: Yes    Partners: Female    Birth control/protection: None  Other Topics Concern  . Not on file  Social History Narrative  . Not on file   Social Determinants of Health   Financial Resource Strain:   . Difficulty of Paying Living Expenses:   Food Insecurity:   . Worried About Charity fundraiser in the Last Year:   . Arboriculturist in the Last Year:   Transportation Needs:   . Film/video editor (Medical):   Marland Kitchen Lack of Transportation (Non-Medical):   Physical Activity:   . Days of Exercise per Week:   . Minutes of Exercise per Session:   Stress:   . Feeling of Stress :   Social Connections:   . Frequency of Communication with Friends and Family:   . Frequency of Social Gatherings with Friends and Family:   . Attends Religious Services:   . Active Member of Clubs or Organizations:   . Attends Archivist Meetings:   Marland Kitchen Marital Status:   Intimate Partner Violence:   . Fear of Current or Ex-Partner:   . Emotionally Abused:   Marland Kitchen Physically Abused:   . Sexually Abused:    Family Status  Relation Name Status  . Father  Deceased  . Mother  Alive  . Neg Hx  (Not Specified)   Family History  Problem Relation Age of Onset  . Heart disease Father   . Hypertension Father   . Prostate cancer Father   . Heart disease Mother   . Breast cancer Mother   . Colon cancer Neg Hx    Allergies  Allergen Reactions  . Penicillins Hives    Has patient had a PCN reaction causing immediate rash, facial/tongue/throat swelling, SOB or lightheadedness with hypotension: yes Has  patient had a PCN reaction causing severe rash involving mucus membranes or skin necrosis: no Has patient had a PCN reaction that required hospitalization: no Has patient had a PCN reaction occurring within the last 10 years: no If all of the above answers are "NO", then may proceed with Cephalosporin use.  Patient Care Team: Virginia Crews, MD as PCP - General (Family Medicine)   Medications: Outpatient Medications Prior to Visit  Medication Sig  . atorvastatin (LIPITOR) 40 MG tablet Take 1 tablet (40 mg total) by mouth daily.  . empagliflozin (JARDIANCE) 10 MG TABS tablet Take 10 mg by mouth daily before breakfast.  . finasteride (PROSCAR) 5 MG tablet Take 5 mg by mouth daily.  . fluticasone (FLONASE) 50 MCG/ACT nasal spray instill 1 to 2 sprays into each nostril once daily  . glipiZIDE (GLUCOTROL XL) 5 MG 24 hr tablet TK 1 T PO QD  . hydrocortisone (ANUSOL-HC) 2.5 % rectal cream Place 1 application rectally 2 (two) times daily.  Marland Kitchen lisinopril (ZESTRIL) 40 MG tablet Take one tablet daily  . metFORMIN (GLUCOPHAGE-XR) 500 MG 24 hr tablet TK 4 TS PO QAM  . montelukast (SINGULAIR) 10 MG tablet Take 1 tablet (10 mg total) by mouth daily as needed.  Marland Kitchen NEEDLE, DISP, 21 G (BD SAFETYGLIDE SHIELDED NEEDLE) 21G X 1-1/2" MISC Use 21guage needle to inject Testosterone Cypionate 60ml.  Marland Kitchen omeprazole (PRILOSEC) 20 MG capsule Take 20 mg by mouth daily.  . SYRINGE-NEEDLE, DISP, 3 ML 18G X 1-1/2" 3 ML MISC Draw up 37ml Testosterone Cypionate with 18 gauge needle.  . tamsulosin (FLOMAX) 0.4 MG CAPS capsule Take 0.4 mg by mouth daily.  Marland Kitchen testosterone cypionate (DEPOTESTOSTERONE CYPIONATE) 200 MG/ML injection Inject 0.5 mLs (100 mg total) into the muscle once a week.  . Azelastine-Fluticasone 137-50 MCG/ACT SUSP One spray each nostril twice daily. (Patient not taking: Reported on 06/05/2020)   No facility-administered medications prior to visit.    Review of Systems  Constitutional: Negative.    Respiratory: Negative.   Cardiovascular: Negative.   Endocrine: Negative.   Musculoskeletal: Negative.   Skin: Negative.   Neurological: Negative.   Psychiatric/Behavioral: Negative.       Objective    BP 131/84 (BP Location: Right Arm)   Pulse 98   Temp 98 F (36.7 C)   Ht 6\' 2"  (1.88 m)   Wt 232 lb (105.2 kg)   BMI 29.79 kg/m    Physical Exam Constitutional:      Appearance: Normal appearance.  Cardiovascular:     Rate and Rhythm: Normal rate and regular rhythm.     Pulses: Normal pulses.     Heart sounds: Normal heart sounds.  Pulmonary:     Effort: Pulmonary effort is normal.     Breath sounds: Normal breath sounds.  Skin:    General: Skin is warm and dry.  Neurological:     Mental Status: He is alert and oriented to person, place, and time. Mental status is at baseline.  Psychiatric:        Mood and Affect: Mood normal.        Behavior: Behavior normal.       Last depression screening scores PHQ 2/9 Scores 09/22/2019  PHQ - 2 Score 1  PHQ- 9 Score 3   Last fall risk screening Fall Risk  09/22/2019  Falls in the past year? 0  Number falls in past yr: 0  Injury with Fall? 0  Follow up Falls evaluation completed   Last Audit-C alcohol use screening Alcohol Use Disorder Test (AUDIT) 09/22/2019  1. How often do you have a drink containing alcohol? 1  2. How many drinks containing alcohol do you have on a typical day when you are drinking? 0  3. How often do you have six or more drinks on  one occasion? 0  AUDIT-C Score 1  Alcohol Brief Interventions/Follow-up AUDIT Score <7 follow-up not indicated   A score of 3 or more in women, and 4 or more in men indicates increased risk for alcohol abuse, EXCEPT if all of the points are from question 1   Results for orders placed or performed in visit on 06/05/20  POCT glycosylated hemoglobin (Hb A1C)  Result Value Ref Range   Hemoglobin A1C 9.1 (A) 4.0 - 5.6 %   HbA1c POC (<> result, manual entry)     HbA1c,  POC (prediabetic range)     HbA1c, POC (controlled diabetic range)      Assessment & Plan    1. Type 2 diabetes mellitus with other specified complication, without long-term current use of insulin (HCC) Uncontrolled with last A1c 9.1 He is not compliant with his current medications. Advised to take Metformin and Jardiance consistently, and add back Glipizide. Request Eye exam. Patient requested [place] not to send records to DOT for physical. Advised I cannot clear him with uncontrolled A1c. He wants to follow up first.  Discussed diet and exercise F/u in 3 weeks.   - POCT glycosylated hemoglobin (Hb A1C)  2. Hyperlipidemia associated with type 2 diabetes mellitus ([place]) Not compliant with Statin. Will address at next visit.   Routine Health Maintenance and Physical Exam  Exercise Activities and Dietary recommendations Goals   None     Immunization History  Administered Date(s) Administered  . Influenza,inj,Quad PF,6+ Mos 09/09/2019  . Tdap 02/26/2016    Health Maintenance  Topic Date Due  . OPHTHALMOLOGY EXAM  Never done  . COVID-19 Vaccine (1) Never done  . COLONOSCOPY  Never done  . PNA vac Low Risk Adult (1 of 2 - PCV13) Never done  . INFLUENZA VACCINE  07/01/2020  . HEMOGLOBIN A1C  12/06/2020  . FOOT EXAM  04/09/2021  . TETANUS/TDAP  02/25/2026  . Hepatitis C Screening  Completed  . HIV Screening  Completed    Discussed health benefits of physical activity, and encouraged him to engage in regular exercise appropriate for his age and condition.    ITrinna Post, PA-C, have reviewed all documentation for this visit. The documentation on 06/05/20 for the exam, diagnosis, procedures, and orders are all accurate and complete.    Paulene Floor  [facility] [phone] (phone) [phone] (fax)  [place]

## 2020-06-07 ENCOUNTER — Encounter: Payer: Self-pay | Admitting: Family Medicine

## 2020-06-20 ENCOUNTER — Inpatient Hospital Stay: Payer: BC Managed Care – PPO

## 2020-06-20 ENCOUNTER — Inpatient Hospital Stay: Payer: BC Managed Care – PPO | Admitting: Oncology

## 2020-06-23 ENCOUNTER — Other Ambulatory Visit: Payer: Self-pay | Admitting: Internal Medicine

## 2020-06-25 NOTE — Progress Notes (Signed)
Established patient visit   Patient: Jonathan Fields   DOB: [birth year]   64 y.o. Male  MRN: [id number] Visit Date: 06/26/2020  Today's healthcare provider: Trinna Post, PA-C   Chief Complaint  Patient presents with  . Diabetes  I,Momo Braun M Maxim Bedel,acting as a scribe for [employer], PA-C.,have documented all relevant documentation on the behalf of Trinna Post, PA-C,as directed by  Trinna Post, PA-C while in the presence of Trinna Post, PA-C.  Subjective    HPI  Diabetes Mellitus Type II, Follow-up  Lab Results  Component Value Date   HGBA1C 9.1 (A) 06/05/2020   HGBA1C 10.1 (A) 04/09/2020   HGBA1C 7.3 (H) 02/09/2019   Wt Readings from Last 3 Encounters:  06/26/20 (!) 231 lb 12.8 oz (105.1 kg)  06/05/20 232 lb (105.2 kg)  04/09/20 228 lb 3.2 oz (103.5 kg)   Last seen for diabetes 3 weeks ago.  Management since then includes added back Glipizide and take other medications consistently.  He reports good compliance with treatment. He is not having side effects.  Symptoms: No fatigue No foot ulcerations  No appetite changes No nausea  No paresthesia of the feet  No polydipsia  No polyuria No visual disturbances   No vomiting     Home blood sugar records: 100's-160's  Episodes of hypoglycemia? No    Current insulin regiment: none Most Recent Eye Exam: UTD Current exercise: walking Current diet habits: well balanced  Pertinent Labs: Lab Results  Component Value Date   CHOL 229 (H) 04/13/2020   HDL 42 04/13/2020   LDLCALC 145 (H) 04/13/2020   TRIG 232 (H) 04/13/2020   Lab Results  Component Value Date   NA 137 04/13/2020   K 4.7 04/13/2020   CREATININE 0.89 04/13/2020   GFRNONAA 90 04/13/2020   GFRAA 104 04/13/2020   GLUCOSE 225 (H) 04/13/2020     [place].  ---------------------------------------------------------------------------------------------------      Medications: Outpatient Medications  Prior to Visit  Medication Sig  . atorvastatin (LIPITOR) 40 MG tablet Take 1 tablet (40 mg total) by mouth daily.  . Azelastine-Fluticasone 137-50 MCG/ACT SUSP One spray each nostril twice daily.  . empagliflozin (JARDIANCE) 10 MG TABS tablet Take 10 mg by mouth daily before breakfast.  . finasteride (PROSCAR) 5 MG tablet Take 5 mg by mouth daily.  . fluticasone (FLONASE) 50 MCG/ACT nasal spray instill 1 to 2 sprays into each nostril once daily  . glipiZIDE (GLUCOTROL XL) 5 MG 24 hr tablet TK 1 T PO QD  . hydrocortisone (ANUSOL-HC) 2.5 % rectal cream Place 1 application rectally 2 (two) times daily.  Marland Kitchen lisinopril (ZESTRIL) 40 MG tablet Take one tablet daily  . metFORMIN (GLUCOPHAGE-XR) 500 MG 24 hr tablet TK 4 TS PO QAM  . montelukast (SINGULAIR) 10 MG tablet Take 1 tablet (10 mg total) by mouth daily as needed.  Marland Kitchen NEEDLE, DISP, 21 G (BD SAFETYGLIDE SHIELDED NEEDLE) 21G X 1-1/2" MISC Use 21guage needle to inject Testosterone Cypionate 59ml.  Marland Kitchen omeprazole (PRILOSEC) 20 MG capsule Take 20 mg by mouth daily.  . SYRINGE-NEEDLE, DISP, 3 ML 18G X 1-1/2" 3 ML MISC Draw up 37ml Testosterone Cypionate with 18 gauge needle.  . tamsulosin (FLOMAX) 0.4 MG CAPS capsule Take 0.4 mg by mouth daily.  Marland Kitchen testosterone cypionate (DEPOTESTOSTERONE CYPIONATE) 200 MG/ML injection Inject 0.5 mLs (100 mg total) into the muscle once a week.   No facility-administered medications prior to visit.  Review of Systems  Constitutional: Negative.   Respiratory: Negative.   Hematological: Negative.       Objective    BP (!) 126/88 (BP Location: Left Arm, Patient Position: Sitting, Cuff Size: Normal)   Pulse 73   Temp (!) 96.9 F (36.1 C) (Temporal)   Wt (!) 231 lb 12.8 oz (105.1 kg)   SpO2 97%   BMI 29.76 kg/m    Physical Exam Constitutional:      Appearance: Normal appearance.  Cardiovascular:     Rate and Rhythm: Normal rate and regular rhythm.     Heart sounds: Normal heart sounds.  Pulmonary:      Effort: Pulmonary effort is normal.     Breath sounds: Normal breath sounds.  Skin:    General: Skin is warm and dry.  Neurological:     Mental Status: He is alert and oriented to person, place, and time. Mental status is at baseline.  Psychiatric:        Mood and Affect: Mood normal.        Behavior: Behavior normal.       No results found for any visits on 06/26/20.  Assessment & Plan    1. Type 2 diabetes mellitus with other specified complication, without long-term current use of insulin (HCC)  His hemoglobin was too high for our in office machine to read. Will check through [facility]. Job clearance pending results. - HgB A1c  2. Erythrocytosis  - CBC with Differential    Return in about 3 months (around 09/26/2020) for DM.      ITrinna Post, PA-C, have reviewed all documentation for this visit. The documentation on 06/26/20 for the exam, diagnosis, procedures, and orders are all accurate and complete.    Paulene Floor  [facility] [phone] (phone) [phone] (fax)  [place]

## 2020-06-26 ENCOUNTER — Other Ambulatory Visit: Payer: Self-pay

## 2020-06-26 ENCOUNTER — Other Ambulatory Visit: Payer: Self-pay | Admitting: Physician Assistant

## 2020-06-26 ENCOUNTER — Ambulatory Visit (INDEPENDENT_AMBULATORY_CARE_PROVIDER_SITE_OTHER): Payer: BC Managed Care – PPO | Admitting: Physician Assistant

## 2020-06-26 ENCOUNTER — Encounter: Payer: Self-pay | Admitting: Physician Assistant

## 2020-06-26 VITALS — BP 126/88 | HR 73 | Temp 96.9°F | Wt 231.8 lb

## 2020-06-26 DIAGNOSIS — E1169 Type 2 diabetes mellitus with other specified complication: Secondary | ICD-10-CM

## 2020-06-26 DIAGNOSIS — D751 Secondary polycythemia: Secondary | ICD-10-CM | POA: Diagnosis not present

## 2020-06-26 NOTE — Patient Instructions (Signed)
Diabetes Mellitus and Exercise Exercising regularly is important for your overall health, especially when you have diabetes (diabetes mellitus). Exercising is not only about losing weight. It has many other health benefits, such as increasing muscle strength and bone density and reducing body fat and stress. This leads to improved fitness, flexibility, and endurance, all of which result in better overall health. Exercise has additional benefits for people with diabetes, including:  Reducing appetite.  Helping to lower and control blood glucose.  Lowering blood pressure.  Helping to control amounts of fatty substances (lipids) in the blood, such as cholesterol and triglycerides.  Helping the body to respond better to insulin (improving insulin sensitivity).  Reducing how much insulin the body needs.  Decreasing the risk for heart disease by: ? Lowering cholesterol and triglyceride levels. ? Increasing the levels of good cholesterol. ? Lowering blood glucose levels. What is my activity plan? Your health care provider or certified diabetes educator can help you make a plan for the type and frequency of exercise (activity plan) that works for you. Make sure that you:  Do at least 150 minutes of moderate-intensity or vigorous-intensity exercise each week. This could be brisk walking, biking, or water aerobics. ? Do stretching and strength exercises, such as yoga or weightlifting, at least 2 times a week. ? Spread out your activity over at least 3 days of the week.  Get some form of physical activity every day. ? Do not go more than 2 days in a row without some kind of physical activity. ? Avoid being inactive for more than 30 minutes at a time. Take frequent breaks to walk or stretch.  Choose a type of exercise or activity that you enjoy, and set realistic goals.  Start slowly, and gradually increase the intensity of your exercise over time. What do I need to know about managing my  diabetes?   Check your blood glucose before and after exercising. ? If your blood glucose is 240 mg/dL (13.3 mmol/L) or higher before you exercise, check your urine for ketones. If you have ketones in your urine, do not exercise until your blood glucose returns to normal. ? If your blood glucose is 100 mg/dL (5.6 mmol/L) or lower, eat a snack containing 15-20 grams of carbohydrate. Check your blood glucose 15 minutes after the snack to make sure that your level is above 100 mg/dL (5.6 mmol/L) before you start your exercise.  Know the symptoms of low blood glucose (hypoglycemia) and how to treat it. Your risk for hypoglycemia increases during and after exercise. Common symptoms of hypoglycemia can include: ? Hunger. ? Anxiety. ? Sweating and feeling clammy. ? Confusion. ? Dizziness or feeling light-headed. ? Increased heart rate or palpitations. ? Blurry vision. ? Tingling or numbness around the mouth, lips, or tongue. ? Tremors or shakes. ? Irritability.  Keep a rapid-acting carbohydrate snack available before, during, and after exercise to help prevent or treat hypoglycemia.  Avoid injecting insulin into areas of the body that are going to be exercised. For example, avoid injecting insulin into: ? The arms, when playing tennis. ? The legs, when jogging.  Keep records of your exercise habits. Doing this can help you and your health care provider adjust your diabetes management plan as needed. Write down: ? Food that you eat before and after you exercise. ? Blood glucose levels before and after you exercise. ? The type and amount of exercise you have done. ? When your insulin is expected to peak, if you use   insulin. Avoid exercising at times when your insulin is peaking.  When you start a new exercise or activity, work with your health care provider to make sure the activity is safe for you, and to adjust your insulin, medicines, or food intake as needed.  Drink plenty of water while  you exercise to prevent dehydration or heat stroke. Drink enough fluid to keep your urine clear or pale yellow. Summary  Exercising regularly is important for your overall health, especially when you have diabetes (diabetes mellitus).  Exercising has many health benefits, such as increasing muscle strength and bone density and reducing body fat and stress.  Your health care provider or certified diabetes educator can help you make a plan for the type and frequency of exercise (activity plan) that works for you.  When you start a new exercise or activity, work with your health care provider to make sure the activity is safe for you, and to adjust your insulin, medicines, or food intake as needed. This information is not intended to replace advice given to you by your health care provider. Make sure you discuss any questions you have with your health care provider. Document Revised: 06/11/2017 Document Reviewed: 04/28/2016 Elsevier Patient Education  2020 [employer]  

## 2020-06-27 LAB — CBC WITH DIFFERENTIAL/PLATELET
Basophils Absolute: 0 10*3/uL (ref 0.0–0.2)
Basos: 1 %
EOS (ABSOLUTE): 0 10*3/uL (ref 0.0–0.4)
Eos: 1 %
Hematocrit: 56.9 % — ABNORMAL HIGH (ref 37.5–51.0)
Hemoglobin: 18.6 g/dL — ABNORMAL HIGH (ref 13.0–17.7)
Immature Grans (Abs): 0.1 10*3/uL (ref 0.0–0.1)
Immature Granulocytes: 1 %
Lymphocytes Absolute: 1.1 10*3/uL (ref 0.7–3.1)
Lymphs: 22 %
MCH: 29 pg (ref 26.6–33.0)
MCHC: 32.7 g/dL (ref 31.5–35.7)
MCV: 89 fL (ref 79–97)
Monocytes Absolute: 0.6 10*3/uL (ref 0.1–0.9)
Monocytes: 13 %
Neutrophils Absolute: 3.1 10*3/uL (ref 1.4–7.0)
Neutrophils: 62 %
Platelets: 280 10*3/uL (ref 150–450)
RBC: 6.41 x10E6/uL — ABNORMAL HIGH (ref 4.14–5.80)
RDW: 12.4 % (ref 11.6–15.4)
WBC: 4.9 10*3/uL (ref 3.4–10.8)

## 2020-06-27 LAB — HEMOGLOBIN A1C
Est. average glucose Bld gHb Est-mCnc: 183 mg/dL
Hgb A1c MFr Bld: 8 % — ABNORMAL HIGH (ref 4.8–5.6)

## 2020-07-04 ENCOUNTER — Other Ambulatory Visit: Payer: Self-pay

## 2020-07-04 ENCOUNTER — Inpatient Hospital Stay: Payer: BC Managed Care – PPO

## 2020-07-04 ENCOUNTER — Inpatient Hospital Stay: Payer: BC Managed Care – PPO | Attending: Oncology | Admitting: Oncology

## 2020-07-04 ENCOUNTER — Encounter: Payer: Self-pay | Admitting: Oncology

## 2020-07-04 DIAGNOSIS — D751 Secondary polycythemia: Secondary | ICD-10-CM

## 2020-07-04 DIAGNOSIS — Z79890 Hormone replacement therapy (postmenopausal): Secondary | ICD-10-CM

## 2020-07-04 HISTORY — DX: Secondary polycythemia: D75.1

## 2020-07-04 LAB — CBC WITH DIFFERENTIAL/PLATELET
Abs Immature Granulocytes: 0.02 10*3/uL (ref 0.00–0.07)
Basophils Absolute: 0 10*3/uL (ref 0.0–0.1)
Basophils Relative: 1 %
Eosinophils Absolute: 0.1 10*3/uL (ref 0.0–0.5)
Eosinophils Relative: 2 %
HCT: 54.2 % — ABNORMAL HIGH (ref 39.0–52.0)
Hemoglobin: 18 g/dL — ABNORMAL HIGH (ref 13.0–17.0)
Immature Granulocytes: 0 %
Lymphocytes Relative: 24 %
Lymphs Abs: 1.1 10*3/uL (ref 0.7–4.0)
MCH: 29.2 pg (ref 26.0–34.0)
MCHC: 33.2 g/dL (ref 30.0–36.0)
MCV: 88 fL (ref 80.0–100.0)
Monocytes Absolute: 0.5 10*3/uL (ref 0.1–1.0)
Monocytes Relative: 11 %
Neutro Abs: 2.9 10*3/uL (ref 1.7–7.7)
Neutrophils Relative %: 62 %
Platelets: 237 10*3/uL (ref 150–400)
RBC: 6.16 MIL/uL — ABNORMAL HIGH (ref 4.22–5.81)
RDW: 12.1 % (ref 11.5–15.5)
WBC: 4.6 10*3/uL (ref 4.0–10.5)
nRBC: 0 % (ref 0.0–0.2)

## 2020-07-04 NOTE — Progress Notes (Signed)
HEMATOLOGY-ONCOLOGY TeleHEALTH VISIT PROGRESS NOTE  I connected with Jonathan Fields on 07/04/20 at  2:30 PM EDT by video enabled telemedicine visit and verified that I am speaking with the correct person using two identifiers. I discussed the limitations, risks, security and privacy concerns of performing an evaluation and management service by telemedicine and the availability of in-person appointments. I also discussed with the patient that there may be a patient responsible charge related to this service. The patient expressed understanding and agreed to proceed.   Other persons participating in the visit and their role in the encounter:  None  Patient's location: Home.   Provider's location: office Chief Complaint: Follow-up for erythrocytosis.   INTERVAL HISTORY Jonathan Fields is a 64 y.o. male who has above history reviewed by me today presents for follow up visit for management of secondary erythrocytosis Problems and complaints are listed below:  Patient has resumed testosterone supplementation.  He has blood work done and presents for follow-up. I attempted to connect the patient for visual enabled telehealth visit.  Due to the technical difficulties with video,  Patient was transitioned to audio only visit. Patient has no new complaints.  Review of Systems  Constitutional: Negative for appetite change, chills, fatigue, fever and unexpected weight change.  HENT:   Negative for hearing loss and voice change.   Eyes: Negative for eye problems and icterus.  Respiratory: Negative for chest tightness, cough and shortness of breath.   Cardiovascular: Negative for chest pain and leg swelling.  Gastrointestinal: Negative for abdominal distention and abdominal pain.  Endocrine: Negative for hot flashes.  Genitourinary: Negative for difficulty urinating, dysuria and frequency.   Musculoskeletal: Negative for arthralgias.  Skin: Negative for itching and rash.  Neurological: Negative  for light-headedness and numbness.  Hematological: Negative for adenopathy. Does not bruise/bleed easily.  Psychiatric/Behavioral: Negative for confusion.    Past Medical History:  Diagnosis Date  . BPH (benign prostatic hyperplasia)   . Bronchitis   . Complication of anesthesia    hard to wake up  . DM (diabetes mellitus), type 2 ([place])   . DVT (deep venous thrombosis) ([place])    post op  . ED (erectile dysfunction)   . GERD (gastroesophageal reflux disease)   . Hyperlipemia   . Hypertension   . Hypogonadism in male   . Nocturia   . Right ankle instability   . Sleep apnea   . TB lung, latent   . Urinary frequency    Past Surgical History:  Procedure Laterality Date  . ANKLE SURGERY Left    torn ligament  . CARPAL TUNNEL RELEASE Right 08/2019  . COLONOSCOPY  2009  . KNEE ARTHROSCOPY Left   . right ankle ruptured tendon surgery  2008  . TRANSURETHRAL RESECTION OF PROSTATE N/A 02/19/2018   Procedure: TRANSURETHRAL RESECTION OF THE PROSTATE (TURP);  Surgeon: [name], MD;  Location: [facility] ORS;  Service: Urology;  Laterality: N/A;    Family History  Problem Relation Age of Onset  . Heart disease Father   . Hypertension Father   . Prostate cancer Father   . Heart disease Mother   . Breast cancer Mother   . Colon cancer Neg Hx     Social History   Socioeconomic History  . Marital status: Married    Spouse name: Not on file  . Number of children: 0  . Years of education: Not on file  . Highest education level: Not on file  Occupational History  . Not  on file  Tobacco Use  . Smoking status: Never Smoker  . Smokeless tobacco: Current User    Types: Snuff  . Tobacco comment: tobaco pouches that produce juice  Vaping Use  . Vaping Use: Never used  Substance and Sexual Activity  . Alcohol use: Yes    Comment: socially, nothing every week  . Drug use: No  . Sexual activity: Yes    Partners: Female    Birth control/protection: None  Other Topics Concern  .  Not on file  Social History Narrative  . Not on file   Social Determinants of Health   Financial Resource Strain:   . Difficulty of Paying Living Expenses:   Food Insecurity:   . Worried About Charity fundraiser in the Last Year:   . Arboriculturist in the Last Year:   Transportation Needs:   . Film/video editor (Medical):   Marland Kitchen Lack of Transportation (Non-Medical):   Physical Activity:   . Days of Exercise per Week:   . Minutes of Exercise per Session:   Stress:   . Feeling of Stress :   Social Connections:   . Frequency of Communication with Friends and Family:   . Frequency of Social Gatherings with Friends and Family:   . Attends Religious Services:   . Active Member of Clubs or Organizations:   . Attends Archivist Meetings:   Marland Kitchen Marital Status:   Intimate Partner Violence:   . Fear of Current or Ex-Partner:   . Emotionally Abused:   Marland Kitchen Physically Abused:   . Sexually Abused:     Current Outpatient Medications on File Prior to Visit  Medication Sig Dispense Refill  . atorvastatin (LIPITOR) 40 MG tablet Take 1 tablet (40 mg total) by mouth daily. 90 tablet 3  . Azelastine-Fluticasone 137-50 MCG/ACT SUSP One spray each nostril twice daily. 23 g 1  . empagliflozin (JARDIANCE) 10 MG TABS tablet Take 10 mg by mouth daily before breakfast. 30 tablet 3  . finasteride (PROSCAR) 5 MG tablet Take 5 mg by mouth daily.    . fluticasone (FLONASE) 50 MCG/ACT nasal spray instill 1 to 2 sprays into each nostril once daily  0  . glipiZIDE (GLUCOTROL XL) 5 MG 24 hr tablet TK 1 T PO QD    . hydrocortisone (ANUSOL-HC) 2.5 % rectal cream Place 1 application rectally 2 (two) times daily. 30 g 0  . lisinopril (ZESTRIL) 40 MG tablet Take one tablet daily 90 tablet 3  . metFORMIN (GLUCOPHAGE-XR) 500 MG 24 hr tablet TK 4 TS PO QAM  2  . montelukast (SINGULAIR) 10 MG tablet Take 1 tablet (10 mg total) by mouth daily as needed. 90 tablet 1  . NEEDLE, DISP, 21 G (BD SAFETYGLIDE  SHIELDED NEEDLE) 21G X 1-1/2" MISC Use 21guage needle to inject Testosterone Cypionate 23ml. 25 each 0  . omeprazole (PRILOSEC) 20 MG capsule Take 20 mg by mouth daily.    . SYRINGE-NEEDLE, DISP, 3 ML 18G X 1-1/2" 3 ML MISC Draw up 69ml Testosterone Cypionate with 18 gauge needle. 25 each 0  . tamsulosin (FLOMAX) 0.4 MG CAPS capsule Take 0.4 mg by mouth daily.    Marland Kitchen testosterone cypionate (DEPOTESTOSTERONE CYPIONATE) 200 MG/ML injection Inject 0.5 mLs (100 mg total) into the muscle once a week. 10 mL 0   No current facility-administered medications on file prior to visit.    Allergies  Allergen Reactions  . Penicillins Hives    Has patient had a  PCN reaction causing immediate rash, facial/tongue/throat swelling, SOB or lightheadedness with hypotension: yes Has patient had a PCN reaction causing severe rash involving mucus membranes or skin necrosis: no Has patient had a PCN reaction that required hospitalization: no Has patient had a PCN reaction occurring within the last 10 years: no If all of the above answers are "NO", then may proceed with Cephalosporin use.        Observations/Objective: Today's Vitals   07/04/20 1417  PainSc: 0-No pain   There is no height or weight on file to calculate BMI.  Physical Exam  CBC    Component Value Date/Time   WBC 4.6 07/04/2020 1042   RBC 6.16 (H) 07/04/2020 1042   HGB 18.0 (H) 07/04/2020 1042   HGB 18.6 (H) 06/26/2020 0955   HCT 54.2 (H) 07/04/2020 1042   HCT 56.9 (H) 06/26/2020 0955   PLT 237 07/04/2020 1042   PLT 280 06/26/2020 0955   MCV 88.0 07/04/2020 1042   MCV 89 06/26/2020 0955   MCH 29.2 07/04/2020 1042   MCHC 33.2 07/04/2020 1042   RDW 12.1 07/04/2020 1042   RDW 12.4 06/26/2020 0955   LYMPHSABS 1.1 07/04/2020 1042   LYMPHSABS 1.1 06/26/2020 0955   MONOABS 0.5 07/04/2020 1042   EOSABS 0.1 07/04/2020 1042   EOSABS 0.0 06/26/2020 0955   BASOSABS 0.0 07/04/2020 1042   BASOSABS 0.0 06/26/2020 0955    CMP     Component  Value Date/Time   NA 137 04/13/2020 0818   K 4.7 04/13/2020 0818   CL 99 04/13/2020 0818   CO2 20 04/13/2020 0818   GLUCOSE 225 (H) 04/13/2020 0818   GLUCOSE 429 (H) 03/28/2020 1050   BUN 19 04/13/2020 0818   BUN 25 02/09/2019 0000   CREATININE 0.89 04/13/2020 0818   CALCIUM 9.5 04/13/2020 0818   PROT 6.6 04/13/2020 0818   ALBUMIN 4.2 04/13/2020 0818   AST 18 04/13/2020 0818   ALT 29 04/13/2020 0818   ALKPHOS 84 04/13/2020 0818   BILITOT 1.1 04/13/2020 0818   GFRNONAA 90 04/13/2020 0818   GFRAA 104 04/13/2020 0818     Assessment and Plan: 1. Erythrocytosis     Secondary erythrocytosis due to testosterone use. Proceed with phlebotomy 500 cc x 1 tomorrow and in 1 week.  H&H +/- phlebotomy in 3 weeks, 7 weeks,  Patient agrees with the plan.  Follow Up Instructions: 11 weeks   I discussed the assessment and treatment plan with the patient. The patient was provided an opportunity to ask questions and all were answered. The patient agreed with the plan and demonstrated an understanding of the instructions.  The patient was advised to call back or seek an in-person evaluation if the symptoms worsen or if the condition fails to improve as anticipated.    Earlie Server, MD 07/04/2020 7:36 PM

## 2020-07-04 NOTE — Progress Notes (Signed)
Patient denies new problems/concerns today.   °

## 2020-07-05 ENCOUNTER — Inpatient Hospital Stay: Payer: BC Managed Care – PPO

## 2020-07-05 ENCOUNTER — Telehealth: Payer: Self-pay | Admitting: *Deleted

## 2020-07-05 NOTE — Telephone Encounter (Signed)
Pt called and stated that he could not make it to his sched Phlebotomy appt today and stated that he will RTC on 07/11/20 for his already sched Phlebotomy appt

## 2020-07-11 ENCOUNTER — Inpatient Hospital Stay: Payer: BC Managed Care – PPO

## 2020-07-11 ENCOUNTER — Other Ambulatory Visit: Payer: Self-pay

## 2020-07-11 VITALS — BP 123/77 | HR 81 | Temp 98.2°F | Resp 18

## 2020-07-11 DIAGNOSIS — D751 Secondary polycythemia: Secondary | ICD-10-CM | POA: Diagnosis not present

## 2020-07-13 ENCOUNTER — Ambulatory Visit: Payer: Self-pay | Admitting: Family Medicine

## 2020-07-25 ENCOUNTER — Other Ambulatory Visit: Payer: BC Managed Care – PPO

## 2020-07-27 ENCOUNTER — Inpatient Hospital Stay: Payer: BC Managed Care – PPO

## 2020-07-27 ENCOUNTER — Other Ambulatory Visit: Payer: Self-pay

## 2020-07-27 VITALS — BP 150/90 | HR 86 | Temp 98.3°F | Resp 16

## 2020-07-27 DIAGNOSIS — D751 Secondary polycythemia: Secondary | ICD-10-CM

## 2020-07-27 LAB — HEMOGLOBIN AND HEMATOCRIT, BLOOD
HCT: 51.9 % (ref 39.0–52.0)
Hemoglobin: 17.6 g/dL — ABNORMAL HIGH (ref 13.0–17.0)

## 2020-07-30 ENCOUNTER — Other Ambulatory Visit: Payer: BC Managed Care – PPO

## 2020-08-01 ENCOUNTER — Other Ambulatory Visit: Payer: Self-pay | Admitting: Family Medicine

## 2020-08-01 DIAGNOSIS — E291 Testicular hypofunction: Secondary | ICD-10-CM

## 2020-08-02 ENCOUNTER — Other Ambulatory Visit: Payer: BC Managed Care – PPO

## 2020-08-02 ENCOUNTER — Other Ambulatory Visit: Payer: Self-pay

## 2020-08-02 DIAGNOSIS — E291 Testicular hypofunction: Secondary | ICD-10-CM | POA: Diagnosis not present

## 2020-08-03 LAB — TESTOSTERONE: Testosterone: 134 ng/dL — ABNORMAL LOW (ref 264–916)

## 2020-08-03 LAB — HEMATOCRIT: Hematocrit: 53.1 % — ABNORMAL HIGH (ref 37.5–51.0)

## 2020-08-08 ENCOUNTER — Telehealth: Payer: Self-pay | Admitting: Family Medicine

## 2020-08-08 NOTE — Telephone Encounter (Signed)
-----   Message from Nori Riis, PA-C sent at 08/07/2020  8:43 PM EDT ----- Please let Mr. Sackrider know that his hematocrit remains above normal.  It looks like from the chart, he missed his phlebotomy in August.  I recommend that he keep his scheduled phlebotomy in September and continue his present dose of testosterone.

## 2020-08-08 NOTE — Telephone Encounter (Signed)
Patient notified and voiced understanding.

## 2020-08-16 DIAGNOSIS — G5603 Carpal tunnel syndrome, bilateral upper limbs: Secondary | ICD-10-CM | POA: Diagnosis not present

## 2020-08-22 ENCOUNTER — Inpatient Hospital Stay: Payer: BC Managed Care – PPO | Attending: Oncology

## 2020-08-22 ENCOUNTER — Other Ambulatory Visit: Payer: Self-pay

## 2020-08-22 ENCOUNTER — Inpatient Hospital Stay: Payer: BC Managed Care – PPO

## 2020-08-22 VITALS — BP 129/79 | HR 89 | Temp 96.5°F | Resp 16 | Wt 231.0 lb

## 2020-08-22 DIAGNOSIS — D751 Secondary polycythemia: Secondary | ICD-10-CM

## 2020-08-22 LAB — HEMOGLOBIN AND HEMATOCRIT, BLOOD
HCT: 50.1 % (ref 39.0–52.0)
Hemoglobin: 17.2 g/dL — ABNORMAL HIGH (ref 13.0–17.0)

## 2020-08-22 NOTE — Progress Notes (Signed)
Hct 50.1. Confirmed with Dr. Tasia Catchings, remove 56ml during phlebotomy today. 548ml removed, patient tolerated well. IV d/c intact, VSS on discharge.

## 2020-08-27 DIAGNOSIS — Z23 Encounter for immunization: Secondary | ICD-10-CM | POA: Diagnosis not present

## 2020-09-05 ENCOUNTER — Other Ambulatory Visit: Payer: Self-pay | Admitting: Family Medicine

## 2020-09-05 MED ORDER — EMPAGLIFLOZIN 10 MG PO TABS
10.0000 mg | ORAL_TABLET | Freq: Every day | ORAL | 0 refills | Status: DC
Start: 2020-09-05 — End: 2020-09-28

## 2020-09-05 NOTE — Telephone Encounter (Signed)
PT need a refill  metFORMIN (GLUCOPHAGE-XR) 500 MG 24 hr tablet [[id number]]  lisinopril (ZESTRIL) 40 MG tablet [[id number]]  empagliflozin (JARDIANCE) 10 MG TABS tablet [[id number]]  [id number] [address], [place] AT [facility] [address] [facility]  [place], [place] [address]  Phone:  [phone] Fax:  [phone]

## 2020-09-06 MED ORDER — METFORMIN HCL ER 500 MG PO TB24
ORAL_TABLET | ORAL | 0 refills | Status: DC
Start: 2020-09-06 — End: 2020-11-20

## 2020-09-06 MED ORDER — LISINOPRIL 40 MG PO TABS
ORAL_TABLET | ORAL | 0 refills | Status: DC
Start: 2020-09-06 — End: 2020-09-21

## 2020-09-17 ENCOUNTER — Ambulatory Visit: Payer: BLUE CROSS/BLUE SHIELD | Admitting: Urology

## 2020-09-19 ENCOUNTER — Other Ambulatory Visit: Payer: Self-pay

## 2020-09-19 ENCOUNTER — Encounter: Payer: Self-pay | Admitting: Oncology

## 2020-09-19 ENCOUNTER — Inpatient Hospital Stay: Payer: BC Managed Care – PPO | Attending: Oncology

## 2020-09-19 ENCOUNTER — Inpatient Hospital Stay (HOSPITAL_BASED_OUTPATIENT_CLINIC_OR_DEPARTMENT_OTHER): Payer: BC Managed Care – PPO | Admitting: Oncology

## 2020-09-19 ENCOUNTER — Inpatient Hospital Stay: Payer: BC Managed Care – PPO

## 2020-09-19 VITALS — BP 122/80 | HR 94 | Temp 98.1°F | Resp 20 | Wt 230.2 lb

## 2020-09-19 DIAGNOSIS — D751 Secondary polycythemia: Secondary | ICD-10-CM | POA: Insufficient documentation

## 2020-09-19 DIAGNOSIS — Z800 Family history of malignant neoplasm of digestive organs: Secondary | ICD-10-CM | POA: Diagnosis not present

## 2020-09-19 DIAGNOSIS — Z8249 Family history of ischemic heart disease and other diseases of the circulatory system: Secondary | ICD-10-CM | POA: Diagnosis not present

## 2020-09-19 DIAGNOSIS — Z79899 Other long term (current) drug therapy: Secondary | ICD-10-CM | POA: Insufficient documentation

## 2020-09-19 DIAGNOSIS — Z86718 Personal history of other venous thrombosis and embolism: Secondary | ICD-10-CM | POA: Insufficient documentation

## 2020-09-19 DIAGNOSIS — Z79890 Hormone replacement therapy (postmenopausal): Secondary | ICD-10-CM

## 2020-09-19 DIAGNOSIS — E119 Type 2 diabetes mellitus without complications: Secondary | ICD-10-CM | POA: Insufficient documentation

## 2020-09-19 DIAGNOSIS — Z8042 Family history of malignant neoplasm of prostate: Secondary | ICD-10-CM | POA: Insufficient documentation

## 2020-09-19 DIAGNOSIS — I10 Essential (primary) hypertension: Secondary | ICD-10-CM | POA: Insufficient documentation

## 2020-09-19 LAB — CBC WITH DIFFERENTIAL/PLATELET
Abs Immature Granulocytes: 0.05 10*3/uL (ref 0.00–0.07)
Basophils Absolute: 0.1 10*3/uL (ref 0.0–0.1)
Basophils Relative: 1 %
Eosinophils Absolute: 0.1 10*3/uL (ref 0.0–0.5)
Eosinophils Relative: 2 %
HCT: 51.6 % (ref 39.0–52.0)
Hemoglobin: 17.4 g/dL — ABNORMAL HIGH (ref 13.0–17.0)
Immature Granulocytes: 1 %
Lymphocytes Relative: 18 %
Lymphs Abs: 1.4 10*3/uL (ref 0.7–4.0)
MCH: 27.5 pg (ref 26.0–34.0)
MCHC: 33.7 g/dL (ref 30.0–36.0)
MCV: 81.5 fL (ref 80.0–100.0)
Monocytes Absolute: 0.9 10*3/uL (ref 0.1–1.0)
Monocytes Relative: 11 %
Neutro Abs: 5.6 10*3/uL (ref 1.7–7.7)
Neutrophils Relative %: 67 %
Platelets: 256 10*3/uL (ref 150–400)
RBC: 6.33 MIL/uL — ABNORMAL HIGH (ref 4.22–5.81)
RDW: 13.3 % (ref 11.5–15.5)
WBC: 8.1 10*3/uL (ref 4.0–10.5)
nRBC: 0 % (ref 0.0–0.2)

## 2020-09-19 NOTE — Progress Notes (Signed)
Patient denies new problems/concerns today.   °

## 2020-09-19 NOTE — Progress Notes (Signed)
Hematology/Oncology follow up  note [facility] [phone] [phone]   Patient Care Team: Virginia Crews, MD as PCP - General (Family Medicine)  REFERRING PROVIDER: Virginia Crews, MD  CHIEF COMPLAINTS/REASON FOR VISIT:  follo wup  of polycytosis/erythrocytosis  HISTORY OF PRESENTING ILLNESS:  Jonathan Fields is a 64 y.o. male who was seen in consultation at the request of Brita Romp, Dionne Bucy, MD for evaluation of polycytosis/erythrocytosis Reviewed patient's recent lab work 03/14/2020, hematocrit 55, 09/13/2019 hematocrit 51.8, 07/22/2019, hematocrit 49.1.  Hemoglobin 17.2.   Patient is on testosterone replacement.  Currently Dr. Bernardo Heater hold off treatments due to erythrocytosis. Patient was is Dr. Dennis Bast to hematology oncology for evaluation and management. Associated signs or symptoms: Patient mention about 15 pounds of unintentional weight loss recently during the past few weeks.  His appetite is unchanged.  He feels that he has eaten similar amount of food recently.  Denies any increase of activity level.  Some sweating for the past 2 days.  Context:  Smoking history: Denies. Testosterone supplements: History of blood clots: Previously he has an episode of provoked thrombosis after surgery. Daytime somnolence: Denies Family history of polycythemia: Denies  INTERVAL HISTORY Jonathan Fields is a 64 y.o. 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  Patient tolerates well.  Today he has no new complaints.  Review of Systems  Constitutional: Negative for appetite change, chills, fatigue, fever and unexpected weight change.  HENT:   Negative for hearing loss and voice change.   Eyes: Negative for eye problems and icterus.   Respiratory: Negative for chest tightness, cough and shortness of breath.   Cardiovascular: Negative for chest pain and leg swelling.  Gastrointestinal: Negative for abdominal distention and abdominal pain.  Endocrine: Negative for hot flashes.  Genitourinary: Negative for difficulty urinating, dysuria and frequency.   Musculoskeletal: Negative for arthralgias.  Skin: Negative for itching and rash.  Neurological: Negative for light-headedness and numbness.  Hematological: Negative for adenopathy. Does not bruise/bleed easily.  Psychiatric/Behavioral: Negative for confusion.    MEDICAL HISTORY:  Past Medical History:  Diagnosis Date  . BPH (benign prostatic hyperplasia)   . Bronchitis   . Complication of anesthesia    hard to wake up  . DM (diabetes mellitus), type 2 ([place])   . DVT (deep venous thrombosis) ([place])    post op  . ED (erectile dysfunction)   . Erythrocytosis 07/04/2020  . GERD (gastroesophageal reflux disease)   . Hyperlipemia   . Hypertension   . Hypogonadism in male   . Nocturia   . Right ankle instability   . Sleep apnea   . TB lung, latent   . Urinary frequency     SURGICAL HISTORY: Past Surgical History:  Procedure Laterality Date  . ANKLE SURGERY Left    torn ligament  . CARPAL TUNNEL RELEASE Right 08/2019  . COLONOSCOPY  2009  . KNEE ARTHROSCOPY Left   . right ankle ruptured tendon surgery  2008  . TRANSURETHRAL RESECTION OF PROSTATE N/A 02/19/2018   Procedure: TRANSURETHRAL RESECTION OF THE PROSTATE (TURP);  Surgeon: [name], MD;  Location: [facility] ORS;  Service: Urology;  Laterality: N/A;    SOCIAL HISTORY: Social History   Socioeconomic History  . Marital status: Married    Spouse name: Not on  file  . Number of children: 0  . Years of education: Not on file  . Highest education level: Not on file  Occupational History  . Not on file  Tobacco Use  . Smoking status: Never Smoker  . Smokeless tobacco: Current User    Types: Snuff   . Tobacco comment: tobaco pouches that produce juice  Vaping Use  . Vaping Use: Never used  Substance and Sexual Activity  . Alcohol use: Yes    Comment: socially, nothing every week  . Drug use: No  . Sexual activity: Yes    Partners: Female    Birth control/protection: None  Other Topics Concern  . Not on file  Social History Narrative  . Not on file   Social Determinants of Health   Financial Resource Strain:   . Difficulty of Paying Living Expenses: Not on file  Food Insecurity:   . Worried About Charity fundraiser in the Last Year: Not on file  . Ran Out of Food in the Last Year: Not on file  Transportation Needs:   . Lack of Transportation (Medical): Not on file  . Lack of Transportation (Non-Medical): Not on file  Physical Activity:   . Days of Exercise per Week: Not on file  . Minutes of Exercise per Session: Not on file  Stress:   . Feeling of Stress : Not on file  Social Connections:   . Frequency of Communication with Friends and Family: Not on file  . Frequency of Social Gatherings with Friends and Family: Not on file  . Attends Religious Services: Not on file  . Active Member of Clubs or Organizations: Not on file  . Attends Archivist Meetings: Not on file  . Marital Status: Not on file  Intimate Partner Violence:   . Fear of Current or Ex-Partner: Not on file  . Emotionally Abused: Not on file  . Physically Abused: Not on file  . Sexually Abused: Not on file    FAMILY HISTORY: Family History  Problem Relation Age of Onset  . Heart disease Father   . Hypertension Father   . Prostate cancer Father   . Heart disease Mother   . Breast cancer Mother   . Colon cancer Neg Hx     ALLERGIES:  is allergic to penicillins.  MEDICATIONS:  Current Outpatient Medications  Medication Sig Dispense Refill  . atorvastatin (LIPITOR) 40 MG tablet Take 1 tablet (40 mg total) by mouth daily. 90 tablet 3  . empagliflozin (JARDIANCE) 10 MG TABS tablet  Take 1 tablet (10 mg total) by mouth daily before breakfast. 90 tablet 0  . finasteride (PROSCAR) 5 MG tablet Take 5 mg by mouth daily.    . fluticasone (FLONASE) 50 MCG/ACT nasal spray instill 1 to 2 sprays into each nostril once daily  0  . glipiZIDE (GLUCOTROL XL) 5 MG 24 hr tablet TK 1 T PO QD    . hydrocortisone (ANUSOL-HC) 2.5 % rectal cream Place 1 application rectally 2 (two) times daily. 30 g 0  . lisinopril (ZESTRIL) 40 MG tablet Take one tablet daily 90 tablet 0  . metFORMIN (GLUCOPHAGE-XR) 500 MG 24 hr tablet Take 4 tablets every morning 360 tablet 0  . montelukast (SINGULAIR) 10 MG tablet Take 1 tablet (10 mg total) by mouth daily as needed. 90 tablet 1  . NEEDLE, DISP, 21 G (BD SAFETYGLIDE SHIELDED NEEDLE) 21G X 1-1/2" MISC Use 21guage needle to inject Testosterone Cypionate 30ml. 25 each 0  .  SYRINGE-NEEDLE, DISP, 3 ML 18G X 1-1/2" 3 ML MISC Draw up 44ml Testosterone Cypionate with 18 gauge needle. 25 each 0  . tamsulosin (FLOMAX) 0.4 MG CAPS capsule Take 0.4 mg by mouth daily.    Marland Kitchen testosterone cypionate (DEPOTESTOSTERONE CYPIONATE) 200 MG/ML injection Inject 0.5 mLs (100 mg total) into the muscle once a week. 10 mL 0  . Azelastine-Fluticasone 137-50 MCG/ACT SUSP One spray each nostril twice daily. (Patient not taking: Reported on 09/19/2020) 23 g 1  . omeprazole (PRILOSEC) 20 MG capsule Take 20 mg by mouth daily. (Patient not taking: Reported on 09/19/2020)     No current facility-administered medications for this visit.     PHYSICAL EXAMINATION: ECOG PERFORMANCE STATUS: 0 - Asymptomatic Vitals:   09/19/20 1313  BP: 122/80  Pulse: 94  Resp: 20  Temp: 98.1 F (36.7 C)   Filed Weights   09/19/20 1313  Weight: 230 lb 3.2 oz (104.4 kg)    Physical Exam Constitutional:      General: He is not in acute distress. HENT:     Head: Normocephalic and atraumatic.  Eyes:     General: No scleral icterus. Cardiovascular:     Rate and Rhythm: Normal rate and regular rhythm.      Heart sounds: Normal heart sounds.  Pulmonary:     Effort: Pulmonary effort is normal. No respiratory distress.     Breath sounds: No wheezing.  Abdominal:     General: Bowel sounds are normal. There is no distension.     Palpations: Abdomen is soft.  Musculoskeletal:        General: No deformity. Normal range of motion.     Cervical back: Normal range of motion and neck supple.  Skin:    General: Skin is warm and dry.     Findings: No erythema or rash.  Neurological:     Mental Status: He is alert and oriented to person, place, and time. Mental status is at baseline.     Cranial Nerves: No cranial nerve deficit.     Coordination: Coordination normal.  Psychiatric:        Mood and Affect: Mood normal.     RADIOGRAPHIC STUDIES: I have personally reviewed the radiological images as listed and agreed with the findings in the report. No results found.   LABORATORY DATA:  I have reviewed the data as listed Lab Results  Component Value Date   WBC 8.1 09/19/2020   HGB 17.4 (H) 09/19/2020   HCT 51.6 09/19/2020   MCV 81.5 09/19/2020   PLT 256 09/19/2020   Recent Labs    03/28/20 1050 04/13/20 0818  NA 134* 137  K 4.6 4.7  CL 98 99  CO2 25 20  GLUCOSE 429* 225*  BUN 19 19  CREATININE 1.19 0.89  CALCIUM 8.9 9.5  GFRNONAA >60 90  GFRAA >60 104  PROT 7.1 6.6  ALBUMIN 3.6 4.2  AST 23 18  ALT 53* 29  ALKPHOS 68 84  BILITOT 1.2 1.1   Iron/TIBC/Ferritin/ %Sat No results found for: IRON, TIBC, FERRITIN, IRONPCTSAT      ASSESSMENT & PLAN:  1. Erythrocytosis   2. Long-term current use of testosterone replacement therapy    #Secondary erythrocytosis due to chronic testosterone use.JAK2 V617F mutation negative, with reflex to other mutations CALR, MPL, JAK 2 Ex 12-15 mutations negative. Check routine BCR ABL FISH Labs are reviewed and discussed with patient. CBC was available after patient left the clinic.  Hematocrit above 50.  Will arrange  patient to proceed  with phlebotomy 500 cc x 1. Continue H&H in 6 weeks +/- phlebotomy Follow-up in 3 months for lab work and evaluation. Orders Placed This Encounter  Procedures  . Ferritin    Standing Status:   Future    Standing Expiration Date:   03/20/2021  . Iron and TIBC    Standing Status:   Future    Standing Expiration Date:   09/19/2021  . CBC with Differential/Platelet    Standing Status:   Future    Standing Expiration Date:   09/19/2021    We spent sufficient time to discuss many aspect of care, questions were answered to patient's satisfaction. The patient knows to call the clinic with any problems questions or concerns.  Earlie Server, MD, PhD 09/19/2020

## 2020-09-21 ENCOUNTER — Other Ambulatory Visit: Payer: Self-pay | Admitting: Family Medicine

## 2020-09-24 ENCOUNTER — Other Ambulatory Visit: Payer: Self-pay | Admitting: Physician Assistant

## 2020-09-24 DIAGNOSIS — R0981 Nasal congestion: Secondary | ICD-10-CM

## 2020-09-28 ENCOUNTER — Inpatient Hospital Stay: Payer: BC Managed Care – PPO

## 2020-09-28 ENCOUNTER — Encounter: Payer: Self-pay | Admitting: Family Medicine

## 2020-09-28 ENCOUNTER — Other Ambulatory Visit: Payer: Self-pay

## 2020-09-28 ENCOUNTER — Ambulatory Visit: Payer: BC Managed Care – PPO | Admitting: Family Medicine

## 2020-09-28 VITALS — BP 147/79 | HR 86 | Temp 98.0°F | Wt 233.0 lb

## 2020-09-28 VITALS — BP 126/80 | HR 96 | Temp 97.6°F

## 2020-09-28 DIAGNOSIS — I152 Hypertension secondary to endocrine disorders: Secondary | ICD-10-CM

## 2020-09-28 DIAGNOSIS — G8929 Other chronic pain: Secondary | ICD-10-CM

## 2020-09-28 DIAGNOSIS — M25512 Pain in left shoulder: Secondary | ICD-10-CM

## 2020-09-28 DIAGNOSIS — M12812 Other specific arthropathies, not elsewhere classified, left shoulder: Secondary | ICD-10-CM | POA: Diagnosis not present

## 2020-09-28 DIAGNOSIS — Z79899 Other long term (current) drug therapy: Secondary | ICD-10-CM | POA: Diagnosis not present

## 2020-09-28 DIAGNOSIS — D751 Secondary polycythemia: Secondary | ICD-10-CM | POA: Diagnosis not present

## 2020-09-28 DIAGNOSIS — E1169 Type 2 diabetes mellitus with other specified complication: Secondary | ICD-10-CM

## 2020-09-28 DIAGNOSIS — Z86718 Personal history of other venous thrombosis and embolism: Secondary | ICD-10-CM | POA: Diagnosis not present

## 2020-09-28 DIAGNOSIS — I10 Essential (primary) hypertension: Secondary | ICD-10-CM | POA: Diagnosis not present

## 2020-09-28 DIAGNOSIS — Z8249 Family history of ischemic heart disease and other diseases of the circulatory system: Secondary | ICD-10-CM | POA: Diagnosis not present

## 2020-09-28 DIAGNOSIS — E1159 Type 2 diabetes mellitus with other circulatory complications: Secondary | ICD-10-CM | POA: Diagnosis not present

## 2020-09-28 DIAGNOSIS — Z23 Encounter for immunization: Secondary | ICD-10-CM | POA: Diagnosis not present

## 2020-09-28 DIAGNOSIS — Z8042 Family history of malignant neoplasm of prostate: Secondary | ICD-10-CM | POA: Diagnosis not present

## 2020-09-28 DIAGNOSIS — E785 Hyperlipidemia, unspecified: Secondary | ICD-10-CM | POA: Diagnosis not present

## 2020-09-28 DIAGNOSIS — E119 Type 2 diabetes mellitus without complications: Secondary | ICD-10-CM | POA: Diagnosis not present

## 2020-09-28 DIAGNOSIS — Z800 Family history of malignant neoplasm of digestive organs: Secondary | ICD-10-CM | POA: Diagnosis not present

## 2020-09-28 LAB — POCT GLYCOSYLATED HEMOGLOBIN (HGB A1C): Hemoglobin A1C: 6.8 % — AB (ref 4.0–5.6)

## 2020-09-28 MED ORDER — EMPAGLIFLOZIN 25 MG PO TABS
25.0000 mg | ORAL_TABLET | Freq: Every day | ORAL | 3 refills | Status: DC
Start: 2020-09-28 — End: 2021-10-31

## 2020-09-28 MED ORDER — HYDROCHLOROTHIAZIDE 12.5 MG PO TABS: 12.5000 mg | ORAL_TABLET | Freq: Every day | ORAL | 3 refills | Status: DC

## 2020-09-28 NOTE — Assessment & Plan Note (Signed)
Uncontrolled Consistently elevated on home readings as well Continue lisinopril 40 mg daily Add HCTZ 12.5 mg daily Recheck metabolic panel Discussed importance of good blood pressure control Follow-up in 3 months

## 2020-09-28 NOTE — Assessment & Plan Note (Signed)
Well-controlled Associated with HLD and HTN He has stopped taking Metformin as he thought he should not be on so many medications He is not currently taking a statin as above Resume Metformin Increase Jardiance to 25 mg daily Stop glipizide Up-to-date on eye exam and foot exam On ACE inhibitor Discussed diet and exercise Follow-up in 3 months and repeat A1c

## 2020-09-28 NOTE — Progress Notes (Signed)
Per Dr.Yu & RN, ok to proceed with phlebotomy today. 500 cc removed successful. Patient tolerated well. Stable. No questions at this time. Discharged.

## 2020-09-28 NOTE — Patient Instructions (Addendum)
Take atorvastatin  Take metformin and jardiance 25mg  daily Stop glipizide   Joint Steroid Injection A joint steroid injection is a procedure to relieve swelling and pain in a joint. Steroids are medicines that reduce inflammation. In this procedure, your health care provider uses a syringe and a needle to inject a steroid medicine into a painful and inflamed joint. A pain-relieving medicine (anesthetic) may be injected along with the steroid. In some cases, your health care provider may use an imaging technique such as ultrasound or fluoroscopy to guide the injection. Joints that are often treated with steroid injections include the knee, shoulder, hip, and spine. These injections may also be used in the elbow, ankle, and joints of the hands or feet. You may have joint steroid injections as part of your treatment for inflammation caused by:  Gout.  Rheumatoid arthritis.  Advanced wear-and-tear arthritis (osteoarthritis).  Tendinitis.  Bursitis. Joint steroid injections may be repeated, but having them too often can damage a joint or the skin over the joint. You should not have joint steroid injections less than 6 weeks apart or more than four times a year. Tell a health care provider about:  Any allergies you have.  All medicines you are taking, including vitamins, herbs, eye drops, creams, and over-the-counter medicines.  Any problems you or family members have had with anesthetic medicines.  Any blood disorders you have.  Any surgeries you have had.  Any medical conditions you have.  Whether you are pregnant or may be pregnant. What are the risks? Generally, this is a safe treatment. However, problems may occur, including:  Infection.  Bleeding.  Allergic reactions to medicines.  Damage to the joint or tissues around the joint.  Thinning of skin or loss of skin color over the joint.  Temporary flushing of the face or chest.  Temporary increase in pain.  Temporary  increase in blood sugar.  Failure to relieve inflammation or pain. What happens before the treatment?  You may have imaging tests of your joint.  Ask your health care provider about: ? Changing or stopping your regular medicines. This is especially important if you are taking diabetes medicines or blood thinners. ? Taking medicines such as aspirin and ibuprofen. These medicines can thin your blood. Do not take these medicines unless your health care provider tells you to take them. ? Taking over-the-counter medicines, vitamins, herbs, and supplements.  Ask your health care provider if you can drive yourself home after the procedure. What happens during the treatment?   Your health care provider will position you for the injection and locate the injection site over your joint.  The skin over the joint will be cleaned with a germ-killing soap.  Your health care provider may: ? Spray a numbing solution (topical anesthetic) over the injection site. ? Inject a local anesthetic under the skin above your joint.  The needle will be placed through your skin into your joint. Your health care provider may use imaging to guide the needle to the right spot for the injection. If imaging is used, a special contrast dye may be injected to confirm that the needle is in the correct location.  The steroid medicine will be injected into your joint.  Anesthetic may be injected along with the steroid. This may be a medicine that relieves pain for a short time (short-acting anesthetic) or for a longer time (long-acting anesthetic).  The needle will be removed, and an adhesive bandage (dressing) will be placed over the injection site.  The procedure may vary among health care providers and hospitals. What can I expect after the treatment?  You will be able to go home after the treatment.  It is normal to feel slight flushing for a few days after the injection.  After the treatment, it is common to have  an increase in joint pain after the anesthetic has worn off. This may happen about an hour after a short-acting anesthetic or about 8 hours after a longer-acting anesthetic.  You should begin to feel relief from joint pain and swelling after 24 to 48 hours. Follow these instructions at home: Injection site care  Leave the adhesive dressing over your injection site in place until your health care provider says you can remove it.  Check your injection site every day for signs of infection. Check for: ? Redness, swelling, or pain. ? Fluid or blood. ? Warmth. ? Pus or a bad smell. Activity  Return to your normal activities as told by your health care provider. Ask your health care provider what activities are safe for you. You may be asked to limit activities that put stress on the joint for a few days.  Do joint exercises as told by your health care provider.  Do not take baths, swim, or use a hot tub until your health care provider approves. Managing pain, stiffness, and swelling   If directed, put ice on the joint. ? Put ice in a plastic bag. ? Place a towel between your skin and the bag. ? Leave the ice on for 20 minutes, 2-3 times a day.  Raise (elevate) your joint above the level of your heart when you are sitting or lying down. General instructions  Take over-the-counter and prescription medicines only as told by your health care provider.  Do not use any products that contain nicotine or tobacco, such as cigarettes, e-cigarettes, and chewing tobacco. These can delay joint healing. If you need help quitting, ask your health care provider.  If you have diabetes, be aware that your blood sugar may be slightly elevated for several days after the injection.  Keep all follow-up visits as told by your health care provider. This is important. Contact a health care provider if you have:  Chills or a fever.  Any signs of infection at your injection site.  Increased pain or  swelling or no relief after 2 days. Summary  A joint steroid injection is a treatment to relieve pain and swelling in a joint.  Steroids are medicines that reduce inflammation. Your health care provider may add an anesthetic along with the steroid.  You may have joint steroid injections as part of your arthritis treatment.  Joint steroid injections may be repeated, but having them too often can damage a joint or the skin over the joint.  Contact your health care provider if you have a fever, chills, or signs of infection or if you get no relief from joint pain or swelling. This information is not intended to replace advice given to you by your health care provider. Make sure you discuss any questions you have with your health care provider. Document Revised: 07/20/2018 Document Reviewed: 07/20/2018 Elsevier Patient Education  2020 [employer].

## 2020-09-28 NOTE — Assessment & Plan Note (Signed)
Elevated when last checked He was prescribed atorvastatin but has not started this Discussed the benefits of statin therapy for reducing ASCVD risk Patient agrees to start atorvastatin as prescribed Check FLP and CMP Goal LDL less than 70

## 2020-09-28 NOTE — Assessment & Plan Note (Signed)
Patient with known chronic rotator cuff arthropathy of bilateral shoulders Left shoulder is more limited and painful today He has previously done well with corticosteroid injections He wants to avoid surgery if at all possible Discussed home exercise program Corticosteroid injection given today shoulder See procedure note below

## 2020-09-28 NOTE — Progress Notes (Signed)
Established patient visit   Patient: Jonathan Fields   DOB: 05-Jul-1956   64 y.o. Male  MRN: [id number] Visit Date: 09/28/2020  Today's healthcare provider: Lavon Paganini, MD   Chief Complaint  Patient presents with  . Diabetes  . Hyperlipidemia  . Hypertension   Subjective    HPI   Diabetes Mellitus Type II, follow-up  Lab Results  Component Value Date   HGBA1C 6.8 (A) 09/28/2020   HGBA1C 8.0 (H) 06/26/2020   HGBA1C 9.1 (A) 06/05/2020   Last seen for diabetes 3 months ago.  Management since then includes continuing the same treatment. He reports excellent compliance with treatment. He is not having side effects.   Home blood sugar records: are not being checked  Episodes of hypoglycemia? No    Current insulin regiment: no Most Recent Eye Exam: 06/01/2020  --------------------------------------------------------------------------------------------------- Hypertension, follow-up  BP Readings from Last 3 Encounters:  09/28/20 126/80  09/28/20 (!) 147/79  09/19/20 122/80   Wt Readings from Last 3 Encounters:  09/28/20 233 lb (105.7 kg)  09/19/20 230 lb 3.2 oz (104.4 kg)  08/22/20 231 lb (104.8 kg)     He was last seen for hypertension 3 months ago.  He reports excellent compliance with treatment. He is not having side effects.  He is exercising. He is adherent to low salt diet.   Outside blood pressures are not being checked.  He does not smoke.  Use of agents associated with hypertension: none.   --------------------------------------------------------------------------------------------------- Lipid/Cholesterol, follow-up  Last Lipid Panel: Lab Results  Component Value Date   CHOL 229 (H) 04/13/2020   LDLCALC 145 (H) 04/13/2020   HDL 42 04/13/2020   TRIG 232 (H) 04/13/2020    He was last seen for this 3 months ago.  Management since that visit includes no changes.  He reports excellent compliance with treatment. He is not having  side effects.   Symptoms: No appetite changes No foot ulcerations  No chest pain No chest pressure/discomfort  No dyspnea No orthopnea  No fatigue No lower extremity edema  No palpitations No paroxysmal nocturnal dyspnea  No nausea No numbness or tingling of extremity  No polydipsia No polyuria  No speech difficulty No syncope   He is following a Regular diet.   Last metabolic panel Lab Results  Component Value Date   GLUCOSE 225 (H) 04/13/2020   NA 137 04/13/2020   K 4.7 04/13/2020   BUN 19 04/13/2020   CREATININE 0.89 04/13/2020   GFRNONAA 90 04/13/2020   GFRAA 104 04/13/2020   CALCIUM 9.5 04/13/2020   AST 18 04/13/2020   ALT 29 04/13/2020   The 10-year ASCVD risk score Mikey Bussing DC Jr., et al., 2013) is: 30.8%  --------------------------------------------------------------------------------------------------- History of bilateral shoulder bursitis and rotator cuff arthropathy. L>R today. Helped with cortisone shot in the past.    Patient Active Problem List   Diagnosis Date Noted  . Rotator cuff arthropathy of left shoulder 09/28/2020  . Erythrocytosis 07/04/2020  . Hyperlipidemia associated with type 2 diabetes mellitus ([place]) 04/09/2020  . Hypogonadism in male 02/03/2020  . Obesity 09/22/2019  . Hypertension associated with diabetes ([place]) 09/22/2019  . T2DM (type 2 diabetes mellitus) ([place]) 09/22/2019  . Gastroesophageal reflux disease without esophagitis 03/22/2019  . Sleep apnea 03/22/2019  . Post-traumatic osteoarthritis of right ankle 02/15/2019  . Cavovarus deformity of foot, acquired, right 02/14/2019  . Peroneal tendinitis of lower leg, right 02/14/2019  . Acquired right hindfoot varus 11/17/2018  . BPH (benign  prostatic hyperplasia) 02/19/2018  . Varicose veins of both lower extremities with pain 04/09/2017  . Chronic venous insufficiency 04/09/2017  . Right ankle instability 02/21/2013   Past Medical History:  Diagnosis Date  . BPH (benign prostatic  hyperplasia)   . Bronchitis   . Complication of anesthesia    hard to wake up  . DM (diabetes mellitus), type 2 ([place])   . DVT (deep venous thrombosis) ([place])    post op  . ED (erectile dysfunction)   . Erythrocytosis 07/04/2020  . GERD (gastroesophageal reflux disease)   . Hyperlipemia   . Hypertension   . Hypogonadism in male   . Nocturia   . Right ankle instability   . Sleep apnea   . TB lung, latent   . Urinary frequency    Social History   Tobacco Use  . Smoking status: Never Smoker  . Smokeless tobacco: Current User    Types: Snuff  . Tobacco comment: tobaco pouches that produce juice  Vaping Use  . Vaping Use: Never used  Substance Use Topics  . Alcohol use: Yes    Comment: socially, nothing every week  . Drug use: No   Allergies  Allergen Reactions  . Penicillins Hives    Has patient had a PCN reaction causing immediate rash, facial/tongue/throat swelling, SOB or lightheadedness with hypotension: yes Has patient had a PCN reaction causing severe rash involving mucus membranes or skin necrosis: no Has patient had a PCN reaction that required hospitalization: no Has patient had a PCN reaction occurring within the last 10 years: no If all of the above answers are "NO", then may proceed with Cephalosporin use.      Medications: Outpatient Medications Prior to Visit  Medication Sig  . atorvastatin (LIPITOR) 40 MG tablet Take 1 tablet (40 mg total) by mouth daily.  . finasteride (PROSCAR) 5 MG tablet Take 5 mg by mouth daily.  . fluticasone (FLONASE) 50 MCG/ACT nasal spray instill 1 to 2 sprays into each nostril once daily  . lisinopril (ZESTRIL) 40 MG tablet TAKE 1 TABLET BY MOUTH DAILY  . metFORMIN (GLUCOPHAGE-XR) 500 MG 24 hr tablet Take 4 tablets every morning  . NEEDLE, DISP, 21 G (BD SAFETYGLIDE SHIELDED NEEDLE) 21G X 1-1/2" MISC Use 21guage needle to inject Testosterone Cypionate 41ml.  Marland Kitchen omeprazole (PRILOSEC) 20 MG capsule Take 20 mg by mouth daily.   .  SYRINGE-NEEDLE, DISP, 3 ML 18G X 1-1/2" 3 ML MISC Draw up 43ml Testosterone Cypionate with 18 gauge needle.  . tamsulosin (FLOMAX) 0.4 MG CAPS capsule Take 0.4 mg by mouth daily.  Marland Kitchen testosterone cypionate (DEPOTESTOSTERONE CYPIONATE) 200 MG/ML injection Inject 0.5 mLs (100 mg total) into the muscle once a week.  . [DISCONTINUED] empagliflozin (JARDIANCE) 10 MG TABS tablet Take 1 tablet (10 mg total) by mouth daily before breakfast.  . [DISCONTINUED] glipiZIDE (GLUCOTROL XL) 5 MG 24 hr tablet TK 1 T PO QD  . [DISCONTINUED] Azelastine-Fluticasone 137-50 MCG/ACT SUSP One spray each nostril twice daily. (Patient not taking: Reported on 09/19/2020)  . [DISCONTINUED] hydrocortisone (ANUSOL-HC) 2.5 % rectal cream Place 1 application rectally 2 (two) times daily.  . [DISCONTINUED] montelukast (SINGULAIR) 10 MG tablet TAKE 1 TABLET(10 MG) BY MOUTH DAILY AS NEEDED   No facility-administered medications prior to visit.    Review of Systems  Constitutional: Negative.   Respiratory: Negative.   Cardiovascular: Negative.   Musculoskeletal: Positive for arthralgias. Negative for joint swelling and myalgias.  Neurological: Negative.  Objective    BP (!) 147/79 (BP Location: Left Arm, Patient Position: Sitting, Cuff Size: Large)   Pulse 86   Temp 98 F (36.7 C) (Oral)   Wt 233 lb (105.7 kg)   BMI 29.92 kg/m    Physical Exam Vitals reviewed.  Constitutional:      General: He is not in acute distress.    Appearance: Normal appearance. He is not diaphoretic.  HENT:     Head: Normocephalic and atraumatic.  Eyes:     General: No scleral icterus.    Conjunctiva/sclera: Conjunctivae normal.  Cardiovascular:     Rate and Rhythm: Normal rate and regular rhythm.     Pulses: Normal pulses.     Heart sounds: Normal heart sounds. No murmur heard.   Pulmonary:     Effort: Pulmonary effort is normal. No respiratory distress.     Breath sounds: Normal breath sounds. No wheezing or rhonchi.    Abdominal:     General: There is no distension.     Palpations: Abdomen is soft.     Tenderness: There is no abdominal tenderness.  Musculoskeletal:     Left shoulder: Tenderness present. No swelling, deformity or effusion. Decreased range of motion (abduction and flexion). Decreased strength (external rotation).     Cervical back: Neck supple.     Right lower leg: No edema.     Left lower leg: No edema.  Lymphadenopathy:     Cervical: No cervical adenopathy.  Skin:    General: Skin is warm and dry.     Capillary Refill: Capillary refill takes less than 2 seconds.     Findings: No rash.  Neurological:     Mental Status: He is alert and oriented to person, place, and time.     Cranial Nerves: No cranial nerve deficit.  Psychiatric:        Mood and Affect: Mood normal.        Behavior: Behavior normal.       Results for orders placed or performed in visit on 09/28/20  POCT glycosylated hemoglobin (Hb A1C)  Result Value Ref Range   Hemoglobin A1C 6.8 (A) 4.0 - 5.6 %    Assessment & Plan     Problem List Items Addressed This Visit      Cardiovascular and Mediastinum   Hypertension associated with diabetes ([place]) - Primary    Uncontrolled Consistently elevated on home readings as well Continue lisinopril 40 mg daily Add HCTZ 12.5 mg daily Recheck metabolic panel Discussed importance of good blood pressure control Follow-up in 3 months      Relevant Medications   empagliflozin (JARDIANCE) 25 MG TABS tablet   hydrochlorothiazide (HYDRODIURIL) 12.5 MG tablet   Other Relevant Orders   Comprehensive metabolic panel     Endocrine   T2DM (type 2 diabetes mellitus) ([place])    Well-controlled Associated with HLD and HTN He has stopped taking Metformin as he thought he should not be on so many medications He is not currently taking a statin as above Resume Metformin Increase Jardiance to 25 mg daily Stop glipizide Up-to-date on eye exam and foot exam On ACE  inhibitor Discussed diet and exercise Follow-up in 3 months and repeat A1c      Relevant Medications   empagliflozin (JARDIANCE) 25 MG TABS tablet   Other Relevant Orders   POCT glycosylated hemoglobin (Hb A1C) (Completed)   Hyperlipidemia associated with type 2 diabetes mellitus ([facility])    Elevated when last checked He was prescribed atorvastatin  but has not started this Discussed the benefits of statin therapy for reducing ASCVD risk Patient agrees to start atorvastatin as prescribed Check FLP and CMP Goal LDL less than 70      Relevant Medications   empagliflozin (JARDIANCE) 25 MG TABS tablet   Other Relevant Orders   Comprehensive metabolic panel   Lipid panel     Musculoskeletal and Integument   Rotator cuff arthropathy of left shoulder    Patient with known chronic rotator cuff arthropathy of bilateral shoulders Left shoulder is more limited and painful today He has previously done well with corticosteroid injections He wants to avoid surgery if at all possible Discussed home exercise program Corticosteroid injection given today shoulder See procedure note below       Other Visit Diagnoses    Chronic left shoulder pain            INJECTION: Patient was given informed consent,. Appropriate time out was taken. Area prepped and draped in usual sterile fashion. 2 cc of depo-medrol 40 mg/ml plus  4 cc of 1% lidocaine without epinephrine was injected into the L shoulder subacromial space using a(n) posterior approach. The patient tolerated the procedure well. There were no complications. Post procedure instructions were given.    Return in about 3 months (around 12/29/2020) for chronic disease f/u.      I, Lavon Paganini, MD, have reviewed all documentation for this visit. The documentation on 09/28/20 for the exam, diagnosis, procedures, and orders are all accurate and complete.   Zelma Snead, Dionne Bucy, MD, MPH [facility]

## 2020-09-29 LAB — LIPID PANEL
Chol/HDL Ratio: 6.7 ratio — ABNORMAL HIGH (ref 0.0–5.0)
Cholesterol, Total: 214 mg/dL — ABNORMAL HIGH (ref 100–199)
HDL: 32 mg/dL — ABNORMAL LOW (ref >39)
LDL Chol Calc (NIH): 129 mg/dL — ABNORMAL HIGH (ref 0–99)
Triglycerides: 297 mg/dL — ABNORMAL HIGH (ref 0–149)
VLDL Cholesterol Cal: 53 mg/dL — ABNORMAL HIGH (ref 5–40)

## 2020-09-29 LAB — COMPREHENSIVE METABOLIC PANEL
ALT: 25 IU/L (ref 0–44)
AST: 19 IU/L (ref 0–40)
Albumin/Globulin Ratio: 2 (ref 1.2–2.2)
Albumin: 4.3 g/dL (ref 3.8–4.8)
Alkaline Phosphatase: 80 IU/L (ref 44–121)
BUN/Creatinine Ratio: 16 (ref 10–24)
BUN: 16 mg/dL (ref 8–27)
Bilirubin Total: 1 mg/dL (ref 0.0–1.2)
CO2: 17 mmol/L — ABNORMAL LOW (ref 20–29)
Calcium: 9 mg/dL (ref 8.6–10.2)
Chloride: 101 mmol/L (ref 96–106)
Creatinine, Ser: 1.03 mg/dL (ref 0.76–1.27)
GFR calc Af Amer: 88 mL/min/{1.73_m2} (ref >59)
GFR calc non Af Amer: 76 mL/min/{1.73_m2} (ref >59)
Globulin, Total: 2.1 g/dL (ref 1.5–4.5)
Glucose: 228 mg/dL — ABNORMAL HIGH (ref 65–99)
Potassium: 4.1 mmol/L (ref 3.5–5.2)
Sodium: 137 mmol/L (ref 134–144)
Total Protein: 6.4 g/dL (ref 6.0–8.5)

## 2020-10-01 ENCOUNTER — Telehealth: Payer: Self-pay

## 2020-10-01 NOTE — Telephone Encounter (Signed)
-----   Message from Virginia Crews, MD sent at 10/01/2020  1:04 PM EDT ----- Normal kidney function, liver function, electrolytes.  Cholesterol remains above goal.  LDL, bad cholesterol, should be less than 70 in the setting of diabetes.  If patient is taking atorvastatin 40 mg daily regularly with good compliance, we can increase it to 80 mg daily.  If he is not taking it regularly, he needs to resume.

## 2020-10-01 NOTE — Telephone Encounter (Signed)
FYI...   Pt advised.  He has not been taking atorvastatin, but will start back today.   Thanks,   -Mickel Baas

## 2020-10-01 NOTE — Addendum Note (Signed)
Addended by: Ashley Royalty E on: 10/01/2020 04:33 PM   Modules accepted: Orders

## 2020-10-31 ENCOUNTER — Inpatient Hospital Stay: Payer: BC Managed Care – PPO

## 2020-10-31 ENCOUNTER — Telehealth: Payer: Self-pay | Admitting: Urology

## 2020-10-31 ENCOUNTER — Other Ambulatory Visit: Payer: Self-pay | Admitting: *Deleted

## 2020-10-31 ENCOUNTER — Inpatient Hospital Stay: Payer: BC Managed Care – PPO | Attending: Oncology

## 2020-10-31 VITALS — BP 126/81 | HR 100 | Temp 98.1°F

## 2020-10-31 DIAGNOSIS — D751 Secondary polycythemia: Secondary | ICD-10-CM

## 2020-10-31 DIAGNOSIS — Z86718 Personal history of other venous thrombosis and embolism: Secondary | ICD-10-CM | POA: Insufficient documentation

## 2020-10-31 DIAGNOSIS — Z79899 Other long term (current) drug therapy: Secondary | ICD-10-CM | POA: Insufficient documentation

## 2020-10-31 LAB — HEMOGLOBIN AND HEMATOCRIT, BLOOD
HCT: 52.1 % — ABNORMAL HIGH (ref 39.0–52.0)
Hemoglobin: 16.8 g/dL (ref 13.0–17.0)

## 2020-10-31 NOTE — Progress Notes (Signed)
Pt tolerated phlebotomy well with no signs of complications. VSS. Pt stable for discharge.   Jonathan Fields [employer]

## 2020-10-31 NOTE — Telephone Encounter (Signed)
Patient called the office today requesting a refill for his prescription of tamsulosin to be sent to the [facility] in [place].  Please call the patient if there is an issue.

## 2020-10-31 NOTE — Telephone Encounter (Signed)
Talked with patient about Tamsulosin . Patient states he stop taking them and now wants to start them back

## 2020-11-01 MED ORDER — TAMSULOSIN HCL 0.4 MG PO CAPS
0.4000 mg | ORAL_CAPSULE | Freq: Every day | ORAL | 3 refills | Status: DC
Start: 2020-11-01 — End: 2020-12-31

## 2020-11-01 NOTE — Telephone Encounter (Signed)
Okay to restart tamsulosin

## 2020-11-05 LAB — BCR-ABL1 FISH
Cells Analyzed: 200
Cells Counted: 200

## 2020-11-20 ENCOUNTER — Other Ambulatory Visit: Payer: Self-pay | Admitting: Family Medicine

## 2020-11-27 ENCOUNTER — Other Ambulatory Visit: Payer: Self-pay | Admitting: Urology

## 2020-11-29 ENCOUNTER — Other Ambulatory Visit: Payer: Self-pay | Admitting: Physician Assistant

## 2020-12-06 DIAGNOSIS — H02403 Unspecified ptosis of bilateral eyelids: Secondary | ICD-10-CM | POA: Diagnosis not present

## 2020-12-12 ENCOUNTER — Other Ambulatory Visit: Payer: BC Managed Care – PPO

## 2020-12-15 DIAGNOSIS — Z20822 Contact with and (suspected) exposure to covid-19: Secondary | ICD-10-CM | POA: Diagnosis not present

## 2020-12-15 DIAGNOSIS — J019 Acute sinusitis, unspecified: Secondary | ICD-10-CM | POA: Diagnosis not present

## 2020-12-18 ENCOUNTER — Other Ambulatory Visit: Payer: Self-pay | Admitting: Family Medicine

## 2020-12-19 ENCOUNTER — Ambulatory Visit: Payer: BC Managed Care – PPO | Admitting: Oncology

## 2020-12-19 ENCOUNTER — Other Ambulatory Visit: Payer: BC Managed Care – PPO

## 2020-12-19 DIAGNOSIS — G5603 Carpal tunnel syndrome, bilateral upper limbs: Secondary | ICD-10-CM | POA: Diagnosis not present

## 2020-12-28 ENCOUNTER — Other Ambulatory Visit: Payer: Self-pay

## 2020-12-28 ENCOUNTER — Inpatient Hospital Stay: Payer: BC Managed Care – PPO

## 2020-12-28 ENCOUNTER — Encounter: Payer: Self-pay | Admitting: Oncology

## 2020-12-28 ENCOUNTER — Inpatient Hospital Stay: Payer: BC Managed Care – PPO | Attending: Oncology

## 2020-12-28 ENCOUNTER — Inpatient Hospital Stay (HOSPITAL_BASED_OUTPATIENT_CLINIC_OR_DEPARTMENT_OTHER): Payer: BC Managed Care – PPO | Admitting: Oncology

## 2020-12-28 VITALS — BP 133/84 | HR 96 | Temp 98.2°F | Wt 227.1 lb

## 2020-12-28 VITALS — BP 152/99 | HR 99 | Temp 96.0°F | Resp 16

## 2020-12-28 DIAGNOSIS — I10 Essential (primary) hypertension: Secondary | ICD-10-CM | POA: Insufficient documentation

## 2020-12-28 DIAGNOSIS — Z7984 Long term (current) use of oral hypoglycemic drugs: Secondary | ICD-10-CM | POA: Diagnosis not present

## 2020-12-28 DIAGNOSIS — G4730 Sleep apnea, unspecified: Secondary | ICD-10-CM | POA: Diagnosis not present

## 2020-12-28 DIAGNOSIS — Z79890 Hormone replacement therapy (postmenopausal): Secondary | ICD-10-CM

## 2020-12-28 DIAGNOSIS — D500 Iron deficiency anemia secondary to blood loss (chronic): Secondary | ICD-10-CM

## 2020-12-28 DIAGNOSIS — E119 Type 2 diabetes mellitus without complications: Secondary | ICD-10-CM | POA: Diagnosis not present

## 2020-12-28 DIAGNOSIS — K219 Gastro-esophageal reflux disease without esophagitis: Secondary | ICD-10-CM | POA: Insufficient documentation

## 2020-12-28 DIAGNOSIS — Z79899 Other long term (current) drug therapy: Secondary | ICD-10-CM | POA: Diagnosis not present

## 2020-12-28 DIAGNOSIS — E785 Hyperlipidemia, unspecified: Secondary | ICD-10-CM | POA: Diagnosis not present

## 2020-12-28 DIAGNOSIS — N400 Enlarged prostate without lower urinary tract symptoms: Secondary | ICD-10-CM | POA: Diagnosis not present

## 2020-12-28 DIAGNOSIS — D751 Secondary polycythemia: Secondary | ICD-10-CM | POA: Diagnosis not present

## 2020-12-28 LAB — CBC WITH DIFFERENTIAL/PLATELET
Abs Immature Granulocytes: 0.03 10*3/uL (ref 0.00–0.07)
Basophils Absolute: 0 10*3/uL (ref 0.0–0.1)
Basophils Relative: 1 %
Eosinophils Absolute: 0.1 10*3/uL (ref 0.0–0.5)
Eosinophils Relative: 1 %
HCT: 51.3 % (ref 39.0–52.0)
Hemoglobin: 16.7 g/dL (ref 13.0–17.0)
Immature Granulocytes: 1 %
Lymphocytes Relative: 22 %
Lymphs Abs: 1.4 10*3/uL (ref 0.7–4.0)
MCH: 24.8 pg — ABNORMAL LOW (ref 26.0–34.0)
MCHC: 32.6 g/dL (ref 30.0–36.0)
MCV: 76.1 fL — ABNORMAL LOW (ref 80.0–100.0)
Monocytes Absolute: 0.7 10*3/uL (ref 0.1–1.0)
Monocytes Relative: 11 %
Neutro Abs: 4.2 10*3/uL (ref 1.7–7.7)
Neutrophils Relative %: 64 %
Platelets: 261 10*3/uL (ref 150–400)
RBC: 6.74 MIL/uL — ABNORMAL HIGH (ref 4.22–5.81)
RDW: 14.8 % (ref 11.5–15.5)
WBC: 6.4 10*3/uL (ref 4.0–10.5)
nRBC: 0 % (ref 0.0–0.2)

## 2020-12-28 LAB — FERRITIN: Ferritin: 16 ng/mL — ABNORMAL LOW (ref 24–336)

## 2020-12-28 LAB — IRON AND TIBC
Iron: 58 ug/dL (ref 45–182)
Saturation Ratios: 13 % — ABNORMAL LOW (ref 17.9–39.5)
TIBC: 452 ug/dL — ABNORMAL HIGH (ref 250–450)
UIBC: 394 ug/dL

## 2020-12-28 MED ORDER — FERROUS SULFATE 325 (65 FE) MG PO TBEC
325.0000 mg | DELAYED_RELEASE_TABLET | Freq: Two times a day (BID) | ORAL | 1 refills | Status: DC
Start: 2020-12-28 — End: 2021-04-01

## 2020-12-28 NOTE — Progress Notes (Signed)
Patient tolerated phlebotomy well, no concerns voiced. Patient discharged. Stable.

## 2020-12-28 NOTE — Progress Notes (Addendum)
Hematology/Oncology follow up  note [facility] [phone] [phone]   Patient Care Team: Virginia Crews, MD as PCP - General (Family Medicine)  REFERRING PROVIDER: Virginia Crews, MD  CHIEF COMPLAINTS/REASON FOR VISIT:  follo wup  of polycytosis/erythrocytosis  HISTORY OF PRESENTING ILLNESS:  Jonathan Fields is a 65 y.o. male who was seen in consultation at the request of Brita Romp, Dionne Bucy, MD for evaluation of polycytosis/erythrocytosis Reviewed patient's recent lab work 03/14/2020, hematocrit 55, 09/13/2019 hematocrit 51.8, 07/22/2019, hematocrit 49.1.  Hemoglobin 17.2.   Patient is on testosterone replacement.  Currently Dr. Bernardo Heater hold off treatments due to erythrocytosis. Patient was is Dr. Dennis Bast to hematology oncology for evaluation and management. Associated signs or symptoms: Patient mention about 15 pounds of unintentional weight loss recently during the past few weeks.  His appetite is unchanged.  He feels that he has eaten similar amount of food recently.  Denies any increase of activity level.  Some sweating for the past 2 days.  Context:  Smoking history: Denies. Testosterone supplements: History of blood clots: Previously he has an episode of provoked thrombosis after surgery. Daytime somnolence: Denies Family history of polycythemia: Denies  INTERVAL HISTORY Jonathan Fields is a 65 y.o. 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  Patient is testosterone injections weekly.  He has no new complaints today.. Review of Systems  Constitutional: Negative for appetite change, chills, fatigue, fever and unexpected weight change.  HENT:   Negative for hearing loss and voice change.   Eyes: Negative for eye  problems and icterus.  Respiratory: Negative for chest tightness, cough and shortness of breath.   Cardiovascular: Negative for chest pain and leg swelling.  Gastrointestinal: Negative for abdominal distention and abdominal pain.  Endocrine: Negative for hot flashes.  Genitourinary: Negative for difficulty urinating, dysuria and frequency.   Musculoskeletal: Negative for arthralgias.  Skin: Negative for itching and rash.  Neurological: Negative for light-headedness and numbness.  Hematological: Negative for adenopathy. Does not bruise/bleed easily.  Psychiatric/Behavioral: Negative for confusion.    MEDICAL HISTORY:  Past Medical History:  Diagnosis Date  . BPH (benign prostatic hyperplasia)   . Bronchitis   . Complication of anesthesia    hard to wake up  . DM (diabetes mellitus), type 2 ([place])   . DVT (deep venous thrombosis) ([place])    post op  . ED (erectile dysfunction)   . Erythrocytosis 07/04/2020  . GERD (gastroesophageal reflux disease)   . Hyperlipemia   . Hypertension   . Hypogonadism in male   . Nocturia   . Right ankle instability   . Sleep apnea   . TB lung, latent   . Urinary frequency     SURGICAL HISTORY: Past Surgical History:  Procedure Laterality Date  . ANKLE SURGERY Left    torn ligament  . CARPAL TUNNEL RELEASE Right 08/2019  . COLONOSCOPY  2009  . KNEE ARTHROSCOPY Left   . right ankle ruptured tendon surgery  2008  . TRANSURETHRAL RESECTION OF PROSTATE N/A 02/19/2018   Procedure: TRANSURETHRAL RESECTION OF THE PROSTATE (TURP);  Surgeon: [name], MD;  Location: [facility] ORS;  Service: Urology;  Laterality: N/A;    SOCIAL HISTORY: Social History   Socioeconomic History  . Marital status: Married    Spouse name: Not  on file  . Number of children: 0  . Years of education: Not on file  . Highest education level: Not on file  Occupational History  . Not on file  Tobacco Use  . Smoking status: Never Smoker  . Smokeless tobacco: Current  User    Types: Snuff  . Tobacco comment: tobaco pouches that produce juice  Vaping Use  . Vaping Use: Never used  Substance and Sexual Activity  . Alcohol use: Yes    Comment: socially, nothing every week  . Drug use: No  . Sexual activity: Yes    Partners: Female    Birth control/protection: None  Other Topics Concern  . Not on file  Social History Narrative  . Not on file   Social Determinants of Health   Financial Resource Strain: Not on file  Food Insecurity: Not on file  Transportation Needs: Not on file  Physical Activity: Not on file  Stress: Not on file  Social Connections: Not on file  Intimate Partner Violence: Not on file    FAMILY HISTORY: Family History  Problem Relation Age of Onset  . Heart disease Father   . Hypertension Father   . Prostate cancer Father   . Heart disease Mother   . Breast cancer Mother   . Colon cancer Neg Hx     ALLERGIES:  is allergic to penicillins.  MEDICATIONS:  Current Outpatient Medications  Medication Sig Dispense Refill  . atorvastatin (LIPITOR) 40 MG tablet Take 1 tablet (40 mg total) by mouth daily. 90 tablet 3  . empagliflozin (JARDIANCE) 25 MG TABS tablet Take 1 tablet (25 mg total) by mouth daily before breakfast. 90 tablet 3  . ferrous sulfate 325 (65 FE) MG EC tablet Take 1 tablet (325 mg total) by mouth in the morning and at bedtime. 60 tablet 1  . finasteride (PROSCAR) 5 MG tablet Take 5 mg by mouth daily.    . fluticasone (FLONASE) 50 MCG/ACT nasal spray instill 1 to 2 sprays into each nostril once daily  0  . hydrochlorothiazide (HYDRODIURIL) 12.5 MG tablet Take 1 tablet (12.5 mg total) by mouth daily. 90 tablet 3  . Lancets (ONETOUCH DELICA PLUS [id number]) MISC TEST EVERY DAY 100 each 1  . lisinopril (ZESTRIL) 40 MG tablet TAKE 1 TABLET BY MOUTH DAILY 90 tablet 0  . metFORMIN (GLUCOPHAGE-XR) 500 MG 24 hr tablet TAKE 4 TABLETS BY MOUTH EVERY MORNING 360 tablet 0  . NEEDLE, DISP, 21 G (BD SAFETYGLIDE SHIELDED  NEEDLE) 21G X 1-1/2" MISC Use 21guage needle to inject Testosterone Cypionate 63ml. 25 each 0  . omeprazole (PRILOSEC) 20 MG capsule Take 20 mg by mouth daily.     [name] test strip TEST EVERY DAY 100 strip 1  . SYRINGE-NEEDLE, DISP, 3 ML 18G X 1-1/2" 3 ML MISC Draw up 22ml Testosterone Cypionate with 18 gauge needle. 25 each 0  . testosterone cypionate (DEPOTESTOSTERONE CYPIONATE) 200 MG/ML injection INJECT 0.5 ML( 100 MG TOTAL) IN THE MUSCLE ONCE A WEEK 10 mL 0  . tamsulosin (FLOMAX) 0.4 MG CAPS capsule Take 1 capsule (0.4 mg total) by mouth daily. (Patient not taking: Reported on 12/28/2020) 90 capsule 3   No current facility-administered medications for this visit.     PHYSICAL EXAMINATION: ECOG PERFORMANCE STATUS: 0 - Asymptomatic Vitals:   12/28/20 1403  BP: 133/84  Pulse: 96  Temp: 98.2 F (36.8 C)  SpO2: 99%   Filed Weights   12/28/20 1403  Weight: 227 lb 1.6  oz (103 kg)    Physical Exam Constitutional:      General: He is not in acute distress. HENT:     Head: Normocephalic and atraumatic.  Eyes:     General: No scleral icterus. Cardiovascular:     Rate and Rhythm: Normal rate and regular rhythm.     Heart sounds: Normal heart sounds.  Pulmonary:     Effort: Pulmonary effort is normal. No respiratory distress.     Breath sounds: No wheezing.  Abdominal:     General: Bowel sounds are normal. There is no distension.     Palpations: Abdomen is soft.  Musculoskeletal:        General: No deformity. Normal range of motion.     Cervical back: Normal range of motion and neck supple.  Skin:    General: Skin is warm and dry.     Findings: No erythema or rash.  Neurological:     Mental Status: He is alert and oriented to person, place, and time. Mental status is at baseline.     Cranial Nerves: No cranial nerve deficit.     Coordination: Coordination normal.  Psychiatric:        Mood and Affect: Mood normal.     RADIOGRAPHIC STUDIES: I have personally  reviewed the radiological images as listed and agreed with the findings in the report. No results found.   LABORATORY DATA:  I have reviewed the data as listed Lab Results  Component Value Date   WBC 6.4 12/28/2020   HGB 16.7 12/28/2020   HCT 51.3 12/28/2020   MCV 76.1 (L) 12/28/2020   PLT 261 12/28/2020   Recent Labs    03/28/20 1050 04/13/20 0818 09/28/20 0942  NA 134* 137 137  K 4.6 4.7 4.1  CL 98 99 101  CO2 25 20 17*  GLUCOSE 429* 225* 228*  BUN 19 19 16   CREATININE 1.19 0.89 1.03  CALCIUM 8.9 9.5 9.0  GFRNONAA >60 90 76  GFRAA >60 104 88  PROT 7.1 6.6 6.4  ALBUMIN 3.6 4.2 4.3  AST 23 18 19   ALT 53* 29 25  ALKPHOS 68 84 80  BILITOT 1.2 1.1 1.0   Iron/TIBC/Ferritin/ %Sat    Component Value Date/Time   IRON 58 12/28/2020 1323   TIBC 452 (H) 12/28/2020 1323   FERRITIN 16 (L) 12/28/2020 1323   IRONPCTSAT 13 (L) 12/28/2020 1323        ASSESSMENT & PLAN:  1. Erythrocytosis   2. Long-term current use of testosterone replacement therapy   3. Iron deficiency anemia due to chronic blood loss    #Secondary erythrocytosis due to chronic testosterone use. Labs reviewed and discussed with patient. Hemoglobin 16.7, hematocrit 51.3. Proceed with phlebotomy 500 cc x 1 to keep hematocrit less than 50.  Microcytic anemia, iron panel showed iron deficiency. Discussed with patient and recommend patient to further discuss with urology to see if testosterone replacement dose reduction can be considered.  Recommend patient to take ferrous sulfate 325 mg twice daily.  Prescription was sent to pharmacy.  Continue H&H in 4 weeks and 8 weeks +/- phlebotomy. CC note to Dr. Bernardo Heater. Follow-up in 3 months for lab work and evaluation. Orders Placed This Encounter  Procedures  . CBC with Differential/Platelet    Standing Status:   Future    Standing Expiration Date:   12/28/2021  . Ferritin    Standing Status:   Future    Standing Expiration Date:   12/28/2021  . Iron  and  TIBC    Standing Status:   Future    Standing Expiration Date:   12/28/2021  . Hematocrit    Standing Status:   Future    Standing Expiration Date:   12/28/2021  . Hemoglobin    Standing Status:   Future    Standing Expiration Date:   12/28/2021  . Hematocrit    Standing Status:   Future    Standing Expiration Date:   12/28/2021  . Hemoglobin    Standing Status:   Future    Standing Expiration Date:   12/28/2021    We spent sufficient time to discuss many aspect of care, questions were answered to patient's satisfaction. The patient knows to call the clinic with any problems questions or concerns.  Earlie Server, MD, PhD 12/28/2020

## 2020-12-31 ENCOUNTER — Encounter: Payer: Self-pay | Admitting: Family Medicine

## 2020-12-31 ENCOUNTER — Ambulatory Visit: Payer: BC Managed Care – PPO | Admitting: Family Medicine

## 2020-12-31 ENCOUNTER — Other Ambulatory Visit: Payer: BC Managed Care – PPO

## 2020-12-31 ENCOUNTER — Other Ambulatory Visit: Payer: Self-pay

## 2020-12-31 VITALS — BP 138/76 | HR 98 | Temp 98.4°F | Ht 74.0 in | Wt 226.0 lb

## 2020-12-31 DIAGNOSIS — E1169 Type 2 diabetes mellitus with other specified complication: Secondary | ICD-10-CM

## 2020-12-31 DIAGNOSIS — E1159 Type 2 diabetes mellitus with other circulatory complications: Secondary | ICD-10-CM | POA: Diagnosis not present

## 2020-12-31 DIAGNOSIS — J0101 Acute recurrent maxillary sinusitis: Secondary | ICD-10-CM | POA: Diagnosis not present

## 2020-12-31 DIAGNOSIS — E785 Hyperlipidemia, unspecified: Secondary | ICD-10-CM

## 2020-12-31 DIAGNOSIS — Z20822 Contact with and (suspected) exposure to covid-19: Secondary | ICD-10-CM | POA: Diagnosis not present

## 2020-12-31 DIAGNOSIS — M12812 Other specific arthropathies, not elsewhere classified, left shoulder: Secondary | ICD-10-CM | POA: Diagnosis not present

## 2020-12-31 DIAGNOSIS — I152 Hypertension secondary to endocrine disorders: Secondary | ICD-10-CM

## 2020-12-31 DIAGNOSIS — N401 Enlarged prostate with lower urinary tract symptoms: Secondary | ICD-10-CM

## 2020-12-31 MED ORDER — BENZONATATE 100 MG PO CAPS: 100.0000 mg | ORAL_CAPSULE | Freq: Two times a day (BID) | ORAL | 0 refills | Status: DC | PRN

## 2020-12-31 MED ORDER — TAMSULOSIN HCL 0.4 MG PO CAPS: 0.8000 mg | ORAL_CAPSULE | Freq: Every day | ORAL | 0 refills | Status: DC

## 2020-12-31 MED ORDER — CEFDINIR 300 MG PO CAPS: 300.0000 mg | ORAL_CAPSULE | Freq: Two times a day (BID) | ORAL | 0 refills | Status: AC

## 2020-12-31 NOTE — Assessment & Plan Note (Signed)
Previously well controlled associated with HTN and hyperlipidemia Continue statin Continue metformin Continue Jardiance On ACE UTD on eye and foot exams Recheck A1c F/u 3 months

## 2020-12-31 NOTE — Assessment & Plan Note (Addendum)
S/p TURP Feels tamsulosin is not helpful Increase dose to 0.8 If not helpful f/u with urology

## 2020-12-31 NOTE — Assessment & Plan Note (Addendum)
S/p steroid injection ROM continues to be limited Continue exercise and home therapy Interested in trying anterior steroid injection Referral to orthopedics for US guided procedure

## 2020-12-31 NOTE — Assessment & Plan Note (Signed)
Continue Statin Goal LDL under 70  Recheck FLP in 3 months  F/u 3 months

## 2020-12-31 NOTE — Assessment & Plan Note (Addendum)
Acute and recurring problem Has a history of sinus infections every year Previously treated with aminoglycoside by his previous doctor 14 days of congestion, drainage and increased sinus pain and pressure Given a 7 day course of Clarithromycin from walk in clinic Known Penicillin allergy Start course of Cefdinir

## 2020-12-31 NOTE — Progress Notes (Signed)
Established patient visit   Patient: Jonathan Fields   DOB: [birth year]   65 y.o. Male  MRN: [id number] Visit Date: 12/31/2020  Today's healthcare provider: Lavon Paganini, MD   Chief Complaint  Patient presents with  . Follow-up   Subjective    HPI HPI    Pt presents with heavy sinus congestion x 2 weeks. Symptoms include congestion, pressure.  Pt would also like to get covid booster.   Last edited by Jonathan Fields on 12/31/2020  4:28 PM. (History)      [name] has been feeling 'poor' for the last two weeks. He had symptoms of congestion and malaise on the 19th of January and visited an urgent care where he was told he may have COVID. He was prescribed a 7 day course of clarithromycin and his COVID test was lost. He still  isolated for 5 days and tested negative via home rapid antigen on the 23rd. He continues to have sinus pressure, drainage, congestion and a cough that it worse at night. He does not report any changes in sense of smell or taste, fevers or chills or N/V/D. He has not taken his blood pressure medicine for the last two weeks due to concern for complication with robitussin.   He is also concerned about his urinary frequency, he says he will urinate 10-12x daily and that the flomax is providing minimal benefit.  He notes that his shoulder symptoms have mildly improve since his steroid injection and is interested in receiving an anterior shoulder injection.    (Show patient history (optional):23778::" "}   Medications: Outpatient Medications Prior to Visit  Medication Sig  . atorvastatin (LIPITOR) 40 MG tablet Take 1 tablet (40 mg total) by mouth daily.  . empagliflozin (JARDIANCE) 25 MG TABS tablet Take 1 tablet (25 mg total) by mouth daily before breakfast.  . ferrous sulfate 325 (65 FE) MG EC tablet Take 1 tablet (325 mg total) by mouth in the morning and at bedtime.  . finasteride (PROSCAR) 5 MG tablet Take 5 mg by mouth daily.  . fluticasone  (FLONASE) 50 MCG/ACT nasal spray instill 1 to 2 sprays into each nostril once daily  . hydrochlorothiazide (HYDRODIURIL) 12.5 MG tablet Take 1 tablet (12.5 mg total) by mouth daily.  . Lancets (ONETOUCH DELICA PLUS [id number]) MISC TEST EVERY DAY  . lisinopril (ZESTRIL) 40 MG tablet TAKE 1 TABLET BY MOUTH DAILY  . metFORMIN (GLUCOPHAGE-XR) 500 MG 24 hr tablet TAKE 4 TABLETS BY MOUTH EVERY MORNING  . NEEDLE, DISP, 21 G (BD SAFETYGLIDE SHIELDED NEEDLE) 21G X 1-1/2" MISC Use 21guage needle to inject Testosterone Cypionate 67ml.  Marland Kitchen omeprazole (PRILOSEC) 20 MG capsule Take 20 mg by mouth daily.   [name] test strip TEST EVERY DAY  . SYRINGE-NEEDLE, DISP, 3 ML 18G X 1-1/2" 3 ML MISC Draw up 71ml Testosterone Cypionate with 18 gauge needle.  . testosterone cypionate (DEPOTESTOSTERONE CYPIONATE) 200 MG/ML injection INJECT 0.5 ML( 100 MG TOTAL) IN THE MUSCLE ONCE A WEEK  . [DISCONTINUED] tamsulosin (FLOMAX) 0.4 MG CAPS capsule Take 1 capsule (0.4 mg total) by mouth daily.   No facility-administered medications prior to visit.    Review of Systems  Constitutional: Negative.   HENT: Positive for rhinorrhea, sinus pressure, sinus pain and sneezing.   Eyes: Negative.   Respiratory: Negative.   Cardiovascular: Negative.   Gastrointestinal: Negative.   Endocrine: Negative.   Genitourinary: Negative.   Musculoskeletal: Negative.   Skin: Negative.  Neurological: Negative.   Hematological: Negative.   Psychiatric/Behavioral: Negative.        Objective    BP 138/76 (BP Location: Right Arm, Patient Position: Sitting, Cuff Size: Large)   Pulse 98   Temp 98.4 F (36.9 C) (Oral)   Ht 6\' 2"  (1.88 m)   Wt 226 lb (102.5 kg)   SpO2 97%   BMI 29.02 kg/m     Physical Exam   General: Well appearing man sitting upright in NAD Pulm: Lungs CTA bilaterally Cards: RRR no murmurs rubs or gallops Abdomen: Soft non-tender, non-distended HEENT: Cranial nerves grossly intact, maxillary sinus  pressure appreciate on palpation. Moist mucous membranes MSK: Appropriately muscle tone, arms hanging freely at sides.  Psych: Alert and oriented, affect appropriate to situation.     No results found for any visits on 12/31/20.  Assessment & Plan     Problem List Items Addressed This Visit      Cardiovascular and Mediastinum   Hypertension associated with diabetes ([place])    BP 138/76 today Has not taken BP meds for last 2 weeks given sinus symptoms.  Restarted last night Recheck CMP       Relevant Orders   Comprehensive Metabolic Panel (CMET)     Respiratory   Acute recurrent maxillary sinusitis    Acute and recurring problem Has a history of sinus infections every year Previously treated with aminoglycoside by his previous doctor 14 days of congestion, drainage and increased sinus pain and pressure Given a 7 day course of Clarithromycin from walk in clinic Known Penicillin allergy Start course of Cefdinir       Relevant Medications   cefdinir (OMNICEF) 300 MG capsule   benzonatate (TESSALON) 100 MG capsule     Endocrine   T2DM (type 2 diabetes mellitus) ([place])    Previously well controlled associated with HTN and hyperlipidemia Continue statin Continue metformin Continue Jardiance On ACE UTD on eye and foot exams Recheck A1c F/u 3 months      Relevant Orders   Hemoglobin A1c   Hyperlipidemia associated with type 2 diabetes mellitus ([facility])    Continue Statin Goal LDL under 70  Recheck FLP in 3 months  F/u 3 months        Musculoskeletal and Integument   Rotator cuff arthropathy of left shoulder - Primary    S/p steroid injection ROM continues to be limited Continue exercise and home therapy Interested in trying anterior steroid injection Referral to orthopedics for US guided procedure      Relevant Orders   Ambulatory referral to Orthopedic Surgery     Genitourinary   BPH (benign prostatic hyperplasia)    S/p TURP Feels tamsulosin is not  helpful Increase dose to 0.8 If not helpful f/u with urology      Relevant Medications   tamsulosin (FLOMAX) 0.4 MG CAPS capsule       Return in about 3 months (around 03/30/2021) for chronic disease f/u.      Patient seen along with [employer] student [facility]. I personally evaluated this patient along with the student, and verified all aspects of the history, physical exam, and medical decision making as documented by the student. I agree with the student's documentation and have made all necessary edits.  Saquoia Sianez, Dionne Bucy, MD, MPH [facility]

## 2020-12-31 NOTE — Assessment & Plan Note (Signed)
BP 138/76 today Has not taken BP meds for last 2 weeks given sinus symptoms.  Restarted last night Recheck CMP

## 2021-01-02 ENCOUNTER — Ambulatory Visit (INDEPENDENT_AMBULATORY_CARE_PROVIDER_SITE_OTHER): Payer: BC Managed Care – PPO

## 2021-01-02 DIAGNOSIS — Z23 Encounter for immunization: Secondary | ICD-10-CM

## 2021-01-02 LAB — NOVEL CORONAVIRUS, NAA: SARS-CoV-2, NAA: NOT DETECTED

## 2021-01-02 LAB — SARS-COV-2, NAA 2 DAY TAT

## 2021-01-17 DIAGNOSIS — M19012 Primary osteoarthritis, left shoulder: Secondary | ICD-10-CM | POA: Diagnosis not present

## 2021-01-25 ENCOUNTER — Inpatient Hospital Stay: Payer: BC Managed Care – PPO

## 2021-01-25 ENCOUNTER — Other Ambulatory Visit: Payer: Self-pay

## 2021-01-25 ENCOUNTER — Inpatient Hospital Stay: Payer: BC Managed Care – PPO | Attending: Oncology

## 2021-01-25 VITALS — BP 132/99 | HR 81 | Temp 96.2°F | Resp 17

## 2021-01-25 DIAGNOSIS — D751 Secondary polycythemia: Secondary | ICD-10-CM | POA: Insufficient documentation

## 2021-01-25 DIAGNOSIS — D509 Iron deficiency anemia, unspecified: Secondary | ICD-10-CM | POA: Insufficient documentation

## 2021-01-25 LAB — HEMATOCRIT: HCT: 51.2 % (ref 39.0–52.0)

## 2021-01-25 LAB — HEMOGLOBIN: Hemoglobin: 16.3 g/dL (ref 13.0–17.0)

## 2021-01-25 NOTE — Progress Notes (Signed)
Patient completed 300 ml therapeutic phlebotomy today. No complaints at discharge. VSS.

## 2021-01-28 ENCOUNTER — Other Ambulatory Visit: Payer: Self-pay | Admitting: *Deleted

## 2021-01-28 DIAGNOSIS — N401 Enlarged prostate with lower urinary tract symptoms: Secondary | ICD-10-CM

## 2021-01-28 DIAGNOSIS — E291 Testicular hypofunction: Secondary | ICD-10-CM

## 2021-01-31 ENCOUNTER — Other Ambulatory Visit: Payer: BC Managed Care – PPO

## 2021-01-31 ENCOUNTER — Other Ambulatory Visit: Payer: Self-pay

## 2021-01-31 DIAGNOSIS — N401 Enlarged prostate with lower urinary tract symptoms: Secondary | ICD-10-CM

## 2021-01-31 DIAGNOSIS — E291 Testicular hypofunction: Secondary | ICD-10-CM | POA: Diagnosis not present

## 2021-02-01 LAB — HEMATOCRIT: Hematocrit: 49.4 % (ref 37.5–51.0)

## 2021-02-01 LAB — TESTOSTERONE: Testosterone: 421 ng/dL (ref 264–916)

## 2021-02-01 LAB — PSA: Prostate Specific Ag, Serum: 1 ng/mL (ref 0.0–4.0)

## 2021-02-06 ENCOUNTER — Ambulatory Visit (INDEPENDENT_AMBULATORY_CARE_PROVIDER_SITE_OTHER): Payer: BC Managed Care – PPO | Admitting: Urology

## 2021-02-06 ENCOUNTER — Other Ambulatory Visit: Payer: Self-pay

## 2021-02-06 ENCOUNTER — Encounter: Payer: Self-pay | Admitting: Urology

## 2021-02-06 VITALS — BP 126/84 | HR 97 | Ht 74.0 in | Wt 225.0 lb

## 2021-02-06 DIAGNOSIS — N486 Induration penis plastica: Secondary | ICD-10-CM

## 2021-02-06 DIAGNOSIS — E291 Testicular hypofunction: Secondary | ICD-10-CM | POA: Diagnosis not present

## 2021-02-06 DIAGNOSIS — N401 Enlarged prostate with lower urinary tract symptoms: Secondary | ICD-10-CM | POA: Diagnosis not present

## 2021-02-06 NOTE — Progress Notes (Signed)
02/06/2021 3:22 PM   Jonathan Fields 04/27/1956 [id number]  Referring provider: Virginia Crews, [place] [facility] [place],  [place] [address]  Chief Complaint  Patient presents with  . Hypogonadism    Urologic history: 1.BPH with lower urinary tract symptoms -Prior medical management -UDSconsistent with obstruction. -TURP by Dr. Pilar Jarvis 01/2018; 7 g, benign   2.Hypogonadism  -On TRT   HPI: 65 y.o. male presents for annual follow-up.   Since last years visit has noted worsening lower urinary tract symptoms  Complains of daytime urinary frequency, urgency  Complains of nocturia q1.5 hours  Has been diagnosed with sleep apnea and unable to tolerate CPAP  IPSS today 16/35 with storage related voiding symptoms most bothersome  On tamsulosin which was increased to 0.8 mg by PCP and saw no symptomatic improvement  Had also been on finasteride that he had discontinued and recently started taking again  Denies dysuria, gross hematuria   Followed by hematology for testosterone induced erythrocytosis  Testosterone dose has been increased to 100 mg weekly  Labs 01/31/2021: Testosterone 421; hematocrit 49.4; PSA 1.0  Has Peyronie's disease with ventral curvature and inquired about Xiaflex    PMH: Past Medical History:  Diagnosis Date  . BPH (benign prostatic hyperplasia)   . Bronchitis   . Complication of anesthesia    hard to wake up  . DM (diabetes mellitus), type 2 ([place])   . DVT (deep venous thrombosis) ([place])    post op  . ED (erectile dysfunction)   . Erythrocytosis 07/04/2020  . GERD (gastroesophageal reflux disease)   . Hyperlipemia   . Hypertension   . Hypogonadism in male   . Nocturia   . Right ankle instability   . Sleep apnea   . TB lung, latent   . Urinary frequency     Surgical History: Past Surgical History:  Procedure Laterality Date  . ANKLE SURGERY Left    torn  ligament  . CARPAL TUNNEL RELEASE Right 08/2019  . COLONOSCOPY  2009  . KNEE ARTHROSCOPY Left   . right ankle ruptured tendon surgery  2008  . TRANSURETHRAL RESECTION OF PROSTATE N/A 02/19/2018   Procedure: TRANSURETHRAL RESECTION OF THE PROSTATE (TURP);  Surgeon: [name], MD;  Location: [facility] ORS;  Service: Urology;  Laterality: N/A;    Home Medications:  Allergies as of 02/06/2021      Reactions   Penicillins Hives   Has patient had a PCN reaction causing immediate rash, facial/tongue/throat swelling, SOB or lightheadedness with hypotension: yes Has patient had a PCN reaction causing severe rash involving mucus membranes or skin necrosis: no Has patient had a PCN reaction that required hospitalization: no Has patient had a PCN reaction occurring within the last 10 years: no If all of the above answers are "NO", then may proceed with Cephalosporin use.      Medication List       Accurate as of February 06, 2021  3:22 PM. If you have any questions, ask your nurse or doctor.        atorvastatin 40 MG tablet Commonly known as: LIPITOR Take 1 tablet (40 mg total) by mouth daily.   BD SafetyGlide Shielded Needle 21G X 1-1/2" Misc Generic drug: NEEDLE (DISP) 21 G Use 21guage needle to inject Testosterone Cypionate 68ml.   benzonatate 100 MG capsule Commonly known as: TESSALON Take 1 capsule (100 mg total) by mouth 2 (two) times daily as needed for cough.   empagliflozin 25 MG Tabs  tablet Commonly known as: JARDIANCE Take 1 tablet (25 mg total) by mouth daily before breakfast.   ferrous sulfate 325 (65 FE) MG EC tablet Take 1 tablet (325 mg total) by mouth in the morning and at bedtime.   finasteride 5 MG tablet Commonly known as: PROSCAR Take 5 mg by mouth daily.   fluticasone 50 MCG/ACT nasal spray Commonly known as: FLONASE instill 1 to 2 sprays into each nostril once daily   hydrochlorothiazide 12.5 MG tablet Commonly known as: HYDRODIURIL Take 1 tablet (12.5  mg total) by mouth daily.   lisinopril 40 MG tablet Commonly known as: ZESTRIL TAKE 1 TABLET BY MOUTH DAILY   metFORMIN 500 MG 24 hr tablet Commonly known as: GLUCOPHAGE-XR TAKE 4 TABLETS BY MOUTH EVERY MORNING   omeprazole 20 MG capsule Commonly known as: PRILOSEC Take 20 mg by mouth daily.   OneTouch Delica Plus [id number] Misc TEST EVERY DAY   OneTouch Ultra test strip Generic drug: glucose blood TEST EVERY DAY   SYRINGE-NEEDLE (DISP) 3 ML 18G X 1-1/2" 3 ML Misc Draw up 63ml Testosterone Cypionate with 18 gauge needle.   tamsulosin 0.4 MG Caps capsule Commonly known as: FLOMAX Take 2 capsules (0.8 mg total) by mouth daily.   testosterone cypionate 200 MG/ML injection Commonly known as: DEPOTESTOSTERONE CYPIONATE INJECT 0.5 ML( 100 MG TOTAL) IN THE MUSCLE ONCE A WEEK       Allergies:  Allergies  Allergen Reactions  . Penicillins Hives    Has patient had a PCN reaction causing immediate rash, facial/tongue/throat swelling, SOB or lightheadedness with hypotension: yes Has patient had a PCN reaction causing severe rash involving mucus membranes or skin necrosis: no Has patient had a PCN reaction that required hospitalization: no Has patient had a PCN reaction occurring within the last 10 years: no If all of the above answers are "NO", then may proceed with Cephalosporin use.     Family History: Family History  Problem Relation Age of Onset  . Heart disease Father   . Hypertension Father   . Prostate cancer Father   . Heart disease Mother   . Breast cancer Mother   . Colon cancer Neg Hx     Social History:  reports that he has never smoked. His smokeless tobacco use includes snuff. He reports current alcohol use. He reports that he does not use drugs.   Physical Exam: BP 126/84   Pulse 97   Ht 6\' 2"  (1.88 m)   Wt 225 lb (102.1 kg)   BMI 28.89 kg/m   Constitutional:  Alert and oriented, No acute distress. HEENT: [place] AT, moist mucus membranes.  Trachea  midline, no masses. Cardiovascular: No clubbing, cyanosis, or edema. Respiratory: Normal respiratory effort, no increased work of breathing. GI: Abdomen is soft, nontender, nondistended, no abdominal masses GU: Prostate 50 g, smooth without nodules or induration; slight asymmetry L >R but consistency normal uniform throughout Neurologic: Grossly intact, no focal deficits, moving all 4 extremities. Psychiatric: Normal mood and affect.   Assessment & Plan:    1.  BPH with LUTS  Worsening storage related voiding symptoms  No improvement on tamsulosin 0.8 mg and recommend decreasing back to 0.4  Trial Myrbetriq 50 mg daily-4 weeks samples given and he will call back regarding efficacy  2.  Hypogonadism  Testosterone level, PSA and hematocrit look good  Continue hematology follow-up  Continue present testosterone dose  89-month follow-up testosterone, hematocrit  1 year office visit for DRE, testosterone, PSA, hematocrit  3.  Peyronie's disease  Dorsal curvature and he was informed would not be a candidate for Sylvan Cheese, MD  [facility] [address], [place], [place] [address] [phone]

## 2021-02-07 ENCOUNTER — Encounter: Payer: Self-pay | Admitting: Urology

## 2021-02-18 ENCOUNTER — Other Ambulatory Visit: Payer: Self-pay | Admitting: Family Medicine

## 2021-02-22 ENCOUNTER — Other Ambulatory Visit: Payer: Self-pay

## 2021-02-22 ENCOUNTER — Inpatient Hospital Stay: Payer: BC Managed Care – PPO

## 2021-02-22 ENCOUNTER — Inpatient Hospital Stay: Payer: BC Managed Care – PPO | Attending: Oncology

## 2021-02-22 DIAGNOSIS — D751 Secondary polycythemia: Secondary | ICD-10-CM

## 2021-02-22 DIAGNOSIS — D500 Iron deficiency anemia secondary to blood loss (chronic): Secondary | ICD-10-CM | POA: Insufficient documentation

## 2021-02-22 LAB — HEMATOCRIT: HCT: 48.9 % (ref 39.0–52.0)

## 2021-02-22 LAB — HEMOGLOBIN: Hemoglobin: 15.4 g/dL (ref 13.0–17.0)

## 2021-02-22 NOTE — Progress Notes (Signed)
HCT < 50; Per parameters no phlebotomy required today.

## 2021-03-04 ENCOUNTER — Telehealth: Payer: Self-pay | Admitting: Urology

## 2021-03-04 ENCOUNTER — Other Ambulatory Visit: Payer: Self-pay | Admitting: *Deleted

## 2021-03-04 ENCOUNTER — Other Ambulatory Visit: Payer: Self-pay

## 2021-03-04 MED ORDER — MIRABEGRON ER 50 MG PO TB24: 50.0000 mg | ORAL_TABLET | Freq: Every day | ORAL | 3 refills | Status: DC

## 2021-03-04 NOTE — Telephone Encounter (Signed)
Pt. States after completing a trial of Myrbetriq 50mg . He is pleased with results and would like a prescription called in to his pharmacy [facility] in [place])

## 2021-03-04 NOTE — Telephone Encounter (Signed)
Patient left a vmail on triage line that myrbetriq was working well for him and he would like a script sent into pharmacy. Script previously sent this morning. Patient was notified

## 2021-03-05 ENCOUNTER — Ambulatory Visit: Payer: Self-pay | Admitting: *Deleted

## 2021-03-05 NOTE — Telephone Encounter (Signed)
Needs to be seen ASAP. Since it comes and goes nad is not acute, no need for ED, but recommend eval tomorrow or thursday

## 2021-03-05 NOTE — Telephone Encounter (Signed)
Pt reports double vision, comes and goes. States occurred 2 times past month, last 3 days each time driving. Pt states occurs "Only when driving." Resolves on own "When I stop driving." Does not drive at night, headlights not the issue. States when I close one eye, see fine, when I close the other eye, see fine also." Denies pain. No new medications, no new eye drops. Denies headache, does not check  BP. States saw  Ophthalmologist 2 weeks ago "I'm going to have eyelid surgery." Pt reluctant to triage further, "Do I need appt or not."  Assured NT would route to practice for PCPs review and final disposition. Advised ED for worsening symptoms. Pt verbalizes understanding.  Please advise: [phone]  Reason for Disposition . Double vision    "Occurs only when driving."  Answer Assessment - Initial Assessment Questions 1. DESCRIPTION: "What is the vision loss like? Describe it for me." (e.g., complete vision loss, blurred vision, double vision, floaters, etc.)    Double vision 2. LOCATION: "One or both eyes?" If one, ask: "Which eye?"     Both eyes 3. SEVERITY: "Can you see anything?" If Yes, ask: "What can you see?" (e.g., fine print)      4. ONSET: "When did this begin?" "Did it start suddenly or has this been gradual?"     2 times past month. Last 3 days in a row now. 5. PATTERN: "Does this come and go, or has it been constant since it started?"     Comes and goes , only with driving. During the day. 6. PAIN: "Is there any pain in your eye(s)?"  (Scale 1-10; or mild, moderate, severe)     none 7. CONTACTS-GLASSES: "Do you wear contacts or glasses?"     no 8. CAUSE: "What do you think is causing this visual problem?"     *Unsure 9. OTHER SYMPTOMS: "Do you have any other symptoms?" (e.g., confusion, headache, arm or leg weakness, speech problems)    None  Protocols used: VISION LOSS OR CHANGE-A-AH

## 2021-03-18 ENCOUNTER — Other Ambulatory Visit: Payer: Self-pay | Admitting: Family Medicine

## 2021-03-18 NOTE — Telephone Encounter (Signed)
  Notes to clinic:  Review for refill Looks like patient is suppose to follow up on change in dose to see if helpful   Requested Prescriptions  Pending Prescriptions Disp Refills   tamsulosin (FLOMAX) 0.4 MG CAPS capsule [Pharmacy Med Name: TAMSULOSIN 0.4MG  CAPSULES] 180 capsule 0    Sig: TAKE 2 CAPSULES(0.8 MG) BY MOUTH DAILY      Urology: Alpha-Adrenergic Blocker Failed - 03/18/2021  7:02 AM      Failed - Last BP in normal range    BP Readings from Last 1 Encounters:  02/22/21 (!) 144/79          Passed - Valid encounter within last 12 months    Recent Outpatient Visits           2 months ago Rotator cuff arthropathy of left shoulder   [facility] [place], Dionne Bucy, MD   5 months ago Hypertension associated with diabetes [facility])   [place], Dionne Bucy, MD   8 months ago Type 2 diabetes mellitus with other specified complication, without long-term current use of insulin [facility])   [place], [place], PA-C   9 months ago Type 2 diabetes mellitus with other specified complication, without long-term current use of insulin [name])   [place], [place], [place]   11 months ago Type 2 diabetes mellitus with other specified complication, without long-term current use of insulin [facility])   [place], Dionne Bucy, MD       Future Appointments             In 2 weeks [name], Dionne Bucy, MD [facility], [place]   In 10 months [place], Ronda Fairly, MD [place]

## 2021-03-20 DIAGNOSIS — G4730 Sleep apnea, unspecified: Secondary | ICD-10-CM | POA: Diagnosis not present

## 2021-03-20 DIAGNOSIS — H02102 Unspecified ectropion of right lower eyelid: Secondary | ICD-10-CM | POA: Diagnosis not present

## 2021-03-20 DIAGNOSIS — H02834 Dermatochalasis of left upper eyelid: Secondary | ICD-10-CM | POA: Diagnosis not present

## 2021-03-20 DIAGNOSIS — H02105 Unspecified ectropion of left lower eyelid: Secondary | ICD-10-CM | POA: Diagnosis not present

## 2021-03-20 DIAGNOSIS — Z7984 Long term (current) use of oral hypoglycemic drugs: Secondary | ICD-10-CM | POA: Diagnosis not present

## 2021-03-20 DIAGNOSIS — H02832 Dermatochalasis of right lower eyelid: Secondary | ICD-10-CM | POA: Diagnosis not present

## 2021-03-20 DIAGNOSIS — K219 Gastro-esophageal reflux disease without esophagitis: Secondary | ICD-10-CM | POA: Diagnosis not present

## 2021-03-20 DIAGNOSIS — H02831 Dermatochalasis of right upper eyelid: Secondary | ICD-10-CM | POA: Diagnosis not present

## 2021-03-20 DIAGNOSIS — H02835 Dermatochalasis of left lower eyelid: Secondary | ICD-10-CM | POA: Diagnosis not present

## 2021-03-20 DIAGNOSIS — E119 Type 2 diabetes mellitus without complications: Secondary | ICD-10-CM | POA: Diagnosis not present

## 2021-03-20 DIAGNOSIS — H02403 Unspecified ptosis of bilateral eyelids: Secondary | ICD-10-CM | POA: Diagnosis not present

## 2021-03-20 DIAGNOSIS — H02423 Myogenic ptosis of bilateral eyelids: Secondary | ICD-10-CM | POA: Diagnosis not present

## 2021-03-20 DIAGNOSIS — I10 Essential (primary) hypertension: Secondary | ICD-10-CM | POA: Diagnosis not present

## 2021-03-20 DIAGNOSIS — Z79899 Other long term (current) drug therapy: Secondary | ICD-10-CM | POA: Diagnosis not present

## 2021-03-25 ENCOUNTER — Other Ambulatory Visit: Payer: Self-pay | Admitting: Family Medicine

## 2021-03-25 DIAGNOSIS — R0981 Nasal congestion: Secondary | ICD-10-CM

## 2021-03-25 NOTE — Telephone Encounter (Signed)
Requested medication (s) are due for refill today:   Provider to review.   Course was completed.  Discontinued 09/28/2020 by Dr. Brita Romp  Requested medication (s) are on the active medication list:   No  Future visit scheduled:   Yes in 1 wk with Dr. Brita Romp   Last ordered: 09/28/2020  See above note.  Returned for provider review for refill since it has been discontinued.   Requested Prescriptions  Pending Prescriptions Disp Refills   montelukast (SINGULAIR) 10 MG tablet [Pharmacy Med Name: MONTELUKAST 10MG  TABLETS] 90 tablet 1    Sig: TAKE 1 TABLET(10 MG) BY MOUTH DAILY AS NEEDED      Pulmonology:  Leukotriene Inhibitors Passed - 03/25/2021  9:30 AM      Passed - Valid encounter within last 12 months    Recent Outpatient Visits           2 months ago Rotator cuff arthropathy of left shoulder   [facility] [place], Dionne Bucy, MD   5 months ago Hypertension associated with diabetes [facility])   [facility], Dionne Bucy, MD   9 months ago Type 2 diabetes mellitus with other specified complication, without long-term current use of insulin [facility])   [place], [place], PA-C   9 months ago Type 2 diabetes mellitus with other specified complication, without long-term current use of insulin [facility])   [place], [place], [place]   11 months ago Type 2 diabetes mellitus with other specified complication, without long-term current use of insulin [facility])   [place], Dionne Bucy, MD       Future Appointments             In 1 week [name], Dionne Bucy, MD [facility], [place]   In 10 months [place], Ronda Fairly, MD [place]

## 2021-03-26 ENCOUNTER — Ambulatory Visit: Payer: BC Managed Care – PPO | Admitting: Podiatry

## 2021-03-26 ENCOUNTER — Other Ambulatory Visit: Payer: Self-pay

## 2021-03-26 DIAGNOSIS — B070 Plantar wart: Secondary | ICD-10-CM

## 2021-03-26 NOTE — Progress Notes (Signed)
   Subjective: 65 y.o. male presenting today as a new patient for evaluation of a symptomatic skin lesion to the plantar aspect of the left forefoot.  It is very painful and has been present for few months now.  He has been keeping it covered for relief.  He presents for further treatment and evaluation   Past Medical History:  Diagnosis Date  . BPH (benign prostatic hyperplasia)   . Bronchitis   . Complication of anesthesia    hard to wake up  . DM (diabetes mellitus), type 2 ([place])   . DVT (deep venous thrombosis) ([place])    post op  . ED (erectile dysfunction)   . Erythrocytosis 07/04/2020  . GERD (gastroesophageal reflux disease)   . Hyperlipemia   . Hypertension   . Hypogonadism in male   . Nocturia   . Right ankle instability   . Sleep apnea   . TB lung, latent   . Urinary frequency     Objective: Physical Exam General: The patient is alert and oriented x3 in no acute distress.   Dermatology: Hyperkeratotic skin lesion(s) noted to the plantar aspect of the left foot approximately 1 cm in diameter. Pinpoint bleeding noted upon debridement. Skin is warm, dry and supple bilateral lower extremities. Negative for open lesions or macerations.   Vascular: Palpable pedal pulses bilaterally. No edema or erythema noted. Capillary refill within normal limits.   Neurological: Epicritic and protective threshold grossly intact bilaterally.    Musculoskeletal Exam: Pain on palpation to the noted skin lesion(s).  Range of motion within normal limits to all pedal and ankle joints bilateral. Muscle strength 5/5 in all groups bilateral.    Assessment: #1 plantar wart left foot   Plan of Care:  #1 Patient was evaluated. #2 Excisional debridement of the plantar wart lesion(s) was performed using a chisel blade.  Salicylic acid was applied and the lesion(s) was dressed with a dry sterile dressing. #3  Recommend OTC salicylic acid daily #4 return to clinic as needed  Edrick Kins,  DPM [facility]  Dr. Edrick Kins, DPM    [address], [place] [address]                Office [phone]  Fax [phone]

## 2021-03-27 ENCOUNTER — Other Ambulatory Visit: Payer: Self-pay | Admitting: Urology

## 2021-03-29 ENCOUNTER — Inpatient Hospital Stay: Payer: BC Managed Care – PPO | Attending: Oncology

## 2021-03-29 ENCOUNTER — Encounter: Payer: Self-pay | Admitting: Oncology

## 2021-03-29 ENCOUNTER — Inpatient Hospital Stay (HOSPITAL_BASED_OUTPATIENT_CLINIC_OR_DEPARTMENT_OTHER): Payer: BC Managed Care – PPO | Admitting: Oncology

## 2021-03-29 ENCOUNTER — Inpatient Hospital Stay: Payer: BC Managed Care – PPO

## 2021-03-29 ENCOUNTER — Other Ambulatory Visit: Payer: Self-pay

## 2021-03-29 VITALS — BP 111/92 | HR 89 | Temp 98.2°F | Resp 16 | Wt 222.8 lb

## 2021-03-29 VITALS — BP 125/90 | HR 85 | Resp 17

## 2021-03-29 DIAGNOSIS — D751 Secondary polycythemia: Secondary | ICD-10-CM

## 2021-03-29 DIAGNOSIS — Z79899 Other long term (current) drug therapy: Secondary | ICD-10-CM | POA: Diagnosis not present

## 2021-03-29 DIAGNOSIS — D509 Iron deficiency anemia, unspecified: Secondary | ICD-10-CM | POA: Insufficient documentation

## 2021-03-29 DIAGNOSIS — D500 Iron deficiency anemia secondary to blood loss (chronic): Secondary | ICD-10-CM | POA: Diagnosis not present

## 2021-03-29 DIAGNOSIS — Z79890 Hormone replacement therapy (postmenopausal): Secondary | ICD-10-CM

## 2021-03-29 LAB — CBC WITH DIFFERENTIAL/PLATELET
Abs Immature Granulocytes: 0.09 10*3/uL — ABNORMAL HIGH (ref 0.00–0.07)
Basophils Absolute: 0.1 10*3/uL (ref 0.0–0.1)
Basophils Relative: 1 %
Eosinophils Absolute: 0.2 10*3/uL (ref 0.0–0.5)
Eosinophils Relative: 2 %
HCT: 52.1 % — ABNORMAL HIGH (ref 39.0–52.0)
Hemoglobin: 16.6 g/dL (ref 13.0–17.0)
Immature Granulocytes: 1 %
Lymphocytes Relative: 19 %
Lymphs Abs: 1.4 10*3/uL (ref 0.7–4.0)
MCH: 24.7 pg — ABNORMAL LOW (ref 26.0–34.0)
MCHC: 31.9 g/dL (ref 30.0–36.0)
MCV: 77.4 fL — ABNORMAL LOW (ref 80.0–100.0)
Monocytes Absolute: 0.9 10*3/uL (ref 0.1–1.0)
Monocytes Relative: 13 %
Neutro Abs: 4.7 10*3/uL (ref 1.7–7.7)
Neutrophils Relative %: 64 %
Platelets: 313 10*3/uL (ref 150–400)
RBC: 6.73 MIL/uL — ABNORMAL HIGH (ref 4.22–5.81)
RDW: 15.1 % (ref 11.5–15.5)
WBC: 7.2 10*3/uL (ref 4.0–10.5)
nRBC: 0 % (ref 0.0–0.2)

## 2021-03-29 LAB — IRON AND TIBC
Iron: 105 ug/dL (ref 45–182)
Saturation Ratios: 21 % (ref 17.9–39.5)
TIBC: 510 ug/dL — ABNORMAL HIGH (ref 250–450)
UIBC: 405 ug/dL

## 2021-03-29 LAB — FERRITIN: Ferritin: 18 ng/mL — ABNORMAL LOW (ref 24–336)

## 2021-03-29 NOTE — Progress Notes (Signed)
300 ml phlebotomy performed. Patient tolerated well. Pt ate and drank post procedure. VSS. Feeling at his norm at time of discharge.

## 2021-03-29 NOTE — Progress Notes (Signed)
Hematology/Oncology follow up  note [facility] Telephone:([phone] [phone]) [phone]   Patient Care Team: Virginia Crews, MD as PCP - General (Family Medicine)  REFERRING PROVIDER: Virginia Crews, MD  CHIEF COMPLAINTS/REASON FOR VISIT:  follo wup  of polycytosis/erythrocytosis  HISTORY OF PRESENTING ILLNESS:  Jonathan Fields is a 65 y.o. male who was seen in consultation at the request of Brita Romp, Dionne Bucy, MD for evaluation of polycytosis/erythrocytosis Reviewed patient's recent lab work 03/14/2020, hematocrit 55, 09/13/2019 hematocrit 51.8, 07/22/2019, hematocrit 49.1.  Hemoglobin 17.2.   Patient is on testosterone replacement.  Currently Dr. Bernardo Heater hold off treatments due to erythrocytosis. Patient was is Dr. Dennis Bast to hematology oncology for evaluation and management. Associated signs or symptoms: Patient mention about 15 pounds of unintentional weight loss recently during the past few weeks.  His appetite is unchanged.  He feels that he has eaten similar amount of food recently.  Denies any increase of activity level.  Some sweating for the past 2 days.  Context:  Smoking history: Denies. Testosterone supplements: History of blood clots: Previously he has an episode of provoked thrombosis after surgery. Daytime somnolence: Denies Family history of polycythemia: Denies  INTERVAL HISTORY [name] is a 65 y.o. 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  He reports feeling very well. He is on chronic testosterone replacement therapy. No new complaints.  Review of Systems  Constitutional: Negative for appetite change, chills, fatigue, fever and unexpected weight change.  HENT:   Negative for hearing loss and voice change.    Eyes: Negative for eye problems and icterus.  Respiratory: Negative for chest tightness, cough and shortness of breath.   Cardiovascular: Negative for chest pain and leg swelling.  Gastrointestinal: Negative for abdominal distention and abdominal pain.  Endocrine: Negative for hot flashes.  Genitourinary: Negative for difficulty urinating, dysuria and frequency.   Musculoskeletal: Negative for arthralgias.  Skin: Negative for itching and rash.  Neurological: Negative for light-headedness and numbness.  Hematological: Negative for adenopathy. Does not bruise/bleed easily.  Psychiatric/Behavioral: Negative for confusion.    MEDICAL HISTORY:  Past Medical History:  Diagnosis Date  . BPH (benign prostatic hyperplasia)   . Bronchitis   . Complication of anesthesia    hard to wake up  . DM (diabetes mellitus), type 2 ([place])   . DVT (deep venous thrombosis) ([place])    post op  . ED (erectile dysfunction)   . Erythrocytosis 07/04/2020  . GERD (gastroesophageal reflux disease)   . Hyperlipemia   . Hypertension   . Hypogonadism in male   . Nocturia   . Right ankle instability   . Sleep apnea   . TB lung, latent   . Urinary frequency     SURGICAL HISTORY: Past Surgical History:  Procedure Laterality Date  . ANKLE SURGERY Left    torn ligament  . CARPAL TUNNEL RELEASE Right 08/2019  . COLONOSCOPY  2009  . KNEE ARTHROSCOPY Left   . right ankle ruptured tendon surgery  2008  . TRANSURETHRAL RESECTION OF PROSTATE N/A 02/19/2018   Procedure: TRANSURETHRAL RESECTION OF THE PROSTATE (TURP);  Surgeon: [name], MD;  Location: [facility] ORS;  Service: Urology;  Laterality: N/A;    SOCIAL HISTORY: Social History   Socioeconomic History  . Marital status: Married  Spouse name: Not on file  . Number of children: 0  . Years of education: Not on file  . Highest education level: Not on file  Occupational History  . Not on file  Tobacco Use  . Smoking status: Never Smoker  .  Smokeless tobacco: Current User    Types: Snuff  . Tobacco comment: tobaco pouches that produce juice  Vaping Use  . Vaping Use: Never used  Substance and Sexual Activity  . Alcohol use: Yes    Comment: socially, nothing every week  . Drug use: No  . Sexual activity: Yes    Partners: Female    Birth control/protection: None  Other Topics Concern  . Not on file  Social History Narrative  . Not on file   Social Determinants of Health   Financial Resource Strain: Not on file  Food Insecurity: Not on file  Transportation Needs: Not on file  Physical Activity: Not on file  Stress: Not on file  Social Connections: Not on file  Intimate Partner Violence: Not on file    FAMILY HISTORY: Family History  Problem Relation Age of Onset  . Heart disease Father   . Hypertension Father   . Prostate cancer Father   . Heart disease Mother   . Breast cancer Mother   . Colon cancer Neg Hx     ALLERGIES:  is allergic to penicillins.  MEDICATIONS:  Current Outpatient Medications  Medication Sig Dispense Refill  . benzonatate (TESSALON) 100 MG capsule Take 1 capsule (100 mg total) by mouth 2 (two) times daily as needed for cough. 20 capsule 0  . empagliflozin (JARDIANCE) 25 MG TABS tablet Take 1 tablet (25 mg total) by mouth daily before breakfast. 90 tablet 3  . fluticasone (FLONASE) 50 MCG/ACT nasal spray instill 1 to 2 sprays into each nostril once daily  0  . lisinopril (ZESTRIL) 40 MG tablet TAKE 1 TABLET BY MOUTH DAILY 90 tablet 0  . mirabegron ER (MYRBETRIQ) 50 MG TB24 tablet Take 1 tablet (50 mg total) by mouth daily. 30 tablet 3  . montelukast (SINGULAIR) 10 MG tablet TAKE 1 TABLET(10 MG) BY MOUTH DAILY AS NEEDED 90 tablet 1  . NEEDLE, DISP, 21 G (BD SAFETYGLIDE SHIELDED NEEDLE) 21G X 1-1/2" MISC Use 21guage needle to inject Testosterone Cypionate 48ml. 25 each 0  . omeprazole (PRILOSEC) 20 MG capsule Take 20 mg by mouth daily.     [name] test strip TEST EVERY DAY 100  strip 1  . SYRINGE-NEEDLE, DISP, 3 ML 18G X 1-1/2" 3 ML MISC Draw up 78ml Testosterone Cypionate with 18 gauge needle. 25 each 0  . testosterone cypionate (DEPOTESTOSTERONE CYPIONATE) 200 MG/ML injection INJECT 0.5 ML IN THE MUSCLE EVERY WEEK 10 mL 0  . atorvastatin (LIPITOR) 40 MG tablet Take 1 tablet (40 mg total) by mouth daily. (Patient not taking: Reported on 03/29/2021) 90 tablet 3  . ferrous sulfate 325 (65 FE) MG EC tablet Take 1 tablet (325 mg total) by mouth in the morning and at bedtime. (Patient not taking: Reported on 03/29/2021) 60 tablet 1  . finasteride (PROSCAR) 5 MG tablet Take 5 mg by mouth daily. (Patient not taking: Reported on 03/29/2021)    . hydrochlorothiazide (HYDRODIURIL) 12.5 MG tablet Take 1 tablet (12.5 mg total) by mouth daily. (Patient not taking: Reported on 03/29/2021) 90 tablet 3  . Lancets (ONETOUCH DELICA PLUS [id number]) MISC TEST EVERY DAY 100 each 1  . metFORMIN (GLUCOPHAGE-XR) 500 MG 24 hr tablet TAKE  4 TABLETS BY MOUTH EVERY MORNING (Patient not taking: Reported on 03/29/2021) 360 tablet 0  . tamsulosin (FLOMAX) 0.4 MG CAPS capsule TAKE 2 CAPSULES(0.8 MG) BY MOUTH DAILY (Patient not taking: Reported on 03/29/2021) 180 capsule 0   No current facility-administered medications for this visit.     PHYSICAL EXAMINATION: ECOG PERFORMANCE STATUS: 0 - Asymptomatic Vitals:   03/29/21 1358  BP: (!) 111/92  Pulse: 89  Resp: 16  Temp: 98.2 F (36.8 C)  SpO2: 96%   Filed Weights   03/29/21 1358  Weight: 222 lb 12.8 oz (101.1 kg)    Physical Exam Constitutional:      General: He is not in acute distress. HENT:     Head: Normocephalic and atraumatic.  Eyes:     General: No scleral icterus. Cardiovascular:     Rate and Rhythm: Normal rate and regular rhythm.     Heart sounds: Normal heart sounds.  Pulmonary:     Effort: Pulmonary effort is normal. No respiratory distress.     Breath sounds: No wheezing.  Abdominal:     General: Bowel sounds are normal.  There is no distension.     Palpations: Abdomen is soft.  Musculoskeletal:        General: No deformity. Normal range of motion.     Cervical back: Normal range of motion and neck supple.  Skin:    General: Skin is warm and dry.     Findings: No erythema or rash.  Neurological:     Mental Status: He is alert and oriented to person, place, and time. Mental status is at baseline.     Cranial Nerves: No cranial nerve deficit.     Coordination: Coordination normal.  Psychiatric:        Mood and Affect: Mood normal.     RADIOGRAPHIC STUDIES: I have personally reviewed the radiological images as listed and agreed with the findings in the report. No results found.   LABORATORY DATA:  I have reviewed the data as listed Lab Results  Component Value Date   WBC 7.2 03/29/2021   HGB 16.6 03/29/2021   HCT 52.1 (H) 03/29/2021   MCV 77.4 (L) 03/29/2021   PLT 313 03/29/2021   Recent Labs    04/13/20 0818 09/28/20 0942  NA 137 137  K 4.7 4.1  CL 99 101  CO2 20 17*  GLUCOSE 225* 228*  BUN 19 16  CREATININE 0.89 1.03  CALCIUM 9.5 9.0  GFRNONAA 90 76  GFRAA 104 88  PROT 6.6 6.4  ALBUMIN 4.2 4.3  AST 18 19  ALT 29 25  ALKPHOS 84 80  BILITOT 1.1 1.0   Iron/TIBC/Ferritin/ %Sat    Component Value Date/Time   IRON 105 03/29/2021 1339   TIBC 510 (H) 03/29/2021 1339   FERRITIN 18 (L) 03/29/2021 1339   IRONPCTSAT 21 03/29/2021 1339        ASSESSMENT & PLAN:   #Secondary erythrocytosis due to chronic testosterone use. Labs are reviewed and discussed with patient And hemoglobin 16.6, hematocrit 52.1 Proceed with phlebotomy today.  Microcytic anemia, iron panel showed iron deficiency.  Advised not to replace iron at this point. Chronic testosterone replacement therapy.  Follow-up with urology.  Continue lab MD phlebotomy in 4 months. CC note to Dr. Bernardo Heater.  Orders Placed This Encounter  Procedures  . CBC with Differential/Platelet    Standing Status:   Future     Standing Expiration Date:   03/29/2022    We spent sufficient time  to discuss many aspect of care, questions were answered to patient's satisfaction. The patient knows to call the clinic with any problems questions or concerns.  Earlie Server, MD, PhD 03/29/2021

## 2021-03-29 NOTE — Progress Notes (Signed)
Patient states no concerns at the moment. 

## 2021-04-01 ENCOUNTER — Other Ambulatory Visit: Payer: Self-pay

## 2021-04-01 ENCOUNTER — Ambulatory Visit: Payer: BC Managed Care – PPO | Admitting: Family Medicine

## 2021-04-01 ENCOUNTER — Encounter: Payer: Self-pay | Admitting: Family Medicine

## 2021-04-01 VITALS — BP 131/89 | HR 95 | Temp 98.4°F | Resp 16 | Ht 74.0 in | Wt 219.0 lb

## 2021-04-01 DIAGNOSIS — E785 Hyperlipidemia, unspecified: Secondary | ICD-10-CM

## 2021-04-01 DIAGNOSIS — I152 Hypertension secondary to endocrine disorders: Secondary | ICD-10-CM | POA: Diagnosis not present

## 2021-04-01 DIAGNOSIS — E1159 Type 2 diabetes mellitus with other circulatory complications: Secondary | ICD-10-CM

## 2021-04-01 DIAGNOSIS — R634 Abnormal weight loss: Secondary | ICD-10-CM | POA: Insufficient documentation

## 2021-04-01 DIAGNOSIS — E663 Overweight: Secondary | ICD-10-CM

## 2021-04-01 DIAGNOSIS — I8393 Asymptomatic varicose veins of bilateral lower extremities: Secondary | ICD-10-CM

## 2021-04-01 DIAGNOSIS — E1169 Type 2 diabetes mellitus with other specified complication: Secondary | ICD-10-CM | POA: Diagnosis not present

## 2021-04-01 DIAGNOSIS — Z1211 Encounter for screening for malignant neoplasm of colon: Secondary | ICD-10-CM

## 2021-04-01 MED ORDER — ATORVASTATIN CALCIUM 40 MG PO TABS: 40.0000 mg | ORAL_TABLET | Freq: Every day | ORAL | 3 refills | Status: DC

## 2021-04-01 NOTE — Progress Notes (Signed)
Established patient visit   Patient: Jonathan Fields   DOB: [birth year]   65 y.o. Male  MRN: [id number] Visit Date: 04/01/2021  Today's healthcare provider: Lavon Paganini, MD   Chief Complaint  Patient presents with  . Diabetes  . Hypertension  . Hyperlipidemia   Subjective    Diabetes Pertinent negatives for hypoglycemia include no dizziness or headaches. Pertinent negatives for diabetes include no chest pain and no fatigue.  Hypertension Pertinent negatives include no chest pain, headaches or shortness of breath.  Hyperlipidemia Pertinent negatives include no chest pain or shortness of breath.    Diabetes Mellitus Type II, Follow-up  Lab Results  Component Value Date   HGBA1C 6.8 (A) 09/28/2020   HGBA1C 8.0 (H) 06/26/2020   HGBA1C 9.1 (A) 06/05/2020   Wt Readings from Last 3 Encounters:  04/01/21 219 lb (99.3 kg)  03/29/21 222 lb 12.8 oz (101.1 kg)  02/22/21 227 lb 12.8 oz (103.3 kg)   Last seen for diabetes 3 months ago.  Management since then includes no changes. He reports fair compliance with treatment. He is not having side effects.  Symptoms: No fatigue No foot ulcerations  No appetite changes No nausea  No paresthesia of the feet  No polydipsia  No polyuria No visual disturbances   No vomiting     Home blood sugar records: not being checked  Episodes of hypoglycemia? No    Current insulin regiment: none Most Recent Eye Exam: UTD Current exercise: cardiovascular workout on exercise equipment and weightlifting Current diet habits: in general, a "healthy" diet    Pertinent Labs: Lab Results  Component Value Date   CHOL 214 (H) 09/28/2020   HDL 32 (L) 09/28/2020   LDLCALC 129 (H) 09/28/2020   TRIG 297 (H) 09/28/2020   CHOLHDL 6.7 (H) 09/28/2020   Lab Results  Component Value Date   NA 137 09/28/2020   K 4.1 09/28/2020   CREATININE 1.03 09/28/2020   GFRNONAA 76 09/28/2020   GFRAA 88 09/28/2020   GLUCOSE 228 (H) 09/28/2020      --------------------------------------------------------------------------------------------------- Hypertension, follow-up  BP Readings from Last 3 Encounters:  04/01/21 131/89  03/29/21 125/90  03/29/21 (!) 111/92   Wt Readings from Last 3 Encounters:  04/01/21 219 lb (99.3 kg)  03/29/21 222 lb 12.8 oz (101.1 kg)  02/22/21 227 lb 12.8 oz (103.3 kg)     He was last seen for hypertension 3 months ago.  BP at that visit was 138/76. Management since that visit includes no changes.  He reports fair compliance with treatment. He is not having side effects.  He is following a Regular diet. He is exercising. He does not smoke. He states that he started taking Jardiance instead of Metformin because he states that his symptoms were exacerbating. Use of agents associated with hypertension: none.   Outside blood pressures are not being checked. Symptoms: No chest pain No chest pressure  No palpitations No syncope  No dyspnea No orthopnea  No paroxysmal nocturnal dyspnea No lower extremity edema   Pertinent labs: Lab Results  Component Value Date   CHOL 214 (H) 09/28/2020   HDL 32 (L) 09/28/2020   LDLCALC 129 (H) 09/28/2020   TRIG 297 (H) 09/28/2020   CHOLHDL 6.7 (H) 09/28/2020   Lab Results  Component Value Date   NA 137 09/28/2020   K 4.1 09/28/2020   CREATININE 1.03 09/28/2020   GFRNONAA 76 09/28/2020   GFRAA 88 09/28/2020   GLUCOSE 228 (H) 09/28/2020  The 10-year ASCVD risk score Mikey Bussing DC Jr., et al., 2013) is: 37.6%   --------------------------------------------------------------------------------------------------- Lipid/Cholesterol, Follow-up  Last lipid panel Other pertinent labs  Lab Results  Component Value Date   CHOL 214 (H) 09/28/2020   HDL 32 (L) 09/28/2020   LDLCALC 129 (H) 09/28/2020   TRIG 297 (H) 09/28/2020   CHOLHDL 6.7 (H) 09/28/2020   Lab Results  Component Value Date   ALT 25 09/28/2020   AST 19 09/28/2020   PLT 313 03/29/2021      He was last seen for this 3 months ago.  Management since that visit includes no changes.  He reports excellent compliance with treatment. He is not having side effects.   Symptoms: No chest pain No chest pressure/discomfort  No dyspnea No lower extremity edema  No numbness or tingling of extremity No orthopnea  No palpitations No paroxysmal nocturnal dyspnea  No speech difficulty No syncope   Current diet: in general, a "healthy" diet   Current exercise: cardiovascular workout on exercise equipment  The 10-year ASCVD risk score Mikey Bussing DC Jr., et al., 2013) is: 37.6%    Urinary He states that he is still having trouble urinating. He is consistently taking the prescribed medication but he states that his difficulty urinating has not resolved.  Unintended Weight Loss He states that he has continually been losing weight since the start of the pandemic. He does not think he is eating enough. Before the start of the pandemic he weighed 250 lbs and his goal weight was between 230-235 lbs. He states that his appetite is fine but he is going to eat more.   Varicose Veins He saw [facility] in the past. He is requesting to go back.  ---------------------------------------------------------------------------------------------------  Patient Active Problem List   Diagnosis Date Noted  . Unintended weight loss 04/01/2021  . Acute recurrent maxillary sinusitis 12/31/2020  . Rotator cuff arthropathy of left shoulder 09/28/2020  . Erythrocytosis 07/04/2020  . Hyperlipidemia associated with type 2 diabetes mellitus ([place]) 04/09/2020  . Hypogonadism in male 02/03/2020  . Overweight 09/22/2019  . Hypertension associated with diabetes ([place]) 09/22/2019  . T2DM (type 2 diabetes mellitus) ([place]) 09/22/2019  . Gastroesophageal reflux disease without esophagitis 03/22/2019  . Sleep apnea 03/22/2019  . Post-traumatic osteoarthritis of right ankle 02/15/2019  . Cavovarus deformity of foot,  acquired, right 02/14/2019  . Peroneal tendinitis of lower leg, right 02/14/2019  . Acquired right hindfoot varus 11/17/2018  . BPH (benign prostatic hyperplasia) 02/19/2018  . Asymptomatic varicose veins of both lower extremities 04/09/2017  . Chronic venous insufficiency 04/09/2017  . Right ankle instability 02/21/2013   Social History   Tobacco Use  . Smoking status: Never Smoker  . Smokeless tobacco: Current User    Types: Snuff  . Tobacco comment: tobaco pouches that produce juice  Vaping Use  . Vaping Use: Never used  Substance Use Topics  . Alcohol use: Yes    Comment: socially, nothing every week  . Drug use: No   Allergies  Allergen Reactions  . Penicillins Hives    Has patient had a PCN reaction causing immediate rash, facial/tongue/throat swelling, SOB or lightheadedness with hypotension: yes Has patient had a PCN reaction causing severe rash involving mucus membranes or skin necrosis: no Has patient had a PCN reaction that required hospitalization: no Has patient had a PCN reaction occurring within the last 10 years: no If all of the above answers are "NO", then may proceed with Cephalosporin use.  Medications: Outpatient Medications Prior to Visit  Medication Sig  . empagliflozin (JARDIANCE) 25 MG TABS tablet Take 1 tablet (25 mg total) by mouth daily before breakfast.  . fluticasone (FLONASE) 50 MCG/ACT nasal spray instill 1 to 2 sprays into each nostril once daily  . lisinopril (ZESTRIL) 40 MG tablet TAKE 1 TABLET BY MOUTH DAILY  . mirabegron ER (MYRBETRIQ) 50 MG TB24 tablet Take 1 tablet (50 mg total) by mouth daily.  Marland Kitchen NEEDLE, DISP, 21 G (BD SAFETYGLIDE SHIELDED NEEDLE) 21G X 1-1/2" MISC Use 21guage needle to inject Testosterone Cypionate 24ml.  Marland Kitchen omeprazole (PRILOSEC) 20 MG capsule Take 20 mg by mouth daily.   . SYRINGE-NEEDLE, DISP, 3 ML 18G X 1-1/2" 3 ML MISC Draw up 46ml Testosterone Cypionate with 18 gauge needle.  . testosterone cypionate  (DEPOTESTOSTERONE CYPIONATE) 200 MG/ML injection INJECT 0.5 ML IN THE MUSCLE EVERY WEEK  . [DISCONTINUED] atorvastatin (LIPITOR) 40 MG tablet Take 1 tablet (40 mg total) by mouth daily. (Patient not taking: No sig reported)  . [DISCONTINUED] benzonatate (TESSALON) 100 MG capsule Take 1 capsule (100 mg total) by mouth 2 (two) times daily as needed for cough. (Patient not taking: Reported on 04/01/2021)  . [DISCONTINUED] ferrous sulfate 325 (65 FE) MG EC tablet Take 1 tablet (325 mg total) by mouth in the morning and at bedtime. (Patient not taking: Reported on 03/29/2021)  . [DISCONTINUED] finasteride (PROSCAR) 5 MG tablet Take 5 mg by mouth daily. (Patient not taking: No sig reported)  . [DISCONTINUED] hydrochlorothiazide (HYDRODIURIL) 12.5 MG tablet Take 1 tablet (12.5 mg total) by mouth daily. (Patient not taking: No sig reported)  . [DISCONTINUED] Lancets (ONETOUCH DELICA PLUS [id number]) [place] TEST EVERY DAY (Patient not taking: Reported on 04/01/2021)  . [DISCONTINUED] metFORMIN (GLUCOPHAGE-XR) 500 MG 24 hr tablet TAKE 4 TABLETS BY MOUTH EVERY MORNING (Patient not taking: Reported on 03/29/2021)  . [DISCONTINUED] montelukast (SINGULAIR) 10 MG tablet TAKE 1 TABLET(10 MG) BY MOUTH DAILY AS NEEDED (Patient not taking: Reported on 04/01/2021)  . [DISCONTINUED] ONETOUCH ULTRA test strip TEST EVERY DAY  . [DISCONTINUED] tamsulosin (FLOMAX) 0.4 MG CAPS capsule TAKE 2 CAPSULES(0.8 MG) BY MOUTH DAILY (Patient not taking: No sig reported)   No facility-administered medications prior to visit.    Review of Systems  Constitutional: Negative for chills, fatigue and fever.  HENT: Negative for congestion, ear pain, rhinorrhea, sinus pain and sore throat.   Respiratory: Negative for cough, shortness of breath and wheezing.   Cardiovascular: Negative for chest pain and leg swelling.  Gastrointestinal: Negative for abdominal pain, blood in stool, diarrhea, nausea and vomiting.  Genitourinary: Negative for dysuria,  flank pain, frequency and urgency.  Neurological: Negative for dizziness and headaches.        Objective    BP 131/89 (BP Location: Right Arm, Patient Position: Sitting, Cuff Size: Large)   Pulse 95   Temp 98.4 F (36.9 C) (Oral)   Resp 16   Ht 6\' 2"  (1.88 m)   Wt 219 lb (99.3 kg)   SpO2 97%   BMI 28.12 kg/m  BP Readings from Last 3 Encounters:  04/01/21 131/89  03/29/21 125/90  03/29/21 (!) 111/92   Wt Readings from Last 3 Encounters:  04/01/21 219 lb (99.3 kg)  03/29/21 222 lb 12.8 oz (101.1 kg)  02/22/21 227 lb 12.8 oz (103.3 kg)      Physical Exam Vitals reviewed.  Constitutional:      General: He is not in acute distress.    Appearance: Normal appearance.  He is not diaphoretic.  HENT:     Head: Normocephalic and atraumatic.  Eyes:     General: No scleral icterus.    Conjunctiva/sclera: Conjunctivae normal.  Cardiovascular:     Rate and Rhythm: Normal rate and regular rhythm.     Pulses: Normal pulses.     Heart sounds: Normal heart sounds. No murmur heard.   Pulmonary:     Effort: Pulmonary effort is normal. No respiratory distress.     Breath sounds: Normal breath sounds. No wheezing or rhonchi.  Abdominal:     General: There is no distension.     Palpations: Abdomen is soft.     Tenderness: There is no abdominal tenderness.  Musculoskeletal:     Cervical back: Neck supple.     Right lower leg: No edema.     Left lower leg: No edema.  Lymphadenopathy:     Cervical: No cervical adenopathy.  Skin:    General: Skin is warm and dry.     Capillary Refill: Capillary refill takes less than 2 seconds.     Findings: No rash.  Neurological:     Mental Status: He is alert and oriented to person, place, and time.     Cranial Nerves: No cranial nerve deficit.  Psychiatric:        Mood and Affect: Mood normal.        Behavior: Behavior normal.    +varicose veins of both LEs  No results found for any visits on 04/01/21.  Assessment & Plan     Problem  List Items Addressed This Visit      Cardiovascular and Mediastinum   Asymptomatic varicose veins of both lower extremities    Referral back to  [facility] for further intervention Continue compression stockings      Relevant Medications   atorvastatin (LIPITOR) 40 MG tablet   Other Relevant Orders   Ambulatory referral to Vascular Surgery   Hypertension associated with diabetes ([place])    Well controlled Continue current medications Recheck metabolic panel F/u in 6 months       Relevant Medications   atorvastatin (LIPITOR) 40 MG tablet   Other Relevant Orders   Comprehensive metabolic panel     Endocrine   T2DM (type 2 diabetes mellitus) ([place]) - Primary    Previously Well controlled Continue Jardiance - he stopped metformin (will consider resuming pending A1c) Recheck A1c - POCT had read error UTD on vaccines, eye exam, foot exam On ACEi resume Statin Discussed diet and exercise F/u in 6 months       Relevant Medications   atorvastatin (LIPITOR) 40 MG tablet   Other Relevant Orders   POCT glycosylated hemoglobin (Hb A1C)   Hemoglobin A1c   Hyperlipidemia associated with type 2 diabetes mellitus ([facility])    Previously uncontrolled Resume statin Repeat FLP and CMP Goal LDL < 70      Relevant Medications   atorvastatin (LIPITOR) 40 MG tablet   Other Relevant Orders   Comprehensive metabolic panel   Lipid panel     Other   Overweight    Discussed importance of healthy weight management Discussed diet and exercise       Unintended weight loss    UTD on PSA Cologuard ordered today Check CMP, TSH Recent CBC reviewed Increase protein intake Return precautions discussed      Relevant Orders   Comprehensive metabolic panel   TSH    Other Visit Diagnoses    Screening for colon cancer  Relevant Orders   Cologuard       Return in about 6 months (around 10/02/2021) for CPE.      Frederic Jericho [place],acting as a Education administrator for Lavon Paganini,  MD.,have documented all relevant documentation on the behalf of Lavon Paganini, MD,as directed by  Lavon Paganini, MD while in the presence of Lavon Paganini, MD.  I, Lavon Paganini, MD, have reviewed all documentation for this visit. The documentation on 04/01/21 for the exam, diagnosis, procedures, and orders are all accurate and complete.   Tyran Huser, Dionne Bucy, MD, MPH [facility]

## 2021-04-01 NOTE — Assessment & Plan Note (Signed)
Discussed importance of healthy weight management Discussed diet and exercise  

## 2021-04-01 NOTE — Patient Instructions (Addendum)
Resume atorvastatin   Diabetes Mellitus and Nutrition, Adult When you have diabetes, or diabetes mellitus, it is very important to have healthy eating habits because your blood sugar (glucose) levels are greatly affected by what you eat and drink. Eating healthy foods in the right amounts, at about the same times every day, can help you:  Control your blood glucose.  Lower your risk of heart disease.  Improve your blood pressure.  Reach or maintain a healthy weight. What can affect my meal plan? Every person with diabetes is different, and each person has different needs for a meal plan. Your health care provider may recommend that you work with a dietitian to make a meal plan that is best for you. Your meal plan may vary depending on factors such as:  The calories you need.  The medicines you take.  Your weight.  Your blood glucose, blood pressure, and cholesterol levels.  Your activity level.  Other health conditions you have, such as heart or kidney disease. How do carbohydrates affect me? Carbohydrates, also called carbs, affect your blood glucose level more than any other type of food. Eating carbs naturally raises the amount of glucose in your blood. Carb counting is a method for keeping track of how many carbs you eat. Counting carbs is important to keep your blood glucose at a healthy level, especially if you use insulin or take certain oral diabetes medicines. It is important to know how many carbs you can safely have in each meal. This is different for every person. Your dietitian can help you calculate how many carbs you should have at each meal and for each snack. How does alcohol affect me? Alcohol can cause a sudden decrease in blood glucose (hypoglycemia), especially if you use insulin or take certain oral diabetes medicines. Hypoglycemia can be a life-threatening condition. Symptoms of hypoglycemia, such as sleepiness, dizziness, and confusion, are similar to symptoms  of having too much alcohol.  Do not drink alcohol if: ? Your health care provider tells you not to drink. ? You are pregnant, may be pregnant, or are planning to become pregnant.  If you drink alcohol: ? Do not drink on an empty stomach. ? Limit how much you use to:  0-1 drink a day for women.  0-2 drinks a day for men. ? Be aware of how much alcohol is in your drink. In the [place] one drink equals one 12 oz bottle of beer (355 mL), one 5 oz glass of wine (148 mL), or one 1 oz glass of hard liquor (44 mL). ? Keep yourself hydrated with water, diet soda, or unsweetened iced tea.  Keep in mind that regular soda, juice, and other mixers may contain a lot of sugar and must be counted as carbs. What are tips for following this plan? Reading food labels  Start by checking the serving size on the "Nutrition Facts" label of packaged foods and drinks. The amount of calories, carbs, fats, and other nutrients listed on the label is based on one serving of the item. Many items contain more than one serving per package.  Check the total grams (g) of carbs in one serving. You can calculate the number of servings of carbs in one serving by dividing the total carbs by 15. For example, if a food has 30 g of total carbs per serving, it would be equal to 2 servings of carbs.  Check the number of grams (g) of saturated fats and trans fats in one serving.  Choose foods that have a low amount or none of these fats.  Check the number of milligrams (mg) of salt (sodium) in one serving. Most people should limit total sodium intake to less than 2,300 mg per day.  Always check the nutrition information of foods labeled as "low-fat" or "nonfat." These foods may be higher in added sugar or refined carbs and should be avoided.  Talk to your dietitian to identify your daily goals for nutrients listed on the label. Shopping  Avoid buying canned, pre-made, or processed foods. These foods tend to be high in fat,  sodium, and added sugar.  Shop around the outside edge of the grocery store. This is where you will most often find fresh fruits and vegetables, bulk grains, fresh meats, and fresh dairy. Cooking  Use low-heat cooking methods, such as baking, instead of high-heat cooking methods like deep frying.  Cook using healthy oils, such as olive, canola, or sunflower oil.  Avoid cooking with butter, cream, or high-fat meats. Meal planning  Eat meals and snacks regularly, preferably at the same times every day. Avoid going long periods of time without eating.  Eat foods that are high in fiber, such as fresh fruits, vegetables, beans, and whole grains. Talk with your dietitian about how many servings of carbs you can eat at each meal.  Eat 4-6 oz (112-168 g) of lean protein each day, such as lean meat, chicken, fish, eggs, or tofu. One ounce (oz) of lean protein is equal to: ? 1 oz (28 g) of meat, chicken, or fish. ? 1 egg. ?  cup (62 g) of tofu.  Eat some foods each day that contain healthy fats, such as avocado, nuts, seeds, and fish.   What foods should I eat? Fruits Berries. Apples. Oranges. Peaches. Apricots. Plums. Grapes. Mango. Papaya. Pomegranate. Kiwi. Cherries. Vegetables Lettuce. Spinach. Leafy greens, including kale, chard, collard greens, and mustard greens. Beets. Cauliflower. Cabbage. Broccoli. Carrots. Green beans. Tomatoes. Peppers. Onions. Cucumbers. Brussels sprouts. Grains Whole grains, such as whole-wheat or whole-grain bread, crackers, tortillas, cereal, and pasta. Unsweetened oatmeal. Quinoa. Brown or wild rice. Meats and other proteins Seafood. Poultry without skin. Lean cuts of poultry and beef. Tofu. Nuts. Seeds. Dairy Low-fat or fat-free dairy products such as milk, yogurt, and cheese. The items listed above may not be a complete list of foods and beverages you can eat. Contact a dietitian for more information. What foods should I avoid? Fruits Fruits canned with  syrup. Vegetables Canned vegetables. Frozen vegetables with butter or cream sauce. Grains Refined white flour and flour products such as bread, pasta, snack foods, and cereals. Avoid all processed foods. Meats and other proteins Fatty cuts of meat. Poultry with skin. Breaded or fried meats. Processed meat. Avoid saturated fats. Dairy Full-fat yogurt, cheese, or milk. Beverages Sweetened drinks, such as soda or iced tea. The items listed above may not be a complete list of foods and beverages you should avoid. Contact a dietitian for more information. Questions to ask a health care provider  Do I need to meet with a diabetes educator?  Do I need to meet with a dietitian?  What number can I call if I have questions?  When are the best times to check my blood glucose? Where to find more information:  American Diabetes Association: diabetes.org  Academy of Nutrition and Dietetics: www.eatright.[employer] of Diabetes and Digestive and Kidney Diseases: [employer]  Association of Diabetes Care and Education Specialists: www.diabeteseducator.org Summary  It is important  to have healthy eating habits because your blood sugar (glucose) levels are greatly affected by what you eat and drink.  A healthy meal plan will help you control your blood glucose and maintain a healthy lifestyle.  Your health care provider may recommend that you work with a dietitian to make a meal plan that is best for you.  Keep in mind that carbohydrates (carbs) and alcohol have immediate effects on your blood glucose levels. It is important to count carbs and to use alcohol carefully. This information is not intended to replace advice given to you by your health care provider. Make sure you discuss any questions you have with your health care provider. Document Revised: 10/25/2019 Document Reviewed: 10/25/2019 Elsevier Patient Education  2021 [place]. Hypertension, Adult High blood  pressure (hypertension) is when the force of blood pumping through the arteries is too strong. The arteries are the blood vessels that carry blood from the heart throughout the body. Hypertension forces the heart to work harder to pump blood and may cause arteries to become narrow or stiff. Untreated or uncontrolled hypertension can cause a heart attack, heart failure, a stroke, kidney disease, and other problems. A blood pressure reading consists of a higher number over a lower number. Ideally, your blood pressure should be below 120/80. The first ("top") number is called the systolic pressure. It is a measure of the pressure in your arteries as your heart beats. The second ("bottom") number is called the diastolic pressure. It is a measure of the pressure in your arteries as the heart relaxes. What are the causes? The exact cause of this condition is not known. There are some conditions that result in or are related to high blood pressure. What increases the risk? Some risk factors for high blood pressure are under your control. The following factors may make you more likely to develop this condition:  Smoking.  Having type 2 diabetes mellitus, high cholesterol, or both.  Not getting enough exercise or physical activity.  Being overweight.  Having too much fat, sugar, calories, or salt (sodium) in your diet.  Drinking too much alcohol. Some risk factors for high blood pressure may be difficult or impossible to change. Some of these factors include:  Having chronic kidney disease.  Having a family history of high blood pressure.  Age. Risk increases with age.  Race. You may be at higher risk if you are African American.  Gender. Men are at higher risk than women before age 58. After age 50, women are at higher risk than men.  Having obstructive sleep apnea.  Stress. What are the signs or symptoms? High blood pressure may not cause symptoms. Very high blood pressure (hypertensive  crisis) may cause:  Headache.  Anxiety.  Shortness of breath.  Nosebleed.  Nausea and vomiting.  Vision changes.  Severe chest pain.  Seizures. How is this diagnosed? This condition is diagnosed by measuring your blood pressure while you are seated, with your arm resting on a flat surface, your legs uncrossed, and your feet flat on the floor. The cuff of the blood pressure monitor will be placed directly against the skin of your upper arm at the level of your heart. It should be measured at least twice using the same arm. Certain conditions can cause a difference in blood pressure between your right and left arms. Certain factors can cause blood pressure readings to be lower or higher than normal for a short period of time:  When your blood pressure  is higher when you are in a health care provider's office than when you are at home, this is called white coat hypertension. Most people with this condition do not need medicines.  When your blood pressure is higher at home than when you are in a health care provider's office, this is called masked hypertension. Most people with this condition may need medicines to control blood pressure. If you have a high blood pressure reading during one visit or you have normal blood pressure with other risk factors, you may be asked to:  Return on a different day to have your blood pressure checked again.  Monitor your blood pressure at home for 1 week or longer. If you are diagnosed with hypertension, you may have other blood or imaging tests to help your health care provider understand your overall risk for other conditions. How is this treated? This condition is treated by making healthy lifestyle changes, such as eating healthy foods, exercising more, and reducing your alcohol intake. Your health care provider may prescribe medicine if lifestyle changes are not enough to get your blood pressure under control, and if:  Your systolic blood pressure  is above 130.  Your diastolic blood pressure is above 80. Your personal target blood pressure may vary depending on your medical conditions, your age, and other factors. Follow these instructions at home: Eating and drinking  Eat a diet that is high in fiber and potassium, and low in sodium, added sugar, and fat. An example eating plan is called the DASH (Dietary Approaches to Stop Hypertension) diet. To eat this way: ? Eat plenty of fresh fruits and vegetables. Try to fill one half of your plate at each meal with fruits and vegetables. ? Eat whole grains, such as whole-wheat pasta, brown rice, or whole-grain bread. Fill about one fourth of your plate with whole grains. ? Eat or drink low-fat dairy products, such as skim milk or low-fat yogurt. ? Avoid fatty cuts of meat, processed or cured meats, and poultry with skin. Fill about one fourth of your plate with lean proteins, such as fish, chicken without skin, beans, eggs, or tofu. ? Avoid pre-made and processed foods. These tend to be higher in sodium, added sugar, and fat.  Reduce your daily sodium intake. Most people with hypertension should eat less than 1,500 mg of sodium a day.  Do not drink alcohol if: ? Your health care provider tells you not to drink. ? You are pregnant, may be pregnant, or are planning to become pregnant.  If you drink alcohol: ? Limit how much you use to:  0-1 drink a day for women.  0-2 drinks a day for men. ? Be aware of how much alcohol is in your drink. In the [place] one drink equals one 12 oz bottle of beer (355 mL), one 5 oz glass of wine (148 mL), or one 1 oz glass of hard liquor (44 mL).   Lifestyle  Work with your health care provider to maintain a healthy body weight or to lose weight. Ask what an ideal weight is for you.  Get at least 30 minutes of exercise most days of the week. Activities may include walking, swimming, or biking.  Include exercise to strengthen your muscles (resistance  exercise), such as Pilates or lifting weights, as part of your weekly exercise routine. Try to do these types of exercises for 30 minutes at least 3 days a week.  Do not use any products that contain nicotine or tobacco, such  as cigarettes, e-cigarettes, and chewing tobacco. If you need help quitting, ask your health care provider.  Monitor your blood pressure at home as told by your health care provider.  Keep all follow-up visits as told by your health care provider. This is important.   Medicines  Take over-the-counter and prescription medicines only as told by your health care provider. Follow directions carefully. Blood pressure medicines must be taken as prescribed.  Do not skip doses of blood pressure medicine. Doing this puts you at risk for problems and can make the medicine less effective.  Ask your health care provider about side effects or reactions to medicines that you should watch for. Contact a health care provider if you:  Think you are having a reaction to a medicine you are taking.  Have headaches that keep coming back (recurring).  Feel dizzy.  Have swelling in your ankles.  Have trouble with your vision. Get help right away if you:  Develop a severe headache or confusion.  Have unusual weakness or numbness.  Feel faint.  Have severe pain in your chest or abdomen.  Vomit repeatedly.  Have trouble breathing. Summary  Hypertension is when the force of blood pumping through your arteries is too strong. If this condition is not controlled, it may put you at risk for serious complications.  Your personal target blood pressure may vary depending on your medical conditions, your age, and other factors. For most people, a normal blood pressure is less than 120/80.  Hypertension is treated with lifestyle changes, medicines, or a combination of both. Lifestyle changes include losing weight, eating a healthy, low-sodium diet, exercising more, and limiting  alcohol. This information is not intended to replace advice given to you by your health care provider. Make sure you discuss any questions you have with your health care provider. Document Revised: 07/28/2018 Document Reviewed: 07/28/2018 Elsevier Patient Education  2021 [employer].

## 2021-04-01 NOTE — Assessment & Plan Note (Signed)
Referral back to [facility] for further intervention Continue compression stockings

## 2021-04-01 NOTE — Assessment & Plan Note (Signed)
Previously Well controlled Continue Jardiance - he stopped metformin (will consider resuming pending A1c) Recheck A1c - POCT had read error UTD on vaccines, eye exam, foot exam On ACEi resume Statin Discussed diet and exercise F/u in 6 months

## 2021-04-01 NOTE — Assessment & Plan Note (Signed)
Well controlled Continue current medications Recheck metabolic panel F/u in 6 months  

## 2021-04-01 NOTE — Assessment & Plan Note (Signed)
UTD on PSA Cologuard ordered today Check CMP, TSH Recent CBC reviewed Increase protein intake Return precautions discussed

## 2021-04-01 NOTE — Assessment & Plan Note (Addendum)
Previously uncontrolled Resume statin Repeat FLP and CMP Goal LDL < 70

## 2021-04-02 LAB — COMPREHENSIVE METABOLIC PANEL
ALT: 88 IU/L — ABNORMAL HIGH (ref 0–44)
AST: 23 IU/L (ref 0–40)
Albumin/Globulin Ratio: 2.1 (ref 1.2–2.2)
Albumin: 4.4 g/dL (ref 3.8–4.8)
Alkaline Phosphatase: 74 IU/L (ref 44–121)
BUN/Creatinine Ratio: 20 (ref 10–24)
BUN: 23 mg/dL (ref 8–27)
Bilirubin Total: 0.5 mg/dL (ref 0.0–1.2)
CO2: 18 mmol/L — ABNORMAL LOW (ref 20–29)
Calcium: 9.3 mg/dL (ref 8.6–10.2)
Chloride: 103 mmol/L (ref 96–106)
Creatinine, Ser: 1.13 mg/dL (ref 0.76–1.27)
Globulin, Total: 2.1 g/dL (ref 1.5–4.5)
Glucose: 157 mg/dL — ABNORMAL HIGH (ref 65–99)
Potassium: 4.7 mmol/L (ref 3.5–5.2)
Sodium: 139 mmol/L (ref 134–144)
Total Protein: 6.5 g/dL (ref 6.0–8.5)
eGFR: 72 mL/min/{1.73_m2} (ref >59)

## 2021-04-02 LAB — LIPID PANEL
Chol/HDL Ratio: 6.3 ratio — ABNORMAL HIGH (ref 0.0–5.0)
Cholesterol, Total: 264 mg/dL — ABNORMAL HIGH (ref 100–199)
HDL: 42 mg/dL (ref >39)
LDL Chol Calc (NIH): 167 mg/dL — ABNORMAL HIGH (ref 0–99)
Triglycerides: 290 mg/dL — ABNORMAL HIGH (ref 0–149)
VLDL Cholesterol Cal: 55 mg/dL — ABNORMAL HIGH (ref 5–40)

## 2021-04-02 LAB — TSH: TSH: 2.28 u[IU]/mL (ref 0.450–4.500)

## 2021-04-02 LAB — HEMOGLOBIN A1C
Est. average glucose Bld gHb Est-mCnc: 177 mg/dL
Hgb A1c MFr Bld: 7.8 % — ABNORMAL HIGH (ref 4.8–5.6)

## 2021-04-03 DIAGNOSIS — G5603 Carpal tunnel syndrome, bilateral upper limbs: Secondary | ICD-10-CM | POA: Diagnosis not present

## 2021-04-15 ENCOUNTER — Encounter (INDEPENDENT_AMBULATORY_CARE_PROVIDER_SITE_OTHER): Payer: Self-pay | Admitting: Vascular Surgery

## 2021-04-15 ENCOUNTER — Ambulatory Visit (INDEPENDENT_AMBULATORY_CARE_PROVIDER_SITE_OTHER): Payer: BC Managed Care – PPO | Admitting: Vascular Surgery

## 2021-04-15 ENCOUNTER — Other Ambulatory Visit: Payer: Self-pay

## 2021-04-15 VITALS — BP 140/96 | HR 91 | Ht 74.0 in | Wt 224.0 lb

## 2021-04-15 DIAGNOSIS — I8311 Varicose veins of right lower extremity with inflammation: Secondary | ICD-10-CM

## 2021-04-15 DIAGNOSIS — E1159 Type 2 diabetes mellitus with other circulatory complications: Secondary | ICD-10-CM

## 2021-04-15 DIAGNOSIS — E785 Hyperlipidemia, unspecified: Secondary | ICD-10-CM

## 2021-04-15 DIAGNOSIS — E1169 Type 2 diabetes mellitus with other specified complication: Secondary | ICD-10-CM

## 2021-04-15 DIAGNOSIS — I872 Venous insufficiency (chronic) (peripheral): Secondary | ICD-10-CM

## 2021-04-15 DIAGNOSIS — I152 Hypertension secondary to endocrine disorders: Secondary | ICD-10-CM

## 2021-04-15 DIAGNOSIS — I8312 Varicose veins of left lower extremity with inflammation: Secondary | ICD-10-CM

## 2021-04-16 ENCOUNTER — Encounter (INDEPENDENT_AMBULATORY_CARE_PROVIDER_SITE_OTHER): Payer: Self-pay | Admitting: Vascular Surgery

## 2021-04-16 NOTE — Progress Notes (Signed)
MRN : [id number]  Jonathan Fields is a 65 y.o. (07/15/1956) male who presents with chief complaint of  Chief Complaint  Patient presents with  . New Patient (Initial Visit)    New patient bacigalupo asymptomatic VV  .  History of Present Illness:   The patient is seen for evaluation of symptomatic varicose veins. The patient relates burning and stinging which worsened steadily throughout the course of the day, particularly with standing. The patient also notes an aching and throbbing pain over the varicosities, particularly with prolonged dependent positions. The symptoms are significantly improved with elevation.  The patient also notes that during hot weather the symptoms are greatly intensified. The patient states the pain from the varicose veins interferes with work, daily exercise, shopping and household maintenance. At this point, the symptoms are persistent and severe enough that they're having a negative impact on lifestyle and are interfering with daily activities.  There is no history of DVT, PE or superficial thrombophlebitis. There is no history of ulceration or hemorrhage. The patient denies a significant family history of varicose veins.  The patient has not worn graduated compression in the past. At the present time the patient has not been using over-the-counter analgesics. There is a history of prior surgical intervention and sclerotherapy.    Current Meds  Medication Sig  . atorvastatin (LIPITOR) 40 MG tablet Take 1 tablet (40 mg total) by mouth daily.  . empagliflozin (JARDIANCE) 25 MG TABS tablet Take 1 tablet (25 mg total) by mouth daily before breakfast.  . erythromycin ophthalmic ointment Place along incisions four times daily  . fluticasone (FLONASE) 50 MCG/ACT nasal spray instill 1 to 2 sprays into each nostril once daily  . lisinopril (ZESTRIL) 40 MG tablet TAKE 1 TABLET BY MOUTH DAILY  . mirabegron ER (MYRBETRIQ) 50 MG TB24 tablet Take 1 tablet (50 mg  total) by mouth daily.  Marland Kitchen NEEDLE, DISP, 21 G (BD SAFETYGLIDE SHIELDED NEEDLE) 21G X 1-1/2" MISC Use 21guage needle to inject Testosterone Cypionate 12ml.  Marland Kitchen omeprazole (PRILOSEC) 20 MG capsule Take 20 mg by mouth daily.   . SYRINGE-NEEDLE, DISP, 3 ML 18G X 1-1/2" 3 ML MISC Draw up 76ml Testosterone Cypionate with 18 gauge needle.  . testosterone cypionate (DEPOTESTOSTERONE CYPIONATE) 200 MG/ML injection INJECT 0.5 ML IN THE MUSCLE EVERY WEEK    Past Medical History:  Diagnosis Date  . BPH (benign prostatic hyperplasia)   . Bronchitis   . Complication of anesthesia    hard to wake up  . DM (diabetes mellitus), type 2 ([place])   . DVT (deep venous thrombosis) ([place])    post op  . ED (erectile dysfunction)   . Erythrocytosis 07/04/2020  . GERD (gastroesophageal reflux disease)   . Hyperlipemia   . Hypertension   . Hypogonadism in male   . Nocturia   . Right ankle instability   . Sleep apnea   . TB lung, latent   . Urinary frequency     Past Surgical History:  Procedure Laterality Date  . ANKLE SURGERY Left    torn ligament  . CARPAL TUNNEL RELEASE Right 08/2019  . COLONOSCOPY  2009  . KNEE ARTHROSCOPY Left   . right ankle ruptured tendon surgery  2008  . TRANSURETHRAL RESECTION OF PROSTATE N/A 02/19/2018   Procedure: TRANSURETHRAL RESECTION OF THE PROSTATE (TURP);  Surgeon: [name], MD;  Location: [facility] ORS;  Service: Urology;  Laterality: N/A;    Social History Social History   Tobacco Use  .  Smoking status: Never Smoker  . Smokeless tobacco: Current User    Types: Snuff  . Tobacco comment: tobaco pouches that produce juice  Vaping Use  . Vaping Use: Never used  Substance Use Topics  . Alcohol use: Yes    Comment: socially, nothing every week  . Drug use: No    Family History Family History  Problem Relation Age of Onset  . Heart disease Father   . Hypertension Father   . Prostate cancer Father   . Heart disease Mother   . Breast cancer Mother   .  Colon cancer Neg Hx     Allergies  Allergen Reactions  . Penicillins Hives    Has patient had a PCN reaction causing immediate rash, facial/tongue/throat swelling, SOB or lightheadedness with hypotension: yes Has patient had a PCN reaction causing severe rash involving mucus membranes or skin necrosis: no Has patient had a PCN reaction that required hospitalization: no Has patient had a PCN reaction occurring within the last 10 years: no If all of the above answers are "NO", then may proceed with Cephalosporin use.      REVIEW OF SYSTEMS (Negative unless checked)  Constitutional: [] Weight loss  [] Fever  [] Chills Cardiac: [] Chest pain   [] Chest pressure   [] Palpitations   [] Shortness of breath when laying flat   [] Shortness of breath with exertion. Vascular:  [] Pain in legs with walking   [] Pain in legs at rest  [] History of DVT   [] Phlebitis   [x] Swelling in legs   [x] Varicose veins   [] Non-healing ulcers Pulmonary:   [] Uses home oxygen   [] Productive cough   [] Hemoptysis   [] Wheeze  [] COPD   [] Asthma Neurologic:  [] Dizziness   [] Seizures   [] History of stroke   [] History of TIA  [] Aphasia   [] Vissual changes   [] Weakness or numbness in arm   [] Weakness or numbness in leg Musculoskeletal:   [] Joint swelling   [] Joint pain   [] Low back pain Hematologic:  [] Easy bruising  [] Easy bleeding   [] Hypercoagulable state   [] Anemic Gastrointestinal:  [] Diarrhea   [] Vomiting  [x] Gastroesophageal reflux/heartburn   [] Difficulty swallowing. Genitourinary:  [] Chronic kidney disease   [] Difficult urination  [] Frequent urination   [] Blood in urine Skin:  [] Rashes   [] Ulcers  Psychological:  [] History of anxiety   []  History of major depression.  Physical Examination  Vitals:   04/15/21 1517  BP: (!) 140/96  Pulse: 91  Weight: 224 lb (101.6 kg)  Height: 6\' 2"  (1.88 m)   Body mass index is 28.76 kg/m. Gen: WD/WN, NAD Head: [place]/AT, No temporalis wasting.  Ear/Nose/Throat: Hearing grossly  intact, nares w/o erythema or drainage Eyes: PER, EOMI, sclera nonicteric.  Neck: Supple, no large masses.   Pulmonary:  Good air movement, no audible wheezing bilaterally, no use of accessory muscles.  Cardiac: RRR, no JVD Vascular: Large varicosities present extensively greater than 10 mm bilaterally.  Mild venous stasis changes to the legs bilaterally.  2+ soft pitting edema Vessel Right Left  Radial Palpable Palpable  PT Palpable Palpable  DP Palpable Palpable  Gastrointestinal: Non-distended. No guarding/no peritoneal signs.  Musculoskeletal: M/S 5/5 throughout.  No deformity or atrophy.  Neurologic: CN 2-12 intact. Symmetrical.  Speech is fluent. Motor exam as listed above. Psychiatric: Judgment intact, Mood & affect appropriate for pt's clinical situation. Dermatologic: Venous rashes no ulcers noted.  No changes consistent with cellulitis.   CBC Lab Results  Component Value Date   WBC 7.2 03/29/2021   HGB 16.6  03/29/2021   HCT 52.1 (H) 03/29/2021   MCV 77.4 (L) 03/29/2021   PLT 313 03/29/2021    BMET    Component Value Date/Time   NA 139 04/01/2021 1636   K 4.7 04/01/2021 1636   CL 103 04/01/2021 1636   CO2 18 (L) 04/01/2021 1636   GLUCOSE 157 (H) 04/01/2021 1636   GLUCOSE 429 (H) 03/28/2020 1050   BUN 23 04/01/2021 1636   BUN 25 02/09/2019 0000   CREATININE 1.13 04/01/2021 1636   CALCIUM 9.3 04/01/2021 1636   GFRNONAA 76 09/28/2020 0942   GFRAA 88 09/28/2020 0942   Estimated Creatinine Clearance: 81.9 mL/min (by C-G formula based on SCr of 1.13 mg/dL).  COAG No results found for: INR, PROTIME  Radiology No results found.   Assessment/Plan 1. Varicose veins of both lower extremities with inflammation  Recommend:  The patient has large symptomatic varicose veins that are painful and associated with swelling.  I have had a long discussion with the patient regarding  varicose veins and why they cause symptoms.  Patient will begin wearing graduated  compression stockings class 1 on a daily basis, beginning first thing in the morning and removing them in the evening. The patient is instructed specifically not to sleep in the stockings.    The patient  will also begin using over-the-counter analgesics such as Motrin 600 mg po TID to help control the symptoms.    In addition, behavioral modification including elevation during the day will be initiated.    Pending the results of these changes the  patient will be reevaluated in three months.   An  ultrasound of the venous system will be obtained.   Further plans will be based on the ultrasound results and whether conservative therapies are successful at eliminating the pain and swelling.   2. Chronic venous insufficiency No surgery or intervention at this point in time.    I have had a long discussion with the patient regarding venous insufficiency and why it  causes symptoms. I have discussed with the patient the chronic skin changes that accompany venous insufficiency and the long term sequela such as infection and ulceration.  Patient will begin wearing graduated compression stockings class 1 (20-30 mmHg) or compression wraps on a daily basis a prescription was given. The patient will put the stockings on first thing in the morning and removing them in the evening. The patient is instructed specifically not to sleep in the stockings.    In addition, behavioral modification including several periods of elevation of the lower extremities during the day will be continued. I have demonstrated that proper elevation is a position with the ankles at heart level.  The patient is instructed to begin routine exercise, especially walking on a daily basis  Patient should undergo duplex ultrasound of the venous system to ensure that DVT or reflux is not present.  Following the review of the ultrasound the patient will follow up in 2-3 months to reassess the degree of swelling and the control that  graduated compression stockings or compression wraps  is offering.   The patient can be assessed for a Lymph Pump at that time  3. Hypertension associated with diabetes (HCC) Continue antihypertensive medications as already ordered, these medications have been reviewed and there are no changes at this time.   4. Type 2 diabetes mellitus with other specified complication, without long-term current use of insulin (HCC) Continue hypoglycemic medications as already ordered, these medications have been reviewed and there are  no changes at this time.  Hgb A1C to be monitored as already arranged by primary service   5. Hyperlipidemia associated with type 2 diabetes mellitus ([place]) Continue statin as ordered and reviewed, no changes at this time     Hortencia Pilar, MD  04/16/2021 1:07 PM

## 2021-05-09 ENCOUNTER — Other Ambulatory Visit (INDEPENDENT_AMBULATORY_CARE_PROVIDER_SITE_OTHER): Payer: Self-pay | Admitting: Vascular Surgery

## 2021-05-09 DIAGNOSIS — I8311 Varicose veins of right lower extremity with inflammation: Secondary | ICD-10-CM

## 2021-05-10 DIAGNOSIS — J0100 Acute maxillary sinusitis, unspecified: Secondary | ICD-10-CM | POA: Diagnosis not present

## 2021-05-10 DIAGNOSIS — Z20822 Contact with and (suspected) exposure to covid-19: Secondary | ICD-10-CM | POA: Diagnosis not present

## 2021-05-13 ENCOUNTER — Other Ambulatory Visit: Payer: Self-pay

## 2021-05-13 ENCOUNTER — Ambulatory Visit (INDEPENDENT_AMBULATORY_CARE_PROVIDER_SITE_OTHER): Payer: BC Managed Care – PPO

## 2021-05-13 ENCOUNTER — Ambulatory Visit (INDEPENDENT_AMBULATORY_CARE_PROVIDER_SITE_OTHER): Payer: BC Managed Care – PPO | Admitting: Vascular Surgery

## 2021-05-13 VITALS — BP 155/103 | HR 86 | Ht 74.0 in | Wt 220.0 lb

## 2021-05-13 DIAGNOSIS — I152 Hypertension secondary to endocrine disorders: Secondary | ICD-10-CM

## 2021-05-13 DIAGNOSIS — I872 Venous insufficiency (chronic) (peripheral): Secondary | ICD-10-CM

## 2021-05-13 DIAGNOSIS — I8311 Varicose veins of right lower extremity with inflammation: Secondary | ICD-10-CM

## 2021-05-13 DIAGNOSIS — E1169 Type 2 diabetes mellitus with other specified complication: Secondary | ICD-10-CM

## 2021-05-13 DIAGNOSIS — E1159 Type 2 diabetes mellitus with other circulatory complications: Secondary | ICD-10-CM

## 2021-05-13 DIAGNOSIS — I8312 Varicose veins of left lower extremity with inflammation: Secondary | ICD-10-CM

## 2021-05-13 DIAGNOSIS — E785 Hyperlipidemia, unspecified: Secondary | ICD-10-CM

## 2021-05-14 ENCOUNTER — Ambulatory Visit (INDEPENDENT_AMBULATORY_CARE_PROVIDER_SITE_OTHER): Payer: BC Managed Care – PPO | Admitting: Podiatry

## 2021-05-14 DIAGNOSIS — B070 Plantar wart: Secondary | ICD-10-CM | POA: Diagnosis not present

## 2021-05-14 NOTE — Progress Notes (Signed)
   Subjective: 65 y.o. male presenting today a for follow-up evaluation of a symptomatic skin lesion to the plantar aspect of the left forefoot.  It is very painful and the pain and sensitivity has slowly returned.  He has been keeping it covered for relief.  He presents for further treatment and evaluation   Past Medical History:  Diagnosis Date   BPH (benign prostatic hyperplasia)    Bronchitis    Complication of anesthesia    hard to wake up   DM (diabetes mellitus), type 2 ([place])    DVT (deep venous thrombosis) ([place])    post op   ED (erectile dysfunction)    Erythrocytosis 07/04/2020   GERD (gastroesophageal reflux disease)    Hyperlipemia    Hypertension    Hypogonadism in male    Nocturia    Right ankle instability    Sleep apnea    TB lung, latent    Urinary frequency     Objective: Physical Exam General: The patient is alert and oriented x3 in no acute distress.   Dermatology: Hyperkeratotic skin lesion(s) noted to the plantar aspect of the left foot approximately 1 cm in diameter. Pinpoint bleeding noted upon debridement. Skin is warm, dry and supple bilateral lower extremities. Negative for open lesions or macerations.   Vascular: Palpable pedal pulses bilaterally. No edema or erythema noted. Capillary refill within normal limits.   Neurological: Epicritic and protective threshold grossly intact bilaterally.    Musculoskeletal Exam: Pain on palpation to the noted skin lesion(s).  Range of motion within normal limits to all pedal and ankle joints bilateral. Muscle strength 5/5 in all groups bilateral.    Assessment: #1 plantar wart left foot   Plan of Care:  #1 Patient was evaluated. #2 Excisional debridement of the plantar wart lesion(s) was performed using a chisel blade.  Salicylic acid was applied and the lesion(s) was dressed with a dry sterile dressing. #3 salicylic acid (salinocaine) provided for the patient to apply daily  #4 return to clinic 4  weeks  Edrick Kins, DPM [facility]  Dr. Edrick Kins, DPM    [address], [place] [address]                Office [phone]  Fax [phone]

## 2021-05-16 ENCOUNTER — Encounter (INDEPENDENT_AMBULATORY_CARE_PROVIDER_SITE_OTHER): Payer: Self-pay | Admitting: Vascular Surgery

## 2021-05-16 NOTE — Progress Notes (Signed)
MRN : [id number]  Jonathan Fields is a 65 y.o. (01-Dec-1956) male who presents with chief complaint of  Chief Complaint  Patient presents with   Follow-up    Pt conv. Le ven reflux   .  History of Present Illness:  The patient returns for followup evaluation 3 months after the initial visit. The patient continues to have pain in the lower extremities with dependency. The pain is lessened with elevation. Graduated compression stockings, Class I (20-30 mmHg), have been worn but the stockings do not eliminate the leg pain. Over-the-counter analgesics do not improve the symptoms. The degree of discomfort continues to interfere with daily activities. The patient notes the pain in the legs is causing problems with daily exercise, at the workplace and even with household activities and maintenance such as standing in the kitchen preparing meals and doing dishes.   Venous ultrasound shows normal deep venous system, no evidence of acute or chronic DVT.  Superficial reflux is not present.   Current Meds  Medication Sig   atorvastatin (LIPITOR) 40 MG tablet Take 1 tablet (40 mg total) by mouth daily.   empagliflozin (JARDIANCE) 25 MG TABS tablet Take 1 tablet (25 mg total) by mouth daily before breakfast.   erythromycin ophthalmic ointment Place along incisions four times daily   fluticasone (FLONASE) 50 MCG/ACT nasal spray instill 1 to 2 sprays into each nostril once daily   lisinopril (ZESTRIL) 40 MG tablet TAKE 1 TABLET BY MOUTH DAILY   mirabegron ER (MYRBETRIQ) 50 MG TB24 tablet Take 1 tablet (50 mg total) by mouth daily.   NEEDLE, DISP, 21 G (BD SAFETYGLIDE SHIELDED NEEDLE) 21G X 1-1/2" MISC Use 21guage needle to inject Testosterone Cypionate 56ml.   omeprazole (PRILOSEC) 20 MG capsule Take 20 mg by mouth daily.    SYRINGE-NEEDLE, DISP, 3 ML 18G X 1-1/2" 3 ML MISC Draw up 41ml Testosterone Cypionate with 18 gauge needle.   testosterone cypionate (DEPOTESTOSTERONE CYPIONATE) 200 MG/ML injection  INJECT 0.5 ML IN THE MUSCLE EVERY WEEK    Past Medical History:  Diagnosis Date   BPH (benign prostatic hyperplasia)    Bronchitis    Complication of anesthesia    hard to wake up   DM (diabetes mellitus), type 2 ([place])    DVT (deep venous thrombosis) ([place])    post op   ED (erectile dysfunction)    Erythrocytosis 07/04/2020   GERD (gastroesophageal reflux disease)    Hyperlipemia    Hypertension    Hypogonadism in male    Nocturia    Right ankle instability    Sleep apnea    TB lung, latent    Urinary frequency     Past Surgical History:  Procedure Laterality Date   ANKLE SURGERY Left    torn ligament   CARPAL TUNNEL RELEASE Right 08/2019   COLONOSCOPY  2009   KNEE ARTHROSCOPY Left    right ankle ruptured tendon surgery  2008   TRANSURETHRAL RESECTION OF PROSTATE N/A 02/19/2018   Procedure: TRANSURETHRAL RESECTION OF THE PROSTATE (TURP);  Surgeon: [name], MD;  Location: [facility] ORS;  Service: Urology;  Laterality: N/A;    Social History Social History   Tobacco Use   Smoking status: Never   Smokeless tobacco: Current    Types: Snuff   Tobacco comments:    tobaco pouches that produce juice  Vaping Use   Vaping Use: Never used  Substance Use Topics   Alcohol use: Yes    Comment: socially, nothing  every week   Drug use: No    Family History Family History  Problem Relation Age of Onset   Heart disease Father    Hypertension Father    Prostate cancer Father    Heart disease Mother    Breast cancer Mother    Colon cancer Neg Hx     Allergies  Allergen Reactions   Penicillins Hives    Has patient had a PCN reaction causing immediate rash, facial/tongue/throat swelling, SOB or lightheadedness with hypotension: yes Has patient had a PCN reaction causing severe rash involving mucus membranes or skin necrosis: no Has patient had a PCN reaction that required hospitalization: no Has patient had a PCN reaction occurring within the last 10 years: no If  all of the above answers are "NO", then may proceed with Cephalosporin use.      REVIEW OF SYSTEMS (Negative unless checked)  Constitutional: [] Weight loss  [] Fever  [] Chills Cardiac: [] Chest pain   [] Chest pressure   [] Palpitations   [] Shortness of breath when laying flat   [] Shortness of breath with exertion. Vascular:  [] Pain in legs with walking   [x] Pain in legs at rest  [] History of DVT   [] Phlebitis   [x] Swelling in legs   [x] Varicose veins   [] Non-healing ulcers Pulmonary:   [] Uses home oxygen   [] Productive cough   [] Hemoptysis   [] Wheeze  [] COPD   [] Asthma Neurologic:  [] Dizziness   [] Seizures   [] History of stroke   [] History of TIA  [] Aphasia   [] Vissual changes   [] Weakness or numbness in arm   [] Weakness or numbness in leg Musculoskeletal:   [] Joint swelling   [] Joint pain   [] Low back pain Hematologic:  [] Easy bruising  [] Easy bleeding   [] Hypercoagulable state   [] Anemic Gastrointestinal:  [] Diarrhea   [] Vomiting  [x] Gastroesophageal reflux/heartburn   [] Difficulty swallowing. Genitourinary:  [] Chronic kidney disease   [] Difficult urination  [] Frequent urination   [] Blood in urine Skin:  [] Rashes   [] Ulcers  Psychological:  [] History of anxiety   []  History of major depression.  Physical Examination  Vitals:   05/13/21 1548  BP: (!) 155/103  Pulse: 86  Weight: 220 lb (99.8 kg)  Height: 6\' 2"  (1.88 m)   Body mass index is 28.25 kg/m. Gen: WD/WN, NAD Head: [place]/AT, No temporalis wasting.  Ear/Nose/Throat: Hearing grossly intact, nares w/o erythema or drainage Eyes: PER, EOMI, sclera nonicteric.  Neck: Supple, no large masses.   Pulmonary:  Good air movement, no audible wheezing bilaterally, no use of accessory muscles.  Cardiac: RRR, no JVD Vascular:  Large varicosities present extensively greater than 10 mm left.  Mild venous stasis changes to the legs bilaterally.  2+ soft pitting edema  Vessel Right Left  Radial Palpable Palpable  Gastrointestinal:  Non-distended. No guarding/no peritoneal signs.  Musculoskeletal: M/S 5/5 throughout.  No deformity or atrophy.  Neurologic: CN 2-12 intact. Symmetrical.  Speech is fluent. Motor exam as listed above. Psychiatric: Judgment intact, Mood & affect appropriate for pt's clinical situation. Dermatologic: No rashes or ulcers noted.  No changes consistent with cellulitis.   CBC Lab Results  Component Value Date   WBC 7.2 03/29/2021   HGB 16.6 03/29/2021   HCT 52.1 (H) 03/29/2021   MCV 77.4 (L) 03/29/2021   PLT 313 03/29/2021    BMET    Component Value Date/Time   NA 139 04/01/2021 1636   K 4.7 04/01/2021 1636   CL 103 04/01/2021 1636   CO2 18 (L) 04/01/2021 1636  GLUCOSE 157 (H) 04/01/2021 1636   GLUCOSE 429 (H) 03/28/2020 1050   BUN 23 04/01/2021 1636   BUN 25 02/09/2019 0000   CREATININE 1.13 04/01/2021 1636   CALCIUM 9.3 04/01/2021 1636   GFRNONAA 76 09/28/2020 0942   GFRAA 88 09/28/2020 0942   CrCl cannot be calculated (Patient's most recent lab result is older than the maximum 21 days allowed.).  COAG No results found for: INR, PROTIME  Radiology VAS [place] LOWER EXTREMITY VENOUS REFLUX  Result Date: 05/13/2021  Lower Venous Reflux Study Patient Name:  Jonathan Fields  Date of Exam:   05/13/2021 Medical Rec #: [id number]         Accession #:    [id number] Date of Birth: Nov 30, 1956        Patient Gender: M Patient Age:   64Y Exam Location:  [facility] Procedure:      VAS [place] LOWER EXTREMITY VENOUS REFLUX Referring Phys: [id number] Dolores Lory Pioneer --------------------------------------------------------------------------------  Indications: Varicosities, and multiple interventions in the past. No clear history.  Performing Technologist: Concha Norway RVT  Examination Guidelines: A complete evaluation includes B-mode imaging, spectral Doppler, color Doppler, and power Doppler as needed of all accessible portions of each vessel. Bilateral testing is considered an integral  part of a complete examination. Limited examinations for reoccurring indications may be performed as noted. The reflux portion of the exam is performed with the patient in reverse Trendelenburg. Significant venous reflux is defined as >500 ms in the superficial venous system, and >1 second in the deep venous system.  Venous Reflux Times +-------------+---------+------+-----------+------------+--------+ RIGHT        Reflux NoRefluxReflux TimeDiameter cmsComments                        Yes                                  +-------------+---------+------+-----------+------------+--------+ FV mid                 yes   >1 second                      +-------------+---------+------+-----------+------------+--------+ Popliteal              yes   >1 second                      +-------------+---------+------+-----------+------------+--------+ GSV at SFJ   no                            .41              +-------------+---------+------+-----------+------------+--------+ GSV mid thigh                              .33              +-------------+---------+------+-----------+------------+--------+  +---------+---------+------+-----------+------------+--------+ LEFT     Reflux NoRefluxReflux TimeDiameter cmsComments                    Yes                                  +---------+---------+------+-----------+------------+--------+ CFV  yes   >1 second                      +---------+---------+------+-----------+------------+--------+ FV mid             yes   >1 second                      +---------+---------+------+-----------+------------+--------+ Popliteal          yes   >1 second                      +---------+---------+------+-----------+------------+--------+   Summary: Right: - No evidence of deep vein thrombosis seen in the right lower extremity, from the common femoral through the popliteal veins. - No evidence of superficial  venous thrombosis in the right lower extremity. - Venous reflux is noted in the right femoral vein. - Venous reflux is noted in the right popliteal vein. - No clear visualization of GSV. Question closed with previous intervention  Left: - No evidence of deep vein thrombosis seen in the left lower extremity, from the common femoral through the popliteal veins. - No evidence of superficial venous thrombosis in the left lower extremity. - Venous reflux is noted in the left common femoral vein. - Venous reflux is noted in the left femoral vein. - Venous reflux is noted in the left popliteal vein. - GSV appears closed just distal to SFJ.  *See table(s) above for measurements and observations. Electronically signed by Hortencia Pilar MD on 05/13/2021 at 4:32:35 PM.    Final       Assessment/Plan 1. Varicose veins of both lower extremities with inflammation Recommend:  The patient has had successful ablation of the previously incompetent saphenous venous system but still has persistent symptoms of pain and swelling that are having a negative impact on daily life and daily activities.  Patient should undergo injection sclerotherapy to treat the residual varicosities.  The risks, benefits and alternative therapies were reviewed in detail with the patient.  All questions were answered.  The patient agrees to proceed with sclerotherapy at their convenience.  The patient will continue wearing the graduated compression stockings and using the over-the-counter pain medications to treat her symptoms.       2. Chronic venous insufficiency Recommend:  The patient has had successful ablation of the previously incompetent saphenous venous system but still has persistent symptoms of pain and swelling that are having a negative impact on daily life and daily activities.  Patient should undergo injection sclerotherapy to treat the residual varicosities.  The risks, benefits and alternative therapies were reviewed  in detail with the patient.  All questions were answered.  The patient agrees to proceed with sclerotherapy at their convenience.  The patient will continue wearing the graduated compression stockings and using the over-the-counter pain medications to treat her symptoms.       3. Hypertension associated with diabetes ([place]) Continue antihypertensive medications as already ordered, these medications have been reviewed and there are no changes at this time.   4. Hyperlipidemia associated with type 2 diabetes mellitus ([place]) Continue statin as ordered and reviewed, no changes at this time   5. Type 2 diabetes mellitus with other specified complication, without long-term current use of insulin (HCC) Continue hypoglycemic medications as already ordered, these medications have been reviewed and there are no changes at this time.  Hgb A1C to be monitored as already arranged by primary service     Hortencia Pilar,  MD  05/16/2021 1:04 PM

## 2021-05-23 DIAGNOSIS — M19012 Primary osteoarthritis, left shoulder: Secondary | ICD-10-CM | POA: Diagnosis not present

## 2021-05-26 DIAGNOSIS — J320 Chronic maxillary sinusitis: Secondary | ICD-10-CM | POA: Diagnosis not present

## 2021-05-26 DIAGNOSIS — R059 Cough, unspecified: Secondary | ICD-10-CM | POA: Diagnosis not present

## 2021-05-26 DIAGNOSIS — Z20822 Contact with and (suspected) exposure to covid-19: Secondary | ICD-10-CM | POA: Diagnosis not present

## 2021-05-29 ENCOUNTER — Other Ambulatory Visit: Payer: Self-pay | Admitting: Family Medicine

## 2021-06-11 ENCOUNTER — Ambulatory Visit: Payer: BC Managed Care – PPO | Admitting: Podiatry

## 2021-06-11 ENCOUNTER — Ambulatory Visit (INDEPENDENT_AMBULATORY_CARE_PROVIDER_SITE_OTHER): Payer: BC Managed Care – PPO

## 2021-06-11 ENCOUNTER — Other Ambulatory Visit: Payer: Self-pay

## 2021-06-11 DIAGNOSIS — M19071 Primary osteoarthritis, right ankle and foot: Secondary | ICD-10-CM | POA: Diagnosis not present

## 2021-06-11 DIAGNOSIS — M25371 Other instability, right ankle: Secondary | ICD-10-CM

## 2021-06-11 DIAGNOSIS — Q6671 Congenital pes cavus, right foot: Secondary | ICD-10-CM | POA: Diagnosis not present

## 2021-06-11 DIAGNOSIS — B070 Plantar wart: Secondary | ICD-10-CM

## 2021-06-11 DIAGNOSIS — S9031XA Contusion of right foot, initial encounter: Secondary | ICD-10-CM

## 2021-06-11 NOTE — Progress Notes (Signed)
HPI: 65 y.o. male presenting today for follow-up evaluation of a symptomatic callus to the plantar aspect of the left fifth MTPJ.  Patient states he is doing much better.  He has been applying salicylic acid as instructed with significant improvement.  He presents for further treatment and evaluation  Patient also has a new complaint today associated to a history of prior injury and surgery to the right foot and ankle.  Patient describes the injury as tendon ruptures where surgery was performed to correct for tendon tears.  This was several years ago and performed by a Psychologist, sport and exercise at [place].  Ever since the surgery the patient states that he has chronic ankle instability and rolls his ankles constantly.  He currently does not do anything for treatment other than wears hightop shoes to try and prevent the ankle instability.  He presents for further treatment and evaluation  Past Medical History:  Diagnosis Date   BPH (benign prostatic hyperplasia)    Bronchitis    Complication of anesthesia    hard to wake up   DM (diabetes mellitus), type 2 ([place])    DVT (deep venous thrombosis) ([place])    post op   ED (erectile dysfunction)    Erythrocytosis 07/04/2020   GERD (gastroesophageal reflux disease)    Hyperlipemia    Hypertension    Hypogonadism in male    Nocturia    Right ankle instability    Sleep apnea    TB lung, latent    Urinary frequency      Physical Exam: General: The patient is alert and oriented x3 in no acute distress.  Dermatology: Skin is warm, dry and supple bilateral lower extremities. Negative for open lesions or macerations.  Hyperkeratotic skin lesion noted to the plantar aspect of the fifth MTPJ left foot  Vascular: Palpable pedal pulses bilaterally. No edema or erythema noted. Capillary refill within normal limits.  Neurological: Epicritic and protective threshold grossly intact bilaterally.   Musculoskeletal Exam: Range of motion within normal limits to  all pedal and ankle joints bilateral. Muscle strength 5/5 in all groups bilateral.  Cavus foot deformity noted with calcaneal varus upon weightbearing and with physical exam.  Lateral column loading also noted.  This is likely contributing to the ankle instability.  Anterior drawer sign noted with chronic ankle instability  Radiographic Exam:  Degenerative changes noted to the tibiotalar joint with slight talar tilt and increased space to the lateral gutter of the tibiotalar joint.  There are two orthopedic screws noted to the posterior calcaneal tubercle.  Screws appear stable and intact.  They do not protrude into the subtalar joint.  There does appear to be some varus deformity of the calcaneus.  There is also an orthopedic plate with 5 respective screws along the proximal portion of the first metatarsal.  This does not cross the TMT.  It is completely localized within the first metatarsal.  It appears stable and intact.  No fractures identified.  Joint spaces mostly preserved except for the degenerative changes noted  Assessment: 1.  Benign skin lesion subfifth MTPJ left foot; improved 2.  DJD right ankle 3.  Cavus foot deformity right 4.  Chronic ankle instability right   Plan of Care:  1. Patient evaluated. X-Rays reviewed.  2.  Continue salicylic acid and light dressing as needed for the callus lesion noted to the subphase MTPJ left foot 3.  Today we discussed the retained hardware and the deformity and instability of the right foot and  ankle.  I explained that unfortunately with surgery the foot does appear to be in more varus position which is contributing to his ankle instability and recurrent ankle sprains.  I do believe that ankle stabilization surgery may improve the patient's symptoms, although it will not correct for the varus deformity of the calcaneus. 4.  The patient seems very excited to pursue surgery.  He would like to go home and discuss this with his wife. 5.  Return to clinic  as needed, possibly with his wife to discuss surgery and possible surgical consultation      Edrick Kins, DPM [facility]  Dr. Edrick Kins, DPM    [address] [place],  [address]                Office [phone]  Fax [phone]

## 2021-06-16 ENCOUNTER — Other Ambulatory Visit: Payer: Self-pay | Admitting: Family Medicine

## 2021-06-16 NOTE — Telephone Encounter (Signed)
discontinued on 04/01/2021 by [place], [place] "error"

## 2021-06-16 NOTE — Telephone Encounter (Signed)
Requested Prescriptions  Pending Prescriptions Disp Refills  . metFORMIN (GLUCOPHAGE-XR) 500 MG 24 hr tablet [Pharmacy Med Name: METFORMIN ER 500MG 24HR TABS] 360 tablet     Sig: TAKE 4 TABLETS BY MOUTH EVERY MORNING     Endocrinology:  Diabetes - Biguanides Passed - 06/16/2021  7:17 AM      Passed - Cr in normal range and within 360 days    Creatinine, Ser  Date Value Ref Range Status  04/01/2021 1.13 0.76 - 1.27 mg/dL Final         Passed - HBA1C is between 0 and 7.9 and within 180 days    Hemoglobin A1C  Date Value Ref Range Status  11/26/2018 7.0  Final   Hgb A1c MFr Bld  Date Value Ref Range Status  04/01/2021 7.8 (H) 4.8 - 5.6 % Final    Comment:             Prediabetes: 5.7 - 6.4          Diabetes: >6.4          Glycemic control for adults with diabetes: <7.0    A1c  Date Value Ref Range Status  02/09/2019 7.3  Final         Passed - eGFR in normal range and within 360 days    GFR calc Af Amer  Date Value Ref Range Status  09/28/2020 88 >59 mL/min/1.73 Final    Comment:    **In accordance with recommendations from the NKF-ASN Task force,**   [facility] is in the process of updating its eGFR calculation to the   2021 CKD-EPI creatinine equation that estimates kidney function   without a race variable.    GFR calc non Af Amer  Date Value Ref Range Status  09/28/2020 76 >59 mL/min/1.73 Final   eGFR  Date Value Ref Range Status  04/01/2021 72 >59 mL/min/1.73 Final         Passed - Valid encounter within last 6 months    Recent Outpatient Visits          2 months ago Type 2 diabetes mellitus with other specified complication, without long-term current use of insulin [facility])   [facility] [place], Dionne Bucy, MD   5 months ago Rotator cuff arthropathy of left shoulder   [facility], Dionne Bucy, MD   8 months ago Hypertension associated with diabetes [facility])   [facility], Dionne Bucy, MD   11 months  ago Type 2 diabetes mellitus with other specified complication, without long-term current use of insulin [facility])   [facility] [place], [place], PA-C   1 year ago Type 2 diabetes mellitus with other specified complication, without long-term current use of insulin [facility])   [place], Wendee Beavers, [place]      Future Appointments            In 4 months [name], Dionne Bucy, MD [facility]   In 7 months Stoioff, Ronda Fairly, MD [place]           . lisinopril (ZESTRIL) 40 MG tablet [Pharmacy Med Name: LISINOPRIL 40MG TABLETS] 90 tablet 0    Sig: TAKE 1 TABLET BY MOUTH DAILY     Cardiovascular:  ACE Inhibitors Failed - 06/16/2021  7:17 AM      Failed - Last BP in normal range    BP Readings from Last 1 Encounters:  05/13/21 (!) 155/103  Passed - Cr in normal range and within 180 days    Creatinine, Ser  Date Value Ref Range Status  04/01/2021 1.13 0.76 - 1.27 mg/dL Final         Passed - K in normal range and within 180 days    Potassium  Date Value Ref Range Status  04/01/2021 4.7 3.5 - 5.2 mmol/L Final         Passed - Patient is not pregnant      Passed - Valid encounter within last 6 months    Recent Outpatient Visits          2 months ago Type 2 diabetes mellitus with other specified complication, without long-term current use of insulin ([place])   [facility] [place], Dionne Bucy, MD   5 months ago Rotator cuff arthropathy of left shoulder   [facility] [place], Dionne Bucy, MD   8 months ago Hypertension associated with diabetes [facility])   [facility], Dionne Bucy, MD   11 months ago Type 2 diabetes mellitus with other specified complication, without long-term current use of insulin [facility])   [facility] [place], [place], [place]   1 year ago Type 2 diabetes mellitus with other specified complication, without long-term current use  of insulin [facility])   [place], Wendee Beavers, [place]      Future Appointments            In 4 months [name], Dionne Bucy, MD [facility], [place]   In 7 months [place], Ronda Fairly, MD [place]

## 2021-06-18 ENCOUNTER — Other Ambulatory Visit: Payer: Self-pay | Admitting: *Deleted

## 2021-06-18 DIAGNOSIS — E291 Testicular hypofunction: Secondary | ICD-10-CM

## 2021-06-25 ENCOUNTER — Ambulatory Visit: Payer: BC Managed Care – PPO | Admitting: Podiatry

## 2021-06-25 ENCOUNTER — Other Ambulatory Visit: Payer: Self-pay

## 2021-06-25 DIAGNOSIS — M25371 Other instability, right ankle: Secondary | ICD-10-CM

## 2021-06-25 NOTE — Progress Notes (Signed)
HPI: 65 y.o. male presenting today for follow-up evaluation of a symptomatic callus to the plantar aspect of the left fifth MTPJ as well as to discuss possible surgery that was mentioned last visit to help stabilize his right ankle joint.  Patient states that the callus to the plantar aspect of the left foot is significantly improved.  Patient states that he has given significant thought to the surgery and would like to proceed.  He is very hopeful that this will help stabilize his ankle so he is not suffering from recurrent ankle sprains almost on a daily basis.  He presents for further treatment and evaluation  Past Medical History:  Diagnosis Date   BPH (benign prostatic hyperplasia)    Bronchitis    Complication of anesthesia    hard to wake up   DM (diabetes mellitus), type 2 ([place])    DVT (deep venous thrombosis) ([place])    post op   ED (erectile dysfunction)    Erythrocytosis 07/04/2020   GERD (gastroesophageal reflux disease)    Hyperlipemia    Hypertension    Hypogonadism in male    Nocturia    Right ankle instability    Sleep apnea    TB lung, latent    Urinary frequency      Physical Exam: General: The patient is alert and oriented x3 in no acute distress.  Dermatology: Skin is warm, dry and supple bilateral lower extremities. Negative for open lesions or macerations.  Hyperkeratotic skin lesion noted to the plantar aspect of the fifth MTPJ left foot  Vascular: Palpable pedal pulses bilaterally. No edema or erythema noted. Capillary refill within normal limits.  Neurological: Epicritic and protective threshold grossly intact bilaterally.   Musculoskeletal Exam: Range of motion within normal limits to all pedal and ankle joints bilateral. Muscle strength 5/5 in all groups bilateral.  Cavus foot deformity noted with calcaneal varus upon weightbearing and with physical exam.  Lateral column loading also noted.  This is likely contributing to the ankle instability.  Anterior  drawer sign noted with chronic ankle instability  Radiographic Exam taken last visit:  Degenerative changes noted to the tibiotalar joint with slight talar tilt and increased space to the lateral gutter of the tibiotalar joint.  There are two orthopedic screws noted to the posterior calcaneal tubercle.  Screws appear stable and intact.  They do not protrude into the subtalar joint.  There does appear to be some varus deformity of the calcaneus.  There is also an orthopedic plate with 5 respective screws along the proximal portion of the first metatarsal.  This does not cross the TMT.  It is completely localized within the first metatarsal.  It appears stable and intact.  No fractures identified.  Joint spaces mostly preserved except for the degenerative changes noted  Assessment: 1.  Benign skin lesion subfifth MTPJ left foot; improved 2.  DJD right ankle 3.  Cavus foot deformity right 4.  Chronic ankle instability right with recurrent ankle sprains   Plan of Care:  1. Patient evaluated.  The patient states that he has had chronic ankle instability for several years and it is affecting his daily activities.  He would love nothing more than be able to jog without constantly rolling his ankle.  This all began postoperatively after he had reconstructive surgery of the foot.  Although a lateral ankle stabilization would not correct for his mild cavus foot deformity, I do believe that stabilizing the lateral aspect of the ankle will help  somewhat with the stability and reduce the amount of ankle sprains. 2. Today we discussed the conservative versus surgical management of the presenting pathology. The patient opts for surgical management. All possible complications and details of the procedure were explained. All patient questions were answered. No guarantees were expressed or implied. 3. Authorization for surgery was initiated today. Surgery will consist of lateral ankle stabilization right 4.  MRI prior  to surgery was considered however decision was made not to have an MRI performed due to the hardware in the foot which will likely cause refraction throughout the foot and reduced visualization.  MRI likely would not change the surgical plan of care for the patient.  Also, clinically, the patient does suffer from chronic ankle instability.  5.  Return to clinic 1 week postop       Jonathan Fields, DPM [facility]  Dr. Edrick Fields, DPM    [address], [place] [address]                Office [phone]  Fax [phone]

## 2021-07-09 ENCOUNTER — Other Ambulatory Visit: Payer: Self-pay | Admitting: Urology

## 2021-07-26 ENCOUNTER — Inpatient Hospital Stay: Payer: BC Managed Care – PPO | Attending: Oncology

## 2021-07-26 ENCOUNTER — Inpatient Hospital Stay: Payer: BC Managed Care – PPO

## 2021-07-26 ENCOUNTER — Inpatient Hospital Stay: Payer: BC Managed Care – PPO | Admitting: Nurse Practitioner

## 2021-07-26 ENCOUNTER — Other Ambulatory Visit: Payer: Self-pay

## 2021-07-26 ENCOUNTER — Encounter: Payer: Self-pay | Admitting: Nurse Practitioner

## 2021-07-26 VITALS — BP 135/83 | HR 84 | Temp 99.0°F | Resp 18 | Wt 224.0 lb

## 2021-07-26 DIAGNOSIS — Z79899 Other long term (current) drug therapy: Secondary | ICD-10-CM | POA: Insufficient documentation

## 2021-07-26 DIAGNOSIS — E119 Type 2 diabetes mellitus without complications: Secondary | ICD-10-CM | POA: Insufficient documentation

## 2021-07-26 DIAGNOSIS — I10 Essential (primary) hypertension: Secondary | ICD-10-CM | POA: Diagnosis not present

## 2021-07-26 DIAGNOSIS — Z79890 Hormone replacement therapy (postmenopausal): Secondary | ICD-10-CM | POA: Diagnosis not present

## 2021-07-26 DIAGNOSIS — D500 Iron deficiency anemia secondary to blood loss (chronic): Secondary | ICD-10-CM | POA: Diagnosis not present

## 2021-07-26 DIAGNOSIS — Z86718 Personal history of other venous thrombosis and embolism: Secondary | ICD-10-CM | POA: Insufficient documentation

## 2021-07-26 DIAGNOSIS — Z9079 Acquired absence of other genital organ(s): Secondary | ICD-10-CM | POA: Diagnosis not present

## 2021-07-26 DIAGNOSIS — E785 Hyperlipidemia, unspecified: Secondary | ICD-10-CM | POA: Diagnosis not present

## 2021-07-26 DIAGNOSIS — D751 Secondary polycythemia: Secondary | ICD-10-CM

## 2021-07-26 DIAGNOSIS — K219 Gastro-esophageal reflux disease without esophagitis: Secondary | ICD-10-CM | POA: Insufficient documentation

## 2021-07-26 DIAGNOSIS — N400 Enlarged prostate without lower urinary tract symptoms: Secondary | ICD-10-CM | POA: Diagnosis not present

## 2021-07-26 DIAGNOSIS — G4730 Sleep apnea, unspecified: Secondary | ICD-10-CM | POA: Diagnosis not present

## 2021-07-26 LAB — CBC WITH DIFFERENTIAL/PLATELET
Abs Immature Granulocytes: 0.05 10*3/uL (ref 0.00–0.07)
Basophils Absolute: 0 10*3/uL (ref 0.0–0.1)
Basophils Relative: 1 %
Eosinophils Absolute: 0.1 10*3/uL (ref 0.0–0.5)
Eosinophils Relative: 2 %
HCT: 50.6 % (ref 39.0–52.0)
Hemoglobin: 16.6 g/dL (ref 13.0–17.0)
Immature Granulocytes: 1 %
Lymphocytes Relative: 23 %
Lymphs Abs: 1.4 10*3/uL (ref 0.7–4.0)
MCH: 26.6 pg (ref 26.0–34.0)
MCHC: 32.8 g/dL (ref 30.0–36.0)
MCV: 81.1 fL (ref 80.0–100.0)
Monocytes Absolute: 0.7 10*3/uL (ref 0.1–1.0)
Monocytes Relative: 11 %
Neutro Abs: 3.7 10*3/uL (ref 1.7–7.7)
Neutrophils Relative %: 62 %
Platelets: 211 10*3/uL (ref 150–400)
RBC: 6.24 MIL/uL — ABNORMAL HIGH (ref 4.22–5.81)
RDW: 17.7 % — ABNORMAL HIGH (ref 11.5–15.5)
WBC: 5.8 10*3/uL (ref 4.0–10.5)
nRBC: 0 % (ref 0.0–0.2)

## 2021-07-26 NOTE — Progress Notes (Signed)
Hematology/Oncology Progress Note Jonathan Fields [phone] [phone]   Patient Care Team: Virginia Crews, MD as PCP - General (Family Medicine)  REFERRING PROVIDER: Virginia Crews, MD   CHIEF COMPLAINTS/REASON FOR VISIT:  follo wup  of polycytosis/erythrocytosis  HISTORY OF PRESENTING ILLNESS:  Jonathan Fields is a 65 y.o. male who was seen in consultation at the request of Jonathan Fields, Jonathan Bucy, MD for evaluation of polycytosis/erythrocytosis Reviewed patient's recent lab work 03/14/2020, hematocrit 55, 09/13/2019 hematocrit 51.8, 07/22/2019, hematocrit 49.1.  Hemoglobin 17.2.   Patient is on testosterone replacement.  Currently Dr. Bernardo Heater hold off treatments due to erythrocytosis. Patient was is Dr. Dennis Bast to hematology oncology for evaluation and management. Associated signs or symptoms: Patient mention about 15 pounds of unintentional weight loss recently during the past few weeks.  His appetite is unchanged.  He feels that he has eaten similar amount of food recently.  Denies any increase of activity level.  Some sweating for the past 2 days.  Context:  Smoking history: Denies. Testosterone supplements: History of blood clots: Previously he has an episode of provoked thrombosis after surgery. Daytime somnolence: Denies Family history of polycythemia: Denies  INTERVAL HISTORY [name] is a 65 y.o. 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  He continues to feel very well.  Continues testosterone without dose changes recently.  Has tolerated phlebotomy well  Review of Systems  Constitutional:  Negative for appetite change, chills, fatigue, fever and unexpected weight change.  HENT:   Negative for hearing loss and voice change.   Eyes:  Negative for eye problems and icterus.  Respiratory:   Negative for chest tightness, cough and shortness of breath.   Cardiovascular:  Negative for chest pain and leg swelling.  Gastrointestinal:  Negative for abdominal distention and abdominal pain.  Endocrine: Negative for hot flashes.  Genitourinary:  Negative for difficulty urinating, dysuria and frequency.   Musculoskeletal:  Negative for arthralgias.  Skin:  Negative for itching and rash.  Neurological:  Negative for light-headedness and numbness.  Hematological:  Negative for adenopathy. Does not bruise/bleed easily.  Psychiatric/Behavioral:  Negative for confusion.    MEDICAL HISTORY:  Past Medical History:  Diagnosis Date   BPH (benign prostatic hyperplasia)    Bronchitis    Complication of anesthesia    hard to wake up   DM (diabetes mellitus), type 2 (Jonathan Fields)    DVT (deep venous thrombosis) ([place])    post op   ED (erectile dysfunction)    Erythrocytosis 07/04/2020   GERD (gastroesophageal reflux disease)    Hyperlipemia    Hypertension    Hypogonadism in male    Nocturia    Right ankle instability    Sleep apnea    TB lung, latent    Urinary frequency     SURGICAL HISTORY: Past Surgical History:  Procedure Laterality Date   ANKLE SURGERY Left    torn ligament   CARPAL TUNNEL RELEASE Right 08/2019   COLONOSCOPY  2009   KNEE ARTHROSCOPY Left    right ankle ruptured tendon surgery  2008   TRANSURETHRAL RESECTION OF PROSTATE N/A 02/19/2018   Procedure: TRANSURETHRAL RESECTION OF THE PROSTATE (TURP);  Surgeon: [name], MD;  Location: [facility] ORS;  Service: Urology;  Laterality: N/A;    SOCIAL HISTORY: Social History   Socioeconomic History   Marital status: Married    Spouse name:  Not on file   Number of children: 0   Years of education: Not on file   Highest education level: Not on file  Occupational History   Not on file  Tobacco Use   Smoking status: Never   Smokeless tobacco: Current    Types: Snuff   Tobacco comments:    tobaco pouches that  produce juice  Vaping Use   Vaping Use: Never used  Substance and Sexual Activity   Alcohol use: Yes    Comment: socially, nothing every week   Drug use: No   Sexual activity: Yes    Partners: Female    Birth control/protection: None  Other Topics Concern   Not on file  Social History Narrative   Not on file   Social Determinants of Health   Financial Resource Strain: Not on file  Food Insecurity: Not on file  Transportation Needs: Not on file  Physical Activity: Not on file  Stress: Not on file  Social Connections: Not on file  Intimate Partner Violence: Not on file    FAMILY HISTORY: Family History  Problem Relation Age of Onset   Heart disease Father    Hypertension Father    Prostate cancer Father    Heart disease Mother    Breast cancer Mother    Colon cancer Neg Hx     ALLERGIES:  is allergic to penicillins.  MEDICATIONS:  Current Outpatient Medications  Medication Sig Dispense Refill   atorvastatin (LIPITOR) 40 MG tablet Take 1 tablet (40 mg total) by mouth daily. 90 tablet 3   empagliflozin (JARDIANCE) 25 MG TABS tablet Take 1 tablet (25 mg total) by mouth daily before breakfast. 90 tablet 3   erythromycin ophthalmic ointment Place along incisions four times daily     fluticasone (FLONASE) 50 MCG/ACT nasal spray instill 1 to 2 sprays into each nostril once daily  0   lisinopril (ZESTRIL) 40 MG tablet TAKE 1 TABLET BY MOUTH DAILY 90 tablet 0   MYRBETRIQ 50 MG TB24 tablet TAKE 1 TABLET(50 MG) BY MOUTH DAILY 30 tablet 3   NEEDLE, DISP, 21 G (BD SAFETYGLIDE SHIELDED NEEDLE) 21G X 1-1/2" MISC Use 21guage needle to inject Testosterone Cypionate 101m. 25 each 0   omeprazole (PRILOSEC) 20 MG capsule Take 20 mg by mouth daily.      SYRINGE-NEEDLE, DISP, 3 ML 18G X 1-1/2" 3 ML MISC Draw up 161mTestosterone Cypionate with 18 gauge needle. 25 each 0   testosterone cypionate (DEPOTESTOSTERONE CYPIONATE) 200 MG/ML injection INJECT 0.5 ML IN THE MUSCLE EVERY WEEK 10 mL 0    No current facility-administered medications for this visit.    PHYSICAL EXAMINATION: ECOG PERFORMANCE STATUS: 0 - Asymptomatic Vitals:   07/26/21 1334  BP: 135/83  Pulse: 84  Resp: 18  Temp: 99 F (37.2 C)    Filed Weights   07/26/21 1334  Weight: 224 lb (101.6 kg)    Physical Exam Constitutional:      General: He is not in acute distress.    Appearance: He is well-developed. He is not ill-appearing.  HENT:     Mouth/Throat:     Pharynx: No oropharyngeal exudate.  Eyes:     General: No scleral icterus. Cardiovascular:     Rate and Rhythm: Normal rate and regular rhythm.     Heart sounds: Normal heart sounds.  Pulmonary:     Effort: Pulmonary effort is normal.     Breath sounds: Normal breath sounds. No wheezing.  Abdominal:  General: There is no distension.     Tenderness: There is no abdominal tenderness.  Musculoskeletal:        General: Normal range of motion.     Cervical back: Normal range of motion and neck supple.  Skin:    General: Skin is warm and dry.     Coloration: Skin is not pale.  Neurological:     Mental Status: He is alert and oriented to person, place, and time.  Psychiatric:        Behavior: Behavior normal.    RADIOGRAPHIC STUDIES: No results found.   LABORATORY DATA:   I have reviewed the data as listed Lab Results  Component Value Date   WBC 5.8 07/26/2021   HGB 16.6 07/26/2021   HCT 50.6 07/26/2021   MCV 81.1 07/26/2021   PLT 211 07/26/2021    Recent Labs    09/28/20 0942 04/01/21 1636  NA 137 139  K 4.1 4.7  CL 101 103  CO2 17* 18*  GLUCOSE 228* 157*  BUN 16 23  CREATININE 1.03 1.13  CALCIUM 9.0 9.3  GFRNONAA 76  --   GFRAA 88  --   PROT 6.4 6.5  ALBUMIN 4.3 4.4  AST 19 23  ALT 25 88*  ALKPHOS 80 74  BILITOT 1.0 0.5      Component Value Date/Time   IRON 105 03/29/2021 1339   TIBC 510 (H) 03/29/2021 1339   FERRITIN 18 (L) 03/29/2021 1339   IRONPCTSAT 21 03/29/2021 1339    ASSESSMENT & PLAN:    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  Hematocrit goal is less than 50.  Labs reviewed today and hematocrit is 50.6.  Proceed with phlebotomy today Microcytic anemia-iron panel showed iron deficiency with ferritin of 18.  Recommend against iron replacement at this time. Recommend he see GI.  Chronic testosterone replacement-follow-up with urology  RTC every 6 weeks for labs and phlebotomy if hematocrit >/= 50 RTC in 4 months for labs, Dr. Tasia Catchings, and possible phlebotomy  No orders of the defined types were placed in this encounter.   We spent sufficient time to discuss many aspect of care, questions were answered to patient's satisfaction. The patient knows to call the clinic with any problems questions or concerns.  Beckey Rutter, DNP, AGNP-C [place] [facility] [phone] (clinic) 07/26/2021  CC: Dr. Bernardo Heater

## 2021-07-26 NOTE — Progress Notes (Signed)
Patient here today for labs and follow up with possibly phlebotomy. Patient is feeling well today and offers no new complaints.

## 2021-08-06 ENCOUNTER — Other Ambulatory Visit: Payer: Self-pay | Admitting: Family Medicine

## 2021-08-06 ENCOUNTER — Encounter: Payer: Self-pay | Admitting: Podiatry

## 2021-08-06 ENCOUNTER — Other Ambulatory Visit: Payer: Self-pay

## 2021-08-06 ENCOUNTER — Ambulatory Visit: Payer: BC Managed Care – PPO | Admitting: Podiatry

## 2021-08-06 DIAGNOSIS — M19071 Primary osteoarthritis, right ankle and foot: Secondary | ICD-10-CM | POA: Diagnosis not present

## 2021-08-06 DIAGNOSIS — B070 Plantar wart: Secondary | ICD-10-CM | POA: Diagnosis not present

## 2021-08-06 DIAGNOSIS — M25371 Other instability, right ankle: Secondary | ICD-10-CM | POA: Diagnosis not present

## 2021-08-06 NOTE — Progress Notes (Signed)
HPI: 65 y.o. male presenting today for follow-up evaluation of a symptomatic callus to the plantar aspect of the left fifth MTPJ as well as to again discuss possible surgery that was mentioned last visit to help stabilize his right ankle joint.  Patient states that the callus to the plantar aspect of the left foot has returned.  It is symptomatic today.  Past Medical History:  Diagnosis Date   BPH (benign prostatic hyperplasia)    Bronchitis    Complication of anesthesia    hard to wake up   DM (diabetes mellitus), type 2 ([place])    DVT (deep venous thrombosis) ([place])    post op   ED (erectile dysfunction)    Erythrocytosis 07/04/2020   GERD (gastroesophageal reflux disease)    Hyperlipemia    Hypertension    Hypogonadism in male    Nocturia    Right ankle instability    Sleep apnea    TB lung, latent    Urinary frequency      Physical Exam: General: The patient is alert and oriented x3 in no acute distress.  Dermatology: Skin is warm, dry and supple bilateral lower extremities. Negative for open lesions or macerations.  Hyperkeratotic skin lesion noted to the plantar aspect of the fifth MTPJ left foot  Vascular: Palpable pedal pulses bilaterally. No edema or erythema noted. Capillary refill within normal limits.  Neurological: Epicritic and protective threshold grossly intact bilaterally.   Musculoskeletal Exam: Range of motion within normal limits to all pedal and ankle joints bilateral. Muscle strength 5/5 in all groups bilateral.  Cavus foot deformity noted with calcaneal varus upon weightbearing and with physical exam.  Lateral column loading also noted.  This is likely contributing to the ankle instability.  Anterior drawer sign noted with chronic ankle instability  Radiographic Exam taken last visit:  Degenerative changes noted to the tibiotalar joint with slight talar tilt and increased space to the lateral gutter of the tibiotalar joint.  There are two orthopedic screws  noted to the posterior calcaneal tubercle.  Screws appear stable and intact.  They do not protrude into the subtalar joint.  There does appear to be some varus deformity of the calcaneus.  There is also an orthopedic plate with 5 respective screws along the proximal portion of the first metatarsal.  This does not cross the TMT.  It is completely localized within the first metatarsal.  It appears stable and intact.  No fractures identified.  Joint spaces mostly preserved except for the degenerative changes noted  Assessment: 1.  Benign skin lesion subfifth MTPJ left foot; improved 2.  DJD right ankle 3.  Cavus foot deformity right 4.  Chronic ankle instability right with recurrent ankle sprains   Plan of Care:  1. Patient evaluated.   2.  Light debridement of the callus lesion was performed using a 312 scalpel without incident or bleeding 3.  Today again we discussed the surgery associated to the patient's right ankle.  All of his questions were answered today.  We reviewed the surgery in detail including postoperative recovery and risks and possible complications associated to the surgery.  The patient is again excited to proceed with surgery. 4.  Recommend follow-up with surgery coordinator to ensure the surgical date and time 5.  Return to clinic 1 week postop      Edrick Kins, DPM [facility]  Dr. Edrick Kins, DPM    [address]  [place], [place] [address]                Office [phone]  Fax [phone]

## 2021-08-07 NOTE — Telephone Encounter (Signed)
Requested Prescriptions  Pending Prescriptions Disp Refills  . lisinopril (ZESTRIL) 40 MG tablet [Pharmacy Med Name: LISINOPRIL '40MG'$  TABLETS] 90 tablet 0    Sig: TAKE 1 TABLET BY MOUTH DAILY     Cardiovascular:  ACE Inhibitors Passed - 08/06/2021  7:30 PM      Passed - Cr in normal range and within 180 days    Creatinine, Ser  Date Value Ref Range Status  04/01/2021 1.13 0.76 - 1.27 mg/dL Final         Passed - K in normal range and within 180 days    Potassium  Date Value Ref Range Status  04/01/2021 4.7 3.5 - 5.2 mmol/L Final         Passed - Patient is not pregnant      Passed - Last BP in normal range    BP Readings from Last 1 Encounters:  07/26/21 112/85         Passed - Valid encounter within last 6 months    Recent Outpatient Visits          4 months ago Type 2 diabetes mellitus with other specified complication, without long-term current use of insulin ([place])   [facility] [place], Dionne Bucy, MD   7 months ago Rotator cuff arthropathy of left shoulder   [facility] [place], Dionne Bucy, MD   10 months ago Hypertension associated with diabetes [facility])   [facility] [place], Dionne Bucy, MD   1 year ago Type 2 diabetes mellitus with other specified complication, without long-term current use of insulin [facility])   [place], [place], PA-C   1 year ago Type 2 diabetes mellitus with other specified complication, without long-term current use of insulin [facility])   [place], Wendee Beavers, [place]      Future Appointments            In 2 months [name], Dionne Bucy, MD [facility], [place]   In 6 months [place], Ronda Fairly, [facility]

## 2021-08-09 ENCOUNTER — Other Ambulatory Visit: Payer: BC Managed Care – PPO

## 2021-08-09 ENCOUNTER — Other Ambulatory Visit: Payer: Self-pay

## 2021-08-09 DIAGNOSIS — E291 Testicular hypofunction: Secondary | ICD-10-CM

## 2021-08-10 LAB — HEMATOCRIT: Hematocrit: 49.8 % (ref 37.5–51.0)

## 2021-08-10 LAB — TESTOSTERONE: Testosterone: 811 ng/dL (ref 264–916)

## 2021-08-12 ENCOUNTER — Encounter: Payer: Self-pay | Admitting: *Deleted

## 2021-08-13 ENCOUNTER — Telehealth: Payer: Self-pay | Admitting: Urology

## 2021-08-13 NOTE — Telephone Encounter (Signed)
DOS - 09/05/21  LATERAL ANKLE STABILIZATION RIGHT --- [id number]   BCBS EFFECTIVE DATE - 05/31/21   PLAN DEDUCTIBLE - $1,500.00 W/ $1,430.79 REMAINING  OUT OF POCKET - $4,500.00 W/ $3,907.40 REMAINING COINSURANCE -  20% COPAY - $0.00    NO PRIOR AUTH IS REQUIRED

## 2021-08-29 DIAGNOSIS — M19012 Primary osteoarthritis, left shoulder: Secondary | ICD-10-CM | POA: Diagnosis not present

## 2021-08-29 DIAGNOSIS — M19011 Primary osteoarthritis, right shoulder: Secondary | ICD-10-CM | POA: Diagnosis not present

## 2021-09-05 ENCOUNTER — Encounter: Payer: Self-pay | Admitting: Podiatry

## 2021-09-05 ENCOUNTER — Other Ambulatory Visit: Payer: Self-pay | Admitting: Podiatry

## 2021-09-05 DIAGNOSIS — M25571 Pain in right ankle and joints of right foot: Secondary | ICD-10-CM | POA: Diagnosis not present

## 2021-09-05 DIAGNOSIS — M25371 Other instability, right ankle: Secondary | ICD-10-CM | POA: Diagnosis not present

## 2021-09-05 MED ORDER — OXYCODONE-ACETAMINOPHEN 5-325 MG PO TABS: 1.0000 | ORAL_TABLET | ORAL | 0 refills | Status: DC | PRN

## 2021-09-05 NOTE — Progress Notes (Signed)
Prn postop

## 2021-09-06 ENCOUNTER — Inpatient Hospital Stay: Payer: BC Managed Care – PPO

## 2021-09-12 ENCOUNTER — Other Ambulatory Visit: Payer: Self-pay

## 2021-09-12 ENCOUNTER — Ambulatory Visit (INDEPENDENT_AMBULATORY_CARE_PROVIDER_SITE_OTHER): Payer: BC Managed Care – PPO | Admitting: Podiatry

## 2021-09-12 DIAGNOSIS — Z9889 Other specified postprocedural states: Secondary | ICD-10-CM

## 2021-09-12 DIAGNOSIS — M25371 Other instability, right ankle: Secondary | ICD-10-CM

## 2021-09-12 MED ORDER — DOXYCYCLINE HYCLATE 100 MG PO TABS: 100.0000 mg | ORAL_TABLET | Freq: Two times a day (BID) | ORAL | 0 refills | Status: AC

## 2021-09-12 MED ORDER — OXYCODONE-ACETAMINOPHEN 5-325 MG PO TABS: 1.0000 | ORAL_TABLET | ORAL | 0 refills | Status: DC | PRN

## 2021-09-13 ENCOUNTER — Encounter: Payer: BC Managed Care – PPO | Admitting: Podiatry

## 2021-09-17 NOTE — Progress Notes (Signed)
Subjective:  Patient ID: Jonathan Fields, male    DOB: 1955/12/21,  MRN: [id number]  Chief Complaint  Patient presents with   Routine Post Op    DOS 10.6.22    DOS: 09/05/2021 Procedure: Status post ankle stabilization procedure right  65 y.o. male returns for post-op check.  Patient states is doing well.  He has been doing well on pain medication.  He would like to get another refill on that.  There is some erythema around the incision site.  He has been ambulating more than he should be.  He has been putting some weight down.  He denies any other acute complaints  Review of Systems: Negative except as noted in the HPI. Denies N/V/F/Ch.  Past Medical History:  Diagnosis Date   BPH (benign prostatic hyperplasia)    Bronchitis    Complication of anesthesia    hard to wake up   DM (diabetes mellitus), type 2 ([place])    DVT (deep venous thrombosis) ([place])    post op   ED (erectile dysfunction)    Erythrocytosis 07/04/2020   GERD (gastroesophageal reflux disease)    Hyperlipemia    Hypertension    Hypogonadism in male    Nocturia    Right ankle instability    Sleep apnea    TB lung, latent    Urinary frequency     Current Outpatient Medications:    doxycycline (VIBRA-TABS) 100 MG tablet, Take 1 tablet (100 mg total) by mouth 2 (two) times daily for 10 days., Disp: 20 tablet, Rfl: 0   atorvastatin (LIPITOR) 40 MG tablet, Take 1 tablet (40 mg total) by mouth daily., Disp: 90 tablet, Rfl: 3   empagliflozin (JARDIANCE) 25 MG TABS tablet, Take 1 tablet (25 mg total) by mouth daily before breakfast., Disp: 90 tablet, Rfl: 3   erythromycin ophthalmic ointment, Place along incisions four times daily, Disp: , Rfl:    fluticasone (FLONASE) 50 MCG/ACT nasal spray, instill 1 to 2 sprays into each nostril once daily, Disp: , Rfl: 0   lisinopril (ZESTRIL) 40 MG tablet, TAKE 1 TABLET BY MOUTH DAILY, Disp: 90 tablet, Rfl: 0   MYRBETRIQ 50 MG TB24 tablet, TAKE 1 TABLET(50 MG) BY MOUTH DAILY,  Disp: 30 tablet, Rfl: 3   NEEDLE, DISP, 21 G (BD SAFETYGLIDE SHIELDED NEEDLE) 21G X 1-1/2" MISC, Use 21guage needle to inject Testosterone Cypionate 40ml., Disp: 25 each, Rfl: 0   omeprazole (PRILOSEC) 20 MG capsule, Take 20 mg by mouth daily. , Disp: , Rfl:    oxyCODONE-acetaminophen (PERCOCET) 5-325 MG tablet, Take 1 tablet by mouth every 4 (four) hours as needed for severe pain., Disp: 30 tablet, Rfl: 0   SYRINGE-NEEDLE, DISP, 3 ML 18G X 1-1/2" 3 ML MISC, Draw up 31ml Testosterone Cypionate with 18 gauge needle., Disp: 25 each, Rfl: 0   testosterone cypionate (DEPOTESTOSTERONE CYPIONATE) 200 MG/ML injection, INJECT 0.5 ML IN THE MUSCLE EVERY WEEK, Disp: 10 mL, Rfl: 0  Social History   Tobacco Use  Smoking Status Never  Smokeless Tobacco Current   Types: Snuff  Tobacco Comments   tobaco pouches that produce juice    Allergies  Allergen Reactions   Penicillins Hives    Has patient had a PCN reaction causing immediate rash, facial/tongue/throat swelling, SOB or lightheadedness with hypotension: yes Has patient had a PCN reaction causing severe rash involving mucus membranes or skin necrosis: no Has patient had a PCN reaction that required hospitalization: no Has patient had a PCN reaction occurring within  the last 10 years: no If all of the above answers are "NO", then may proceed with Cephalosporin use.    Objective:  There were no vitals filed for this visit. There is no height or weight on file to calculate BMI. Constitutional Well developed. Well nourished.  Vascular Foot warm and well perfused. Capillary refill normal to all digits.   Neurologic Normal speech. Oriented to person, place, and time. Epicritic sensation to light touch grossly present bilaterally.  Dermatologic Skin healing well without signs of infection. Skin edges well coapted wNoneithout signs of infection.  Mild erythema around the incision site  Orthopedic: Tenderness to palpation noted about the surgical  site.   Radiographs: None Assessment:   1. Ankle joint instability, right   2. Status post foot surgery    Plan:  Patient was evaluated and treated and all questions answered.  S/p foot surgery right -Progressing as expected post-operatively. -XR: None -WB Status: Nonweightbearing in right lower extremity -Sutures: Intact.  No clinical signs of dehiscence noted.  No complication noted.  Mild erythema noted. -Medications: Doxycycline and Percocet -Foot redressed.  No follow-ups on file.

## 2021-09-19 ENCOUNTER — Other Ambulatory Visit: Payer: Self-pay

## 2021-09-19 ENCOUNTER — Inpatient Hospital Stay: Payer: BC Managed Care – PPO | Attending: Oncology

## 2021-09-19 ENCOUNTER — Inpatient Hospital Stay: Payer: BC Managed Care – PPO

## 2021-09-19 VITALS — BP 132/88 | HR 104 | Temp 97.4°F | Resp 18

## 2021-09-19 DIAGNOSIS — D751 Secondary polycythemia: Secondary | ICD-10-CM

## 2021-09-19 DIAGNOSIS — Z79899 Other long term (current) drug therapy: Secondary | ICD-10-CM | POA: Insufficient documentation

## 2021-09-19 LAB — CBC WITH DIFFERENTIAL/PLATELET
Abs Immature Granulocytes: 0.06 10*3/uL (ref 0.00–0.07)
Basophils Absolute: 0.1 10*3/uL (ref 0.0–0.1)
Basophils Relative: 1 %
Eosinophils Absolute: 0.1 10*3/uL (ref 0.0–0.5)
Eosinophils Relative: 1 %
HCT: 49.9 % (ref 39.0–52.0)
Hemoglobin: 16.4 g/dL (ref 13.0–17.0)
Immature Granulocytes: 1 %
Lymphocytes Relative: 20 %
Lymphs Abs: 1.6 10*3/uL (ref 0.7–4.0)
MCH: 26.5 pg (ref 26.0–34.0)
MCHC: 32.9 g/dL (ref 30.0–36.0)
MCV: 80.7 fL (ref 80.0–100.0)
Monocytes Absolute: 0.7 10*3/uL (ref 0.1–1.0)
Monocytes Relative: 9 %
Neutro Abs: 5.5 10*3/uL (ref 1.7–7.7)
Neutrophils Relative %: 68 %
Platelets: 252 10*3/uL (ref 150–400)
RBC: 6.18 MIL/uL — ABNORMAL HIGH (ref 4.22–5.81)
RDW: 13.2 % (ref 11.5–15.5)
WBC: 8 10*3/uL (ref 4.0–10.5)
nRBC: 0 % (ref 0.0–0.2)

## 2021-09-19 LAB — COMPREHENSIVE METABOLIC PANEL
ALT: 48 U/L — ABNORMAL HIGH (ref 0–44)
AST: 32 U/L (ref 15–41)
Albumin: 3.6 g/dL (ref 3.5–5.0)
Alkaline Phosphatase: 60 U/L (ref 38–126)
Anion gap: 8 (ref 5–15)
BUN: 19 mg/dL (ref 8–23)
CO2: 26 mmol/L (ref 22–32)
Calcium: 8.8 mg/dL — ABNORMAL LOW (ref 8.9–10.3)
Chloride: 101 mmol/L (ref 98–111)
Creatinine, Ser: 0.87 mg/dL (ref 0.61–1.24)
GFR, Estimated: >60 mL/min (ref >60)
Glucose, Bld: 263 mg/dL — ABNORMAL HIGH (ref 70–99)
Potassium: 4.1 mmol/L (ref 3.5–5.1)
Sodium: 135 mmol/L (ref 135–145)
Total Bilirubin: 1.2 mg/dL (ref 0.3–1.2)
Total Protein: 6.7 g/dL (ref 6.5–8.1)

## 2021-09-19 NOTE — Progress Notes (Signed)
HCT 49.9 . Discussed result with patient. He would like to proceed with phlebotomy today. Parameter states if HCT 50 and above. 500 ml blood removed per orders. Tolerated procedure well. Discharge to home.

## 2021-09-19 NOTE — Patient Instructions (Signed)

## 2021-09-20 ENCOUNTER — Ambulatory Visit (INDEPENDENT_AMBULATORY_CARE_PROVIDER_SITE_OTHER): Payer: BC Managed Care – PPO | Admitting: Podiatry

## 2021-09-20 DIAGNOSIS — Z9889 Other specified postprocedural states: Secondary | ICD-10-CM

## 2021-09-24 ENCOUNTER — Other Ambulatory Visit: Payer: Self-pay | Admitting: Family Medicine

## 2021-09-24 DIAGNOSIS — R0981 Nasal congestion: Secondary | ICD-10-CM

## 2021-09-24 NOTE — Telephone Encounter (Signed)
Requested medication (s) are due for refill today: -  Requested medication (s) are on the active medication list: no  Last refill:  03/25/21 #90 1 RF  Future visit scheduled: yes  Notes to clinic:  Med was dc'd 04/01/21 "error" - pt requesting med   Requested Prescriptions  Pending Prescriptions Disp Refills   montelukast (SINGULAIR) 10 MG tablet [Pharmacy Med Name: MONTELUKAST 10MG  TABLETS] 90 tablet 1    Sig: TAKE 1 TABLET(10 MG) BY MOUTH DAILY AS NEEDED     Pulmonology:  Leukotriene Inhibitors Passed - 09/24/2021 10:49 AM      Passed - Valid encounter within last 12 months    Recent Outpatient Visits           5 months ago Type 2 diabetes mellitus with other specified complication, without long-term current use of insulin ([place])   [facility] [place], Dionne Bucy, MD   8 months ago Rotator cuff arthropathy of left shoulder   [facility] [place], Dionne Bucy, MD   12 months ago Hypertension associated with diabetes [facility])   [place], Dionne Bucy, MD   1 year ago Type 2 diabetes mellitus with other specified complication, without long-term current use of insulin [facility])   [place], Bov[place], PA-C   1 year ago Type 2 diabetes mellitus with other specified complication, without long-term current use of insulin [facility])   [place], Wendee Beavers, [place]       Future Appointments             In 3 weeks [name], Dionne Bucy, MD [facility], [place]   In 4 months [place], Ronda Fairly, MD [place]

## 2021-09-27 ENCOUNTER — Other Ambulatory Visit: Payer: Self-pay

## 2021-09-27 ENCOUNTER — Encounter: Payer: Self-pay | Admitting: Podiatry

## 2021-09-27 ENCOUNTER — Ambulatory Visit: Payer: BC Managed Care – PPO | Admitting: Podiatry

## 2021-09-27 VITALS — Temp 98.3°F

## 2021-09-27 DIAGNOSIS — Z9889 Other specified postprocedural states: Secondary | ICD-10-CM

## 2021-09-27 DIAGNOSIS — M25371 Other instability, right ankle: Secondary | ICD-10-CM

## 2021-09-27 NOTE — Progress Notes (Signed)
HPI: 65 y.o. male status post lateral ankle stabilization procedure.  DOS: 09/05/2021.  Patient states that overall he is doing well.  He is back into tennis shoes and walking.  He does have some slight sensitivity but overall doing well.  No new complaints at this time  Past Medical History:  Diagnosis Date   BPH (benign prostatic hyperplasia)    Bronchitis    Complication of anesthesia    hard to wake up   DM (diabetes mellitus), type 2 ([place])    DVT (deep venous thrombosis) ([employer])    post op   ED (erectile dysfunction)    Erythrocytosis 07/04/2020   GERD (gastroesophageal reflux disease)    Hyperlipemia    Hypertension    Hypogonadism in male    Nocturia    Right ankle instability    Sleep apnea    TB lung, latent    Urinary frequency      Physical Exam: General: The patient is alert and oriented x3 in no acute distress.  Dermatology: Skin is warm, dry and supple bilateral lower extremities. Negative for open lesions or macerations.  Hyperkeratotic skin lesion noted to the plantar aspect of the fifth MTPJ left foot  Vascular: Palpable pedal pulses bilaterally. No edema or erythema noted. Capillary refill within normal limits.  Neurological: Epicritic and protective threshold grossly intact bilaterally.   Musculoskeletal Exam: Range of motion within normal limits to all pedal and ankle joints bilateral. Muscle strength 5/5 in all groups bilateral.  Cavus foot deformity noted with calcaneal varus upon weightbearing and with physical exam.  Lateral column loading also noted.  This is likely contributing to the ankle instability.    Skin incisions are well coapted with exception of a small area along the central portion of about 1 cm which has some slight dehiscence.  This appears very superficial and should heal routinely.  Radiographic Exam RT ankle 06/11/2021:  Degenerative changes noted to the tibiotalar joint with slight talar tilt and increased space to the lateral gutter of  the tibiotalar joint.  There are two orthopedic screws noted to the posterior calcaneal tubercle.  Screws appear stable and intact.  They do not protrude into the subtalar joint.  There does appear to be some varus deformity of the calcaneus.  There is also an orthopedic plate with 5 respective screws along the proximal portion of the first metatarsal.  This does not cross the TMT.  It is completely localized within the first metatarsal.  It appears stable and intact.  No fractures identified.  Joint spaces mostly preserved except for the degenerative changes noted  Assessment: 1.  Benign skin lesion subfifth MTPJ left foot; improved 2.  DJD right ankle 3.  Cavus foot deformity right 4.  Chronic ankle instability right with recurrent ankle sprains   Plan of Care:  1. Patient evaluated.   2.  Patient continues to have a small area of dehiscence about 1 cm in length.  This looks very stable and should heal routinely.  Continue antibiotic cream and a light Band-Aid daily 3.  Continue Ace wraps or ankle brace daily.  An additional ankle brace was provided today 4.  Patient may slowly increase to full activity no restrictions. 5.  The patient clearly understands that the major deforming force of his foot is the varus heel.  He also understands that it would be a more invasive surgery with a longer postoperative recovery to correct for the cavovarus foot type.  The lateral ankle stabilization  repair was to provide some stability, perhaps enough to where we can avoid the larger surgery 6.  Return to clinic 4 weeks      Edrick Kins, DPM [facility]  Dr. Edrick Kins, DPM    [address],  [address]                Office [phone]  Fax [phone]

## 2021-09-30 ENCOUNTER — Telehealth: Payer: Self-pay | Admitting: Podiatry

## 2021-09-30 ENCOUNTER — Encounter: Payer: Self-pay | Admitting: Podiatry

## 2021-09-30 NOTE — Telephone Encounter (Signed)
That's fine. Thanks!

## 2021-09-30 NOTE — Telephone Encounter (Signed)
Called pt let him know his note was ready for pick up.

## 2021-09-30 NOTE — Telephone Encounter (Signed)
Patient called wanting a return to work note to go back tomorrow. Let pt know I would reach out to Dr Amalia Hailey. Will write note if ok'd by Dr Amalia Hailey.

## 2021-10-01 ENCOUNTER — Ambulatory Visit (INDEPENDENT_AMBULATORY_CARE_PROVIDER_SITE_OTHER): Payer: BC Managed Care – PPO | Admitting: Podiatry

## 2021-10-01 ENCOUNTER — Other Ambulatory Visit: Payer: Self-pay

## 2021-10-01 DIAGNOSIS — M25371 Other instability, right ankle: Secondary | ICD-10-CM

## 2021-10-01 DIAGNOSIS — Z9889 Other specified postprocedural states: Secondary | ICD-10-CM

## 2021-10-01 NOTE — Progress Notes (Signed)
HPI: 65 y.o. male status post lateral ankle stabilization procedure.  DOS: 09/05/2021.  Patient states that overall he is doing well.  Patient returns today because he had some questions associated to the small area of dehiscence on the lateral aspect of the ankle and the postoperative recovery process.  Past Medical History:  Diagnosis Date   BPH (benign prostatic hyperplasia)    Bronchitis    Complication of anesthesia    hard to wake up   DM (diabetes mellitus), type 2 ([place])    DVT (deep venous thrombosis) ([place])    post op   ED (erectile dysfunction)    Erythrocytosis 07/04/2020   GERD (gastroesophageal reflux disease)    Hyperlipemia    Hypertension    Hypogonadism in male    Nocturia    Right ankle instability    Sleep apnea    TB lung, latent    Urinary frequency      Physical Exam: General: The patient is alert and oriented x3 in no acute distress.  Dermatology: Skin is warm, dry and supple bilateral lower extremities. Negative for open lesions or macerations.  Hyperkeratotic skin lesion noted to the plantar aspect of the fifth MTPJ left foot  Vascular: Palpable pedal pulses bilaterally. No edema or erythema noted. Capillary refill within normal limits.  Neurological: Epicritic and protective threshold grossly intact bilaterally.   Musculoskeletal Exam: Range of motion within normal limits to all pedal and ankle joints bilateral. Muscle strength 5/5 in all groups bilateral.  Cavus foot deformity noted with calcaneal varus upon weightbearing and with physical exam.  Lateral column loading also noted.  This is likely contributing to the ankle instability.    Skin incisions are well coapted with exception of a small area along the central portion of about 1 cm which has some slight dehiscence.  This appears very superficial and should heal routinely.  Radiographic Exam RT ankle 06/11/2021:  Degenerative changes noted to the tibiotalar joint with slight talar tilt and  increased space to the lateral gutter of the tibiotalar joint.  There are two orthopedic screws noted to the posterior calcaneal tubercle.  Screws appear stable and intact.  They do not protrude into the subtalar joint.  There does appear to be some varus deformity of the calcaneus.  There is also an orthopedic plate with 5 respective screws along the proximal portion of the first metatarsal.  This does not cross the TMT.  It is completely localized within the first metatarsal.  It appears stable and intact.  No fractures identified.  Joint spaces mostly preserved except for the degenerative changes noted  Assessment: 1.  Benign skin lesion subfifth MTPJ left foot; improved 2.  DJD right ankle 3.  Cavus foot deformity right 4.  Chronic ankle instability right with recurrent ankle sprains   Plan of Care:  1. Patient evaluated.  All patient questions were answered today. 2.  Patient continues to have a small area of dehiscence about 1 cm in length.  This is significantly improved since last week's visit.  This looks very stable and should heal routinely.  Recommend Steri-Strips to help squeeze the incision site together.  Steri-Strips provided 3.  Continue ankle brace daily. 4.  Patient may continue to slowly increase to full activity no restrictions. 5.  The patient clearly understands that the major deforming force of his foot is the varus heel.  He also understands that it would be a more invasive surgery with a longer postoperative recovery to correct  for the cavovarus foot type.  The lateral ankle stabilization repair was to provide some stability, perhaps enough to where we can avoid the larger surgery 6.  Return to clinic 4 weeks      Edrick Kins, DPM [facility]  Dr. Edrick Kins, DPM    [address], [place] [address]                Office [phone]  Fax [phone]

## 2021-10-04 ENCOUNTER — Encounter: Payer: BC Managed Care – PPO | Admitting: Podiatry

## 2021-10-06 NOTE — Progress Notes (Signed)
   Subjective:  Patient presents today status post lateral ankle stabilization procedure. DOS: 09/05/2021.  Patient states that he is doing well.  He has some minimal pain here and there.  There is some minor drainage coming from the incision site.  No active bleeding.  He continues to have some swelling to the ankle.  Past Medical History:  Diagnosis Date   BPH (benign prostatic hyperplasia)    Bronchitis    Complication of anesthesia    hard to wake up   DM (diabetes mellitus), type 2 ([place])    DVT (deep venous thrombosis) ([place])    post op   ED (erectile dysfunction)    Erythrocytosis 07/04/2020   GERD (gastroesophageal reflux disease)    Hyperlipemia    Hypertension    Hypogonadism in male    Nocturia    Right ankle instability    Sleep apnea    TB lung, latent    Urinary frequency       Objective/Physical Exam Neurovascular status intact.  Skin incisions appear to be well coapted with sutures intact with exception of a small area of dehiscence about 1.5 cm in length along the incision site the central portion.. No sign of infectious process noted. No dehiscence. No active bleeding noted. Moderate edema noted to the surgical extremity.   Assessment: 1. s/p right lateral ankle stabilization procedure. DOS: 09/05/2021   Plan of Care:  1. Patient was evaluated. 2.  Sutures removed today. 3.  Discontinue the cam boot.  Patient may now transition out of the cam boot into good supportive sneakers 4.  Recommend Silvadene cream to the incision site with a light dressing daily.  Silvadene cream provided 5.  Continue Ace wrap daily 6.  Return to clinic in 2 weeks   Edrick Kins, DPM [facility]  Dr. Edrick Kins, DPM    [address],  [address]                Office [phone]  Fax [phone]

## 2021-10-11 ENCOUNTER — Ambulatory Visit (INDEPENDENT_AMBULATORY_CARE_PROVIDER_SITE_OTHER): Payer: BC Managed Care – PPO | Admitting: Podiatry

## 2021-10-11 ENCOUNTER — Other Ambulatory Visit: Payer: Self-pay

## 2021-10-11 DIAGNOSIS — Z9889 Other specified postprocedural states: Secondary | ICD-10-CM

## 2021-10-11 MED ORDER — DOXYCYCLINE HYCLATE 100 MG PO TABS: 100.0000 mg | ORAL_TABLET | Freq: Two times a day (BID) | ORAL | 0 refills | Status: DC

## 2021-10-11 MED ORDER — GENTAMICIN SULFATE 0.1 % EX CREA: 1.0000 "application " | TOPICAL_CREAM | Freq: Two times a day (BID) | CUTANEOUS | 1 refills | Status: DC

## 2021-10-11 NOTE — Progress Notes (Signed)
HPI: 65 y.o. male status post lateral ankle stabilization procedure.  DOS: 09/05/2021.  Patient states that overall he is doing well.  Patient returns today because he had some questions associated to the small area of dehiscence on the lateral aspect of the ankle which has become slightly red and irritated.  He has been wearing hightop lace up shoes which may be rubbing and irritating the small dehiscence site.  Past Medical History:  Diagnosis Date   BPH (benign prostatic hyperplasia)    Bronchitis    Complication of anesthesia    hard to wake up   DM (diabetes mellitus), type 2 ([place])    DVT (deep venous thrombosis) ([place])    post op   ED (erectile dysfunction)    Erythrocytosis 07/04/2020   GERD (gastroesophageal reflux disease)    Hyperlipemia    Hypertension    Hypogonadism in male    Nocturia    Right ankle instability    Sleep apnea    TB lung, latent    Urinary frequency      Physical Exam: General: The patient is alert and oriented x3 in no acute distress.  Dermatology: Small area of dehiscencealong the central portion of about 1 cm which has some slight dehiscence.  This appears very superficial and should heal routinely.  There is some small slight erythema around the area as well  Vascular: Palpable pedal pulses bilaterally. No edema or erythema noted. Capillary refill within normal limits.  Neurological: Epicritic and protective threshold grossly intact bilaterally.   Musculoskeletal Exam: Range of motion within normal limits to all pedal and ankle joints bilateral. Muscle strength 5/5 in all groups bilateral.  Cavus foot deformity noted with calcaneal varus upon weightbearing and with physical exam.  Lateral column loading also noted.  This is likely contributing to the ankle instability.    Radiographic Exam RT ankle 06/11/2021:  Degenerative changes noted to the tibiotalar joint with slight talar tilt and increased space to the lateral gutter of the tibiotalar  joint.  There are two orthopedic screws noted to the posterior calcaneal tubercle.  Screws appear stable and intact.  They do not protrude into the subtalar joint.  There does appear to be some varus deformity of the calcaneus.  There is also an orthopedic plate with 5 respective screws along the proximal portion of the first metatarsal.  This does not cross the TMT.  It is completely localized within the first metatarsal.  It appears stable and intact.  No fractures identified.  Joint spaces mostly preserved except for the degenerative changes noted  Assessment: 1.  Benign skin lesion subfifth MTPJ left foot; improved 2.  DJD right ankle 3.  Cavus foot deformity right 4.  Chronic ankle instability right with recurrent ankle sprains   Plan of Care:  1. Patient evaluated.  The small area of dehiscence is likely getting irritated from the lace up hightop tennis shoes which are rubbing directly to the lateral malleolus. 2.  Recommend low top shoes that do not irritate the ankle area 3.  Just to ensure there is no infection I did call in a prescription for doxycycline 100 mg 3 times daily #20 4.  Prescription for gentamicin cream apply 2 times daily 5.  Return to clinic in 2 weeks     Edrick Kins, DPM [facility]  Dr. Edrick Kins, DPM    [address]  [place], [place] [address]                Office [phone]  Fax [phone]

## 2021-10-15 ENCOUNTER — Ambulatory Visit: Payer: Self-pay | Admitting: Family Medicine

## 2021-10-18 ENCOUNTER — Other Ambulatory Visit: Payer: Self-pay

## 2021-10-18 ENCOUNTER — Inpatient Hospital Stay: Payer: BC Managed Care – PPO | Attending: Oncology

## 2021-10-18 ENCOUNTER — Inpatient Hospital Stay: Payer: BC Managed Care – PPO

## 2021-10-18 DIAGNOSIS — D751 Secondary polycythemia: Secondary | ICD-10-CM

## 2021-10-18 LAB — CBC WITH DIFFERENTIAL/PLATELET
Abs Immature Granulocytes: 0.02 10*3/uL (ref 0.00–0.07)
Basophils Absolute: 0 10*3/uL (ref 0.0–0.1)
Basophils Relative: 1 %
Eosinophils Absolute: 0.1 10*3/uL (ref 0.0–0.5)
Eosinophils Relative: 2 %
HCT: 45.6 % (ref 39.0–52.0)
Hemoglobin: 15 g/dL (ref 13.0–17.0)
Immature Granulocytes: 0 %
Lymphocytes Relative: 23 %
Lymphs Abs: 1.4 10*3/uL (ref 0.7–4.0)
MCH: 25.8 pg — ABNORMAL LOW (ref 26.0–34.0)
MCHC: 32.9 g/dL (ref 30.0–36.0)
MCV: 78.5 fL — ABNORMAL LOW (ref 80.0–100.0)
Monocytes Absolute: 0.7 10*3/uL (ref 0.1–1.0)
Monocytes Relative: 11 %
Neutro Abs: 3.9 10*3/uL (ref 1.7–7.7)
Neutrophils Relative %: 63 %
Platelets: 213 10*3/uL (ref 150–400)
RBC: 5.81 MIL/uL (ref 4.22–5.81)
RDW: 13.5 % (ref 11.5–15.5)
WBC: 6.2 10*3/uL (ref 4.0–10.5)
nRBC: 0 % (ref 0.0–0.2)

## 2021-10-18 NOTE — Progress Notes (Signed)
HCT < 50 today. Per MD parameters no phlebotomy required today. Pt informed of above and a print out of next appts given to him.

## 2021-10-22 ENCOUNTER — Other Ambulatory Visit: Payer: Self-pay

## 2021-10-22 ENCOUNTER — Ambulatory Visit (INDEPENDENT_AMBULATORY_CARE_PROVIDER_SITE_OTHER): Payer: BC Managed Care – PPO | Admitting: Podiatry

## 2021-10-22 DIAGNOSIS — Z9889 Other specified postprocedural states: Secondary | ICD-10-CM

## 2021-10-29 ENCOUNTER — Ambulatory Visit: Payer: BC Managed Care – PPO | Admitting: Podiatry

## 2021-10-30 NOTE — Progress Notes (Signed)
HPI: 65 y.o. male status post lateral ankle stabilization procedure.  DOS: 09/05/2021.  Patient states that overall he is doing well.  Patient states that overall he is doing very well.  He has been wearing shoes that are not rubbing or irritating the incision site.  He presents for follow-up treatment and evaluation Past Medical History:  Diagnosis Date   BPH (benign prostatic hyperplasia)    Bronchitis    Complication of anesthesia    hard to wake up   DM (diabetes mellitus), type 2 ([place])    DVT (deep venous thrombosis) ([place])    post op   ED (erectile dysfunction)    Erythrocytosis 07/04/2020   GERD (gastroesophageal reflux disease)    Hyperlipemia    Hypertension    Hypogonadism in male    Nocturia    Right ankle instability    Sleep apnea    TB lung, latent    Urinary frequency      Physical Exam: General: The patient is alert and oriented x3 in no acute distress.  Dermatology: Small area of dehiscencealong the central portion of about 1 cm which has some slight dehiscence.  This appears very superficial and should heal routinely.  There is some small slight erythema around the area as well  Vascular: Palpable pedal pulses bilaterally. No edema or erythema noted. Capillary refill within normal limits.  Neurological: Epicritic and protective threshold grossly intact bilaterally.   Musculoskeletal Exam: Range of motion within normal limits to all pedal and ankle joints bilateral. Muscle strength 5/5 in all groups bilateral.  Cavus foot deformity noted with calcaneal varus upon weightbearing and with physical exam.  Lateral column loading also noted.  This is likely contributing to the ankle instability.    Radiographic Exam RT ankle 06/11/2021:  Degenerative changes noted to the tibiotalar joint with slight talar tilt and increased space to the lateral gutter of the tibiotalar joint.  There are two orthopedic screws noted to the posterior calcaneal tubercle.  Screws appear stable  and intact.  They do not protrude into the subtalar joint.  There does appear to be some varus deformity of the calcaneus.  There is also an orthopedic plate with 5 respective screws along the proximal portion of the first metatarsal.  This does not cross the TMT.  It is completely localized within the first metatarsal.  It appears stable and intact.  No fractures identified.  Joint spaces mostly preserved except for the degenerative changes noted  Assessment: 1.  Benign skin lesion subfifth MTPJ left foot; improved 2.  DJD right ankle 3.  Cavus foot deformity right 4.  Chronic ankle instability right with recurrent ankle sprains 5.  Small area of dehiscence along the incision site right   Plan of Care:  1. Patient evaluated.  The small area of dehiscence is improving significantly.  Today collagen wound dressing, Puracol, was provided for the patient to apply within the wound bed daily with a light dressing 2.  Patient may slowly increase to full activity no restrictions 3.  Recommend wearing good supportive sneakers that do not irritate the lateral aspect of the ankle 4.  Return to clinic in 2 weeks  Edrick Kins, DPM [facility]  Dr. Edrick Kins, DPM    [address]  [place], [place] [address]                Office [phone]  Fax [phone]

## 2021-10-31 ENCOUNTER — Ambulatory Visit (INDEPENDENT_AMBULATORY_CARE_PROVIDER_SITE_OTHER): Payer: BC Managed Care – PPO | Admitting: Family Medicine

## 2021-10-31 ENCOUNTER — Other Ambulatory Visit: Payer: Self-pay

## 2021-10-31 ENCOUNTER — Encounter: Payer: Self-pay | Admitting: Family Medicine

## 2021-10-31 VITALS — BP 158/99 | HR 87 | Temp 98.3°F | Wt 234.8 lb

## 2021-10-31 DIAGNOSIS — I152 Hypertension secondary to endocrine disorders: Secondary | ICD-10-CM

## 2021-10-31 DIAGNOSIS — G5601 Carpal tunnel syndrome, right upper limb: Secondary | ICD-10-CM

## 2021-10-31 DIAGNOSIS — Z6830 Body mass index (BMI) 30.0-30.9, adult: Secondary | ICD-10-CM

## 2021-10-31 DIAGNOSIS — E1159 Type 2 diabetes mellitus with other circulatory complications: Secondary | ICD-10-CM | POA: Diagnosis not present

## 2021-10-31 DIAGNOSIS — Z0000 Encounter for general adult medical examination without abnormal findings: Secondary | ICD-10-CM | POA: Diagnosis not present

## 2021-10-31 DIAGNOSIS — Z1211 Encounter for screening for malignant neoplasm of colon: Secondary | ICD-10-CM | POA: Diagnosis not present

## 2021-10-31 DIAGNOSIS — E1169 Type 2 diabetes mellitus with other specified complication: Secondary | ICD-10-CM

## 2021-10-31 DIAGNOSIS — E785 Hyperlipidemia, unspecified: Secondary | ICD-10-CM

## 2021-10-31 DIAGNOSIS — E66811 Obesity, class 1: Secondary | ICD-10-CM

## 2021-10-31 DIAGNOSIS — Z23 Encounter for immunization: Secondary | ICD-10-CM

## 2021-10-31 DIAGNOSIS — E669 Obesity, unspecified: Secondary | ICD-10-CM

## 2021-10-31 MED ORDER — METFORMIN HCL ER 500 MG PO TB24: 1000.0000 mg | ORAL_TABLET | Freq: Two times a day (BID) | ORAL | 1 refills | Status: DC

## 2021-10-31 MED ORDER — GABAPENTIN 300 MG PO CAPS: 300.0000 mg | ORAL_CAPSULE | Freq: Every day | ORAL | 3 refills | Status: DC

## 2021-10-31 MED ORDER — HYDROCHLOROTHIAZIDE 12.5 MG PO TABS: 12.5000 mg | ORAL_TABLET | Freq: Every day | ORAL | 3 refills | Status: DC

## 2021-10-31 NOTE — Progress Notes (Signed)
Complete physical exam   Patient: Jonathan Fields   DOB: 1956-01-27   65 y.o. Male  MRN: [id number] Visit Date: 10/31/2021  Today's healthcare provider: Lavon Paganini, MD   Chief Complaint  Patient presents with   Annual Exam   Subjective    Jonathan Fields is a 65 y.o. male who presents today for a complete physical exam.  He reports consuming a general diet. Gym/ health club routine includes light weights. He generally feels well. He reports sleeping fairly well. He does not have additional problems to discuss today.  HPI   Stopped taking jardiance, atorvastatin and other meds as he feels better off of them   Past Medical History:  Diagnosis Date   BPH (benign prostatic hyperplasia)    Bronchitis    Complication of anesthesia    hard to wake up   DM (diabetes mellitus), type 2 ([place])    DVT (deep venous thrombosis) ([place])    post op   ED (erectile dysfunction)    Erythrocytosis 07/04/2020   GERD (gastroesophageal reflux disease)    Hyperlipemia    Hypertension    Hypogonadism in male    Nocturia    Right ankle instability    Sleep apnea    TB lung, latent    Urinary frequency    Past Surgical History:  Procedure Laterality Date   ANKLE SURGERY Left    torn ligament   CARPAL TUNNEL RELEASE Right 08/2019   COLONOSCOPY  2009   KNEE ARTHROSCOPY Left    right ankle ruptured tendon surgery  2008   TRANSURETHRAL RESECTION OF PROSTATE N/A 02/19/2018   Procedure: TRANSURETHRAL RESECTION OF THE PROSTATE (TURP);  Surgeon: Jonathan Retort, MD;  Location: [facility] ORS;  Service: Urology;  Laterality: N/A;   Social History   Socioeconomic History   Marital status: Married    Spouse name: Not on file   Number of children: 0   Years of education: Not on file   Highest education level: Not on file  Occupational History   Not on file  Tobacco Use   Smoking status: Never   Smokeless tobacco: Current    Types: Snuff   Tobacco comments:    tobaco pouches that  produce juice  Vaping Use   Vaping Use: Never used  Substance and Sexual Activity   Alcohol use: Yes    Comment: COUPLE OF DRINKS EVERY WEEK   Drug use: No   Sexual activity: Yes    Partners: Female    Birth control/protection: None  Other Topics Concern   Not on file  Social History Narrative   Not on file   Social Determinants of Health   Financial Resource Strain: Not on file  Food Insecurity: Not on file  Transportation Needs: Not on file  Physical Activity: Not on file  Stress: Not on file  Social Connections: Not on file  Intimate Partner Violence: Not on file   Family Status  Relation Name Status   Father  Deceased   Mother  Alive   Neg Hx  (Not Specified)   Family History  Problem Relation Age of Onset   Heart disease Father    Hypertension Father    Prostate cancer Father    Heart disease Mother    Breast cancer Mother    Colon cancer Neg Hx    Allergies  Allergen Reactions   Penicillins Hives    Has patient had a PCN reaction causing immediate rash, facial/tongue/throat swelling,  SOB or lightheadedness with hypotension: yes Has patient had a PCN reaction causing severe rash involving mucus membranes or skin necrosis: no Has patient had a PCN reaction that required hospitalization: no Has patient had a PCN reaction occurring within the last 10 years: no If all of the above answers are "NO", then may proceed with Cephalosporin use.     Patient Care Team: Virginia Crews, MD as PCP - General (Family Medicine)   Medications: Outpatient Medications Prior to Visit  Medication Sig   fluticasone (FLONASE) 50 MCG/ACT nasal spray instill 1 to 2 sprays into each nostril once daily   lisinopril (ZESTRIL) 40 MG tablet TAKE 1 TABLET BY MOUTH DAILY   montelukast (SINGULAIR) 10 MG tablet TAKE 1 TABLET(10 MG) BY MOUTH DAILY AS NEEDED   MYRBETRIQ 50 MG TB24 tablet TAKE 1 TABLET(50 MG) BY MOUTH DAILY   NEEDLE, DISP, 21 G (BD SAFETYGLIDE SHIELDED NEEDLE) 21G X  1-1/2" MISC Use 21guage needle to inject Testosterone Cypionate 94ml.   omeprazole (PRILOSEC) 20 MG capsule Take 20 mg by mouth daily.    testosterone cypionate (DEPOTESTOSTERONE CYPIONATE) 200 MG/ML injection INJECT 0.5 ML IN THE MUSCLE EVERY WEEK   atorvastatin (LIPITOR) 40 MG tablet Take 1 tablet (40 mg total) by mouth daily. (Patient not taking: Reported on 10/31/2021)   doxycycline (VIBRA-TABS) 100 MG tablet Take 1 tablet (100 mg total) by mouth 2 (two) times daily. (Patient not taking: Reported on 10/31/2021)   erythromycin ophthalmic ointment Place along incisions four times daily (Patient not taking: Reported on 10/31/2021)   gentamicin cream (GARAMYCIN) 0.1 % Apply 1 application topically 2 (two) times daily. (Patient not taking: Reported on 10/31/2021)   [DISCONTINUED] empagliflozin (JARDIANCE) 25 MG TABS tablet Take 1 tablet (25 mg total) by mouth daily before breakfast. (Patient not taking: Reported on 10/31/2021)   [DISCONTINUED] oxyCODONE-acetaminophen (PERCOCET) 5-325 MG tablet Take 1 tablet by mouth every 4 (four) hours as needed for severe pain. (Patient not taking: Reported on 10/31/2021)   [DISCONTINUED] SYRINGE-NEEDLE, DISP, 3 ML 18G X 1-1/2" 3 ML MISC Draw up 79ml Testosterone Cypionate with 18 gauge needle. (Patient not taking: Reported on 10/31/2021)   No facility-administered medications prior to visit.    Review of Systems  Constitutional: Negative.   HENT: Negative.    Eyes: Negative.   Respiratory: Negative.    Cardiovascular: Negative.   Gastrointestinal: Negative.   Endocrine: Negative.   Genitourinary: Negative.   Musculoskeletal: Negative.   Skin: Negative.   Allergic/Immunologic: Negative.   Neurological: Negative.   Hematological: Negative.   Psychiatric/Behavioral: Negative.       Objective    BP (!) 158/99 (BP Location: Right Arm, Patient Position: Sitting, Cuff Size: Normal)   Pulse 87   Temp 98.3 F (36.8 C) (Oral)   Wt 234 lb 12.8 oz (106.5 kg)    SpO2 97%   BMI 30.15 kg/m    Physical Exam Vitals reviewed.  Constitutional:      General: He is not in acute distress.    Appearance: Normal appearance. He is well-developed. He is not diaphoretic.  HENT:     Head: Normocephalic and atraumatic.     Right Ear: Tympanic membrane, ear canal and external ear normal.     Left Ear: Tympanic membrane, ear canal and external ear normal.     Nose: Nose normal.     Mouth/Throat:     Mouth: Mucous membranes are moist.     Pharynx: Oropharynx is clear. No oropharyngeal exudate.  Eyes:  General: No scleral icterus.    Conjunctiva/sclera: Conjunctivae normal.     Pupils: Pupils are equal, round, and reactive to light.  Neck:     Thyroid: No thyromegaly.  Cardiovascular:     Rate and Rhythm: Normal rate and regular rhythm.     Pulses: Normal pulses.     Heart sounds: Normal heart sounds. No murmur heard. Pulmonary:     Effort: Pulmonary effort is normal. No respiratory distress.     Breath sounds: Normal breath sounds. No wheezing or rales.  Abdominal:     General: There is no distension.     Palpations: Abdomen is soft.     Tenderness: There is no abdominal tenderness.  Musculoskeletal:        General: No deformity.     Cervical back: Neck supple.     Right lower leg: No edema.     Left lower leg: No edema.  Lymphadenopathy:     Cervical: No cervical adenopathy.  Skin:    General: Skin is warm and dry.     Findings: No rash.  Neurological:     Mental Status: He is alert and oriented to person, place, and time. Mental status is at baseline.     Sensory: No sensory deficit.     Motor: No weakness.     Gait: Gait normal.  Psychiatric:        Mood and Affect: Mood normal.        Behavior: Behavior normal.        Thought Content: Thought content normal.     Diabetic Foot Exam - Simple   Simple Foot Form Diabetic Foot exam was performed with the following findings: Yes 10/31/2021  4:01 PM  Visual Inspection No deformities,  no ulcerations, no other skin breakdown bilaterally: Yes See comments: Yes Sensation Testing Intact to touch and monofilament testing bilaterally: Yes Pulse Check Posterior Tibialis and Dorsalis pulse intact bilaterally: Yes Comments R ankle with dehiscence of surgical wound, no signs of infection       Last depression screening scores PHQ 2/9 Scores 10/31/2021 04/01/2021 12/31/2020  PHQ - 2 Score 1 0 0  PHQ- 9 Score 10 5 4    Last fall risk screening Fall Risk  10/31/2021  Falls in the past year? 0  Number falls in past yr: 0  Injury with Fall? 0  Risk for fall due to : -  Follow up -   Last Audit-C alcohol use screening Alcohol Use Disorder Test (AUDIT) 10/31/2021  1. How often do you have a drink containing alcohol? 1  2. How many drinks containing alcohol do you have on a typical day when you are drinking? 0  3. How often do you have six or more drinks on one occasion? 0  AUDIT-C Score 1  Alcohol Brief Interventions/Follow-up -   A score of 3 or more in women, and 4 or more in men indicates increased risk for alcohol abuse, EXCEPT if all of the points are from question 1   No results found for any visits on 10/31/21.  Assessment & Plan    Routine Health Maintenance and Physical Exam  Exercise Activities and Dietary recommendations  Goals   None     Immunization History  Administered Date(s) Administered   Fluad Quad(high Dose 65+) 10/31/2021   Influenza,inj,Quad PF,6+ Mos 09/09/2019   Influenza-Unspecified 09/14/2020   PFIZER(Purple Top)SARS-COV-2 Vaccination 03/14/2020, 04/04/2020, 01/02/2021   Pneumococcal Conjugate-13 01/20/2016, 09/28/2020   Pneumococcal Polysaccharide-23 10/31/2021   Tdap 02/26/2016  Zoster Recombinat (Shingrix) 06/05/2017    Health Maintenance  Topic Date Due   COLONOSCOPY (Pts 45-23yrs Insurance coverage will need to be confirmed)  Never done   Zoster Vaccines- Shingrix (2 of 2) 07/31/2017   COVID-19 Vaccine (4 - Booster for Pfizer  series) 02/27/2021   OPHTHALMOLOGY EXAM  06/01/2021   HEMOGLOBIN A1C  10/02/2021   FOOT EXAM  10/31/2022   TETANUS/TDAP  02/25/2026   Pneumonia Vaccine 18+ Years old  Completed   INFLUENZA VACCINE  Completed   Hepatitis C Screening  Completed   HPV VACCINES  Aged Out    Discussed health benefits of physical activity, and encouraged him to engage in regular exercise appropriate for his age and condition.  Problem List Items Addressed This Visit       Cardiovascular and Mediastinum   Hypertension associated with diabetes ([place])    Chronic and uncontrolled Elevated blood pressure Patient was previously on HCTZ 12.5 mg daily in addition to his lisinopril, but it seems it was self discontinued He does not remember any side effects from this medication, so we will resume in addition to his current lisinopril 40 mg daily Recheck metabolic panel Follow-up in 3 months or sooner as needed Monitor home blood pressures      Relevant Medications   hydrochlorothiazide (HYDRODIURIL) 12.5 MG tablet   metFORMIN (GLUCOPHAGE XR) 500 MG 24 hr tablet     Endocrine   T2DM (type 2 diabetes mellitus) ([place])    Previously uncontrolled with hyperglycemia He is still taking his metformin currently, but he discontinued Jardiance as this caused weight loss Recheck A1c May need a second medication Associated with/complicated by HTN and HLD Stopped his statin as above Continue ACE inhibitor Updated vaccinations, foot exam Upcoming eye exam Discussed diet and exercise Recheck A1c again in 3 months      Relevant Medications   metFORMIN (GLUCOPHAGE XR) 500 MG 24 hr tablet   Other Relevant Orders   Hemoglobin A1c   Hyperlipidemia associated with type 2 diabetes mellitus ([place])    Previously uncontrolled He has self discontinued his statin Repeat FLP and CMP Goal LDL less than 70 Will likely need to resume statin      Relevant Medications   hydrochlorothiazide (HYDRODIURIL) 12.5 MG tablet    metFORMIN (GLUCOPHAGE XR) 500 MG 24 hr tablet   Other Relevant Orders   Lipid panel     Nervous and Auditory   Carpal tunnel syndrome of right wrist    Status post carpal tunnel release, but continues to have numbness in his hand Trial of gabapentin at night to see if this helps with the discomfort of the neuropathy      Relevant Medications   gabapentin (NEURONTIN) 300 MG capsule     Other   Obesity    Discussed importance of healthy weight management Discussed diet and exercise       Relevant Medications   metFORMIN (GLUCOPHAGE XR) 500 MG 24 hr tablet   Other Visit Diagnoses     Encounter for annual physical exam    -  Primary   Screen for colon cancer       Relevant Orders   Cologuard   Need for influenza vaccination       Relevant Orders   Flu Vaccine QUAD High Dose(Fluad) (Completed)   Need for 23-polyvalent pneumococcal polysaccharide vaccine       Relevant Orders   Pneumococcal polysaccharide vaccine 23-valent greater than or equal to 2yo subcutaneous/IM (Completed)  Return in about 3 months (around 01/29/2022) for chronic disease f/u.     I, Lavon Paganini, MD, have reviewed all documentation for this visit. The documentation on 10/31/21 for the exam, diagnosis, procedures, and orders are all accurate and complete.   Baylor Cortez, Dionne Bucy, MD, MPH [facility]

## 2021-10-31 NOTE — Assessment & Plan Note (Signed)
Status post carpal tunnel release, but continues to have numbness in his hand Trial of gabapentin at night to see if this helps with the discomfort of the neuropathy

## 2021-10-31 NOTE — Assessment & Plan Note (Signed)
Discussed importance of healthy weight management Discussed diet and exercise  

## 2021-10-31 NOTE — Assessment & Plan Note (Signed)
Previously uncontrolled He has self discontinued his statin Repeat FLP and CMP Goal LDL less than 70 Will likely need to resume statin

## 2021-10-31 NOTE — Assessment & Plan Note (Signed)
Chronic and uncontrolled Elevated blood pressure Patient was previously on HCTZ 12.5 mg daily in addition to his lisinopril, but it seems it was self discontinued He does not remember any side effects from this medication, so we will resume in addition to his current lisinopril 40 mg daily Recheck metabolic panel Follow-up in 3 months or sooner as needed Monitor home blood pressures

## 2021-10-31 NOTE — Assessment & Plan Note (Signed)
Previously uncontrolled with hyperglycemia He is still taking his metformin currently, but he discontinued Jardiance as this caused weight loss Recheck A1c May need a second medication Associated with/complicated by HTN and HLD Stopped his statin as above Continue ACE inhibitor Updated vaccinations, foot exam Upcoming eye exam Discussed diet and exercise Recheck A1c again in 3 months

## 2021-11-03 ENCOUNTER — Other Ambulatory Visit: Payer: Self-pay | Admitting: Urology

## 2021-11-04 DIAGNOSIS — E1169 Type 2 diabetes mellitus with other specified complication: Secondary | ICD-10-CM | POA: Diagnosis not present

## 2021-11-04 DIAGNOSIS — E785 Hyperlipidemia, unspecified: Secondary | ICD-10-CM | POA: Diagnosis not present

## 2021-11-05 ENCOUNTER — Other Ambulatory Visit: Payer: Self-pay

## 2021-11-05 ENCOUNTER — Ambulatory Visit (INDEPENDENT_AMBULATORY_CARE_PROVIDER_SITE_OTHER): Payer: BC Managed Care – PPO | Admitting: Podiatry

## 2021-11-05 ENCOUNTER — Telehealth: Payer: Self-pay | Admitting: Family Medicine

## 2021-11-05 DIAGNOSIS — Z9889 Other specified postprocedural states: Secondary | ICD-10-CM

## 2021-11-05 LAB — LIPID PANEL
Chol/HDL Ratio: 6.2 ratio — ABNORMAL HIGH (ref 0.0–5.0)
Cholesterol, Total: 222 mg/dL — ABNORMAL HIGH (ref 100–199)
HDL: 36 mg/dL — ABNORMAL LOW (ref >39)
LDL Chol Calc (NIH): 154 mg/dL — ABNORMAL HIGH (ref 0–99)
Triglycerides: 177 mg/dL — ABNORMAL HIGH (ref 0–149)
VLDL Cholesterol Cal: 32 mg/dL (ref 5–40)

## 2021-11-05 LAB — HEMOGLOBIN A1C
Est. average glucose Bld gHb Est-mCnc: 226 mg/dL
Hgb A1c MFr Bld: 9.5 % — ABNORMAL HIGH (ref 4.8–5.6)

## 2021-11-05 MED ORDER — DAPAGLIFLOZIN PROPANEDIOL 5 MG PO TABS: 5.0000 mg | ORAL_TABLET | Freq: Every day | ORAL | 0 refills | Status: DC

## 2021-11-05 NOTE — Progress Notes (Signed)
HPI: 65 y.o. male status post lateral ankle stabilization procedure.  DOS: 09/05/2021.  Patient states that overall he is doing well.  Patient states that he did have a few days of increased pain over the past weekend but otherwise is doing well.  Past Medical History:  Diagnosis Date   BPH (benign prostatic hyperplasia)    Bronchitis    Complication of anesthesia    hard to wake up   DM (diabetes mellitus), type 2 ([place])    DVT (deep venous thrombosis) ([place])    post op   ED (erectile dysfunction)    Erythrocytosis 07/04/2020   GERD (gastroesophageal reflux disease)    Hyperlipemia    Hypertension    Hypogonadism in male    Nocturia    Right ankle instability    Sleep apnea    TB lung, latent    Urinary frequency      Physical Exam: General: The patient is alert and oriented x3 in no acute distress.  Dermatology: Small area of dehiscencealong the central portion has healed completely.  Complete reepithelialization has occurred. negative for any significant erythema  Vascular: Palpable pedal pulses bilaterally. No edema or erythema noted. Capillary refill within normal limits.  Neurological: Epicritic and protective threshold grossly intact bilaterally.   Musculoskeletal Exam: Range of motion within normal limits to all pedal and ankle joints bilateral. Muscle strength 5/5 in all groups bilateral.  Cavus foot deformity noted with calcaneal varus upon weightbearing and with physical exam.  Lateral column loading also noted.  This is likely contributing to the ankle instability.    Radiographic Exam RT ankle 06/11/2021:  Degenerative changes noted to the tibiotalar joint with slight talar tilt and increased space to the lateral gutter of the tibiotalar joint.  There are two orthopedic screws noted to the posterior calcaneal tubercle.  Screws appear stable and intact.  They do not protrude into the subtalar joint.  There does appear to be some varus deformity of the calcaneus.  There  is also an orthopedic plate with 5 respective screws along the proximal portion of the first metatarsal.  This does not cross the TMT.  It is completely localized within the first metatarsal.  It appears stable and intact.  No fractures identified.  Joint spaces mostly preserved except for the degenerative changes noted  Assessment: 1.  Benign skin lesion subfifth MTPJ left foot; improved 2.  DJD right ankle 3.  Cavus foot deformity right 4.  Chronic ankle instability right with recurrent ankle sprains 5.  Small area of dehiscence along the incision site right; healed   Plan of Care:  1. Patient evaluated.  The small area of dehiscence has healed completely.   2.  Patient may continue full activity with no restrictions wearing the ankle brace and good supportive sneakers 3.  Today were going to arrange an appointment with our Pedorthist for custom molded orthotics.  The patient had orthotics several years prior and did not have good success with them.  I do believe it would be worth another attempt at finding a good pair of custom orthotics to help with his stability of the foot and ankle 4.  Return to clinic with Pedorthist for orthotic fitting and with me in 6 weeks  Edrick Kins, DPM [facility]  Dr. Edrick Kins, DPM    [address]  [place], [place] [address]                Office [phone]  Fax [phone]

## 2021-11-05 NOTE — Progress Notes (Signed)
Called pt left VM to call back. Confirm pharmacy for medication.  [facility] nurse may give results to patient if they return call to clinic, a CRM has been created.  KP

## 2021-11-05 NOTE — Telephone Encounter (Signed)
Pt given lab results per notes of Dr Brita Romp on 11/05/21.Marland Kitchen Pt verbalized understanding.  Please send Wilder Glade to [facility] in [place].  Appt made.

## 2021-11-05 NOTE — Progress Notes (Unsigned)
iga  

## 2021-11-05 NOTE — Addendum Note (Signed)
Addended by: Shawna Orleans on: 11/05/2021 04:18 PM   Modules accepted: Orders

## 2021-11-10 ENCOUNTER — Inpatient Hospital Stay
Admission: EM | Admit: 2021-11-10 | Discharge: 2021-11-19 | DRG: 857 | Disposition: A | Discharge status: Home / Self Care | Payer: BC Managed Care – PPO | Attending: Internal Medicine | Admitting: Internal Medicine

## 2021-11-10 ENCOUNTER — Emergency Department: Payer: BC Managed Care – PPO

## 2021-11-10 ENCOUNTER — Ambulatory Visit
Admission: EM | Admit: 2021-11-10 | Discharge: 2021-11-10 | Disposition: A | Discharge status: Home / Self Care | Payer: BC Managed Care – PPO

## 2021-11-10 ENCOUNTER — Other Ambulatory Visit: Payer: Self-pay

## 2021-11-10 ENCOUNTER — Encounter: Payer: Self-pay | Admitting: Emergency Medicine

## 2021-11-10 DIAGNOSIS — T8141XA Infection following a procedure, superficial incisional surgical site, initial encounter: Secondary | ICD-10-CM | POA: Diagnosis not present

## 2021-11-10 DIAGNOSIS — Y839 Surgical procedure, unspecified as the cause of abnormal reaction of the patient, or of later complication, without mention of misadventure at the time of the procedure: Secondary | ICD-10-CM | POA: Diagnosis present

## 2021-11-10 DIAGNOSIS — D751 Secondary polycythemia: Secondary | ICD-10-CM | POA: Diagnosis not present

## 2021-11-10 DIAGNOSIS — M71071 Abscess of bursa, right ankle and foot: Secondary | ICD-10-CM | POA: Diagnosis not present

## 2021-11-10 DIAGNOSIS — R601 Generalized edema: Secondary | ICD-10-CM | POA: Diagnosis not present

## 2021-11-10 DIAGNOSIS — Z20822 Contact with and (suspected) exposure to covid-19: Secondary | ICD-10-CM | POA: Diagnosis present

## 2021-11-10 DIAGNOSIS — E872 Acidosis, unspecified: Secondary | ICD-10-CM | POA: Diagnosis present

## 2021-11-10 DIAGNOSIS — I10 Essential (primary) hypertension: Secondary | ICD-10-CM | POA: Diagnosis not present

## 2021-11-10 DIAGNOSIS — M65971 Unspecified synovitis and tenosynovitis, right ankle and foot: Secondary | ICD-10-CM

## 2021-11-10 DIAGNOSIS — Y929 Unspecified place or not applicable: Secondary | ICD-10-CM

## 2021-11-10 DIAGNOSIS — E1165 Type 2 diabetes mellitus with hyperglycemia: Secondary | ICD-10-CM

## 2021-11-10 DIAGNOSIS — M659 Synovitis and tenosynovitis, unspecified: Secondary | ICD-10-CM | POA: Diagnosis present

## 2021-11-10 DIAGNOSIS — Z7984 Long term (current) use of oral hypoglycemic drugs: Secondary | ICD-10-CM | POA: Diagnosis not present

## 2021-11-10 DIAGNOSIS — M25471 Effusion, right ankle: Secondary | ICD-10-CM | POA: Diagnosis not present

## 2021-11-10 DIAGNOSIS — T8149XA Infection following a procedure, other surgical site, initial encounter: Secondary | ICD-10-CM | POA: Diagnosis not present

## 2021-11-10 DIAGNOSIS — M25571 Pain in right ankle and joints of right foot: Secondary | ICD-10-CM | POA: Diagnosis not present

## 2021-11-10 DIAGNOSIS — M25461 Effusion, right knee: Secondary | ICD-10-CM | POA: Diagnosis not present

## 2021-11-10 DIAGNOSIS — E785 Hyperlipidemia, unspecified: Secondary | ICD-10-CM | POA: Diagnosis present

## 2021-11-10 DIAGNOSIS — M216X1 Other acquired deformities of right foot: Secondary | ICD-10-CM | POA: Diagnosis not present

## 2021-11-10 DIAGNOSIS — Z880 Allergy status to penicillin: Secondary | ICD-10-CM | POA: Diagnosis not present

## 2021-11-10 DIAGNOSIS — E1169 Type 2 diabetes mellitus with other specified complication: Secondary | ICD-10-CM | POA: Diagnosis present

## 2021-11-10 DIAGNOSIS — M86171 Other acute osteomyelitis, right ankle and foot: Secondary | ICD-10-CM | POA: Diagnosis not present

## 2021-11-10 DIAGNOSIS — T387X5A Adverse effect of androgens and anabolic congeners, initial encounter: Secondary | ICD-10-CM | POA: Diagnosis not present

## 2021-11-10 DIAGNOSIS — L02611 Cutaneous abscess of right foot: Secondary | ICD-10-CM | POA: Diagnosis present

## 2021-11-10 DIAGNOSIS — K219 Gastro-esophageal reflux disease without esophagitis: Secondary | ICD-10-CM | POA: Diagnosis present

## 2021-11-10 DIAGNOSIS — M7989 Other specified soft tissue disorders: Secondary | ICD-10-CM | POA: Diagnosis not present

## 2021-11-10 DIAGNOSIS — G4733 Obstructive sleep apnea (adult) (pediatric): Secondary | ICD-10-CM | POA: Diagnosis present

## 2021-11-10 DIAGNOSIS — Z794 Long term (current) use of insulin: Secondary | ICD-10-CM

## 2021-11-10 DIAGNOSIS — S93401A Sprain of unspecified ligament of right ankle, initial encounter: Secondary | ICD-10-CM | POA: Diagnosis not present

## 2021-11-10 DIAGNOSIS — Z8249 Family history of ischemic heart disease and other diseases of the circulatory system: Secondary | ICD-10-CM

## 2021-11-10 DIAGNOSIS — Q6611 Congenital talipes calcaneovarus, right foot: Secondary | ICD-10-CM | POA: Diagnosis not present

## 2021-11-10 DIAGNOSIS — Z79899 Other long term (current) drug therapy: Secondary | ICD-10-CM | POA: Diagnosis not present

## 2021-11-10 DIAGNOSIS — L03818 Cellulitis of other sites: Secondary | ICD-10-CM

## 2021-11-10 DIAGNOSIS — M79671 Pain in right foot: Secondary | ICD-10-CM | POA: Diagnosis not present

## 2021-11-10 DIAGNOSIS — L03115 Cellulitis of right lower limb: Secondary | ICD-10-CM | POA: Diagnosis not present

## 2021-11-10 DIAGNOSIS — M00071 Staphylococcal arthritis, right ankle and foot: Secondary | ICD-10-CM | POA: Diagnosis not present

## 2021-11-10 DIAGNOSIS — B957 Other staphylococcus as the cause of diseases classified elsewhere: Secondary | ICD-10-CM | POA: Diagnosis not present

## 2021-11-10 DIAGNOSIS — M009 Pyogenic arthritis, unspecified: Secondary | ICD-10-CM

## 2021-11-10 DIAGNOSIS — M00271 Other streptococcal arthritis, right ankle and foot: Secondary | ICD-10-CM

## 2021-11-10 DIAGNOSIS — R600 Localized edema: Secondary | ICD-10-CM | POA: Diagnosis not present

## 2021-11-10 DIAGNOSIS — L02411 Cutaneous abscess of right axilla: Secondary | ICD-10-CM | POA: Diagnosis not present

## 2021-11-10 DIAGNOSIS — E1159 Type 2 diabetes mellitus with other circulatory complications: Secondary | ICD-10-CM | POA: Diagnosis present

## 2021-11-10 DIAGNOSIS — M19071 Primary osteoarthritis, right ankle and foot: Secondary | ICD-10-CM | POA: Diagnosis not present

## 2021-11-10 LAB — CBC WITH DIFFERENTIAL/PLATELET
Abs Immature Granulocytes: 0.07 10*3/uL (ref 0.00–0.07)
Abs Immature Granulocytes: 0.07 10*3/uL (ref 0.00–0.07)
Basophils Absolute: 0.1 10*3/uL (ref 0.0–0.1)
Basophils Absolute: 0 10*3/uL (ref 0.0–0.1)
Basophils Relative: 1 %
Basophils Relative: 0 %
Eosinophils Absolute: 0.1 10*3/uL (ref 0.0–0.5)
Eosinophils Absolute: 0.1 10*3/uL (ref 0.0–0.5)
Eosinophils Relative: 1 %
Eosinophils Relative: 1 %
HCT: 46.9 % (ref 39.0–52.0)
HCT: 47.4 % (ref 39.0–52.0)
Hemoglobin: 15.1 g/dL (ref 13.0–17.0)
Hemoglobin: 15.2 g/dL (ref 13.0–17.0)
Immature Granulocytes: 1 %
Immature Granulocytes: 1 %
Lymphocytes Relative: 15 %
Lymphocytes Relative: 14 %
Lymphs Abs: 1.4 10*3/uL (ref 0.7–4.0)
Lymphs Abs: 1.1 10*3/uL (ref 0.7–4.0)
MCH: 25.4 pg — ABNORMAL LOW (ref 26.0–34.0)
MCH: 25.3 pg — ABNORMAL LOW (ref 26.0–34.0)
MCHC: 32.2 g/dL (ref 30.0–36.0)
MCHC: 32.1 g/dL (ref 30.0–36.0)
MCV: 78.8 fL — ABNORMAL LOW (ref 80.0–100.0)
MCV: 79 fL — ABNORMAL LOW (ref 80.0–100.0)
Monocytes Absolute: 1.2 10*3/uL — ABNORMAL HIGH (ref 0.1–1.0)
Monocytes Absolute: 1 10*3/uL (ref 0.1–1.0)
Monocytes Relative: 12 %
Monocytes Relative: 12 %
Neutro Abs: 6.8 10*3/uL (ref 1.7–7.7)
Neutro Abs: 6 10*3/uL (ref 1.7–7.7)
Neutrophils Relative %: 70 %
Neutrophils Relative %: 72 %
Platelets: 222 10*3/uL (ref 150–400)
Platelets: 243 10*3/uL (ref 150–400)
RBC: 5.95 MIL/uL — ABNORMAL HIGH (ref 4.22–5.81)
RBC: 6 MIL/uL — ABNORMAL HIGH (ref 4.22–5.81)
RDW: 14.2 % (ref 11.5–15.5)
RDW: 14.2 % (ref 11.5–15.5)
WBC: 9.6 10*3/uL (ref 4.0–10.5)
WBC: 8.3 10*3/uL (ref 4.0–10.5)
nRBC: 0 % (ref 0.0–0.2)
nRBC: 0 % (ref 0.0–0.2)

## 2021-11-10 LAB — COMPREHENSIVE METABOLIC PANEL
ALT: 17 U/L (ref 0–44)
AST: 16 U/L (ref 15–41)
Albumin: 3.7 g/dL (ref 3.5–5.0)
Alkaline Phosphatase: 57 U/L (ref 38–126)
Anion gap: 9 (ref 5–15)
BUN: 15 mg/dL (ref 8–23)
CO2: 24 mmol/L (ref 22–32)
Calcium: 8.7 mg/dL — ABNORMAL LOW (ref 8.9–10.3)
Chloride: 100 mmol/L (ref 98–111)
Creatinine, Ser: 0.95 mg/dL (ref 0.61–1.24)
GFR, Estimated: >60 mL/min (ref >60)
Glucose, Bld: 275 mg/dL — ABNORMAL HIGH (ref 70–99)
Potassium: 4.1 mmol/L (ref 3.5–5.1)
Sodium: 133 mmol/L — ABNORMAL LOW (ref 135–145)
Total Bilirubin: 1.6 mg/dL — ABNORMAL HIGH (ref 0.3–1.2)
Total Protein: 7.2 g/dL (ref 6.5–8.1)

## 2021-11-10 LAB — LACTIC ACID, PLASMA
Lactic Acid, Venous: 2.1 mmol/L (ref 0.5–1.9)
Lactic Acid, Venous: 1.4 mmol/L (ref 0.5–1.9)

## 2021-11-10 MED ORDER — MORPHINE SULFATE (PF) 2 MG/ML IV SOLN: 2.0000 mg | INTRAVENOUS | Status: DC | PRN

## 2021-11-10 MED ORDER — SODIUM CHLORIDE 0.9 % IV SOLN
2.0000 g | Freq: Once | INTRAVENOUS | Status: AC
  Administered 2021-11-10: 2 g via INTRAVENOUS
  Filled 2021-11-10: qty 20

## 2021-11-10 MED ORDER — GADOBUTROL 1 MMOL/ML IV SOLN
10.0000 mL | Freq: Once | INTRAVENOUS | Status: AC | PRN
  Administered 2021-11-10: 10 mL via INTRAVENOUS
  Filled 2021-11-10: qty 10

## 2021-11-10 MED ORDER — SODIUM CHLORIDE 0.9 % IV BOLUS
1000.0000 mL | Freq: Once | INTRAVENOUS | Status: AC
  Administered 2021-11-10: 1000 mL via INTRAVENOUS

## 2021-11-10 MED ORDER — ONDANSETRON HCL 4 MG/2ML IJ SOLN: 4.0000 mg | Freq: Four times a day (QID) | INTRAMUSCULAR | Status: DC | PRN

## 2021-11-10 MED ORDER — ACETAMINOPHEN 325 MG PO TABS: 650.0000 mg | ORAL_TABLET | Freq: Four times a day (QID) | ORAL | Status: DC | PRN

## 2021-11-10 MED ORDER — HYDRALAZINE HCL 20 MG/ML IJ SOLN: 5.0000 mg | Freq: Four times a day (QID) | INTRAMUSCULAR | Status: DC | PRN

## 2021-11-10 MED ORDER — HYDROCODONE-ACETAMINOPHEN 5-325 MG PO TABS
1.0000 | ORAL_TABLET | ORAL | Status: DC | PRN
  Administered 2021-11-10: 2 via ORAL
  Administered 2021-11-11 (×2): 1 via ORAL
  Administered 2021-11-11 – 2021-11-12 (×3): 2 via ORAL
  Administered 2021-11-12: 1 via ORAL
  Administered 2021-11-13: 06:00:00 2 via ORAL
  Filled 2021-11-10 (×2): qty 1
  Filled 2021-11-10 (×3): qty 2
  Filled 2021-11-10: qty 1
  Filled 2021-11-10 (×2): qty 2
  Filled 2021-11-10: qty 1

## 2021-11-10 MED ORDER — VANCOMYCIN HCL 1250 MG/250ML IV SOLN
1250.0000 mg | Freq: Once | INTRAVENOUS | Status: AC
  Administered 2021-11-10: 1250 mg via INTRAVENOUS
  Filled 2021-11-10: qty 250

## 2021-11-10 MED ORDER — VANCOMYCIN HCL 1250 MG/250ML IV SOLN
1250.0000 mg | Freq: Two times a day (BID) | INTRAVENOUS | Status: DC
  Administered 2021-11-11: 1250 mg via INTRAVENOUS
  Filled 2021-11-10 (×3): qty 250

## 2021-11-10 MED ORDER — ACETAMINOPHEN 650 MG RE SUPP
650.0000 mg | Freq: Four times a day (QID) | RECTAL | Status: DC | PRN
  Filled 2021-11-10: qty 1

## 2021-11-10 MED ORDER — INSULIN ASPART 100 UNIT/ML IJ SOLN
0.0000 [IU] | INTRAMUSCULAR | Status: DC
  Administered 2021-11-11: 3 [IU] via SUBCUTANEOUS
  Administered 2021-11-11: 5 [IU] via SUBCUTANEOUS
  Filled 2021-11-10 (×2): qty 1

## 2021-11-10 MED ORDER — ONDANSETRON HCL 4 MG PO TABS: 4.0000 mg | ORAL_TABLET | Freq: Four times a day (QID) | ORAL | Status: DC | PRN

## 2021-11-10 MED ORDER — VANCOMYCIN HCL IN DEXTROSE 1-5 GM/200ML-% IV SOLN
1000.0000 mg | Freq: Once | INTRAVENOUS | Status: AC
  Administered 2021-11-10: 1000 mg via INTRAVENOUS
  Filled 2021-11-10: qty 200

## 2021-11-10 NOTE — ED Notes (Signed)
Lab called, lactic is 2.1. EDP informed.

## 2021-11-10 NOTE — ED Provider Notes (Signed)
[facility]    CSN: [id number] Arrival date & time: 11/10/21  5102      History   Chief Complaint Chief Complaint  Patient presents with   Ankle Pain    right    HPI Jonathan Fields is a 65 y.o. male.   HPI  Ankle Pain: Patient reports that he had a surgery on his ankle back in October.  He had a subsequent infection of the ankle and was treated with doxycycline back in October.  He states that the area never fully healed up well until about last week as he continued to have a bit of drainage and discharge to which she kept covered with a Band-Aid.  He states that on Thursday he got new shoes and throughout Friday he noticed that he began to have swelling and pain of his right ankle.  He then noticed a white debris in the heel of this foot with a potential foreign body that was described as white and hard.  This was removed with tweezers by his wife.  The area of skin has closed but has a white patch around it.  He states that since this time he has had continued swelling, pain and redness of the right ankle.  He states that he is not able to walk well due to this pain.  He has had chills but no known fevers or vomiting.  He does have a history of diabetes which is not well controlled.   Past Medical History:  Diagnosis Date   BPH (benign prostatic hyperplasia)    Bronchitis    Complication of anesthesia    hard to wake up   DM (diabetes mellitus), type 2 ([place])    DVT (deep venous thrombosis) ([place])    post op   ED (erectile dysfunction)    Erythrocytosis 07/04/2020   GERD (gastroesophageal reflux disease)    Hyperlipemia    Hypertension    Hypogonadism in male    Nocturia    Right ankle instability    Sleep apnea    TB lung, latent    Urinary frequency     Patient Active Problem List   Diagnosis Date Noted   Carpal tunnel syndrome of right wrist 10/31/2021   Rotator cuff arthropathy of left shoulder 09/28/2020   Erythrocytosis 07/04/2020   Hyperlipidemia  associated with type 2 diabetes mellitus ([place]) 04/09/2020   Hypogonadism in male 02/03/2020   Obesity 09/22/2019   Hypertension associated with diabetes ([place]) 09/22/2019   T2DM (type 2 diabetes mellitus) ([place]) 09/22/2019   Cervical radiculopathy 05/05/2019   Gastroesophageal reflux disease without esophagitis 03/22/2019   Sleep apnea 03/22/2019   Post-traumatic osteoarthritis of right ankle 02/15/2019   Cavovarus deformity of foot, acquired, right 02/14/2019   Peroneal tendinitis of lower leg, right 02/14/2019   Acquired right hindfoot varus 11/17/2018   BPH (benign prostatic hyperplasia) 02/19/2018   Varicose veins of both lower extremities with inflammation 04/09/2017   Chronic venous insufficiency 04/09/2017   Right ankle instability 02/21/2013    Past Surgical History:  Procedure Laterality Date   ANKLE SURGERY Left    torn ligament   CARPAL TUNNEL RELEASE Right 08/2019   COLONOSCOPY  2009   KNEE ARTHROSCOPY Left    right ankle ruptured tendon surgery  2008   TRANSURETHRAL RESECTION OF PROSTATE N/A 02/19/2018   Procedure: TRANSURETHRAL RESECTION OF THE PROSTATE (TURP);  Surgeon: [name], MD;  Location: [facility] ORS;  Service: Urology;  Laterality: N/A;  Home Medications    Prior to Admission medications   Medication Sig Start Date End Date Taking? Authorizing Provider  fluticasone (FLONASE) 50 MCG/ACT nasal spray instill 1 to 2 sprays into each nostril once daily 05/28/15  Yes [provider]  lisinopril (ZESTRIL) 40 MG tablet TAKE 1 TABLET BY MOUTH DAILY 08/07/21  Yes [name], Dionne Bucy, MD  metFORMIN (GLUCOPHAGE XR) 500 MG 24 hr tablet Take 2 tablets (1,000 mg total) by mouth in the morning and at bedtime. 10/31/21  Yes [name], Dionne Bucy, MD  omeprazole (PRILOSEC) 20 MG capsule Take 20 mg by mouth daily.    Yes [provider]  testosterone cypionate (DEPOTESTOSTERONE CYPIONATE) 200 MG/ML injection INJECT 0.5 ML IN THE MUSCLE EVERY WEEK  03/29/21  Yes Stoioff, Ronda Fairly, MD  atorvastatin (LIPITOR) 40 MG tablet Take 1 tablet (40 mg total) by mouth daily. Patient not taking: Reported on 10/31/2021 04/01/21   Virginia Crews, MD  dapagliflozin propanediol (FARXIGA) 5 MG TABS tablet Take 1 tablet (5 mg total) by mouth daily before breakfast. 11/05/21   [name], Dionne Bucy, MD  doxycycline (VIBRA-TABS) 100 MG tablet Take 1 tablet (100 mg total) by mouth 2 (two) times daily. Patient not taking: Reported on 10/31/2021 10/11/21   Edrick Kins, DPM  erythromycin ophthalmic ointment Place along incisions four times daily Patient not taking: Reported on 10/31/2021 03/20/21   [provider]  gabapentin (NEURONTIN) 300 MG capsule Take 1 capsule (300 mg total) by mouth at bedtime. 10/31/21   Virginia Crews, MD  gentamicin cream (GARAMYCIN) 0.1 % Apply 1 application topically 2 (two) times daily. Patient not taking: Reported on 10/31/2021 10/11/21   Edrick Kins, DPM  hydrochlorothiazide (HYDRODIURIL) 12.5 MG tablet Take 1 tablet (12.5 mg total) by mouth daily. 10/31/21   Virginia Crews, MD  montelukast (SINGULAIR) 10 MG tablet TAKE 1 TABLET(10 MG) BY MOUTH DAILY AS NEEDED 09/24/21   [name], Dionne Bucy, MD  MYRBETRIQ 50 MG TB24 tablet TAKE 1 TABLET(50 MG) BY MOUTH DAILY 11/04/21   Stoioff, Ronda Fairly, MD  NEEDLE, DISP, 21 G (BD SAFETYGLIDE SHIELDED NEEDLE) 21G X 1-1/2" MISC Use 21guage needle to inject Testosterone Cypionate 42ml. 11/30/19   McGowan, Hunt Oris, PA-C    Family History Family History  Problem Relation Age of Onset   Heart disease Father    Hypertension Father    Prostate cancer Father    Heart disease Mother    Breast cancer Mother    Colon cancer Neg Hx     Social History Social History   Tobacco Use   Smoking status: Never   Smokeless tobacco: Current    Types: Snuff   Tobacco comments:    tobaco pouches that produce juice  Vaping Use   Vaping Use: Never used  Substance Use Topics   Alcohol  use: Yes    Comment: COUPLE OF DRINKS EVERY WEEK   Drug use: No     Allergies   Penicillins   Review of Systems Review of Systems  As stated above in HPI Physical Exam Triage Vital Signs ED Triage Vitals  Enc Vitals Group     BP 11/10/21 0918 (!) 172/103     Pulse Rate 11/10/21 0918 (!) 104     Resp 11/10/21 0918 16     Temp 11/10/21 0918 98.1 F (36.7 C)     Temp Source 11/10/21 0918 Oral     SpO2 11/10/21 0918 98 %     Weight 11/10/21  0915 225 lb (102.1 kg)     Height 11/10/21 0915 6' (1.829 m)     Head Circumference --      Peak Flow --      Pain Score 11/10/21 0914 10     Pain Loc --      Pain Edu? --      Excl. in [place]? --    No data found.  Updated Vital Signs BP (!) 172/103 (BP Location: Left Arm)   Pulse (!) 104   Temp 98.1 F (36.7 C) (Oral)   Resp 16   Ht 6' (1.829 m)   Wt 225 lb (102.1 kg)   SpO2 98%   BMI 30.52 kg/m   Physical Exam Vitals and nursing note reviewed.  Constitutional:      General: He is not in acute distress.    Appearance: Normal appearance. He is not ill-appearing, toxic-appearing or diaphoretic.  Cardiovascular:     Rate and Rhythm: Normal rate and regular rhythm.  Musculoskeletal:     Comments: There is significant lateral swelling of the right food with erythema. The area is warm, glossy, tender to palpation and fluctuant. There is a 1 inch white patch of the right heel with tenderness. No active drainage.   Skin:    General: Skin is warm.     Capillary Refill: Capillary refill takes 2 to 3 seconds.     Comments: Increased vasculature of the right foot with slightly dusky hue.    Neurological:     Mental Status: He is alert.     UC Treatments / Results  Labs (all labs ordered are listed, but only abnormal results are displayed) Labs Reviewed - No data to display  EKG   Radiology No results found.  Procedures Procedures (including critical care time)  Medications Ordered in UC Medications - No data to  display  Initial Impression / Assessment and Plan / UC Course  I have reviewed the triage vital signs and the nursing notes.  Pertinent labs & imaging results that were available during my care of the patient were reviewed by me and considered in my medical decision making (see chart for details).     New.  Significant concern regarding symptoms.  This appears to be a bad infection in an area that recently had surgery.  Paired with his uncontrolled diabetes there is significant risk of poor outcome and I discussed the importance of him going to the emergency room for further work-up as he likely will need blood cultures, OR I&D and likely IV antibiotics.  Patient asked if he could wait until Tuesday to see his orthopedist and I stated that I had significant concern that he increases his risk of mortality or bad outcome such as loss of limb should he wait any longer. He wishes to go to the ER via his wife in private vehicle transfer.  Final Clinical Impressions(s) / UC Diagnoses   Final diagnoses:  None   Discharge Instructions   None    ED Prescriptions   None    PDMP not reviewed this encounter.   Hughie Closs, [place] 11/10/21 [phone]

## 2021-11-10 NOTE — ED Provider Notes (Signed)
[facility] Emergency Department Provider Note   ____________________________________________   Event Date/Time   First MD Initiated Contact with Patient 11/10/21 1515     (approximate)  I have reviewed the triage vital signs and the nursing notes.   HISTORY  Chief Complaint Foot Pain    HPI Jonathan Fields is a 65 y.o. male patient reports to the emergency room for right ankle and foot pain.  Patient reports that he had ligament repair in October and since that time has had 2 infections in the lateral side of ankle/foot.  Both infections have been treated with antibiotics.  He reports that 4 to 5 days ago he started to have severe pain in the right outer ankle and then the area began to swell and become erythematous and painful.  Today he is noted to have obvious deformity to the right outer ankle.  Patient has a golf ball size abscess appearing area to the right outer ankle.  There is no discharge from the ankle.  He has good extremity pulses.  The foot and ankle are both erythematous with 2+ edema.  His sensation is intact.  It is hard to ascertain at this point whether this is an abscess or an infection stemming from ligament repair.  He also has a circular area x2 to the lateral aspect of right foot and 2 circular areas to the bottom of right foot that he reports hurt.  These areas are white in color.  He states that there was "something" that his wife took out with a pair of tweezers.  He is unsure what this was. Patient reports that the pain is a 7 out of 10 and it is hampering his ability to bear weight and walk.  Past Medical History:  Diagnosis Date   BPH (benign prostatic hyperplasia)    Bronchitis    Complication of anesthesia    hard to wake up   DM (diabetes mellitus), type 2 ([place])    DVT (deep venous thrombosis) ([place])    post op   ED (erectile dysfunction)    Erythrocytosis 07/04/2020   GERD (gastroesophageal reflux disease)    Hyperlipemia     Hypertension    Hypogonadism in male    Nocturia    Right ankle instability    Sleep apnea    TB lung, latent    Urinary frequency     Patient Active Problem List   Diagnosis Date Noted   Abscess of right foot 11/10/2021   Carpal tunnel syndrome of right wrist 10/31/2021   Rotator cuff arthropathy of left shoulder 09/28/2020   Erythrocytosis 07/04/2020   Hyperlipidemia associated with type 2 diabetes mellitus ([place]) 04/09/2020   Hypogonadism in male 02/03/2020   Obesity 09/22/2019   Hypertension associated with diabetes ([place]) 09/22/2019   T2DM (type 2 diabetes mellitus) ([place]) 09/22/2019   Cervical radiculopathy 05/05/2019   Gastroesophageal reflux disease without esophagitis 03/22/2019   Sleep apnea 03/22/2019   Post-traumatic osteoarthritis of right ankle 02/15/2019   Cavovarus deformity of foot, acquired, right 02/14/2019   Peroneal tendinitis of lower leg, right 02/14/2019   Acquired right hindfoot varus 11/17/2018   BPH (benign prostatic hyperplasia) 02/19/2018   Varicose veins of both lower extremities with inflammation 04/09/2017   Chronic venous insufficiency 04/09/2017   Right ankle instability 02/21/2013    Past Surgical History:  Procedure Laterality Date   ANKLE SURGERY Left    torn ligament   CARPAL TUNNEL RELEASE Right 08/2019   COLONOSCOPY  2009   KNEE ARTHROSCOPY Left    right ankle ruptured tendon surgery  2008   TRANSURETHRAL RESECTION OF PROSTATE N/A 02/19/2018   Procedure: TRANSURETHRAL RESECTION OF THE PROSTATE (TURP);  Surgeon: [name], MD;  Location: [facility] ORS;  Service: Urology;  Laterality: N/A;    Prior to Admission medications   Medication Sig Start Date End Date Taking? Authorizing Provider  atorvastatin (LIPITOR) 40 MG tablet Take 1 tablet (40 mg total) by mouth daily. Patient not taking: Reported on 10/31/2021 04/01/21   Virginia Crews, MD  dapagliflozin propanediol (FARXIGA) 5 MG TABS tablet Take 1 tablet (5 mg total) by  mouth daily before breakfast. 11/05/21   [name], Dionne Bucy, MD  doxycycline (VIBRA-TABS) 100 MG tablet Take 1 tablet (100 mg total) by mouth 2 (two) times daily. Patient not taking: Reported on 10/31/2021 10/11/21   Edrick Kins, DPM  erythromycin ophthalmic ointment Place along incisions four times daily Patient not taking: Reported on 10/31/2021 03/20/21   [provider]  fluticasone Asencion Islam) 50 MCG/ACT nasal spray instill 1 to 2 sprays into each nostril once daily 05/28/15   [provider]  gabapentin (NEURONTIN) 300 MG capsule Take 1 capsule (300 mg total) by mouth at bedtime. 10/31/21   Virginia Crews, MD  gentamicin cream (GARAMYCIN) 0.1 % Apply 1 application topically 2 (two) times daily. Patient not taking: Reported on 10/31/2021 10/11/21   Edrick Kins, DPM  hydrochlorothiazide (HYDRODIURIL) 12.5 MG tablet Take 1 tablet (12.5 mg total) by mouth daily. 10/31/21   Virginia Crews, MD  lisinopril (ZESTRIL) 40 MG tablet TAKE 1 TABLET BY MOUTH DAILY 08/07/21   Virginia Crews, MD  metFORMIN (GLUCOPHAGE XR) 500 MG 24 hr tablet Take 2 tablets (1,000 mg total) by mouth in the morning and at bedtime. 10/31/21   Virginia Crews, MD  montelukast (SINGULAIR) 10 MG tablet TAKE 1 TABLET(10 MG) BY MOUTH DAILY AS NEEDED 09/24/21   Virginia Crews, MD  MYRBETRIQ 50 MG TB24 tablet TAKE 1 TABLET(50 MG) BY MOUTH DAILY 11/04/21   Stoioff, Ronda Fairly, MD  NEEDLE, DISP, 21 G (BD SAFETYGLIDE SHIELDED NEEDLE) 21G X 1-1/2" MISC Use 21guage needle to inject Testosterone Cypionate 19ml. 11/30/19   McGowan, Larene [place] A, PA-C  omeprazole (PRILOSEC) 20 MG capsule Take 20 mg by mouth daily.     [provider]  testosterone cypionate (DEPOTESTOSTERONE CYPIONATE) 200 MG/ML injection INJECT 0.5 ML IN THE MUSCLE EVERY WEEK 03/29/21   Stoioff, Ronda Fairly, MD    Allergies Penicillins  Family History  Problem Relation Age of Onset   Heart disease Father    Hypertension Father     Prostate cancer Father    Heart disease Mother    Breast cancer Mother    Colon cancer Neg Hx     Social History Social History   Tobacco Use   Smoking status: Never   Smokeless tobacco: Current    Types: Snuff   Tobacco comments:    tobaco pouches that produce juice  Vaping Use   Vaping Use: Never used  Substance Use Topics   Alcohol use: Yes    Comment: COUPLE OF DRINKS EVERY WEEK   Drug use: No    Review of Systems  Constitutional: No fever/chills Eyes: No visual changes. ENT: No sore throat. Cardiovascular: Denies chest pain. Respiratory: Denies shortness of breath. Gastrointestinal: No abdominal pain.  No nausea, no vomiting.  No diarrhea.  No constipation. Genitourinary: Negative for dysuria. Musculoskeletal: Abscess  appearing area to the right outer ankle with redness and swelling. Neurological: Negative for headaches, focal weakness or numbness.   ____________________________________________   PHYSICAL EXAM:  VITAL SIGNS: ED Triage Vitals  Enc Vitals Group     BP 11/10/21 1044 (!) 181/100     Pulse Rate 11/10/21 1044 (!) 101     Resp 11/10/21 1044 17     Temp 11/10/21 1044 98.7 F (37.1 C)     Temp Source 11/10/21 1044 Oral     SpO2 11/10/21 1044 96 %     Weight 11/10/21 1045 225 lb (102.1 kg)     Height 11/10/21 1045 6\' 2"  (1.88 m)     Head Circumference --      Peak Flow --      Pain Score --      Pain Loc --      Pain Edu? --      Excl. in [place]? --     Constitutional: Alert and oriented. Well appearing and in no acute distress. Eyes: Conjunctivae are normal. PERRL. EOMI. Head: Atraumatic. Nose: No congestion/rhinnorhea. Mouth/Throat: Mucous membranes are moist.  Oropharynx non-erythematous. Neck: No stridor.   Hematological/Lymphatic/Immunilogical: No cervical lymphadenopathy. Cardiovascular: Normal rate, regular rhythm. Grossly normal heart sounds.  Good peripheral circulation. Respiratory: Normal respiratory effort.  No retractions.  Lungs CTAB. Gastrointestinal: Soft and nontender. No distention. No abdominal bruits. No CVA tenderness. Musculoskeletal: Patient has golf ball size abscess appearing area to the right outer ankle.  There is no discharge at this time.  The right outer ankle is erythematous and painful to touch.  Patient has 2+ edema to the ankle and entire foot.  Lower extremity pulses are intact.  Sensation to bilateral lower extremities and feet is intact.  Patient does report pain with any range of motion to the right ankle. Skin: Negative for rash. Neurologic:  Normal speech and language. No gross focal neurologic deficits are appreciated. No gait instability. Skin: Please see above note under musculoskeletal assessment. Psychiatric: Mood and affect are normal. Speech and behavior are normal.  ____________________________________________   LABS (all labs ordered are listed, but only abnormal results are displayed)  Labs Reviewed  CBC WITH DIFFERENTIAL/PLATELET - Abnormal; Notable for the following components:      Result Value   RBC 5.95 (*)    MCV 78.8 (*)    MCH 25.4 (*)    Monocytes Absolute 1.2 (*)    All other components within normal limits  COMPREHENSIVE METABOLIC PANEL - Abnormal; Notable for the following components:   Sodium 133 (*)    Glucose, Bld 275 (*)    Calcium 8.7 (*)    Total Bilirubin 1.6 (*)    All other components within normal limits  CBC WITH DIFFERENTIAL/PLATELET - Abnormal; Notable for the following components:   RBC 6.00 (*)    MCV 79.0 (*)    MCH 25.3 (*)    All other components within normal limits  LACTIC ACID, PLASMA - Abnormal; Notable for the following components:   Lactic Acid, Venous 2.1 (*)    All other components within normal limits  CULTURE, BLOOD (ROUTINE X 2)  CULTURE, BLOOD (ROUTINE X 2)  LACTIC ACID, PLASMA   ____________________________________________  EKG   ____________________________________________  RADIOLOGY  ED MD interpretation:  Patient had x-ray of the right ankle and the right foot.  I have reviewed these x-rays as well as every being done by the radiologist.  See complete report below.  Official radiology report(s): DG Ankle  Complete Right  Result Date: 11/10/2021 CLINICAL DATA:  Right ankle affection. EXAM: RIGHT ANKLE - COMPLETE 3+ VIEW COMPARISON:  None. FINDINGS: No fracture or dislocation is noted. Mild osteophyte formation is seen involving the talotibial joint. Soft tissue swelling is seen overlying the lateral malleolus. Vascular calcifications are noted. IMPRESSION: Soft tissue swelling is noted overlying lateral malleolus. No acute bony abnormality is noted. Electronically Signed   By: Marijo Conception M.D.   On: 11/10/2021 11:39   CT Ankle Right Wo Contrast  Result Date: 11/10/2021 CLINICAL DATA:  Ankle pain. Right ankle soft tissue swelling and redness. EXAM: CT OF THE RIGHT ANKLE WITHOUT CONTRAST TECHNIQUE: Multidetector CT imaging of the right ankle was performed according to the standard protocol. Multiplanar CT image reconstructions were also generated. COMPARISON:  Radiograph performed on the same date. FINDINGS: Bones/Joint/Cartilage Postsurgical changes with two thicker smoker screws in the calcaneus. No acute fracture or dislocation. Arthropathy of the tibiotalar and talonavicular joints with osteophytes. Partially imaged hardware in the first metatarsal. Ligaments Suboptimally assessed by CT. Muscles and Tendons Muscles are normal in bulk.  No evidence of acute tendon tear. Soft tissues Marked subcutaneous soft tissue edema with a peripherally enhancing collection about the lateral aspect of the ankle measuring at least 5.6 x 2.9 by 3.7 cm, likely representing abscess. IMPRESSION: 1. Peripherally enhancing fluid collection in the subcutaneous soft tissues on the lateral aspect of the ankle measuring at least 5.6 x 2.9 x 3.7 cm. Generalized subcutaneous soft tissue edema about the ankle, concerning for  cellulitis. 2. Postsurgical changes about the calcaneus without evidence of acute fracture. 3.  Arthropathy of the tibiotalar and talonavicular joints. Electronically Signed   By: Keane Police D.O.   On: 11/10/2021 16:44   DG Foot Complete Right  Result Date: 11/10/2021 CLINICAL DATA:  Right foot pain and swelling. EXAM: RIGHT FOOT COMPLETE - 3+ VIEW COMPARISON:  June 11, 2021. FINDINGS: There is no evidence of fracture or dislocation. Postsurgical changes are seen involving the calcaneus and first metatarsal. Soft tissues are unremarkable. IMPRESSION: No acute abnormality seen. Electronically Signed   By: Marijo Conception M.D.   On: 11/10/2021 11:37    ____________________________________________   PROCEDURES  Procedure(s) performed: None  Procedures  Critical Care performed: No  ____________________________________________   INITIAL IMPRESSION / ASSESSMENT AND PLAN / ED COURSE  65 year old male reports to the emergency room with complaint of pain to the right ankle and right foot for the past 4 to 5 days.  Please see HPI for full explanation.   Due to the obvious abnormality to the right ankle with redness, warmth, and pain, I will order CT of the right foot and ankle. I will also obtain CBC, CMP, lactic, and blood cultures. While waiting on labs to result, I will have patient start a normal saline bolus.  CT results show fluid collection/abscess to the right outer ankle.  There is concern for cellulitis. Due to the size of the fluid collection and recent ligament repair surgery in October, I have consulted with podiatry Dr. Blenda Mounts.  She reports that she is aware of this patient i and Dr. Amalia Hailey has been notified.  She recommends that patient have an MRI and also that he be started on vancomycin and Rocephin I reviewed this plan of care with Dr. Charna Archer and he agrees.  I have ordered MRI of the right ankle as well as vancomycin and Rocephin  Will place order for consult to hospitalist  for admission. I  have notified secretary to please page hospitalist on-call.  I have repaged hospitalist on-call as I have not heard from him yet and it is 7 PM. Still not heard from hospitalist it is 8:25 PM will have secretary to page again. Patient has been taken to MRI.   9 PM report was given to hospitalist Dr. Damita Dunnings and she will accept into services for admission to not. After Merdis Delay is aware.  ____________________________________________   FINAL CLINICAL IMPRESSION(S) / ED DIAGNOSES  Final diagnoses:  Cellulitis of other specified site     ED Discharge Orders          Ordered    Consult to hospitalist       Provider:  (Not yet assigned)   11/10/21 1709             Note:  This document was prepared using Dragon voice recognition software and may include unintentional dictation errors.     Willaim Rayas, NP 11/10/21 2100    Blake Divine, MD 11/11/21 [phone]

## 2021-11-10 NOTE — ED Notes (Signed)
Patient in MRI. [place] sandwich and water provided for when pt returns from MRI per provider ok.

## 2021-11-10 NOTE — Consult Note (Signed)
PHARMACY -  BRIEF ANTIBIOTIC NOTE   Pharmacy has received consult(s) for vancomycin from an ED provider.  The patient's profile has been reviewed for ht/wt/allergies/indication/available labs.    One time order(s) placed for vancomycin 2250 mg (ordered as 1000 mg followed by 1250 mg)   Further antibiotics/pharmacy consults should be ordered by admitting physician if indicated.                       Thank you, Darnelle Bos, PharmD 11/10/2021  5:09 PM

## 2021-11-10 NOTE — ED Triage Notes (Signed)
Patient c/o right foot/ankle pain and swelling beginning Thursday. Patient reports ankle surgery on ankle in October - no issues since surgery. Obvious swelling, malformation of joint, and redness/warmth seen to site.

## 2021-11-10 NOTE — ED Notes (Signed)
Patient is being discharged from the Urgent Care and sent to the [facility] Emergency Department via private vehicle . Per Nelwyn Salisbury, PA, patient is in need of higher level of care due to severe infection in right ankle. Patient is aware and verbalizes understanding of plan of care.  Vitals:   11/10/21 0918  BP: (!) 172/103  Pulse: (!) 104  Resp: 16  Temp: 98.1 F (36.7 C)  SpO2: 98%

## 2021-11-10 NOTE — H&P (Signed)
History and Physical    Jonathan Fields:[id number] DOB: 11-28-1956 DOA: 11/10/2021  PCP: Virginia Crews, MD   Patient coming from: home  I have personally briefly reviewed patient's relevant medical records in [place]  Chief Complaint: right foot pain  HPI: Jonathan Fields is a 65 y.o. male with medical history significant for DM, HTN, BPH, OSA, cavus deformity right foot, s/p right lateral ankle stabilization procedure on 31/03/1760, complicated by 2 postoperative dehiscence treated with antibiotics but which had been constantly draining until a few days prior, who presented to the emergency room with right foot pain and swelling of the right foot that started about 5 days prior.  He was last seen by podiatry on 12/6 and was assessed to have a completely healed area of dehiscence.  He stated that the pain, redness and warmth started only after the wound had completely healed over.  He denies associated fever or chills.  The pain is about a 6/10, worse on weightbearing.  ED course: On arrival afebrile, tachycardic at 101 and BP 181/100 Blood work reviewed WBC 9.6 and lactic acid 2.1 Blood glucose 275  CT right foot: Peripherally enhancing fluid collection subcutaneous soft tissues lateral aspect ankle measuring at least 5.6 x 2.9 x 3.7 with soft tissue edema concerning for cellulitis MRI ordered, results pending  Patient started on Rocephin and vancomycin.  Hospitalist consulted for admission.   Review of Systems: As per HPI otherwise all other systems on review of systems negative.   Assessment/Plan    Postoperative cellulitis and abscess of surgical wound right ankle -S/p right ankle stabilization surgery 10/6 with postoperative dehiscence and drainage - Rocephin and vancomycin - Pain control - Podiatry consult - N.p.o. after midnight for possible procedure and SCDs for DVT prophylaxis - Follow MRI    Hyperglycemia due to type 2 diabetes mellitus (HCC) -  Sliding scale insulin coverage    Hypertension - Hydralazine as needed BP over 160/90 while n.p.o.    Cavovarus deformity of foot, acquired, right - Weightbearing recommendations per podiatry   DVT prophylaxis: SCDs Code Status: full code  Family Communication:  none  Disposition Plan: Back to previous home environment Consults called: Podiatry Status:At the time of admission, it appears that the appropriate admission status for this patient is INPATIENT. This is judged to be reasonable and necessary in order to provide the required intensity of service to ensure the patient's safety given the presenting symptoms, physical exam findings, and initial radiographic and laboratory data in the context of their  Comorbid conditions.   Patient requires inpatient status due to high intensity of service, high risk for further deterioration and high frequency of surveillance required.   I certify that at the point of admission it is my clinical judgment that the patient will require inpatient hospital care spanning beyond 2 midnights     Physical Exam: Vitals:   11/10/21 1045 11/10/21 1526 11/10/21 2034 11/10/21 2121  BP:  (!) 187/108 (!) 179/99 (!) 178/104  Pulse:  (!) 102 100 (!) 108  Resp:  16 19 18   Temp:      TempSrc:      SpO2:  97% 98% 95%  Weight: 102.1 kg     Height: 6\' 2"  (1.88 m)      Constitutional: Alert, oriented x 3 . Not in any apparent distress HEENT:      Head: Normocephalic and atraumatic.         Eyes: PERLA, EOMI, Conjunctivae are normal.  Sclera is non-icteric.       Mouth/Throat: Mucous membranes are moist.       Neck: Supple with no signs of meningismus. Cardiovascular: Regular rate and rhythm. No murmurs, gallops, or rubs. 2+ symmetrical distal pulses are present . No JVD. No  LE edema Respiratory: Respiratory effort normal .Lungs sounds clear bilaterally. No wheezes, crackles, or rhonchi.  Gastrointestinal: Soft, non tender, non distended. Positive bowel  sounds.  Genitourinary: No CVA tenderness. Musculoskeletal: Fluctuant area lateral malleolus with central area of dehiscent wound.  Unable to express fluid.  Surrounding redness and warmth and tenderness.  Otherwise nontender with normal range of motion in all extremities.  Neurologic:  Face is symmetric. Moving all extremities. No gross focal neurologic deficits . Skin: Skin is warm, dry.  No rash or ulcers Psychiatric: Mood and affect are appropriate     Past Medical History:  Diagnosis Date   BPH (benign prostatic hyperplasia)    Bronchitis    Complication of anesthesia    hard to wake up   DM (diabetes mellitus), type 2 ([place])    DVT (deep venous thrombosis) ([place])    post op   ED (erectile dysfunction)    Erythrocytosis 07/04/2020   GERD (gastroesophageal reflux disease)    Hyperlipemia    Hypertension    Hypogonadism in male    Nocturia    Right ankle instability    Sleep apnea    TB lung, latent    Urinary frequency     Past Surgical History:  Procedure Laterality Date   ANKLE SURGERY Left    torn ligament   CARPAL TUNNEL RELEASE Right 08/2019   COLONOSCOPY  2009   KNEE ARTHROSCOPY Left    right ankle ruptured tendon surgery  2008   TRANSURETHRAL RESECTION OF PROSTATE N/A 02/19/2018   Procedure: TRANSURETHRAL RESECTION OF THE PROSTATE (TURP);  Surgeon: [name], MD;  Location: [facility] ORS;  Service: Urology;  Laterality: N/A;     reports that he has never smoked. His smokeless tobacco use includes snuff. He reports current alcohol use. He reports that he does not use drugs.  Allergies  Allergen Reactions   Penicillins Hives    Has patient had a PCN reaction causing immediate rash, facial/tongue/throat swelling, SOB or lightheadedness with hypotension: yes Has patient had a PCN reaction causing severe rash involving mucus membranes or skin necrosis: no Has patient had a PCN reaction that required hospitalization: no Has patient had a PCN reaction occurring  within the last 10 years: no If all of the above answers are "NO", then may proceed with Cephalosporin use.     Family History  Problem Relation Age of Onset   Heart disease Father    Hypertension Father    Prostate cancer Father    Heart disease Mother    Breast cancer Mother    Colon cancer Neg Hx       Prior to Admission medications   Medication Sig Start Date End Date Taking? Authorizing Provider  atorvastatin (LIPITOR) 40 MG tablet Take 1 tablet (40 mg total) by mouth daily. Patient not taking: Reported on 10/31/2021 04/01/21   Virginia Crews, MD  dapagliflozin propanediol (FARXIGA) 5 MG TABS tablet Take 1 tablet (5 mg total) by mouth daily before breakfast. 11/05/21   [name], Dionne Bucy, MD  doxycycline (VIBRA-TABS) 100 MG tablet Take 1 tablet (100 mg total) by mouth 2 (two) times daily. Patient not taking: Reported on 10/31/2021 10/11/21   Edrick Kins, DPM  erythromycin ophthalmic ointment Place along incisions four times daily Patient not taking: Reported on 10/31/2021 03/20/21   [provider]  fluticasone Asencion Islam) 50 MCG/ACT nasal spray instill 1 to 2 sprays into each nostril once daily 05/28/15   [provider]  gabapentin (NEURONTIN) 300 MG capsule Take 1 capsule (300 mg total) by mouth at bedtime. 10/31/21   Virginia Crews, MD  gentamicin cream (GARAMYCIN) 0.1 % Apply 1 application topically 2 (two) times daily. Patient not taking: Reported on 10/31/2021 10/11/21   Edrick Kins, DPM  hydrochlorothiazide (HYDRODIURIL) 12.5 MG tablet Take 1 tablet (12.5 mg total) by mouth daily. 10/31/21   Virginia Crews, MD  lisinopril (ZESTRIL) 40 MG tablet TAKE 1 TABLET BY MOUTH DAILY 08/07/21   Virginia Crews, MD  metFORMIN (GLUCOPHAGE XR) 500 MG 24 hr tablet Take 2 tablets (1,000 mg total) by mouth in the morning and at bedtime. 10/31/21   Virginia Crews, MD  montelukast (SINGULAIR) 10 MG tablet TAKE 1 TABLET(10 MG) BY MOUTH DAILY AS NEEDED  09/24/21   Virginia Crews, MD  MYRBETRIQ 50 MG TB24 tablet TAKE 1 TABLET(50 MG) BY MOUTH DAILY 11/04/21   Stoioff, Ronda Fairly, MD  NEEDLE, DISP, 21 G (BD SAFETYGLIDE SHIELDED NEEDLE) 21G X 1-1/2" MISC Use 21guage needle to inject Testosterone Cypionate 85ml. 11/30/19   McGowan, Larene Beach A, PA-C  omeprazole (PRILOSEC) 20 MG capsule Take 20 mg by mouth daily.     [provider]  testosterone cypionate (DEPOTESTOSTERONE CYPIONATE) 200 MG/ML injection INJECT 0.5 ML IN THE MUSCLE EVERY WEEK 03/29/21   Abbie Sons, MD      Labs on Admission: I have personally reviewed following labs and imaging studies  CBC: Recent Labs  Lab 11/10/21 1046 11/10/21 1534  WBC 9.6 8.3  NEUTROABS 6.8 6.0  HGB 15.1 15.2  HCT 46.9 47.4  MCV 78.8* 79.0*  PLT 222 485   Basic Metabolic Panel: Recent Labs  Lab 11/10/21 1046  NA 133*  K 4.1  CL 100  CO2 24  GLUCOSE 275*  BUN 15  CREATININE 0.95  CALCIUM 8.7*   GFR: Estimated Creatinine Clearance: 96.3 mL/min (by C-G formula based on SCr of 0.95 mg/dL). Liver Function Tests: Recent Labs  Lab 11/10/21 1046  AST 16  ALT 17  ALKPHOS 57  BILITOT 1.6*  PROT 7.2  ALBUMIN 3.7   No results for input(s): LIPASE, AMYLASE in the last 168 hours. No results for input(s): AMMONIA in the last 168 hours. Coagulation Profile: No results for input(s): INR, PROTIME in the last 168 hours. Cardiac Enzymes: No results for input(s): CKTOTAL, CKMB, CKMBINDEX, TROPONINI in the last 168 hours. BNP (last 3 results) No results for input(s): PROBNP in the last 8760 hours. HbA1C: No results for input(s): HGBA1C in the last 72 hours. CBG: No results for input(s): GLUCAP in the last 168 hours. Lipid Profile: No results for input(s): CHOL, HDL, LDLCALC, TRIG, CHOLHDL, LDLDIRECT in the last 72 hours. Thyroid Function Tests: No results for input(s): TSH, T4TOTAL, FREET4, T3FREE, THYROIDAB in the last 72 hours. Anemia Panel: No results for input(s):  VITAMINB12, FOLATE, FERRITIN, TIBC, IRON, RETICCTPCT in the last 72 hours. Urine analysis:    Component Value Date/Time   COLORURINE YELLOW (A) 02/12/2018 0920   APPEARANCEUR Clear 01/25/2020 0900   LABSPEC 1.025 02/12/2018 0920   PHURINE 5.0 02/12/2018 0920   GLUCOSEU Trace (A) 01/25/2020 0900   HGBUR NEGATIVE 02/12/2018 0920   BILIRUBINUR Negative 01/25/2020 0900  KETONESUR 5 (A) 02/12/2018 0920   PROTEINUR 3+ (A) 01/25/2020 0900   PROTEINUR 30 (A) 02/12/2018 0920   NITRITE Negative 01/25/2020 0900   NITRITE NEGATIVE 02/12/2018 0920   LEUKOCYTESUR Negative 01/25/2020 0900    Radiological Exams on Admission: DG Ankle Complete Right  Result Date: 11/10/2021 CLINICAL DATA:  Right ankle affection. EXAM: RIGHT ANKLE - COMPLETE 3+ VIEW COMPARISON:  None. FINDINGS: No fracture or dislocation is noted. Mild osteophyte formation is seen involving the talotibial joint. Soft tissue swelling is seen overlying the lateral malleolus. Vascular calcifications are noted. IMPRESSION: Soft tissue swelling is noted overlying lateral malleolus. No acute bony abnormality is noted. Electronically Signed   By: Marijo Conception M.D.   On: 11/10/2021 11:39   CT Ankle Right Wo Contrast  Result Date: 11/10/2021 CLINICAL DATA:  Ankle pain. Right ankle soft tissue swelling and redness. EXAM: CT OF THE RIGHT ANKLE WITHOUT CONTRAST TECHNIQUE: Multidetector CT imaging of the right ankle was performed according to the standard protocol. Multiplanar CT image reconstructions were also generated. COMPARISON:  Radiograph performed on the same date. FINDINGS: Bones/Joint/Cartilage Postsurgical changes with two thicker smoker screws in the calcaneus. No acute fracture or dislocation. Arthropathy of the tibiotalar and talonavicular joints with osteophytes. Partially imaged hardware in the first metatarsal. Ligaments Suboptimally assessed by CT. Muscles and Tendons Muscles are normal in bulk.  No evidence of acute tendon tear.  Soft tissues Marked subcutaneous soft tissue edema with a peripherally enhancing collection about the lateral aspect of the ankle measuring at least 5.6 x 2.9 by 3.7 cm, likely representing abscess. IMPRESSION: 1. Peripherally enhancing fluid collection in the subcutaneous soft tissues on the lateral aspect of the ankle measuring at least 5.6 x 2.9 x 3.7 cm. Generalized subcutaneous soft tissue edema about the ankle, concerning for cellulitis. 2. Postsurgical changes about the calcaneus without evidence of acute fracture. 3.  Arthropathy of the tibiotalar and talonavicular joints. Electronically Signed   By: Keane Police D.O.   On: 11/10/2021 16:44   DG Foot Complete Right  Result Date: 11/10/2021 CLINICAL DATA:  Right foot pain and swelling. EXAM: RIGHT FOOT COMPLETE - 3+ VIEW COMPARISON:  June 11, 2021. FINDINGS: There is no evidence of fracture or dislocation. Postsurgical changes are seen involving the calcaneus and first metatarsal. Soft tissues are unremarkable. IMPRESSION: No acute abnormality seen. Electronically Signed   By: Marijo Conception M.D.   On: 11/10/2021 11:37       Athena Masse MD [facility]   11/10/2021, 9:29 PM

## 2021-11-10 NOTE — ED Notes (Signed)
Pt to ED for R ankle swelling. Had surgery in [place] to ankle. R ankle is visibly red, swollen with obvious deformity compared to L ankle. Pt endorses 10/10 pain. Pain and swelling started 3 days ago.

## 2021-11-10 NOTE — ED Triage Notes (Signed)
Patient states that he had surgery on his right ankle and an infection in his ankle that was treated with Doxycycline back in October.  Patient states that on Friday he had swelling and pain and redness in his right ankle.  Patient has had chills.  Patient unsure of fevers.

## 2021-11-10 NOTE — ED Notes (Signed)
EDP at bedside  

## 2021-11-10 NOTE — Discharge Instructions (Addendum)
Please have your wife take you directly to the emergency room

## 2021-11-10 NOTE — Consult Note (Signed)
Pharmacy Antibiotic Note  Jonathan Fields is a 65 y.o. male admitted on 11/10/2021 with cellulitis.  Pharmacy has been consulted for vancomycin dosing.  Plan: Vancomycin 2250 mg IV loading dose, followed by 1250 mg IV q12h Goal AUC 400-550  Est AUC: 440.4 Est Cmax: 28.1 Est Cmin: 12.5 Calculated with SCr 0.95  Monitor clinical picture, renal function, and vancomycin levels at steady state F/U C&S, abx deescalation / LOT   Height: 6\' 2"  (188 cm) Weight: 102.1 kg (225 lb) IBW/kg (Calculated) : 82.2  Temp (24hrs), Avg:98.4 F (36.9 C), Min:98.1 F (36.7 C), Max:98.7 F (37.1 C)  Recent Labs  Lab 11/10/21 1046 11/10/21 1534 11/10/21 1535  WBC 9.6 8.3  --   CREATININE 0.95  --   --   LATICACIDVEN  --   --  2.1*    Estimated Creatinine Clearance: 96.3 mL/min (by C-G formula based on SCr of 0.95 mg/dL).    Allergies  Allergen Reactions   Penicillins Hives    Has patient had a PCN reaction causing immediate rash, facial/tongue/throat swelling, SOB or lightheadedness with hypotension: yes Has patient had a PCN reaction causing severe rash involving mucus membranes or skin necrosis: no Has patient had a PCN reaction that required hospitalization: no Has patient had a PCN reaction occurring within the last 10 years: no If all of the above answers are "NO", then may proceed with Cephalosporin use.     Antimicrobials this admission: 12/11 ceftriaxone >>  12/11 vancomycin >>   Dose adjustments this admission: N/A  Microbiology results: 12/11 BCx: pending   Thank you for allowing pharmacy to be a part of this patient's care.  Darnelle Bos, PharmD 11/10/2021 9:55 PM

## 2021-11-11 ENCOUNTER — Encounter: Payer: Self-pay | Admitting: Oncology

## 2021-11-11 ENCOUNTER — Telehealth: Payer: Self-pay | Admitting: Podiatry

## 2021-11-11 DIAGNOSIS — D751 Secondary polycythemia: Secondary | ICD-10-CM

## 2021-11-11 DIAGNOSIS — E1165 Type 2 diabetes mellitus with hyperglycemia: Secondary | ICD-10-CM

## 2021-11-11 DIAGNOSIS — I10 Essential (primary) hypertension: Secondary | ICD-10-CM

## 2021-11-11 DIAGNOSIS — M86171 Other acute osteomyelitis, right ankle and foot: Secondary | ICD-10-CM

## 2021-11-11 DIAGNOSIS — M216X1 Other acquired deformities of right foot: Secondary | ICD-10-CM

## 2021-11-11 LAB — HIV ANTIBODY (ROUTINE TESTING W REFLEX): HIV Screen 4th Generation wRfx: NONREACTIVE

## 2021-11-11 LAB — CBC
HCT: 44.5 % (ref 39.0–52.0)
Hemoglobin: 14.1 g/dL (ref 13.0–17.0)
MCH: 25.1 pg — ABNORMAL LOW (ref 26.0–34.0)
MCHC: 31.7 g/dL (ref 30.0–36.0)
MCV: 79.2 fL — ABNORMAL LOW (ref 80.0–100.0)
Platelets: 245 10*3/uL (ref 150–400)
RBC: 5.62 MIL/uL (ref 4.22–5.81)
RDW: 14.5 % (ref 11.5–15.5)
WBC: 6.6 10*3/uL (ref 4.0–10.5)
nRBC: 0 % (ref 0.0–0.2)

## 2021-11-11 LAB — CREATININE, SERUM
Creatinine, Ser: 0.8 mg/dL (ref 0.61–1.24)
GFR, Estimated: >60 mL/min (ref >60)

## 2021-11-11 LAB — RESP PANEL BY RT-PCR (FLU A&B, COVID) ARPGX2
Influenza A by PCR: NEGATIVE
Influenza B by PCR: NEGATIVE
SARS Coronavirus 2 by RT PCR: NEGATIVE

## 2021-11-11 LAB — GLUCOSE, CAPILLARY
Glucose-Capillary: 191 mg/dL — ABNORMAL HIGH (ref 70–99)
Glucose-Capillary: 164 mg/dL — ABNORMAL HIGH (ref 70–99)
Glucose-Capillary: 235 mg/dL — ABNORMAL HIGH (ref 70–99)
Glucose-Capillary: 211 mg/dL — ABNORMAL HIGH (ref 70–99)
Glucose-Capillary: 202 mg/dL — ABNORMAL HIGH (ref 70–99)

## 2021-11-11 LAB — CBG MONITORING, ED: Glucose-Capillary: 250 mg/dL — ABNORMAL HIGH (ref 70–99)

## 2021-11-11 MED ORDER — VANCOMYCIN HCL 750 MG/150ML IV SOLN
750.0000 mg | Freq: Three times a day (TID) | INTRAVENOUS | Status: DC
Start: 2021-11-11 — End: 2021-11-13
  Administered 2021-11-11 – 2021-11-13 (×6): 750 mg via INTRAVENOUS
  Filled 2021-11-11 (×7): qty 150

## 2021-11-11 MED ORDER — INSULIN GLARGINE-YFGN 100 UNIT/ML ~~LOC~~ SOLN
10.0000 [IU] | Freq: Every day | SUBCUTANEOUS | Status: DC
  Administered 2021-11-11 – 2021-11-19 (×9): 10 [IU] via SUBCUTANEOUS
  Filled 2021-11-11 (×10): qty 0.1

## 2021-11-11 MED ORDER — ATORVASTATIN CALCIUM 20 MG PO TABS
40.0000 mg | ORAL_TABLET | Freq: Every day | ORAL | Status: DC
  Administered 2021-11-11 – 2021-11-19 (×9): 40 mg via ORAL
  Filled 2021-11-11 (×9): qty 2

## 2021-11-11 MED ORDER — ASPIRIN EC 81 MG PO TBEC
81.0000 mg | DELAYED_RELEASE_TABLET | Freq: Every day | ORAL | Status: DC
  Administered 2021-11-11 – 2021-11-19 (×9): 81 mg via ORAL
  Filled 2021-11-11 (×9): qty 1

## 2021-11-11 MED ORDER — LISINOPRIL 20 MG PO TABS
40.0000 mg | ORAL_TABLET | Freq: Every day | ORAL | Status: DC
  Administered 2021-11-11 – 2021-11-19 (×9): 40 mg via ORAL
  Filled 2021-11-11 (×9): qty 2

## 2021-11-11 MED ORDER — ENOXAPARIN SODIUM 40 MG/0.4ML IJ SOSY
40.0000 mg | PREFILLED_SYRINGE | INTRAMUSCULAR | Status: DC
  Administered 2021-11-11 – 2021-11-19 (×8): 40 mg via SUBCUTANEOUS
  Filled 2021-11-11 (×8): qty 0.4

## 2021-11-11 MED ORDER — INSULIN ASPART 100 UNIT/ML IJ SOLN
0.0000 [IU] | Freq: Three times a day (TID) | INTRAMUSCULAR | Status: DC
Start: 2021-11-11 — End: 2021-11-19
  Administered 2021-11-11: 5 [IU] via SUBCUTANEOUS
  Administered 2021-11-11: 09:00:00 3 [IU] via SUBCUTANEOUS
  Administered 2021-11-11: 12:00:00 5 [IU] via SUBCUTANEOUS
  Administered 2021-11-12: 8 [IU] via SUBCUTANEOUS
  Administered 2021-11-12 (×2): 3 [IU] via SUBCUTANEOUS
  Administered 2021-11-13: 12:00:00 2 [IU] via SUBCUTANEOUS
  Administered 2021-11-13 – 2021-11-14 (×2): 3 [IU] via SUBCUTANEOUS
  Administered 2021-11-14: 5 [IU] via SUBCUTANEOUS
  Administered 2021-11-14 – 2021-11-17 (×9): 3 [IU] via SUBCUTANEOUS
  Administered 2021-11-18: 11:00:00 2 [IU] via SUBCUTANEOUS
  Administered 2021-11-18: 13:00:00 5 [IU] via SUBCUTANEOUS
  Administered 2021-11-18: 17:00:00 2 [IU] via SUBCUTANEOUS
  Administered 2021-11-19 (×2): 3 [IU] via SUBCUTANEOUS
  Filled 2021-11-11 (×25): qty 1

## 2021-11-11 NOTE — Progress Notes (Signed)
Inpatient Diabetes Program Recommendations  AACE/ADA: New Consensus Statement on Inpatient Glycemic Control (2015)  Target Ranges:  Prepandial:   less than 140 mg/dL      Peak postprandial:   less than 180 mg/dL (1-2 hours)      Critically ill patients:  140 - 180 mg/dL    Latest Reference Range & Units 11/11/21 01:39 11/11/21 03:50 11/11/21 07:45  Glucose-Capillary 70 - 99 mg/dL 250 (H)  5 units Novolog 191 (H)  3 units Novolog 164 (H)    Latest Reference Range & Units 04/01/21 16:36 11/04/21 08:26   4.8 - 5.6 % 7.8 (H) 9.5 (H)  (226 mg/dl)    Admit with: Postoperative cellulitis and abscess of surgical wound right ankle s/p right lateral ankle stabilization procedure on 35/05/176, complicated by 2 postoperative dehiscence   History: DM2  Home DM Meds: Metformin 1000 mg BID       Farxiga 5 mg Daily (Hasn't started taking yet--new Rx just filled last week)  Current Orders: Semglee 10 units daily     Novolog Moderate Correction Scale/ SSI (0-15 units) TID    PCP: Dr. Barron Alvine seen 10/31/2021 [place]   Met w/ pt at bedside to discuss most recent A1c of 9.5%.  Pt states he saw his PCP about 2 weeks ago.  Was sent a new Rx to add on Farxiga 5 mg daily--just received the new Rx and didn't have a chance to start it before being admitted.  Has CBG meter at home and checks CBGs but not on regular basis.  I discussed with pt the extreme importance of good CBG control to help heal ankle wound and prevent further wounds and infections.  Pt stated the importance of good CBG control and is willing to start the new med.  Stated to me he "eats cleanly" and tries to limit carbohydrates but recently has had issues controlling his intake of "Sweets".  Stated to me he never used to have issues with eating dessert and needs to work on this.  Only drinks water and diet sodas (gatorade on occasion when not feeling well).  Did not have any questions or concerns about  his diabetes at this time.  I discussed with pt why we are giving him insulin in the hospital and that the MD will likely resume his home oral meds at time of discharge.  Also reviewed with pt his new Rx for Farxiga at home (what it is, how it works, side effects, etc).  Pt appreciative of visit.    --Will follow patient during hospitalization--  Wyn Quaker RN, MSN, CDE Diabetes Coordinator Inpatient Glycemic Control Team Team Pager: [phone] (8a-5p)

## 2021-11-11 NOTE — Progress Notes (Addendum)
Order received from Dr Blenda Mounts for a carb/mod diet and wound culture

## 2021-11-11 NOTE — Progress Notes (Signed)
Order received from Dr Ouida Sills to change CBG from Q4 to [facility] and HS

## 2021-11-11 NOTE — Telephone Encounter (Signed)
Patient called left message on after hours that Friday night his ankle was swollen, red and hot to touch. Patient would like a call back. [phone].

## 2021-11-11 NOTE — TOC CM/SW Note (Signed)
Transition of Care [facility]) Screening Note   Patient Details  Name: Jonathan Fields Date of Birth: 01/07/1956   [facility]) CM/SW Contact:    Candie Chroman, LCSW Phone Number: 11/11/2021, 4:13 PM    Transition of Care Department [facility]) has reviewed patient and no TOC needs have been identified at this time. We will continue to monitor patient advancement through interdisciplinary progression rounds. If new patient transition needs arise, please place a TOC consult.

## 2021-11-11 NOTE — Progress Notes (Signed)
PROGRESS NOTE  Jonathan Fields    DOB: [birth year], 65 y.o.  ACZ:[id number]  PCP: Virginia Crews, MD   Code Status: Full Code   DOA: 11/10/2021   LOS: 1  Brief Narrative of Current Hospitalization  Jonathan Fields is a 64 y.o. male with a PMH significant for type II DM, HTN, BPH, OSA, cavus deformity right foot s/p stabilization 08/9322 complicated by wound dehiscence and failed outpatient antibiotic treatment. They presented from home to the ED on 11/10/2021 with right ankle pain redness, draining and swelling x several days. In the ED, it was found that they had cellulitis and abscess. They were treated with IV antibiotics.  Podiatry was consulted and performed bedside I&D for abscess.  MRI showed osteomyelitis with septic arthritis so Ortho surgery was also consulted.  Patient was admitted to medicine service for further workup and management of right ankle septic arthritis as outlined in detail below.  11/11/21 -stable  Assessment & Plan  Principal Problem:   Postoperative cellulitis and abscess of surgical wound right ankle Active Problems:   Hypertension   Cavovarus deformity of foot, acquired, right   Hyperglycemia due to type 2 diabetes mellitus ([place])  R lateral ankle abscess with osteomyelitis and septic arthritis. S/p "stabilization" surgery 08/2021. Podiatry performed bedside I&D with packing to abscess 12/12 - podiatry following, appreciate recs - ortho surgery consulted - continue IV Abx -Monitor fever curve  Erythrocytosis- therapeutic phlebotomy last performed 08/2021.  RBCs 6.0 on admission, hemoglobin 15.2, hematocrit 47.4.  In setting of testosterone replacement therapy. -Therapeutic phlebotomy -Low-dose aspirin -CBC a.m.  Type II DM-blood sugars elevated - sSSI -Semglee 10 units daily  HTN-elevated this admission.  Likely to improve with phlebotomy. -Continue home antihypertensives.  DVT prophylaxis: SCDs Start: 11/10/21 2142.  Lovenox   Diet:   Diet Orders (From admission, onward)     Start     Ordered   11/11/21 0732  Diet Carb Modified Fluid consistency: Thin; Room service appropriate? Yes  Diet effective now       Question Answer Comment  Diet-HS Snack? Nothing   Calorie Level Medium 1600-2000   Fluid consistency: Thin   Room service appropriate? Yes      11/11/21 0732            Subjective 11/11/21    Pt reports improved tenderness to right ankle after I&D.  No other complaints at this time.  Disposition Plan & Communication  Patient status: Inpatient  Admitted From: Home Disposition: Home Anticipated discharge date: 12/15  Family Communication: None Consults, Procedures, Significant Events  Consultants:  Podiatry Orthopedic surgery  Procedures/significant events:  None  Antimicrobials:  Anti-infectives (From admission, onward)    Start     Dose/Rate Route Frequency Ordered Stop   11/11/21 0900  vancomycin (VANCOREADY) IVPB 1250 mg/250 mL        1,250 mg 166.7 mL/hr over 90 Minutes Intravenous Every 12 hours 11/10/21 2201     11/10/21 1830  vancomycin (VANCOREADY) IVPB 1250 mg/250 mL       See Hyperspace for full Linked Orders Report.   1,250 mg 166.7 mL/hr over 90 Minutes Intravenous  Once 11/10/21 1715 11/10/21 2027   11/10/21 1730  vancomycin (VANCOCIN) IVPB 1000 mg/200 mL premix       See Hyperspace for full Linked Orders Report.   1,000 mg 200 mL/hr over 60 Minutes Intravenous  Once 11/10/21 1715 11/10/21 2258   11/10/21 1715  cefTRIAXone (ROCEPHIN) 2 g in sodium chloride 0.9 %  100 mL IVPB        2 g 200 mL/hr over 30 Minutes Intravenous  Once 11/10/21 1706 11/10/21 1835       Objective   Vitals:   11/11/21 0000 11/11/21 0238 11/11/21 0352 11/11/21 0747  BP: (!) 160/86 (!) 142/95 (!) 155/95 (!) 166/105  Pulse: 64 86 87 84  Resp: 13 (!) 24 20 18   Temp:  98.6 F (37 C) 98 F (36.7 C) 98.5 F (36.9 C)  TempSrc:  Oral Oral Oral  SpO2: 94% 96% 96% 97%  Weight:      Height:         Intake/Output Summary (Last 24 hours) at 11/11/2021 1103 Last data filed at 11/11/2021 1011 Gross per 24 hour  Intake 708.52 ml  Output 1150 ml  Net -441.48 ml   Filed Weights   11/10/21 1045  Weight: 102.1 kg    Patient BMI: Body mass index is 28.89 kg/m.   Physical Exam:  General: awake, alert, NAD Respiratory: normal respiratory effort. Cardiovascular: normal S1/S2, RRR, no JVD, murmurs, quick capillary refill  Nervous: A&O x3. no gross focal neurologic deficits, normal speech Extremities: moves all equally, no edema, normal tone.  Please see clinical image for further detail of right ankle abnormalities Skin: dry, intact, normal temperature, normal color. No rashes, lesions or ulcers on exposed skin other than seen on the image Psychiatry: normal mood, congruent affect    Labs   I have personally reviewed following labs and imaging studies Admission on 11/10/2021  Component Date Value Ref Range Status   WBC 11/10/2021 9.6  4.0 - 10.5 K/uL Final   RBC 11/10/2021 5.95 (H)  4.22 - 5.81 MIL/uL Final   Hemoglobin 11/10/2021 15.1  13.0 - 17.0 g/dL Final   HCT 11/10/2021 46.9  39.0 - 52.0 % Final   MCV 11/10/2021 78.8 (L)  80.0 - 100.0 fL Final   MCH 11/10/2021 25.4 (L)  26.0 - 34.0 pg Final   MCHC 11/10/2021 32.2  30.0 - 36.0 g/dL Final   RDW 11/10/2021 14.2  11.5 - 15.5 % Final   Platelets 11/10/2021 222  150 - 400 K/uL Final   nRBC 11/10/2021 0.0  0.0 - 0.2 % Final   Neutrophils Relative % 11/10/2021 70  % Final   Neutro Abs 11/10/2021 6.8  1.7 - 7.7 K/uL Final   Lymphocytes Relative 11/10/2021 15  % Final   Lymphs Abs 11/10/2021 1.4  0.7 - 4.0 K/uL Final   Monocytes Relative 11/10/2021 12  % Final   Monocytes Absolute 11/10/2021 1.2 (H)  0.1 - 1.0 K/uL Final   Eosinophils Relative 11/10/2021 1  % Final   Eosinophils Absolute 11/10/2021 0.1  0.0 - 0.5 K/uL Final   Basophils Relative 11/10/2021 1  % Final   Basophils Absolute 11/10/2021 0.1  0.0 - 0.1 K/uL Final    Immature Granulocytes 11/10/2021 1  % Final   Abs Immature Granulocytes 11/10/2021 0.07  0.00 - 0.07 K/uL Final   Sodium 11/10/2021 133 (L)  135 - 145 mmol/L Final   Potassium 11/10/2021 4.1  3.5 - 5.1 mmol/L Final   Chloride 11/10/2021 100  98 - 111 mmol/L Final   CO2 11/10/2021 24  22 - 32 mmol/L Final   Glucose, Bld 11/10/2021 275 (H)  70 - 99 mg/dL Final   BUN 11/10/2021 15  8 - 23 mg/dL Final   Creatinine, Ser 11/10/2021 0.95  0.61 - 1.24 mg/dL Final   Calcium 11/10/2021 8.7 (L)  8.9 -  10.3 mg/dL Final   Total Protein 11/10/2021 7.2  6.5 - 8.1 g/dL Final   Albumin 11/10/2021 3.7  3.5 - 5.0 g/dL Final   AST 11/10/2021 16  15 - 41 U/L Final   ALT 11/10/2021 17  0 - 44 U/L Final   Alkaline Phosphatase 11/10/2021 57  38 - 126 U/L Final   Total Bilirubin 11/10/2021 1.6 (H)  0.3 - 1.2 mg/dL Final   GFR, Estimated 11/10/2021 >60  >60 mL/min Final   Anion gap 11/10/2021 9  5 - 15 Final   WBC 11/10/2021 8.3  4.0 - 10.5 K/uL Final   RBC 11/10/2021 6.00 (H)  4.22 - 5.81 MIL/uL Final   Hemoglobin 11/10/2021 15.2  13.0 - 17.0 g/dL Final   HCT 11/10/2021 47.4  39.0 - 52.0 % Final   MCV 11/10/2021 79.0 (L)  80.0 - 100.0 fL Final   MCH 11/10/2021 25.3 (L)  26.0 - 34.0 pg Final   MCHC 11/10/2021 32.1  30.0 - 36.0 g/dL Final   RDW 11/10/2021 14.2  11.5 - 15.5 % Final   Platelets 11/10/2021 243  150 - 400 K/uL Final   nRBC 11/10/2021 0.0  0.0 - 0.2 % Final   Neutrophils Relative % 11/10/2021 72  % Final   Neutro Abs 11/10/2021 6.0  1.7 - 7.7 K/uL Final   Lymphocytes Relative 11/10/2021 14  % Final   Lymphs Abs 11/10/2021 1.1  0.7 - 4.0 K/uL Final   Monocytes Relative 11/10/2021 12  % Final   Monocytes Absolute 11/10/2021 1.0  0.1 - 1.0 K/uL Final   Eosinophils Relative 11/10/2021 1  % Final   Eosinophils Absolute 11/10/2021 0.1  0.0 - 0.5 K/uL Final   Basophils Relative 11/10/2021 0  % Final   Basophils Absolute 11/10/2021 0.0  0.0 - 0.1 K/uL Final   Immature Granulocytes 11/10/2021 1  %  Final   Abs Immature Granulocytes 11/10/2021 0.07  0.00 - 0.07 K/uL Final   Specimen Description 11/10/2021 BLOOD RIGHT ANTECUBITAL   Final   Special Requests 11/10/2021 BOTTLES DRAWN AEROBIC AND ANAEROBIC Blood Culture adequate volume   Final   Culture 11/10/2021    Final                   Value:NO GROWTH < 24 HOURS Performed at [facility], [address] [place], [place] [address]    Report Status 11/10/2021 PENDING   Incomplete   Specimen Description 11/10/2021 BLOOD BLOOD RIGHT FOREARM   Final   Special Requests 11/10/2021 BOTTLES DRAWN AEROBIC AND ANAEROBIC Blood Culture adequate volume   Final   Culture 11/10/2021    Final                   Value:NO GROWTH < 24 HOURS Performed at [facility], [address], [place], [place] [address]    Report Status 11/10/2021 PENDING   Incomplete   Lactic Acid, Venous 11/10/2021 2.1 (HH)  0.5 - 1.9 mmol/L Final   Lactic Acid, Venous 11/10/2021 1.4  0.5 - 1.9 mmol/L Final   HIV Screen 4th Generation wRfx 11/11/2021 Non Reactive  Non Reactive Final   Creatinine, Ser 11/11/2021 0.80  0.61 - 1.24 mg/dL Final   GFR, Estimated 11/11/2021 >60  >60 mL/min Final   SARS Coronavirus 2 by RT PCR 11/11/2021 NEGATIVE  NEGATIVE Final   Influenza A by PCR 11/11/2021 NEGATIVE  NEGATIVE Final   Influenza B by PCR 11/11/2021 NEGATIVE  NEGATIVE Final   Glucose-Capillary  11/11/2021 250 (H)  70 - 99 mg/dL Final   Glucose-Capillary 11/11/2021 191 (H)  70 - 99 mg/dL Final   Glucose-Capillary 11/11/2021 164 (H)  70 - 99 mg/dL Final    Imaging Studies  DG Ankle Complete Right  Result Date: 11/10/2021 CLINICAL DATA:  Right ankle affection. EXAM: RIGHT ANKLE - COMPLETE 3+ VIEW COMPARISON:  None. FINDINGS: No fracture or dislocation is noted. Mild osteophyte formation is seen involving the talotibial joint. Soft tissue swelling is seen overlying the lateral malleolus. Vascular calcifications are noted. IMPRESSION: Soft tissue swelling is noted  overlying lateral malleolus. No acute bony abnormality is noted. Electronically Signed   By: Marijo Conception M.D.   On: 11/10/2021 11:39   CT Ankle Right Wo Contrast  Result Date: 11/10/2021 CLINICAL DATA:  Ankle pain. Right ankle soft tissue swelling and redness. EXAM: CT OF THE RIGHT ANKLE WITHOUT CONTRAST TECHNIQUE: Multidetector CT imaging of the right ankle was performed according to the standard protocol. Multiplanar CT image reconstructions were also generated. COMPARISON:  Radiograph performed on the same date. FINDINGS: Bones/Joint/Cartilage Postsurgical changes with two thicker smoker screws in the calcaneus. No acute fracture or dislocation. Arthropathy of the tibiotalar and talonavicular joints with osteophytes. Partially imaged hardware in the first metatarsal. Ligaments Suboptimally assessed by CT. Muscles and Tendons Muscles are normal in bulk.  No evidence of acute tendon tear. Soft tissues Marked subcutaneous soft tissue edema with a peripherally enhancing collection about the lateral aspect of the ankle measuring at least 5.6 x 2.9 by 3.7 cm, likely representing abscess. IMPRESSION: 1. Peripherally enhancing fluid collection in the subcutaneous soft tissues on the lateral aspect of the ankle measuring at least 5.6 x 2.9 x 3.7 cm. Generalized subcutaneous soft tissue edema about the ankle, concerning for cellulitis. 2. Postsurgical changes about the calcaneus without evidence of acute fracture. 3.  Arthropathy of the tibiotalar and talonavicular joints. Electronically Signed   By: Keane Police D.O.   On: 11/10/2021 16:44   MR ANKLE RIGHT W WO CONTRAST  Result Date: 11/11/2021 CLINICAL DATA:  Multiple right ankle surgeries. Pain and swelling around the right ankle. EXAM: MRI OF THE RIGHT ANKLE WITHOUT AND WITH CONTRAST TECHNIQUE: Multiplanar, multisequence MR imaging of the ankle was performed before and after the administration of intravenous contrast. CONTRAST:  55mL GADAVIST GADOBUTROL 1  MMOL/ML IV SOLN COMPARISON:  CT right ankle 11/10/2021 FINDINGS: Susceptibility artifact resulting from the orthopedic hardware in the calcaneus partially obscures adjacent soft tissue and osseous structures. TENDONS Peroneal: Peroneal longus tendon intact. Peroneal brevis intact. Posteromedial: Posterior tibial tendon intact. Flexor hallucis longus tendon intact. Flexor digitorum longus tendon intact. Moderate amount of fluid in the flexor hallucis tendon sheath at the level of the tibiotalar joint. Anterior: Tibialis anterior tendon intact. Extensor hallucis longus tendon intact Extensor digitorum longus tendon intact. Achilles:  Intact. Plantar Fascia: Intact. LIGAMENTS Lateral: Chronic tear of the anterior talofibular ligament and calcaneofibular ligament. Prior injury of the posterior talofibular ligament without a complete tear. Anterior and posterior tibiofibular ligaments intact. Postsurgical changes from prior anterior talofibular ligament repair which appears torn. Medial: Not well visualized secondary to susceptibility artifact. CARTILAGE Ankle Joint: Moderate ankle joint effusion. Peripherally enhancing 3.6 x 2.1 x 4 cm fluid collection in the soft tissues along the anterolateral aspect of the ankle joint abutting the lateral malleolus and contiguous with the tibiotalar joint space most concerning for an abscess and septic arthritis. Partial-thickness cartilage loss of the tibiotalar joint. Subtalar Joints/Sinus Tarsi: Normal subtalar joints.  No subtalar joint effusion. Normal sinus tarsi. Bones: No acute fracture or dislocation. Complex fluid collection abuts the lateral malleolus with subcortical bone marrow edema concerning for osteomyelitis. Arthrodesis of the first TMT joint. Healed calcaneal osteotomy with orthopedic hardware in place. Mild osteoarthritis of the talonavicular joint and calcaneocuboid joint. Soft Tissue: Generalized soft tissue edema around the ankle and dorsal foot concerning for  cellulitis. Muscles are normal without edema or atrophy. Tarsal tunnel is normal. IMPRESSION: IMPRESSION 1. Moderate ankle joint effusion. Peripherally enhancing 3.6 x 2.1 x 4 cm fluid collection in the soft tissues along the anterolateral aspect of the ankle joint abutting the lateral malleolus and contiguous with the tibiotalar joint space most concerning for an abscess and septic arthritis. Complex fluid collection abuts the lateral malleolus with subcortical bone marrow edema concerning for osteomyelitis. 2. Generalized soft tissue edema around the ankle and dorsal foot concerning for cellulitis. Electronically Signed   By: Kathreen Devoid M.D.   On: 11/11/2021 07:46   DG Foot Complete Right  Result Date: 11/10/2021 CLINICAL DATA:  Right foot pain and swelling. EXAM: RIGHT FOOT COMPLETE - 3+ VIEW COMPARISON:  June 11, 2021. FINDINGS: There is no evidence of fracture or dislocation. Postsurgical changes are seen involving the calcaneus and first metatarsal. Soft tissues are unremarkable. IMPRESSION: No acute abnormality seen. Electronically Signed   By: Marijo Conception M.D.   On: 11/10/2021 11:37    Medications   Scheduled Meds:  aspirin EC  81 mg Oral Daily   enoxaparin (LOVENOX) injection  40 mg Subcutaneous Q24H   insulin aspart  0-15 Units Subcutaneous TID WC   insulin glargine-yfgn  10 Units Subcutaneous Daily   No recently discontinued medications to reconcile  LOS: 1 day   Time spent: >58min  Liban Guedes L Vedant Shehadeh, DO [facility] 11/11/2021, 11:03 AM   Available by Epic secure chat 7AM-7PM. If 7PM-7AM, please contact night-coverage Refer to [email or web address] to contact the [facility] Attending or Consulting provider for this pt

## 2021-11-11 NOTE — Consult Note (Signed)
Subjective:  Patient ID: Jonathan Fields, male    DOB: 10-07-1956,  MRN: [id number]  Patient with past medical history  of DM, HTN, BPH, OSA, cavus deformity right foot, s/p right lateral ankle stabilization procedure on 09/05/2021 with Dr. Amalia Hailey who was dealing with wound dehiscence that has healed up about a week ago seen at beside today for right foot pain and swelling that started about 5 days ago. He was seen in urgent care and then told to report to the ED for concern of abscess. Relates significant pain in the ankle but denies n/v/f/c.Marland Kitchen   Past Medical History:  Diagnosis Date   BPH (benign prostatic hyperplasia)    Bronchitis    Complication of anesthesia    hard to wake up   DM (diabetes mellitus), type 2 ([place])    DVT (deep venous thrombosis) ([place])    post op   ED (erectile dysfunction)    Erythrocytosis 07/04/2020   GERD (gastroesophageal reflux disease)    Hyperlipemia    Hypertension    Hypogonadism in male    Nocturia    Right ankle instability    Sleep apnea    TB lung, latent    Urinary frequency      Past Surgical History:  Procedure Laterality Date   ANKLE SURGERY Left    torn ligament   CARPAL TUNNEL RELEASE Right 08/2019   COLONOSCOPY  2009   KNEE ARTHROSCOPY Left    right ankle ruptured tendon surgery  2008   TRANSURETHRAL RESECTION OF PROSTATE N/A 02/19/2018   Procedure: TRANSURETHRAL RESECTION OF THE PROSTATE (TURP);  Surgeon: [name], MD;  Location: [facility] ORS;  Service: Urology;  Laterality: N/A;    CBC Latest Ref Rng & Units 11/10/2021 11/10/2021 10/18/2021  WBC 4.0 - 10.5 K/uL 8.3 9.6 6.2  Hemoglobin 13.0 - 17.0 g/dL 15.2 15.1 15.0  Hematocrit 39.0 - 52.0 % 47.4 46.9 45.6  Platelets 150 - 400 K/uL 243 222 213    BMP Latest Ref Rng & Units 11/11/2021 11/10/2021 09/19/2021  Glucose 70 - 99 mg/dL - 275(H) 263(H)  BUN 8 - 23 mg/dL - 15 19  Creatinine 0.61 - 1.24 mg/dL 0.80 0.95 0.87  BUN/Creat Ratio 10 - 24 - - -  Sodium 135 - 145  mmol/L - 133(L) 135  Potassium 3.5 - 5.1 mmol/L - 4.1 4.1  Chloride 98 - 111 mmol/L - 100 101  CO2 22 - 32 mmol/L - 24 26  Calcium 8.9 - 10.3 mg/dL - 8.7(L) 8.8(L)     Objective:   Vitals:   11/11/21 0352 11/11/21 0747  BP: (!) 155/95 (!) 166/105  Pulse: 87 84  Resp: 20 18  Temp: 98 F (36.7 C) 98.5 F (36.9 C)  SpO2: 96% 97%    General:AA&O x 3. Normal mood and affect   Vascular: DP and PT pulses 2/4 bilateral. Brisk capillary refill to all digits. Pedal hair present   Neruological. Epicritic sensation grossly intact.   Derm: Right lateral ankle with erythema and swelling and golf ball size fluctuant mass noted with small area of hyperkeratosis noted. Post Inicison and draingae 0.5 x 0.4 x 0.5 cm wound with granular base noted to lateral ankle. Erythema and edema improved.  Interspaces clears of maceration. Nails well groomed and normal in appearance     MSK: MMT 5/5 in dorsiflexion, plantar flexion, inversion and eversion. Normal joint ROM pain with ankle joint ROM improved after incision and drainage.    Procedure: Incision and drainage  of lateral right ankle abscess.  Rationale: Removal of non-viable soft tissue and purulence from the wound to promote healing.  Anesthesia: none Pre-Debridement Wound Measurements: Overlying hyperkeratosis Post-Debridement Wound Measurements: 0.5 cm x 0.4 cm x 0.5 cm  Type of Debridement: Sharp Excisional Tissue Removed: Non-viable soft tissue about 10 cc of purulence.  Depth of Debridement: subcutaneous tissue. Technique: Sharp excisional debridement to bleeding, viable wound base. Cultures obtained of purulence material.  Area was irrigated with 100 ml of sterile normal saline.  Dressing:Iodoform packing,  Dry, sterile, compression dressing. Disposition: Patient tolerated procedure well.    Assessment & Plan:  Patient was evaluated and treated and all questions answered.  DX: Right ankle abscess, wound dehiscence s/p right  ankle stabilization.  Wound care: iodofrom packing, DSD changed daily.   Antibiotics: IV vancomycin and rocephin DME: short CAM boot.   Discussed with patient diagnosis and treatment options. Incision and drainage preformed at bedside today. Procedure note above. Cultures obtained and sent to lab. Will follow.  Imaging reviewed. MRI still pending.  Discussed treatment plan for patient. At this time no surgical intervention planned.  Will follow cultures.   Patient in agreement with plan and all questions answered.   Lorenda Peck, MD  Accessible via secure chat for questions or concerns.

## 2021-11-11 NOTE — Consult Note (Signed)
Pharmacy Antibiotic Note  Jonathan Fields is a 65 y.o. male w/ PMH DM, HTN, BPH admitted on 11/10/2021 with cavus deformity right foot s/p stabilization 44/9753 complicated by wound dehiscence and failed outpatient antibiotic treatment. Pharmacy has been consulted for vancomycin dosing.  Plan: adjust vancomycin dose to 750 mg IV every  8  hours Goal AUC 400-550  Est AUC: 449.6 Ke: 0.091 h-1, T1/2: 7.6 h Daily renal function assessment while on IV vancomycin Levels as clinically indicated   Height: 6\' 2"  (188 cm) Weight: 102.1 kg (225 lb) IBW/kg (Calculated) : 82.2  Temp (24hrs), Avg:98.4 F (36.9 C), Min:98 F (36.7 C), Max:98.7 F (37.1 C)  Recent Labs  Lab 11/10/21 1046 11/10/21 1534 11/10/21 1535 11/10/21 2124 11/11/21 0427  WBC 9.6 8.3  --   --   --   CREATININE 0.95  --   --   --  0.80  LATICACIDVEN  --   --  2.1* 1.4  --      Estimated Creatinine Clearance: 114.3 mL/min (by C-G formula based on SCr of 0.8 mg/dL).    Allergies  Allergen Reactions   Penicillins Hives    Has patient had a PCN reaction causing immediate rash, facial/tongue/throat swelling, SOB or lightheadedness with hypotension: yes Has patient had a PCN reaction causing severe rash involving mucus membranes or skin necrosis: no Has patient had a PCN reaction that required hospitalization: no Has patient had a PCN reaction occurring within the last 10 years: no If all of the above answers are "NO", then may proceed with Cephalosporin use.     Antimicrobials this admission: 12/11 ceftriaxone >>  12/11 vancomycin >>   Microbiology results: 12/11 BCx: NGTD 12/12 WCx: pending 12/11 SARS CoV-2: negative 12/11 influenza A/B: negative  Thank you for allowing pharmacy to be a part of this patient's care.  Dallie Piles, PharmD 11/11/2021 7:47 AM

## 2021-11-11 NOTE — ED Notes (Signed)
Patient transported to [facility] bed 1 via stretcher by EDT due to admission status. Stable at transfer

## 2021-11-12 ENCOUNTER — Ambulatory Visit: Payer: BC Managed Care – PPO | Admitting: Podiatry

## 2021-11-12 LAB — CBC
HCT: 41.4 % (ref 39.0–52.0)
Hemoglobin: 13.3 g/dL (ref 13.0–17.0)
MCH: 24.9 pg — ABNORMAL LOW (ref 26.0–34.0)
MCHC: 32.1 g/dL (ref 30.0–36.0)
MCV: 77.5 fL — ABNORMAL LOW (ref 80.0–100.0)
Platelets: 229 10*3/uL (ref 150–400)
RBC: 5.34 MIL/uL (ref 4.22–5.81)
RDW: 14.2 % (ref 11.5–15.5)
WBC: 7.7 10*3/uL (ref 4.0–10.5)
nRBC: 0 % (ref 0.0–0.2)

## 2021-11-12 LAB — GLUCOSE, CAPILLARY
Glucose-Capillary: 157 mg/dL — ABNORMAL HIGH (ref 70–99)
Glucose-Capillary: 172 mg/dL — ABNORMAL HIGH (ref 70–99)
Glucose-Capillary: 271 mg/dL — ABNORMAL HIGH (ref 70–99)
Glucose-Capillary: 238 mg/dL — ABNORMAL HIGH (ref 70–99)

## 2021-11-12 LAB — CREATININE, SERUM
Creatinine, Ser: 0.74 mg/dL (ref 0.61–1.24)
GFR, Estimated: >60 mL/min (ref >60)

## 2021-11-12 NOTE — Progress Notes (Signed)
Order received from Dr Amalia Hailey for NPO after midnight. Planning for I and D in the OR

## 2021-11-12 NOTE — Progress Notes (Signed)
PODIATRY PROGRESS NOTE  NAME Jonathan Fields MRN [id number] DOB 12-21-1955 DOA 11/10/2021   Reason for consult:  Chief Complaint  Patient presents with   Foot Pain    History of present illness: 66 y.o. male seen at bedside today for follow-up evaluation regarding an abscess/postsurgical infection of the right ankle.  Patient has h/o right lateral ankle stabilization procedure 09/05/2021.  He did develop a small area of dehiscence along the lateral ankle incision site about 1 cm in length.  He was last seen in the office on 11/05/2021 at which time the incision had healed for the most part and there was no clinical evidence of infection.  He was doing very well and recommended that he may continue full activity with no restrictions.  Patient states that over the weekend he developed pain with stiffness to the right ankle as well as swelling.  He says the pain increased and he eventually went to the hospital and was admitted.  Bedside I&D performed yesterday, 11/11/2021.  At that time MRI and CT of the ankle was pending.  Cultures pending.  Currently on vancomycin 750 mg every 8 hours.  He presents for follow-up treatment evaluation  Past Medical History:  Diagnosis Date   BPH (benign prostatic hyperplasia)    Bronchitis    Complication of anesthesia    hard to wake up   DM (diabetes mellitus), type 2 (HCC)    DVT (deep venous thrombosis) ([place])    post op   ED (erectile dysfunction)    Erythrocytosis 07/04/2020   GERD (gastroesophageal reflux disease)    Hyperlipemia    Hypertension    Hypogonadism in male    Nocturia    Right ankle instability    Sleep apnea    TB lung, latent    Urinary frequency     CBC Latest Ref Rng & Units 11/12/2021 11/11/2021 11/10/2021  WBC 4.0 - 10.5 K/uL 7.7 6.6 8.3  Hemoglobin 13.0 - 17.0 g/dL 13.3 14.1 15.2  Hematocrit 39.0 - 52.0 % 41.4 44.5 47.4  Platelets 150 - 400 K/uL 229 245 243    BMP Latest Ref Rng & Units 11/12/2021 11/11/2021  11/10/2021  Glucose 70 - 99 mg/dL - - 275(H)  BUN 8 - 23 mg/dL - - 15  Creatinine 0.61 - 1.24 mg/dL 0.74 0.80 0.95  BUN/Creat Ratio 10 - 24 - - -  Sodium 135 - 145 mmol/L - - 133(L)  Potassium 3.5 - 5.1 mmol/L - - 4.1  Chloride 98 - 111 mmol/L - - 100  CO2 22 - 32 mmol/L - - 24  Calcium 8.9 - 10.3 mg/dL - - 8.7(L)      Physical Exam: General: The patient is alert and oriented x3 in no acute distress.   After removing the dressing and packing today there was a heavy amount of purulent drainage that came from the abscess site.  No significant malodor noted.  The drainage was seropurulent.  About 10 mL of drainage was expressed from the wound to the lateral aspect of the right ankle. There is also moderate erythema and edema around the ankle joint  CT ANKLE RT WO CONTRAST 11/10/2021: IMPRESSION: 1. Peripherally enhancing fluid collection in the subcutaneous soft tissues on the lateral aspect of the ankle measuring at least 5.6 x 2.9 x 3.7 cm. Generalized subcutaneous soft tissue edema about the ankle, concerning for cellulitis. 2. Postsurgical changes about the calcaneus without evidence of acute fracture. 3.  Arthropathy of the tibiotalar and  talonavicular joints.  MR ANKLE RT W WO CONTRAST 11/10/2021: IMPRESSION 1. Moderate ankle joint effusion. Peripherally enhancing 3.6 x 2.1 x 4 cm fluid collection in the soft tissues along the anterolateral aspect of the ankle joint abutting the lateral malleolus and contiguous with the tibiotalar joint space most concerning for an abscess and septic arthritis. Complex fluid collection abuts the lateral malleolus with subcortical bone marrow edema concerning for osteomyelitis. 2. Generalized soft tissue edema around the ankle and dorsal foot concerning for cellulitis.    ASSESSMENT/PLAN OF CARE 1.  Abscess/septic arthritis/cellulitis right ankle -Given the heavy amount of purulence coming from the area and MRI findings demonstrating  moderate ankle joint effusion with concern for septic joint, recommend bringing the patient to the OR for incision and drainage and irrigation and debridement.  Explained this in detail to the patient and he is in agreement.  Surgery tentatively scheduled for tomorrow pending OR and surgeon availability. -Preoperative orders placed.  N.p.o. after midnight -Continue IV vancomycin 750 mg every 8 hours.  Cultures taken 11/11/2021 are pending however currently there are no organisms seen.  Recommend broad-spectrum coverage -Also spoke with the patient's spouse, Jonathan Fields, on the phone to explain the patient's situation and the plan for or irrigation and debridement tomorrow. -Podiatry will continue to follow     Thank you for the consult.  Please contact me directly with any questions or concerns.  Cell [phone]   Edrick Kins, DPM [facility]  Dr. Edrick Kins, DPM    [address], [place] [address]                Office [phone]  Fax [phone]

## 2021-11-12 NOTE — Progress Notes (Signed)
PROGRESS NOTE  Jonathan Fields    DOB: [birth year], 65 y.o.  TUU:[id number]  PCP: Virginia Crews, MD   Code Status: Full Code   DOA: 11/10/2021   LOS: 2  Brief Narrative of Current Hospitalization  Jonathan Fields is a 65 y.o. male with a PMH significant for type II DM, HTN, BPH, OSA, cavus deformity right foot s/p stabilization 79/1505 complicated by wound dehiscence and failed outpatient antibiotic treatment. They presented from home to the ED on 11/10/2021 with right ankle pain redness, draining and swelling x several days. In the ED, it was found that they had cellulitis and abscess. They were treated with IV antibiotics.  Podiatry was consulted and performed bedside I&D for abscess.  MRI showed osteomyelitis with septic arthritis so Ortho surgery was also consulted.  Patient was admitted to medicine service for further workup and management of right ankle septic arthritis as outlined in detail below.  11/12/21 -stable  Assessment & Plan  Principal Problem:   Postoperative cellulitis and abscess of surgical wound right ankle Active Problems:   Hypertension   Cavovarus deformity of foot, acquired, right   Hyperglycemia due to type 2 diabetes mellitus ([place])  R lateral ankle abscess with osteomyelitis and septic arthritis. S/p "stabilization" surgery 08/2021. Podiatry performed bedside I&D with packing to abscess 12/12 - podiatry following, appreciate recs - continue IV Abx -Monitor fever curve  Erythrocytosis- therapeutic phlebotomy last performed 08/2021.  RBCs 6.0 on admission, hemoglobin 15.2, hematocrit 47.4.  In setting of testosterone replacement therapy. Resolved to normal s/p phlebotomy 12/12. -Therapeutic phlebotom-Low-dose aspirin -CBC a.m.  Type II DM-blood sugars elevated - sSSI -Semglee 10 units daily  HTN-elevated this admission.  Likely to improve with phlebotomy. -Continue home antihypertensives.  DVT prophylaxis: enoxaparin (LOVENOX) injection 40 mg  Start: 11/11/21 1200 SCDs Start: 11/10/21 2142.   Diet:  Diet Orders (From admission, onward)     Start     Ordered   11/11/21 0732  Diet Carb Modified Fluid consistency: Thin; Room service appropriate? Yes  Diet effective now       Question Answer Comment  Diet-HS Snack? Nothing   Calorie Level Medium 1600-2000   Fluid consistency: Thin   Room service appropriate? Yes      11/11/21 0732            Subjective 11/12/21    Pt reports no complaints today.   Disposition Plan & Communication  Patient status: Inpatient  Admitted From: Home Disposition: Home Anticipated discharge date: 12/15  Family Communication: None Consults, Procedures, Significant Events  Consultants:  Podiatry  Procedures/significant events:  I&D  Antimicrobials:  Anti-infectives (From admission, onward)    Start     Dose/Rate Route Frequency Ordered Stop   11/11/21 2200  vancomycin (VANCOREADY) IVPB 750 mg/150 mL        750 mg 150 mL/hr over 60 Minutes Intravenous Every 8 hours 11/11/21 1228     11/11/21 0900  vancomycin (VANCOREADY) IVPB 1250 mg/250 mL  Status:  Discontinued        1,250 mg 166.7 mL/hr over 90 Minutes Intravenous Every 12 hours 11/10/21 2201 11/11/21 1228   11/10/21 1830  vancomycin (VANCOREADY) IVPB 1250 mg/250 mL       See Hyperspace for full Linked Orders Report.   1,250 mg 166.7 mL/hr over 90 Minutes Intravenous  Once 11/10/21 1715 11/10/21 2027   11/10/21 1730  vancomycin (VANCOCIN) IVPB 1000 mg/200 mL premix       See Hyperspace for full Linked  Orders Report.   1,000 mg 200 mL/hr over 60 Minutes Intravenous  Once 11/10/21 1715 11/10/21 2258   11/10/21 1715  cefTRIAXone (ROCEPHIN) 2 g in sodium chloride 0.9 % 100 mL IVPB        2 g 200 mL/hr over 30 Minutes Intravenous  Once 11/10/21 1706 11/10/21 1835       Objective   Vitals:   11/11/21 0747 11/11/21 1512 11/11/21 2110 11/12/21 0605  BP: (!) 166/105 (!) 147/95 132/86 (!) 135/94  Pulse: 84 91 90 88  Resp: 18  18 (!) 21 20  Temp: 98.5 F (36.9 C) 99.1 F (37.3 C) 99.2 F (37.3 C) 97.9 F (36.6 C)  TempSrc: Oral Oral Oral Oral  SpO2: 97% 97% 95% 96%  Weight:      Height:        Intake/Output Summary (Last 24 hours) at 11/12/2021 0751 Last data filed at 11/12/2021 0313 Gross per 24 hour  Intake 880.09 ml  Output 300 ml  Net 580.09 ml    Filed Weights   11/10/21 1045  Weight: 102.1 kg    Patient BMI: Body mass index is 28.89 kg/m.   Physical Exam:  General: awake, alert, NAD Respiratory: normal respiratory effort. Cardiovascular: normal S1/S2, RRR, no JVD, murmurs, quick capillary refill  Nervous: A&O x3. no gross focal neurologic deficits, normal speech Extremities: moves all equally, no edema, normal tone.  Please see clinical image for further detail of right ankle abnormalities Skin: dry, intact, normal temperature, normal color. No rashes, lesions or ulcers on exposed skin other than seen on the image Psychiatry: normal mood, congruent affect    Labs   I have personally reviewed following labs and imaging studies Admission on 11/10/2021  Component Date Value Ref Range Status   WBC 11/10/2021 9.6  4.0 - 10.5 K/uL Final   RBC 11/10/2021 5.95 (H)  4.22 - 5.81 MIL/uL Final   Hemoglobin 11/10/2021 15.1  13.0 - 17.0 g/dL Final   HCT 11/10/2021 46.9  39.0 - 52.0 % Final   MCV 11/10/2021 78.8 (L)  80.0 - 100.0 fL Final   MCH 11/10/2021 25.4 (L)  26.0 - 34.0 pg Final   MCHC 11/10/2021 32.2  30.0 - 36.0 g/dL Final   RDW 11/10/2021 14.2  11.5 - 15.5 % Final   Platelets 11/10/2021 222  150 - 400 K/uL Final   nRBC 11/10/2021 0.0  0.0 - 0.2 % Final   Neutrophils Relative % 11/10/2021 70  % Final   Neutro Abs 11/10/2021 6.8  1.7 - 7.7 K/uL Final   Lymphocytes Relative 11/10/2021 15  % Final   Lymphs Abs 11/10/2021 1.4  0.7 - 4.0 K/uL Final   Monocytes Relative 11/10/2021 12  % Final   Monocytes Absolute 11/10/2021 1.2 (H)  0.1 - 1.0 K/uL Final   Eosinophils Relative 11/10/2021 1   % Final   Eosinophils Absolute 11/10/2021 0.1  0.0 - 0.5 K/uL Final   Basophils Relative 11/10/2021 1  % Final   Basophils Absolute 11/10/2021 0.1  0.0 - 0.1 K/uL Final   Immature Granulocytes 11/10/2021 1  % Final   Abs Immature Granulocytes 11/10/2021 0.07  0.00 - 0.07 K/uL Final   Sodium 11/10/2021 133 (L)  135 - 145 mmol/L Final   Potassium 11/10/2021 4.1  3.5 - 5.1 mmol/L Final   Chloride 11/10/2021 100  98 - 111 mmol/L Final   CO2 11/10/2021 24  22 - 32 mmol/L Final   Glucose, Bld 11/10/2021 275 (H)  70 -  99 mg/dL Final   BUN 11/10/2021 15  8 - 23 mg/dL Final   Creatinine, Ser 11/10/2021 0.95  0.61 - 1.24 mg/dL Final   Calcium 11/10/2021 8.7 (L)  8.9 - 10.3 mg/dL Final   Total Protein 11/10/2021 7.2  6.5 - 8.1 g/dL Final   Albumin 11/10/2021 3.7  3.5 - 5.0 g/dL Final   AST 11/10/2021 16  15 - 41 U/L Final   ALT 11/10/2021 17  0 - 44 U/L Final   Alkaline Phosphatase 11/10/2021 57  38 - 126 U/L Final   Total Bilirubin 11/10/2021 1.6 (H)  0.3 - 1.2 mg/dL Final   GFR, Estimated 11/10/2021 >60  >60 mL/min Final   Anion gap 11/10/2021 9  5 - 15 Final   WBC 11/10/2021 8.3  4.0 - 10.5 K/uL Final   RBC 11/10/2021 6.00 (H)  4.22 - 5.81 MIL/uL Final   Hemoglobin 11/10/2021 15.2  13.0 - 17.0 g/dL Final   HCT 11/10/2021 47.4  39.0 - 52.0 % Final   MCV 11/10/2021 79.0 (L)  80.0 - 100.0 fL Final   MCH 11/10/2021 25.3 (L)  26.0 - 34.0 pg Final   MCHC 11/10/2021 32.1  30.0 - 36.0 g/dL Final   RDW 11/10/2021 14.2  11.5 - 15.5 % Final   Platelets 11/10/2021 243  150 - 400 K/uL Final   nRBC 11/10/2021 0.0  0.0 - 0.2 % Final   Neutrophils Relative % 11/10/2021 72  % Final   Neutro Abs 11/10/2021 6.0  1.7 - 7.7 K/uL Final   Lymphocytes Relative 11/10/2021 14  % Final   Lymphs Abs 11/10/2021 1.1  0.7 - 4.0 K/uL Final   Monocytes Relative 11/10/2021 12  % Final   Monocytes Absolute 11/10/2021 1.0  0.1 - 1.0 K/uL Final   Eosinophils Relative 11/10/2021 1  % Final   Eosinophils Absolute  11/10/2021 0.1  0.0 - 0.5 K/uL Final   Basophils Relative 11/10/2021 0  % Final   Basophils Absolute 11/10/2021 0.0  0.0 - 0.1 K/uL Final   Immature Granulocytes 11/10/2021 1  % Final   Abs Immature Granulocytes 11/10/2021 0.07  0.00 - 0.07 K/uL Final   Specimen Description 11/10/2021 BLOOD RIGHT ANTECUBITAL   Final   Special Requests 11/10/2021 BOTTLES DRAWN AEROBIC AND ANAEROBIC Blood Culture adequate volume   Final   Culture 11/10/2021    Final                   Value:NO GROWTH 2 DAYS Performed at [facility], [address] [place], [place] [address]    Report Status 11/10/2021 PENDING   Incomplete   Specimen Description 11/10/2021 BLOOD BLOOD RIGHT FOREARM   Final   Special Requests 11/10/2021 BOTTLES DRAWN AEROBIC AND ANAEROBIC Blood Culture adequate volume   Final   Culture 11/10/2021    Final                   Value:NO GROWTH 2 DAYS Performed at [facility], [address] [place], [place] [address]    Report Status 11/10/2021 PENDING   Incomplete   Lactic Acid, Venous 11/10/2021 2.1 (HH)  0.5 - 1.9 mmol/L Final   Lactic Acid, Venous 11/10/2021 1.4  0.5 - 1.9 mmol/L Final   HIV Screen 4th Generation wRfx 11/11/2021 Non Reactive  Non Reactive Final   Creatinine, Ser 11/11/2021 0.80  0.61 - 1.24 mg/dL Final   GFR, Estimated 11/11/2021 >60  >60 mL/min Final   SARS Coronavirus 2  by RT PCR 11/11/2021 NEGATIVE  NEGATIVE Final   Influenza A by PCR 11/11/2021 NEGATIVE  NEGATIVE Final   Influenza B by PCR 11/11/2021 NEGATIVE  NEGATIVE Final   Glucose-Capillary 11/11/2021 250 (H)  70 - 99 mg/dL Final   Glucose-Capillary 11/11/2021 191 (H)  70 - 99 mg/dL Final   Specimen Description 11/11/2021    Final                   Value:ABSCESS Performed at [facility], [address] [place], [place] [address]    Special Requests 11/11/2021    Final                   Value:NONE Performed at [facility], [address] [place], [place] [address]     Gram Stain 11/11/2021    Final                   Value:FEW WBC PRESENT,BOTH PMN AND MONONUCLEAR NO ORGANISMS SEEN Performed at [facility], [place] [address], [place], [place] [address]    Culture 11/11/2021 PENDING   Incomplete   Report Status 11/11/2021 PENDING   Incomplete   Glucose-Capillary 11/11/2021 164 (H)  70 - 99 mg/dL Final   WBC 11/11/2021 6.6  4.0 - 10.5 K/uL Final   RBC 11/11/2021 5.62  4.22 - 5.81 MIL/uL Final   Hemoglobin 11/11/2021 14.1  13.0 - 17.0 g/dL Final   HCT 11/11/2021 44.5  39.0 - 52.0 % Final   MCV 11/11/2021 79.2 (L)  80.0 - 100.0 fL Final   MCH 11/11/2021 25.1 (L)  26.0 - 34.0 pg Final   MCHC 11/11/2021 31.7  30.0 - 36.0 g/dL Final   RDW 11/11/2021 14.5  11.5 - 15.5 % Final   Platelets 11/11/2021 245  150 - 400 K/uL Final   nRBC 11/11/2021 0.0  0.0 - 0.2 % Final   Glucose-Capillary 11/11/2021 235 (H)  70 - 99 mg/dL Final   Glucose-Capillary 11/11/2021 211 (H)  70 - 99 mg/dL Final   WBC 11/12/2021 7.7  4.0 - 10.5 K/uL Final   RBC 11/12/2021 5.34  4.22 - 5.81 MIL/uL Final   Hemoglobin 11/12/2021 13.3  13.0 - 17.0 g/dL Final   HCT 11/12/2021 41.4  39.0 - 52.0 % Final   MCV 11/12/2021 77.5 (L)  80.0 - 100.0 fL Final   MCH 11/12/2021 24.9 (L)  26.0 - 34.0 pg Final   MCHC 11/12/2021 32.1  30.0 - 36.0 g/dL Final   RDW 11/12/2021 14.2  11.5 - 15.5 % Final   Platelets 11/12/2021 229  150 - 400 K/uL Final   nRBC 11/12/2021 0.0  0.0 - 0.2 % Final   Creatinine, Ser 11/12/2021 0.74  0.61 - 1.24 mg/dL Final   GFR, Estimated 11/12/2021 >60  >60 mL/min Final   Glucose-Capillary 11/11/2021 202 (H)  70 - 99 mg/dL Final    Imaging Studies  DG Ankle Complete Right  Result Date: 11/10/2021 CLINICAL DATA:  Right ankle affection. EXAM: RIGHT ANKLE - COMPLETE 3+ VIEW COMPARISON:  None. FINDINGS: No fracture or dislocation is noted. Mild osteophyte formation is seen involving the talotibial joint. Soft tissue swelling is seen overlying the lateral malleolus. Vascular  calcifications are noted. IMPRESSION: Soft tissue swelling is noted overlying lateral malleolus. No acute bony abnormality is noted. Electronically Signed   By: Marijo Conception M.D.   On: 11/10/2021 11:39   CT Ankle Right Wo Contrast  Result Date: 11/10/2021 CLINICAL  DATA:  Ankle pain. Right ankle soft tissue swelling and redness. EXAM: CT OF THE RIGHT ANKLE WITHOUT CONTRAST TECHNIQUE: Multidetector CT imaging of the right ankle was performed according to the standard protocol. Multiplanar CT image reconstructions were also generated. COMPARISON:  Radiograph performed on the same date. FINDINGS: Bones/Joint/Cartilage Postsurgical changes with two thicker smoker screws in the calcaneus. No acute fracture or dislocation. Arthropathy of the tibiotalar and talonavicular joints with osteophytes. Partially imaged hardware in the first metatarsal. Ligaments Suboptimally assessed by CT. Muscles and Tendons Muscles are normal in bulk.  No evidence of acute tendon tear. Soft tissues Marked subcutaneous soft tissue edema with a peripherally enhancing collection about the lateral aspect of the ankle measuring at least 5.6 x 2.9 by 3.7 cm, likely representing abscess. IMPRESSION: 1. Peripherally enhancing fluid collection in the subcutaneous soft tissues on the lateral aspect of the ankle measuring at least 5.6 x 2.9 x 3.7 cm. Generalized subcutaneous soft tissue edema about the ankle, concerning for cellulitis. 2. Postsurgical changes about the calcaneus without evidence of acute fracture. 3.  Arthropathy of the tibiotalar and talonavicular joints. Electronically Signed   By: Keane Police D.O.   On: 11/10/2021 16:44   MR ANKLE RIGHT W WO CONTRAST  Result Date: 11/11/2021 CLINICAL DATA:  Multiple right ankle surgeries. Pain and swelling around the right ankle. EXAM: MRI OF THE RIGHT ANKLE WITHOUT AND WITH CONTRAST TECHNIQUE: Multiplanar, multisequence MR imaging of the ankle was performed before and after the  administration of intravenous contrast. CONTRAST:  31mL GADAVIST GADOBUTROL 1 MMOL/ML IV SOLN COMPARISON:  CT right ankle 11/10/2021 FINDINGS: Susceptibility artifact resulting from the orthopedic hardware in the calcaneus partially obscures adjacent soft tissue and osseous structures. TENDONS Peroneal: Peroneal longus tendon intact. Peroneal brevis intact. Posteromedial: Posterior tibial tendon intact. Flexor hallucis longus tendon intact. Flexor digitorum longus tendon intact. Moderate amount of fluid in the flexor hallucis tendon sheath at the level of the tibiotalar joint. Anterior: Tibialis anterior tendon intact. Extensor hallucis longus tendon intact Extensor digitorum longus tendon intact. Achilles:  Intact. Plantar Fascia: Intact. LIGAMENTS Lateral: Chronic tear of the anterior talofibular ligament and calcaneofibular ligament. Prior injury of the posterior talofibular ligament without a complete tear. Anterior and posterior tibiofibular ligaments intact. Postsurgical changes from prior anterior talofibular ligament repair which appears torn. Medial: Not well visualized secondary to susceptibility artifact. CARTILAGE Ankle Joint: Moderate ankle joint effusion. Peripherally enhancing 3.6 x 2.1 x 4 cm fluid collection in the soft tissues along the anterolateral aspect of the ankle joint abutting the lateral malleolus and contiguous with the tibiotalar joint space most concerning for an abscess and septic arthritis. Partial-thickness cartilage loss of the tibiotalar joint. Subtalar Joints/Sinus Tarsi: Normal subtalar joints. No subtalar joint effusion. Normal sinus tarsi. Bones: No acute fracture or dislocation. Complex fluid collection abuts the lateral malleolus with subcortical bone marrow edema concerning for osteomyelitis. Arthrodesis of the first TMT joint. Healed calcaneal osteotomy with orthopedic hardware in place. Mild osteoarthritis of the talonavicular joint and calcaneocuboid joint. Soft Tissue:  Generalized soft tissue edema around the ankle and dorsal foot concerning for cellulitis. Muscles are normal without edema or atrophy. Tarsal tunnel is normal. IMPRESSION: IMPRESSION 1. Moderate ankle joint effusion. Peripherally enhancing 3.6 x 2.1 x 4 cm fluid collection in the soft tissues along the anterolateral aspect of the ankle joint abutting the lateral malleolus and contiguous with the tibiotalar joint space most concerning for an abscess and septic arthritis. Complex fluid collection abuts the lateral malleolus with subcortical  bone marrow edema concerning for osteomyelitis. 2. Generalized soft tissue edema around the ankle and dorsal foot concerning for cellulitis. Electronically Signed   By: Kathreen Devoid M.D.   On: 11/11/2021 07:46   DG Foot Complete Right  Result Date: 11/10/2021 CLINICAL DATA:  Right foot pain and swelling. EXAM: RIGHT FOOT COMPLETE - 3+ VIEW COMPARISON:  June 11, 2021. FINDINGS: There is no evidence of fracture or dislocation. Postsurgical changes are seen involving the calcaneus and first metatarsal. Soft tissues are unremarkable. IMPRESSION: No acute abnormality seen. Electronically Signed   By: Marijo Conception M.D.   On: 11/10/2021 11:37    Medications   Scheduled Meds:  aspirin EC  81 mg Oral Daily   atorvastatin  40 mg Oral Daily   enoxaparin (LOVENOX) injection  40 mg Subcutaneous Q24H   insulin aspart  0-15 Units Subcutaneous TID WC   insulin glargine-yfgn  10 Units Subcutaneous Daily   lisinopril  40 mg Oral Daily   No recently discontinued medications to reconcile  LOS: 2 days   Time spent: >3min  Elvenia Godden L Faigy Stretch, DO [facility] 11/12/2021, 7:51 AM   Available by Epic secure chat 7AM-7PM. If 7PM-7AM, please contact night-coverage Refer to [email or web address] to contact the [facility] Attending or Consulting provider for this pt

## 2021-11-13 ENCOUNTER — Encounter: Payer: Self-pay | Admitting: Internal Medicine

## 2021-11-13 ENCOUNTER — Other Ambulatory Visit: Payer: Self-pay

## 2021-11-13 ENCOUNTER — Inpatient Hospital Stay: Payer: BC Managed Care – PPO | Admitting: Anesthesiology

## 2021-11-13 ENCOUNTER — Encounter
Admission: EM | Disposition: A | Discharge status: Home / Self Care | Payer: Self-pay | Source: Home / Self Care | Attending: Internal Medicine

## 2021-11-13 DIAGNOSIS — M009 Pyogenic arthritis, unspecified: Secondary | ICD-10-CM | POA: Diagnosis not present

## 2021-11-13 DIAGNOSIS — M71071 Abscess of bursa, right ankle and foot: Secondary | ICD-10-CM

## 2021-11-13 DIAGNOSIS — M86171 Other acute osteomyelitis, right ankle and foot: Secondary | ICD-10-CM | POA: Diagnosis present

## 2021-11-13 DIAGNOSIS — M659 Synovitis and tenosynovitis, unspecified: Secondary | ICD-10-CM | POA: Diagnosis not present

## 2021-11-13 HISTORY — PX: IRRIGATION AND DEBRIDEMENT ABSCESS: SHX5252

## 2021-11-13 HISTORY — PX: ANKLE ARTHROSCOPY: SHX545

## 2021-11-13 LAB — GLUCOSE, CAPILLARY
Glucose-Capillary: 177 mg/dL — ABNORMAL HIGH (ref 70–99)
Glucose-Capillary: 137 mg/dL — ABNORMAL HIGH (ref 70–99)
Glucose-Capillary: 109 mg/dL — ABNORMAL HIGH (ref 70–99)
Glucose-Capillary: 111 mg/dL — ABNORMAL HIGH (ref 70–99)
Glucose-Capillary: 283 mg/dL — ABNORMAL HIGH (ref 70–99)

## 2021-11-13 LAB — CBC
HCT: 42.5 % (ref 39.0–52.0)
Hemoglobin: 13.7 g/dL (ref 13.0–17.0)
MCH: 24.7 pg — ABNORMAL LOW (ref 26.0–34.0)
MCHC: 32.2 g/dL (ref 30.0–36.0)
MCV: 76.7 fL — ABNORMAL LOW (ref 80.0–100.0)
Platelets: 226 10*3/uL (ref 150–400)
RBC: 5.54 MIL/uL (ref 4.22–5.81)
RDW: 14.1 % (ref 11.5–15.5)
WBC: 6.7 10*3/uL (ref 4.0–10.5)
nRBC: 0 % (ref 0.0–0.2)

## 2021-11-13 LAB — CREATININE, SERUM
Creatinine, Ser: 0.68 mg/dL (ref 0.61–1.24)
GFR, Estimated: >60 mL/min (ref >60)

## 2021-11-13 SURGERY — ARTHROSCOPY, ANKLE
Anesthesia: General | Site: Ankle | Laterality: Right

## 2021-11-13 MED ORDER — ONDANSETRON HCL 4 MG/2ML IJ SOLN
INTRAMUSCULAR | Status: DC | PRN
  Administered 2021-11-13: 4 mg via INTRAVENOUS

## 2021-11-13 MED ORDER — ACETAMINOPHEN 10 MG/ML IV SOLN
INTRAVENOUS | Status: AC
  Filled 2021-11-13: qty 100

## 2021-11-13 MED ORDER — PROPOFOL 10 MG/ML IV BOLUS
INTRAVENOUS | Status: DC | PRN
  Administered 2021-11-13: 160 mg via INTRAVENOUS

## 2021-11-13 MED ORDER — LIDOCAINE HCL (CARDIAC) PF 100 MG/5ML IV SOSY
PREFILLED_SYRINGE | INTRAVENOUS | Status: DC | PRN
  Administered 2021-11-13: 100 mg via INTRAVENOUS

## 2021-11-13 MED ORDER — OXYCODONE HCL 5 MG PO TABS
5.0000 mg | ORAL_TABLET | Freq: Four times a day (QID) | ORAL | Status: DC | PRN
  Administered 2021-11-13 – 2021-11-14 (×3): 5 mg via ORAL
  Filled 2021-11-13 (×3): qty 1

## 2021-11-13 MED ORDER — ACETAMINOPHEN 10 MG/ML IV SOLN
INTRAVENOUS | Status: DC | PRN
  Administered 2021-11-13: 1000 mg via INTRAVENOUS

## 2021-11-13 MED ORDER — PHENYLEPHRINE 40 MCG/ML (10ML) SYRINGE FOR IV PUSH (FOR BLOOD PRESSURE SUPPORT): PREFILLED_SYRINGE | INTRAVENOUS | Status: DC | PRN

## 2021-11-13 MED ORDER — HYDRALAZINE HCL 25 MG PO TABS
25.0000 mg | ORAL_TABLET | Freq: Four times a day (QID) | ORAL | Status: DC | PRN
  Filled 2021-11-13: qty 1

## 2021-11-13 MED ORDER — VANCOMYCIN HCL 1500 MG/300ML IV SOLN
1500.0000 mg | Freq: Two times a day (BID) | INTRAVENOUS | Status: DC
Start: 2021-11-14 — End: 2021-11-15
  Administered 2021-11-14 – 2021-11-15 (×3): 1500 mg via INTRAVENOUS
  Filled 2021-11-13 (×4): qty 300

## 2021-11-13 MED ORDER — ACETAMINOPHEN 500 MG PO TABS: 1000.0000 mg | ORAL_TABLET | Freq: Four times a day (QID) | ORAL | Status: DC | PRN

## 2021-11-13 MED ORDER — LIDOCAINE HCL (PF) 1 % IJ SOLN
INTRAMUSCULAR | Status: AC
  Filled 2021-11-13: qty 30

## 2021-11-13 MED ORDER — MIDAZOLAM HCL 2 MG/2ML IJ SOLN
INTRAMUSCULAR | Status: AC
  Filled 2021-11-13: qty 2

## 2021-11-13 MED ORDER — MIDAZOLAM HCL 2 MG/2ML IJ SOLN
INTRAMUSCULAR | Status: DC | PRN
  Administered 2021-11-13: 2 mg via INTRAVENOUS

## 2021-11-13 MED ORDER — DEXMEDETOMIDINE (PRECEDEX) IN NS 20 MCG/5ML (4 MCG/ML) IV SYRINGE
PREFILLED_SYRINGE | INTRAVENOUS | Status: DC | PRN
  Administered 2021-11-13: 4 ug via INTRAVENOUS

## 2021-11-13 MED ORDER — FENTANYL CITRATE (PF) 100 MCG/2ML IJ SOLN: 25.0000 ug | INTRAMUSCULAR | Status: DC | PRN

## 2021-11-13 MED ORDER — LIDOCAINE HCL (PF) 1 % IJ SOLN
INTRAMUSCULAR | Status: DC | PRN
  Administered 2021-11-13: 17:00:00 20 mL

## 2021-11-13 MED ORDER — ACETAMINOPHEN 650 MG RE SUPP
650.0000 mg | Freq: Four times a day (QID) | RECTAL | Status: DC | PRN
  Filled 2021-11-13: qty 1

## 2021-11-13 MED ORDER — PHENYLEPHRINE HCL (PRESSORS) 10 MG/ML IV SOLN
INTRAVENOUS | Status: DC | PRN
  Administered 2021-11-13 (×6): 160 ug via INTRAVENOUS

## 2021-11-13 MED ORDER — FENTANYL CITRATE (PF) 100 MCG/2ML IJ SOLN
INTRAMUSCULAR | Status: AC
  Filled 2021-11-13: qty 2

## 2021-11-13 MED ORDER — PROMETHAZINE HCL 25 MG/ML IJ SOLN: 6.2500 mg | INTRAMUSCULAR | Status: DC | PRN

## 2021-11-13 MED ORDER — FENTANYL CITRATE (PF) 100 MCG/2ML IJ SOLN
INTRAMUSCULAR | Status: DC | PRN
  Administered 2021-11-13 (×2): 25 ug via INTRAVENOUS
  Administered 2021-11-13: 50 ug via INTRAVENOUS

## 2021-11-13 MED ORDER — BUPIVACAINE HCL (PF) 0.5 % IJ SOLN
INTRAMUSCULAR | Status: AC
  Filled 2021-11-13: qty 30

## 2021-11-13 SURGICAL SUPPLY — 43 items
BAG COUNTER SPONGE SURGICOUNT (BAG) ×3 IMPLANT
BAG SPNG CNTER NS LX DISP (BAG) ×2
BAG SURGICOUNT SPONGE COUNTING (BAG) ×1
BLADE SHAVER 2.9D 7 MINI (BLADE) ×1 IMPLANT
BLADE SHAVER 2.9D 7CML MINI (BLADE) ×1
BNDG CMPR STD VLCR NS LF 5.8X3 (GAUZE/BANDAGES/DRESSINGS)
BNDG CMPR STD VLCR NS LF 5.8X4 (GAUZE/BANDAGES/DRESSINGS)
BNDG CONFORM 2 STRL LF (GAUZE/BANDAGES/DRESSINGS) IMPLANT
BNDG CONFORM 3 STRL LF (GAUZE/BANDAGES/DRESSINGS) IMPLANT
BNDG ELASTIC 3X5.8 VLCR NS LF (GAUZE/BANDAGES/DRESSINGS) IMPLANT
BNDG ELASTIC 4X5.8 VLCR NS LF (GAUZE/BANDAGES/DRESSINGS) IMPLANT
BNDG ELASTIC 4X5.8 VLCR STR LF (GAUZE/BANDAGES/DRESSINGS) ×2 IMPLANT
BNDG ESMARK 4X12 TAN STRL LF (GAUZE/BANDAGES/DRESSINGS) ×4 IMPLANT
BNDG GAUZE ELAST 4 BULKY (GAUZE/BANDAGES/DRESSINGS) ×4 IMPLANT
CUFF TOURN SGL QUICK 18X4 (TOURNIQUET CUFF) ×2 IMPLANT
ELECT REM PT RETURN 9FT ADLT (ELECTROSURGICAL) ×4
ELECTRODE REM PT RTRN 9FT ADLT (ELECTROSURGICAL) ×2 IMPLANT
GAUZE 4X4 16PLY ~~LOC~~+RFID DBL (SPONGE) ×4 IMPLANT
GAUZE PACKING IODOFORM 1/2 (PACKING) ×2 IMPLANT
GAUZE SPONGE 4X4 12PLY STRL (GAUZE/BANDAGES/DRESSINGS) ×4 IMPLANT
GLOVE SURG ENC MOIS LTX SZ7 (GLOVE) ×4 IMPLANT
GLOVE SURG UNDER LTX SZ7 (GLOVE) ×4 IMPLANT
GOWN STRL REUS W/ TWL LRG LVL3 (GOWN DISPOSABLE) ×4 IMPLANT
GOWN STRL REUS W/TWL LRG LVL3 (GOWN DISPOSABLE) ×8
IV NS IRRIG 3000ML ARTHROMATIC (IV SOLUTION) ×2 IMPLANT
KIT TURNOVER KIT A (KITS) ×4 IMPLANT
LABEL OR SOLS (LABEL) IMPLANT
MANIFOLD NEPTUNE II (INSTRUMENTS) ×4 IMPLANT
NDL FILTER BLUNT 18X1 1/2 (NEEDLE) ×2 IMPLANT
NDL HYPO 25X1 1.5 SAFETY (NEEDLE) ×4 IMPLANT
NEEDLE FILTER BLUNT 18X 1/2SAF (NEEDLE) ×2
NEEDLE FILTER BLUNT 18X1 1/2 (NEEDLE) ×2 IMPLANT
NEEDLE HYPO 25X1 1.5 SAFETY (NEEDLE) ×8 IMPLANT
PACK EXTREMITY ARMC (MISCELLANEOUS) ×4 IMPLANT
PAD ABD DERMACEA PRESS 5X9 (GAUZE/BANDAGES/DRESSINGS) ×4 IMPLANT
SOL PREP PVP 2OZ (MISCELLANEOUS) ×8
SOLUTION PREP PVP 2OZ (MISCELLANEOUS) IMPLANT
STOCKINETTE STRL 6IN 960660 (GAUZE/BANDAGES/DRESSINGS) ×4 IMPLANT
SUT PROLENE 2 0 FS (SUTURE) ×2 IMPLANT
SUT PROLENE 3 0 PS 2 (SUTURE) ×2 IMPLANT
SYR 10ML LL (SYRINGE) ×4 IMPLANT
TUBING INFLOW SET DBFLO PUMP (TUBING) ×2 IMPLANT
TUBING OUTFLOW SET DBLFO PUMP (TUBING) ×2 IMPLANT

## 2021-11-13 NOTE — Assessment & Plan Note (Signed)
Last therapeutic phlebotomy in October 2022.  RBC 6.0 on admission, hemoglobin 15.2, hematocrit 47.4.  In the setting of testosterone replacement therapy.  Status post phlebotomy on 12/12. -- Continue as needed therapeutic phlebotomy --low-dose aspirin -- Daily CBCs to monitor

## 2021-11-13 NOTE — Assessment & Plan Note (Signed)
Assessment management as outlined, see acute osteomyelitis

## 2021-11-13 NOTE — Progress Notes (Signed)
Progress Note    Cylus Douville Beshears   NAT:[id number]  DOB: 1956-10-09  DOA: 11/10/2021     3 Date of Service: 11/13/2021   Brief narrative Jonathan Fields is a 65 y.o. male with a PMH significant for type II DM, HTN, BPH, OSA, cavus deformity right foot s/p stabilization 42/7062 complicated by wound dehiscence and failed outpatient antibiotic treatment.  He presented from home to the ED on 11/10/2021 with right ankle pain redness, draining and swelling x several days. In the ED, it was found that they had cellulitis and abscess, started on IV antibiotics. Podiatry was consulted and performed bedside I&D for abscess.  MRI showed signs of osteomyelitis with septic arthritis.  Orthopedic surgery was also consulted.   Patient admitted to medicine service for further workup and management of right ankle septic arthritis as outlined in detail below.      Assessment and Plan * Postoperative cellulitis and abscess of surgical wound right ankle Assessment management as outlined, see acute osteomyelitis  Acute osteomyelitis of right ankle ([place]) In setting of right ankle stabilization surgery in October 2022.  Podiatry performed bedside I&D with packing of abscess on admission, 11/11/2021. -- Podiatry, Ortho, infectious disease consulted -- Continue IV vancomycin -- Monitor fever curve, CBC -- Plan for washout in the OR today -- Follow operative cultures  Cavovarus deformity of foot, acquired, right Status post surgery for stabilization of the ankle over, now with complications as outlined.  Hyperglycemia due to type 2 diabetes mellitus ([place]) Diabetes uncontrolled with A1c 9.5% (11/04/2021), up from 7.8% in May -- Continue sliding scale NovoLog -- Semglee 10 units daily -- Adjust insulin as needed for inpatient goal 140-180  Erythrocytosis Last therapeutic phlebotomy in October 2022.  RBC 6.0 on admission, hemoglobin 15.2, hematocrit 47.4.  In the setting of testosterone replacement  therapy.  Status post phlebotomy on 12/12. -- Continue as needed therapeutic phlebotomy --low-dose aspirin -- Daily CBCs to monitor  Hypertension BP elevated on admission.  Continues to be elevated intermittently. -- Continue lisinopril 40 mg daily --As needed hydralazine --Consider addition of second agent if BP remains uncontrolled -- Monitor BP    Subjective:  Patient seen awake resting in bed today prior to going to the OR for washout.  He reports having some intermittent chills.  He is n.p.o. and reports being quite hungry.  Otherwise he has no complaints today.  No acute events reported.  Objective Vitals:   11/12/21 2005 11/13/21 0351 11/13/21 0725 11/13/21 1533  BP: (!) 143/90 (!) 144/84 (!) 153/100 (!) 162/97  Pulse: 84 89 89 86  Resp: 20 20 18 18   Temp: 98.9 F (37.2 C) 98.4 F (36.9 C) 98.3 F (36.8 C) 98.6 F (37 C)  TempSrc: Oral Oral Oral Oral  SpO2: 96% 96% 95% 98%  Weight:    102.1 kg  Height:    6\' 2"  (1.88 m)   102.1 kg  Vital signs were reviewed and unremarkable except for: Blood pressure: Elevated 143/90    Exam General exam: awake, alert, no acute distress HEENT: atraumatic, clear conjunctiva, anicteric sclera, moist mucus membranes, hearing grossly normal  Respiratory system: CTAB, no wheezes, rales or rhonchi, normal respiratory effort. Cardiovascular system: normal S1/S2, RRR, no JVD, murmurs, rubs, gallops, no pedal edema.   Central nervous system: A&O x3. no gross focal neurologic deficits, normal speech Extremities: Right ankle with clean dry intact gauze dressing in place, distal sensation intact, no edema, normal tone Skin: dry, intact, normal temperature Psychiatry: normal  mood, congruent affect, judgement and insight appear normal   Labs / Other Information My review of labs, imaging, notes and other tests is significant for Glucose mildly elevated with CBGs 177, 137, 109     Disposition Plan: Status is: Inpatient  Remains  inpatient appropriate because: Ongoing evaluation for septic ankle and possible osteomyelitis going to OR for washout today, cultures pending to guide antibiotics.        Time spent: 30 minutes [facility] 11/13/2021, 3:41 PM

## 2021-11-13 NOTE — Consult Note (Signed)
Pharmacy Antibiotic Note  Jonathan Fields is a 65 y.o. male w/ PMH DM, HTN, BPH admitted on 11/10/2021 with cavus deformity right foot s/p stabilization 08/7352 complicated by wound dehiscence and failed outpatient antibiotic treatment. Pharmacy has been consulted for vancomycin dosing.  Plan: adjust vancomycin dose to 1500 mg IV every 12  hours Goal AUC 400-550  Est AUC: 449.6 Ke: 0.091 h-1, T1/2: 7.6 h Daily renal function assessment while on IV vancomycin Levels as clinically indicated   Height: 6\' 2"  (188 cm) Weight: 102.1 kg (225 lb) IBW/kg (Calculated) : 82.2  Temp (24hrs), Avg:98.5 F (36.9 C), Min:98.3 F (36.8 C), Max:98.9 F (37.2 C)  Recent Labs  Lab 11/10/21 1046 11/10/21 1534 11/10/21 1535 11/10/21 2124 11/11/21 0427 11/11/21 0431 11/12/21 0242 11/13/21 0232  WBC 9.6 8.3  --   --   --  6.6 7.7 6.7  CREATININE 0.95  --   --   --  0.80  --  0.74 0.68  LATICACIDVEN  --   --  2.1* 1.4  --   --   --   --      Estimated Creatinine Clearance: 114.3 mL/min (by C-G formula based on SCr of 0.68 mg/dL).    Allergies  Allergen Reactions   Penicillins Hives    Has patient had a PCN reaction causing immediate rash, facial/tongue/throat swelling, SOB or lightheadedness with hypotension: yes Has patient had a PCN reaction causing severe rash involving mucus membranes or skin necrosis: no Has patient had a PCN reaction that required hospitalization: no Has patient had a PCN reaction occurring within the last 10 years: no If all of the above answers are "NO", then may proceed with Cephalosporin use.     Antimicrobials this admission: 12/11 ceftriaxone >> 12/12 12/11 vancomycin >>   Microbiology results: 12/11 BCx: NG x 3 days 12/12 WCx: rare S lugdunensis  12/11 SARS CoV-2: negative 12/11 influenza A/B: negative  Thank you for allowing pharmacy to be a part of this patients care.  Dallie Piles, PharmD 11/13/2021 2:40 PM

## 2021-11-13 NOTE — Op Note (Signed)
Patient Name: Jonathan Fields DOB: [birth year]  MRN: [id number]   Date of Service: 11/10/2021 - 11/13/2021  Surgeon: Dr. Lanae Crumbly, DPM Assistants: None Pre-operative Diagnosis:  Right ankle abscess Right ankle septic arthritis Post-operative Diagnosis:  Right ankle abscess Right ankle septic arthritis Procedures:  1) right ankle arthroscopy with extensive debridement  2) incision and drainage of abscess of right ankle Pathology/Specimens: ID Type Source Tests Collected by Time Destination  A : Right Ankle Abscess Tissue PATH Soft tissue AEROBIC/ANAEROBIC CULTURE W GRAM STAIN (SURGICAL/DEEP WOUND) [name], DPM 11/13/2021 1658    Anesthesia: General Hemostasis:  Total Tourniquet Time Documented: Calf (Right) - 42 minutes Total: Calf (Right) - 42 minutes  Estimated Blood Loss: Minimal Materials: * No implants in log * Medications: 10 cc each of 0.5% Marcaine plain and 2% lidocaine plain Complications: None  Indications for Procedure:  This is a 65 y.o. male with a history of type 2 diabetes, cavus foot deformity and ankle instability that previously underwent ankle stabilization in October of this year.  He was doing well other than a superficial wound dehiscence which had healed approximately 1 week ago.  He presented to the ER with swelling pain and concern for abscess in the ankle around the surgical site which was revealed by MRI.  Surgical debridement and I&D was recommended.   Procedure in Detail: Patient was identified in pre-operative holding area. Formal consent was signed and the right lower extremity was marked. Patient was brought back to the operating room. Anesthesia was induced. The extremity was prepped and draped in the usual sterile fashion. Timeout was taken to confirm patient name, laterality, and procedure prior to incision.   Attention was then directed to the right ankle where a significant amount of swelling was present over the lateral ankle  with an open ulcer.  Purulent discharge was emanating from this.  I began by making a medial portal just medial to the tibialis anterior tendon to enter the ankle joint.  Upon entry of the trocar and the cannula purulent drainage emanated from the joint through the cannula.  I introduced the camera and irrigation and purulence and significant amount of synovitis was present in the anterior ankle.  I ran the irrigation to clear out the infection through a counterincision lateral portal.  I introduced a shaver and began debridement extensively throughout the anterior ankle joint of the anterior ankle synovitis.  Once this was completed I began incision and drainage through the large abscess on the outside of the ankle which had a significant amount of purulence.  It communicated with the ankle joint.  Also present in the deeper portions of the abscess were prior suture material from ankle stabilization.  This communicated directly down to the level of the suture anchors.  I debrided this and remove the loose portions of the suture.  I did not remove the suture anchors and repair yet until I can discuss further with my partner who performed the surgery.  Thorough amounts of irrigation was instilled throughout this portion of the wound until no further purulence or necrotic tissue was found after thorough debridement.  A small piece of necrotic tissue was sent for tissue culture.  A total of 3 L of normal sterile saline was used.  Once adequate irrigation and debridement was completed the portals were closed with 3-0 Prolene and the lateral incision was closed with retention sutures with 2-0 Prolene.  Iodinated packing was placed into the wound as well as dry sterile  dressings and an Ace wrap.  He tolerated the procedure well and was aroused from anesthesia and transferred to the PACU    Disposition: Following a period of post-operative monitoring, patient will be transferred back to the floor.  I expect that he will  need 6 weeks IV antibiotics through PICC line due to the communication with the suture anchors.  I will discuss his case further with my partner and his original surgeon regarding the repair of the ankle ligaments and whether or not the suture anchor should be removed.  Expect he will return to the operating room at a later date within 2 to 3 days.

## 2021-11-13 NOTE — Hospital Course (Signed)
Jonathan Fields is a 65 y.o. male with a PMH significant for type II DM, HTN, BPH, OSA, cavus deformity right foot s/p stabilization 65/4650 complicated by wound dehiscence and failed outpatient antibiotic treatment.  He presented from home to the ED on 11/10/2021 with right ankle pain redness, draining and swelling x several days. In the ED, it was found that they had cellulitis and abscess, started on IV antibiotics. Podiatry was consulted and performed bedside I&D for abscess.  MRI showed signs of osteomyelitis with septic arthritis.  Orthopedic surgery was also consulted.   Patient admitted to medicine service for further workup and management of right ankle septic arthritis as outlined in detail below.

## 2021-11-13 NOTE — Anesthesia Postprocedure Evaluation (Signed)
Anesthesia Post Note  Patient: Jacquelyn Antony Cousar  Procedure(s) Performed: IRRIGATION AND DEBRIDEMENT ABSCESS (Right) ANKLE ARTHROSCOPY Arthroscopic Irrigation (Right: Ankle)  Patient location during evaluation: PACU Anesthesia Type: General Level of consciousness: awake and alert Pain management: pain level controlled Vital Signs Assessment: post-procedure vital signs reviewed and stable Respiratory status: spontaneous breathing, nonlabored ventilation, respiratory function stable and patient connected to nasal cannula oxygen Cardiovascular status: blood pressure returned to baseline and stable Postop Assessment: no apparent nausea or vomiting Anesthetic complications: no   No notable events documented.   Last Vitals:  Vitals:   11/13/21 1842 11/13/21 2027  BP: 132/70 109/77  Pulse: 80 90  Resp:  20  Temp: 36.9 C 37.1 C  SpO2: 95% 95%    Last Pain:  Vitals:   11/13/21 2027  TempSrc: Oral  PainSc:                  Martha Clan

## 2021-11-13 NOTE — Assessment & Plan Note (Signed)
In setting of right ankle stabilization surgery in October 2022.  Podiatry performed bedside I&D with packing of abscess on admission, 11/11/2021. -- Podiatry, Ortho, infectious disease consulted -- Continue IV vancomycin -- Monitor fever curve, CBC -- Plan for washout in the OR today -- Follow operative cultures

## 2021-11-13 NOTE — Transfer of Care (Signed)
Immediate Anesthesia Transfer of Care Note  Patient: Jonathan Fields  Procedure(s) Performed: Procedure(s): IRRIGATION AND DEBRIDEMENT ABSCESS (Right) ANKLE ARTHROSCOPY Arthroscopic Irrigation (Right)  Patient Location: PACU  Anesthesia Type:General  Level of Consciousness: sedated  Airway & Oxygen Therapy: Patient Spontanous Breathing and Patient connected to face mask oxygen  Post-op Assessment: Report given to RN and Post -op Vital signs reviewed and stable  Post vital signs: Reviewed and stable  Last Vitals:  Vitals:   11/13/21 1749 11/13/21 1800  BP: 131/84 126/84  Pulse: 81 85  Resp: 20 19  Temp: 36.7 C   SpO2: 964% 38%    Complications: No apparent anesthesia complications

## 2021-11-13 NOTE — Progress Notes (Signed)
Inpatient Diabetes Program Recommendations  AACE/ADA: New Consensus Statement on Inpatient Glycemic Control   Target Ranges:  Prepandial:   less than 140 mg/dL      Peak postprandial:   less than 180 mg/dL (1-2 hours)      Critically ill patients:  140 - 180 mg/dL    Latest Reference Range & Units 11/13/21 07:22  Glucose-Capillary 70 - 99 mg/dL 177 (H)    Latest Reference Range & Units 11/12/21 07:53 11/12/21 11:29 11/12/21 17:16 11/12/21 20:38  Glucose-Capillary 70 - 99 mg/dL 157 (H) 172 (H) 271 (H) 238 (H)   Review of Glycemic Control  Admit with: Postoperative cellulitis and abscess of surgical wound right ankle s/p right lateral ankle stabilization procedure on 82/05/4157, complicated by 2 postoperative dehiscence    History: DM2   Home DM Meds: Metformin 1000 mg BID                             Farxiga 5 mg Daily (Hasn't started taking yet--new Rx just filled last week)   Current Orders: Semglee 10 units daily                           Novolog 0-15 units TID   Inpatient Diabetes Program Recommendations:    Insulin: Once diet resumed please consider ordering Novolog 2-3 units TID with meals for meal coverage if patient eats at least 50% of meals.  Thanks, Barnie Alderman, RN, MSN, CDE Diabetes Coordinator Inpatient Diabetes Program [phone] (Team Pager from 8am to 5pm)

## 2021-11-13 NOTE — Anesthesia Procedure Notes (Signed)
Procedure Name: Intubation Date/Time: 11/13/2021 4:42 PM Performed by: Fredderick Phenix, CRNA Pre-anesthesia Checklist: Patient identified, Emergency Drugs available, Suction available and Patient being monitored Patient Re-evaluated:Patient Re-evaluated prior to induction Oxygen Delivery Method: Circle system utilized Preoxygenation: Pre-oxygenation with 100% oxygen Induction Type: IV induction Ventilation: Mask ventilation without difficulty Tube type: Oral Tube size: 5.0 mm Number of attempts: 1 Airway Equipment and Method: Oral airway Placement Confirmation: ETT inserted through vocal cords under direct vision, positive ETCO2 and breath sounds checked- equal and bilateral Tube secured with: Tape Dental Injury: Teeth and Oropharynx as per pre-operative assessment

## 2021-11-13 NOTE — Assessment & Plan Note (Signed)
Diabetes uncontrolled with A1c 9.5% (11/04/2021), up from 7.8% in May -- Continue sliding scale NovoLog -- Semglee 10 units daily -- Adjust insulin as needed for inpatient goal 140-180

## 2021-11-13 NOTE — Assessment & Plan Note (Signed)
BP elevated on admission.  Continues to be elevated intermittently. -- Continue lisinopril 40 mg daily --As needed hydralazine --Consider addition of second agent if BP remains uncontrolled -- Monitor BP

## 2021-11-13 NOTE — H&P (Signed)
History and Physical Interval Note:  11/13/2021 4:25 PM  Jonathan Fields  has presented today for surgery, with the diagnosis of right ankle infection.  The various methods of treatment have been discussed with the patient and family. After consideration of risks, benefits and other options for treatment, the patient has consented to   Procedure(s): IRRIGATION AND DEBRIDEMENT ABSCESS (Right) as a surgical intervention.  The patient's history has been reviewed, patient examined, no change in status, stable for surgery.  I have reviewed the patient's chart and labs.  Questions were answered to the patient's satisfaction.     [name]

## 2021-11-13 NOTE — Anesthesia Preprocedure Evaluation (Addendum)
Anesthesia Evaluation  Patient identified by MRN, date of birth, ID band Patient awake    Reviewed: Allergy & Precautions, NPO status , Patient's Chart, lab work & pertinent test results  History of Anesthesia Complications (+) AWARENESS UNDER ANESTHESIA and history of anesthetic complications (and delayed emergence)  Airway Mallampati: III       Dental  (+) Dental Advidsory Given, Caps, Teeth Intact   Pulmonary neg shortness of breath, sleep apnea (not able to tolerate CPAP) , neg COPD, neg recent URI, Not current smoker,  H/o TB lung, latent          Cardiovascular hypertension, Pt. on medications (-) angina+ Peripheral Vascular Disease  (-) Past MI, (-) Cardiac Stents and (-) CHF (-) dysrhythmias (-) Valvular Problems/Murmurs  H/o DVT   Neuro/Psych neg Seizures    GI/Hepatic Neg liver ROS, GERD  Medicated and Controlled,  Endo/Other  diabetes, Type 2, Oral Hypoglycemic Agents  Renal/GU negative Renal ROS     Musculoskeletal  (+) Arthritis , Osteoarthritis,    Abdominal   Peds  Hematology  (+) Blood dyscrasia (Erythrocyosis s/p therapuetic phlebotomy), ,   Anesthesia Other Findings Past Medical History: No date: BPH (benign prostatic hyperplasia) No date: Bronchitis No date: Complication of anesthesia     Comment:  hard to wake up No date: DM (diabetes mellitus), type 2 ([facility]) No date: DVT (deep venous thrombosis) ([facility])     Comment:  post op No date: ED (erectile dysfunction) 07/04/2020: Erythrocytosis No date: GERD (gastroesophageal reflux disease) No date: Hyperlipemia No date: Hypertension No date: Hypogonadism in male No date: Nocturia No date: Right ankle instability No date: Sleep apnea No date: TB lung, latent No date: Urinary frequency   Reproductive/Obstetrics                            Anesthesia Physical  Anesthesia Plan  ASA: 2  Anesthesia Plan: General    Post-op Pain Management:    Induction: Intravenous  PONV Risk Score and Plan: 2 and Ondansetron, Midazolam and Treatment may vary due to age or medical condition  Airway Management Planned: LMA  Additional Equipment:   Intra-op Plan:   Post-operative Plan: Extubation in OR  Informed Consent:   Plan Discussed with:   Anesthesia Plan Comments:        Anesthesia Quick Evaluation

## 2021-11-13 NOTE — Brief Op Note (Signed)
11/13/2021  5:53 PM  PATIENT:  Jonathan Fields  65 y.o. male  PRE-OPERATIVE DIAGNOSIS:  Right Ankle Abscess  POST-OPERATIVE DIAGNOSIS:  Right Ankle Abscess and  Septic Arthritis  PROCEDURE:  Procedure(s): IRRIGATION AND DEBRIDEMENT ABSCESS (Right) ANKLE ARTHROSCOPY Arthroscopic Irrigation (Right)  SURGEON:  Surgeon(s) and Role:    * Leeon Makar, Stephan Minister, DPM - Primary  3  ASSISTANTS: none   ANESTHESIA:   general  EBL:  minimal   BLOOD ADMINISTERED:none  DRAINS: none   LOCAL MEDICATIONS USED:  MARCAINE   , BUPIVICAINE , and Amount: 20 ml  SPECIMEN:  lateral ankle abscess tissue culture  DISPOSITION OF SPECIMEN:  microbiology  COUNTS:  YES  TOURNIQUET:   Total Tourniquet Time Documented: Calf (Right) - 42 minutes Total: Calf (Right) - 42 minutes   DICTATION: .Note written in EPIC  PLAN OF CARE: Admit to inpatient   PATIENT DISPOSITION:  PACU - hemodynamically stable.   Delay start of Pharmacological VTE agent (>24hrs) due to surgical blood loss or risk of bleeding: yes

## 2021-11-13 NOTE — Assessment & Plan Note (Signed)
Status post surgery for stabilization of the ankle over, now with complications as outlined.

## 2021-11-14 ENCOUNTER — Encounter: Payer: Self-pay | Admitting: Podiatry

## 2021-11-14 DIAGNOSIS — M00071 Staphylococcal arthritis, right ankle and foot: Secondary | ICD-10-CM

## 2021-11-14 LAB — MAGNESIUM: Magnesium: 2 mg/dL (ref 1.7–2.4)

## 2021-11-14 LAB — GLUCOSE, CAPILLARY
Glucose-Capillary: 174 mg/dL — ABNORMAL HIGH (ref 70–99)
Glucose-Capillary: 207 mg/dL — ABNORMAL HIGH (ref 70–99)
Glucose-Capillary: 156 mg/dL — ABNORMAL HIGH (ref 70–99)
Glucose-Capillary: 183 mg/dL — ABNORMAL HIGH (ref 70–99)

## 2021-11-14 LAB — BASIC METABOLIC PANEL
Anion gap: 8 (ref 5–15)
BUN: 21 mg/dL (ref 8–23)
CO2: 24 mmol/L (ref 22–32)
Calcium: 8.4 mg/dL — ABNORMAL LOW (ref 8.9–10.3)
Chloride: 102 mmol/L (ref 98–111)
Creatinine, Ser: 0.85 mg/dL (ref 0.61–1.24)
GFR, Estimated: >60 mL/min (ref >60)
Glucose, Bld: 217 mg/dL — ABNORMAL HIGH (ref 70–99)
Potassium: 3.9 mmol/L (ref 3.5–5.1)
Sodium: 134 mmol/L — ABNORMAL LOW (ref 135–145)

## 2021-11-14 LAB — CBC
HCT: 42 % (ref 39.0–52.0)
Hemoglobin: 13.6 g/dL (ref 13.0–17.0)
MCH: 25.1 pg — ABNORMAL LOW (ref 26.0–34.0)
MCHC: 32.4 g/dL (ref 30.0–36.0)
MCV: 77.5 fL — ABNORMAL LOW (ref 80.0–100.0)
Platelets: 260 10*3/uL (ref 150–400)
RBC: 5.42 MIL/uL (ref 4.22–5.81)
RDW: 14.1 % (ref 11.5–15.5)
WBC: 6.6 10*3/uL (ref 4.0–10.5)
nRBC: 0 % (ref 0.0–0.2)

## 2021-11-14 MED ORDER — INSULIN ASPART 100 UNIT/ML IJ SOLN
3.0000 [IU] | Freq: Three times a day (TID) | INTRAMUSCULAR | Status: DC
  Administered 2021-11-14 – 2021-11-16 (×7): 3 [IU] via SUBCUTANEOUS
  Filled 2021-11-14 (×7): qty 1

## 2021-11-14 MED ORDER — OXYCODONE HCL 5 MG PO TABS
10.0000 mg | ORAL_TABLET | Freq: Four times a day (QID) | ORAL | Status: DC | PRN
  Administered 2021-11-15: 10 mg via ORAL
  Filled 2021-11-14: qty 2

## 2021-11-14 MED ORDER — MORPHINE SULFATE (PF) 2 MG/ML IV SOLN
2.0000 mg | Freq: Once | INTRAVENOUS | Status: AC
  Administered 2021-11-14: 2 mg via INTRAVENOUS
  Filled 2021-11-14: qty 1

## 2021-11-14 NOTE — Assessment & Plan Note (Signed)
Status post surgery for stabilization of the ankle over, now with complications as outlined.

## 2021-11-14 NOTE — Assessment & Plan Note (Signed)
Assessment management as outlined, see acute osteomyelitis

## 2021-11-14 NOTE — Assessment & Plan Note (Signed)
Last therapeutic phlebotomy in October 2022.  RBC 6.0 on admission, hemoglobin 15.2, hematocrit 47.4.  In the setting of testosterone replacement therapy.  Status post phlebotomy on 12/12. -- Continue as needed therapeutic phlebotomy --low-dose aspirin -- Daily CBCs to monitor

## 2021-11-14 NOTE — Assessment & Plan Note (Signed)
On IV antibiotics as outlined. Follow cultures.

## 2021-11-14 NOTE — Assessment & Plan Note (Signed)
BP elevated on admission.  Continues to be elevated intermittently. -- Continue lisinopril 40 mg daily --As needed hydralazine --Consider addition of second agent if BP remains uncontrolled -- Monitor BP

## 2021-11-14 NOTE — Assessment & Plan Note (Signed)
Management per podiatry. Further I&D washout and debridement in the OR as planned tomorrow 12/16. N.p.o. after midnight Nonweightbearing on the right lower extremity

## 2021-11-14 NOTE — Consult Note (Signed)
NAME: Jonathan Fields  DOB: 1956/09/22  MRN: [id number]  Date/Time: 11/14/2021 2:47 PM  REQUESTING PROVIDER: Dr. Arbutus Ped Subjective:  REASON FOR CONSULT: rtfoot infection ? Jonathan Fields is a 65 y.o. with a history of DM, erythrocytosis sec to chronic testosterone use, frequent phlebotomy, cavus foot deformity b/l ankle surgery Admitted with rt foot pain and swelling and redness since last Friday. Pt on 09/05/21 had a ankle stabilization procedure to the rt foot. On his follow up visit on 09/12/21 there was some erythema noted without any signs of dehiscence and he was prescribed Doxy.He was having weekly follow ups and the foot was doing okay. Last seen on 12/6 by podiatrist and the note says small area of dehiscence  along the incision site has healed  He came to the ED on 11/10/21 with 2 day h/o swelling, pain and redness in the rt ankle at the surgical scar. He had chills but did not check temp In the ED BP 154/91, temp 98.7, Pulse 78, RR 14 and sats 95%. Wbc 8.3, HB 15.2, PLT 243. Blood culture sent and started on ceftriaxone and vanco.MRI showed Moderate ankle joint effusion. Peripherally enhancing 3.6 x 2.1 x 4 cm fluid collection in the soft tissues along the anterolateral aspect of the ankle joint abutting the lateral malleolus and contiguous with the tibiotalar joint space most concerning for an abscess and septic arthritis HE was seen by podiatrist on 12/12/22and underwent a bed side I/D of the rt ankle abscess and culture sent On 11/13/21 he was taken to OR and underwent right ankle arthroscopy with extensive debridement             2) incision and drainage of abscess of right ankle I am seeing the patient for management of infection Culture from 12/12 is growing staph lugdunensis Pt in 2008 had reconstruction surgery and hardware in calcaneum  Past Medical History:  Diagnosis Date   BPH (benign prostatic hyperplasia)    Bronchitis    Complication of anesthesia    hard to  wake up   DM (diabetes mellitus), type 2 ([place])    DVT (deep venous thrombosis) ([place])    post op   ED (erectile dysfunction)    Erythrocytosis 07/04/2020   GERD (gastroesophageal reflux disease)    Hyperlipemia    Hypertension    Hypogonadism in male    Nocturia    Right ankle instability    Sleep apnea    TB lung, latent    Urinary frequency     Past Surgical History:  Procedure Laterality Date   ANKLE ARTHROSCOPY Right 11/13/2021   Procedure: ANKLE ARTHROSCOPY Arthroscopic Irrigation;  Surgeon: [name], DPM;  Location: [facility] ORS;  Service: Podiatry;  Laterality: Right;   ANKLE SURGERY Left    torn ligament   CARPAL TUNNEL RELEASE Right 08/2019   COLONOSCOPY  2009   IRRIGATION AND DEBRIDEMENT ABSCESS Right 11/13/2021   Procedure: IRRIGATION AND DEBRIDEMENT ABSCESS;  Surgeon: [name], DPM;  Location: [facility] OR[facility];  Service: Podiatry;  Laterality: Right;   KNEE ARTHROSCOPY Left    right ankle ruptured tendon surgery  2008   TRANSURETHRAL RESECTION OF PROSTATE N/A 02/19/2018   Procedure: TRANSURETHRAL RESECTION OF THE PROSTATE (TURP);  Surgeon: [name], MD;  Location: [facility] OR[facility];  Service: Urology;  Laterality: N/A;    Social History   Socioeconomic History   Marital status: Married    Spouse name: Not on file   Number of children: 0  Years of education: Not on file   Highest education level: Not on file  Occupational History   Not on file  Tobacco Use   Smoking status: Never   Smokeless tobacco: Current    Types: Snuff   Tobacco comments:    tobaco pouches that produce juice  Vaping Use   Vaping Use: Never used  Substance and Sexual Activity   Alcohol use: Yes    Comment: COUPLE OF DRINKS EVERY WEEK   Drug use: No   Sexual activity: Yes    Partners: Female    Birth control/protection: None  Other Topics Concern   Not on file  Social History Narrative   Not on file   Social Determinants of Health   Financial Resource Strain: Not on file   Food Insecurity: Not on file  Transportation Needs: Not on file  Physical Activity: Not on file  Stress: Not on file  Social Connections: Not on file  Intimate Partner Violence: Not on file    Family History  Problem Relation Age of Onset   Heart disease Father    Hypertension Father    Prostate cancer Father    Heart disease Mother    Breast cancer Mother    Colon cancer Neg Hx    Allergies  Allergen Reactions   Penicillins Hives    Has patient had a PCN reaction causing immediate rash, facial/tongue/throat swelling, SOB or lightheadedness with hypotension: yes Has patient had a PCN reaction causing severe rash involving mucus membranes or skin necrosis: no Has patient had a PCN reaction that required hospitalization: no Has patient had a PCN reaction occurring within the last 10 years: no If all of the above answers are "NO", then may proceed with Cephalosporin use.    I? Current Facility-Administered Medications  Medication Dose Route Frequency Provider Last Rate Last Admin   acetaminophen (TYLENOL) tablet 1,000 mg  1,000 mg Oral Q6H PRN Sharion Settler, NP       Or   acetaminophen (TYLENOL) suppository 650 mg  650 mg Rectal Q6H PRN Sharion Settler, NP       aspirin EC tablet 81 mg  81 mg Oral Daily Richarda Osmond, MD   81 mg at 11/14/21 0838   atorvastatin (LIPITOR) tablet 40 mg  40 mg Oral Daily Richarda Osmond, MD   40 mg at 11/14/21 0837   enoxaparin (LOVENOX) injection 40 mg  40 mg Subcutaneous Q24H Richarda Osmond, MD   40 mg at 11/14/21 1210   hydrALAZINE (APRESOLINE) tablet 25 mg  25 mg Oral Q6H PRN Nicole Kindred A, DO       insulin aspart (novoLOG) injection 0-15 Units  0-15 Units Subcutaneous TID WC Judd Gaudier V, MD   5 Units at 11/14/21 1210   insulin aspart (novoLOG) injection 3 Units  3 Units Subcutaneous TID WC Nicole Kindred A, DO   3 Units at 11/14/21 1211   insulin glargine-yfgn (SEMGLEE) injection 10 Units  10 Units Subcutaneous Daily  Richarda Osmond, MD   10 Units at 11/14/21 0838   lisinopril (ZESTRIL) tablet 40 mg  40 mg Oral Daily Richarda Osmond, MD   40 mg at 11/14/21 0837   ondansetron (ZOFRAN) tablet 4 mg  4 mg Oral Q6H PRN Athena Masse, MD       Or   ondansetron [facility]) injection 4 mg  4 mg Intravenous Q6H PRN Athena Masse, MD       oxyCODONE (Oxy IR/ROXICODONE) immediate release  tablet 5 mg  5 mg Oral Q6H PRN Sharion Settler, NP   5 mg at 11/14/21 0448   vancomycin (VANCOREADY) IVPB 1500 mg/300 mL  1,500 mg Intravenous Q12H Dallie Piles, RPH 150 mL/hr at 11/14/21 0631 1,500 mg at 11/14/21 0631     Abtx:  Anti-infectives (From admission, onward)    Start     Dose/Rate Route Frequency Ordered Stop   11/14/21 0600  vancomycin (VANCOREADY) IVPB 1500 mg/300 mL        1,500 mg 150 mL/hr over 120 Minutes Intravenous Every 12 hours 11/13/21 1446     11/11/21 2200  vancomycin (VANCOREADY) IVPB 750 mg/150 mL  Status:  Discontinued        750 mg 150 mL/hr over 60 Minutes Intravenous Every 8 hours 11/11/21 1228 11/13/21 1446   11/11/21 0900  vancomycin (VANCOREADY) IVPB 1250 mg/250 mL  Status:  Discontinued        1,250 mg 166.7 mL/hr over 90 Minutes Intravenous Every 12 hours 11/10/21 2201 11/11/21 1228   11/10/21 1830  vancomycin (VANCOREADY) IVPB 1250 mg/250 mL       See Hyperspace for full Linked Orders Report.   1,250 mg 166.7 mL/hr over 90 Minutes Intravenous  Once 11/10/21 1715 11/10/21 2027   11/10/21 1730  vancomycin (VANCOCIN) IVPB 1000 mg/200 mL premix       See Hyperspace for full Linked Orders Report.   1,000 mg 200 mL/hr over 60 Minutes Intravenous  Once 11/10/21 1715 11/10/21 2258   11/10/21 1715  cefTRIAXone (ROCEPHIN) 2 g in sodium chloride 0.9 % 100 mL IVPB        2 g 200 mL/hr over 30 Minutes Intravenous  Once 11/10/21 1706 11/10/21 1835       REVIEW OF SYSTEMS:  Const: negative fever, chills, negative weight loss Eyes: negative diplopia or visual changes, negative eye  pain ENT: negative coryza, negative sore throat Resp: negative cough, hemoptysis, dyspnea Cards: negative for chest pain, palpitations, lower extremity edema GU: negative for frequency, dysuria and hematuria GI: Negative for abdominal pain, diarrhea, bleeding, constipation Skin: negative for rash and pruritus Heme: negative for easy bruising and gum/nose bleeding MS: pain , swelling rt ankle Neurolo:negative for headaches, dizziness, vertigo, memory problems  Psych: negative for feelings of anxiety, depression  Endocrine:  diabetes Allergy/Immunology- penicillin - rash when 8 yrs od- says he has taken amoxicillin after that Objective:  VITALS:  BP 137/89 (BP Location: Right Arm)    Pulse 87    Temp 99.3 F (37.4 C)    Resp 16    Ht 6\' 2"  (1.88 m)    Wt 102.1 kg    SpO2 93%    BMI 28.90 kg/m  PHYSICAL EXAM:  General: Alert, cooperative, no distress, appears stated age.  Head: Normocephalic, without obvious abnormality, atraumatic. Eyes: Conjunctivae clear, anicteric sclerae. Pupils are equal ENT Nares normal. No drainage or sinus tenderness. Lips, mucosa, and tongue normal. No Thrush Neck: Supple, symmetrical, no adenopathy, thyroid: non tender no carotid bruit and no JVD. Back: No CVA tenderness. Lungs: Clear to auscultation bilaterally. No Wheezing or Rhonchi. No rales. Heart: Regular rate and rhythm, no murmur, rub or gallop. Abdomen: Soft, non-tender,not distended. Bowel sounds normal. No masses Extremities: rt foot surgical dressing not removed Pictures reviewed     Skin: No rashes or lesions. Or bruising Lymph: Cervical, supraclavicular normal. Neurologic: Grossly non-focal Pertinent Labs Lab Results CBC    Component Value Date/Time   WBC 6.6 11/14/2021 0149  RBC 5.42 11/14/2021 0149   HGB 13.6 11/14/2021 0149   HGB 18.6 (H) 06/26/2020 0955   HCT 42.0 11/14/2021 0149   HCT 49.8 08/09/2021 0801   PLT 260 11/14/2021 0149   PLT 280 06/26/2020 0955   MCV 77.5  (L) 11/14/2021 0149   MCV 89 06/26/2020 0955   MCH 25.1 (L) 11/14/2021 0149   MCHC 32.4 11/14/2021 0149   RDW 14.1 11/14/2021 0149   RDW 12.4 06/26/2020 0955   LYMPHSABS 1.1 11/10/2021 1534   LYMPHSABS 1.1 06/26/2020 0955   MONOABS 1.0 11/10/2021 1534   EOSABS 0.1 11/10/2021 1534   EOSABS 0.0 06/26/2020 0955   BASOSABS 0.0 11/10/2021 1534   BASOSABS 0.0 06/26/2020 0955    CMP Latest Ref Rng & Units 11/14/2021 11/13/2021 11/12/2021  Glucose 70 - 99 mg/dL 217(H) - -  BUN 8 - 23 mg/dL 21 - -  Creatinine 0.61 - 1.24 mg/dL 0.85 0.68 0.74  Sodium 135 - 145 mmol/L 134(L) - -  Potassium 3.5 - 5.1 mmol/L 3.9 - -  Chloride 98 - 111 mmol/L 102 - -  CO2 22 - 32 mmol/L 24 - -  Calcium 8.9 - 10.3 mg/dL 8.4(L) - -  Total Protein 6.5 - 8.1 g/dL - - -  Total Bilirubin 0.3 - 1.2 mg/dL - - -  Alkaline Phos 38 - 126 U/L - - -  AST 15 - 41 U/L - - -  ALT 0 - 44 U/L - - -      Microbiology: Recent Results (from the past 240 hour(s))  Culture, blood (routine x 2)     Status: None (Preliminary result)   Collection Time: 11/10/21  3:34 PM   Specimen: BLOOD  Result Value Ref Range Status   Specimen Description BLOOD RIGHT ANTECUBITAL  Final   Special Requests   Final    BOTTLES DRAWN AEROBIC AND ANAEROBIC Blood Culture adequate volume   Culture   Final    NO GROWTH 4 DAYS Performed at [facility], [address] [place], [place] [address]    Report Status PENDING  Incomplete  Culture, blood (routine x 2)     Status: None (Preliminary result)   Collection Time: 11/10/21  3:39 PM   Specimen: BLOOD  Result Value Ref Range Status   Specimen Description BLOOD BLOOD RIGHT FOREARM  Final   Special Requests   Final    BOTTLES DRAWN AEROBIC AND ANAEROBIC Blood Culture adequate volume   Culture   Final    NO GROWTH 4 DAYS Performed at [facility], [address] [place], [place] [address]    Report Status PENDING  Incomplete  Resp Panel by RT-PCR (Flu A&B, Covid)  Nasopharyngeal Swab     Status: None   Collection Time: 11/11/21  1:41 AM   Specimen: Nasopharyngeal Swab; Nasopharyngeal(NP) swabs in vial transport medium  Result Value Ref Range Status   SARS Coronavirus 2 by RT PCR NEGATIVE NEGATIVE Final    Comment: (NOTE) SARS-CoV-2 target nucleic acids are NOT DETECTED.  The SARS-CoV-2 RNA is generally detectable in upper respiratory specimens during the acute phase of infection. The lowest concentration of SARS-CoV-2 viral copies this assay can detect is 138 copies/mL. A negative result does not preclude SARS-Cov-2 infection and should not be used as the sole basis for treatment or other patient management decisions. A negative result may occur with  improper specimen collection/handling, submission of specimen other than nasopharyngeal swab, presence of viral mutation(s) within the areas targeted by  this assay, and inadequate number of viral copies(<138 copies/mL). A negative result must be combined with clinical observations, patient history, and epidemiological information. The expected result is Negative.  Fact Sheet for Patients:  EntrepreneurPulse.com.au  Fact Sheet for Healthcare Providers:  IncredibleEmployment.be  This test is no t yet approved or cleared by the [place] FDA and  has been authorized for detection and/or diagnosis of SARS-CoV-2 by FDA under an Emergency Use Authorization (EUA). This EUA will remain  in effect (meaning this test can be used) for the duration of the COVID-19 declaration under Section 564(b)(1) of the Act, 21 U.S.C.section 360bbb-3(b)(1), unless the authorization is terminated  or revoked sooner.       Influenza A by PCR NEGATIVE NEGATIVE Final   Influenza B by PCR NEGATIVE NEGATIVE Final    Comment: (NOTE) The Xpert Xpress SARS-CoV-2/FLU/RSV plus assay is intended as an aid in the diagnosis of influenza from Nasopharyngeal swab specimens and should not be  used as a sole basis for treatment. Nasal washings and aspirates are unacceptable for Xpert Xpress SARS-CoV-2/FLU/RSV testing.  Fact Sheet for Patients: EntrepreneurPulse.com.au  Fact Sheet for Healthcare Providers: IncredibleEmployment.be  This test is not yet approved or cleared by the [place] FDA and has been authorized for detection and/or diagnosis of SARS-CoV-2 by FDA under an Emergency Use Authorization (EUA). This EUA will remain in effect (meaning this test can be used) for the duration of the COVID-19 declaration under Section 564(b)(1) of the Act, 21 U.S.C. section 360bbb-3(b)(1), unless the authorization is terminated or revoked.  Performed at [facility], [address] [place], [place] [address]   Aerobic/Anaerobic Culture w Gram Stain (surgical/deep wound)     Status: None (Preliminary result)   Collection Time: 11/11/21  7:34 AM   Specimen: Abscess  Result Value Ref Range Status   Specimen Description   Final    ABSCESS Performed at [facility], [address], [place], [place] [address]    Special Requests   Final    NONE Performed at [facility], [address] [place], [place] [address]    Gram Stain   Final    FEW WBC PRESENT,BOTH PMN AND MONONUCLEAR NO ORGANISMS SEEN Performed at [facility], [place] [address] [place], [place] [address]    Culture RARE STAPHYLOCOCCUS LUGDUNENSIS  Final   Report Status PENDING  Incomplete   Organism ID, Bacteria STAPHYLOCOCCUS LUGDUNENSIS  Final      Susceptibility   Staphylococcus lugdunensis - MIC*    CIPROFLOXACIN <=0.5 SENSITIVE Sensitive     ERYTHROMYCIN >=8 RESISTANT Resistant     GENTAMICIN <=0.5 SENSITIVE Sensitive     OXACILLIN 2 SENSITIVE Sensitive     TETRACYCLINE <=1 SENSITIVE Sensitive     VANCOMYCIN <=0.5 SENSITIVE Sensitive     TRIMETH/SULFA <=10 SENSITIVE Sensitive     CLINDAMYCIN >=8 RESISTANT Resistant     RIFAMPIN  <=0.5 SENSITIVE Sensitive     Inducible Clindamycin NEGATIVE Sensitive     * RARE STAPHYLOCOCCUS LUGDUNENSIS  Aerobic/Anaerobic Culture w Gram Stain (surgical/deep wound)     Status: None (Preliminary result)   Collection Time: 11/13/21  4:58 PM   Specimen: PATH Soft tissue  Result Value Ref Range Status   Specimen Description   Final    TISSUE Performed at [facility], [address] [place],  [address]    Special Requests   Final    SOFT TISSUE Performed at [facility], [address],  [place], [place] [address]    Gram Stain   Final    MODERATE WBC PRESENT,BOTH PMN AND MONONUCLEAR NO ORGANISMS SEEN    Culture   Final    NO GROWTH < 12 HOURS Performed at [facility] [address] [place], [place] [address]    Report Status PENDING  Incomplete    IMAGING RESULTS:  I have personally reviewed the films ?MRI Arthrodesis of the first TMT joint. Healed calcaneal osteotomy with orthopedic hardware in place. Mild osteoarthritis of the talonavicular joint and calcaneocuboid joint.  Impression/Recommendation Rt Ankle stabilization surgery-- Rt ankle surgical site infection with abscess soft tissue and septic arthritis- s/p arthroscopy and debridement/washout Culture staph lugdunensis from the bedside culture- OR culture pending On vancomycin I discussed with Dr.Evans and the hardware in the calcaneum is from the previous reconstructive surgery in 2008 and that site is not infected or involved with current SSI. He plans to remove the sutures and wires fplaced during the surgery on 10/6  Pt will need minimum 4-6 weeks of IV antibiotic Will await all cultures  DM on insulin   HLD on atorvastatin  Hyogonadism on chronic testosterone  Erythrocytosis due to testosterone- has had multiple phlebotomies - followed by heme as Op     ? ? ___________________________________________________ Discussed with patient, and podiatrist Note:  This  document was prepared using Dragon voice recognition software and may include unintentional dictation errors.

## 2021-11-14 NOTE — Assessment & Plan Note (Signed)
In setting of right ankle stabilization surgery in October 2022.  Podiatry performed bedside I&D with packing of abscess on admission, 11/11/2021. -- Podiatry, Ortho, infectious disease consulted -- Continue IV vancomycin -- Monitor fever curve, CBC -- Plan for return to OR for further washout and debridement tomorrow -- Follow operative cultures

## 2021-11-14 NOTE — Progress Notes (Signed)
Progress Note    Jonathan Fields   HWE:[id number]  DOB: 03-05-1956  DOA: 11/10/2021     4 Date of Service: 11/14/2021   Brief narrative Hebert Dooling Wierzba is a 65 y.o. male with a PMH significant for type II DM, HTN, BPH, OSA, cavus deformity right foot s/p stabilization 89/3810 complicated by wound dehiscence and failed outpatient antibiotic treatment.  He presented from home to the ED on 11/10/2021 with right ankle pain redness, draining and swelling x several days. In the ED, it was found that they had cellulitis and abscess, started on IV antibiotics. Podiatry was consulted and performed bedside I&D for abscess.  MRI showed signs of osteomyelitis with septic arthritis.  Orthopedic surgery was also consulted.   Patient admitted to medicine service for further workup and management of right ankle septic arthritis as outlined in detail below.       Assessment and Plan * Postoperative cellulitis and abscess of surgical wound right ankle Assessment management as outlined, see acute osteomyelitis  Abscess of bursa, right ankle and foot Management per podiatry. Further I&D washout and debridement in the OR as planned tomorrow 12/16. N.p.o. after midnight Nonweightbearing on the right lower extremity  Septic arthritis of right ankle ([place]) On IV antibiotics as outlined. Follow cultures.  Acute osteomyelitis of right ankle ([place]) In setting of right ankle stabilization surgery in October 2022.  Podiatry performed bedside I&D with packing of abscess on admission, 11/11/2021. -- Podiatry, Ortho, infectious disease consulted -- Continue IV vancomycin -- Monitor fever curve, CBC -- Plan for return to OR for further washout and debridement tomorrow -- Follow operative cultures  Cavovarus deformity of foot, acquired, right Status post surgery for stabilization of the ankle over, now with complications as outlined.  Hyperglycemia due to type 2 diabetes mellitus ([place]) Diabetes  uncontrolled with A1c 9.5% (11/04/2021), up from 7.8% in May -- Continue sliding scale NovoLog -- Add NovoLog 3 units 3 times daily with meals -- Semglee 10 units daily -- Adjust insulin as needed for inpatient goal 140-180  Erythrocytosis Last therapeutic phlebotomy in October 2022.  RBC 6.0 on admission, hemoglobin 15.2, hematocrit 47.4.  In the setting of testosterone replacement therapy.  Status post phlebotomy on 12/12. -- Continue as needed therapeutic phlebotomy --low-dose aspirin -- Daily CBCs to monitor  Hypertension BP elevated on admission.  Continues to be elevated intermittently. -- Continue lisinopril 40 mg daily --As needed hydralazine --Consider addition of second agent if BP remains uncontrolled -- Monitor BP     Subjective:  Patient seen awake resting in bed today.  He reports feeling about the same, has ongoing right ankle pain.  Denies fevers or chills however.  No other acute complaints at this time.  States that he would very much prefer to have oral antibiotic course at that all possible.  Objective Vitals:   11/13/21 2027 11/14/21 0627 11/14/21 0752 11/14/21 1513  BP: 109/77 130/82 137/89 (!) 127/91  Pulse: 90 88 87 95  Resp: 20 18 16 16   Temp: 98.8 F (37.1 C) 99.1 F (37.3 C) 99.3 F (37.4 C) 98.4 F (36.9 C)  TempSrc: Oral Oral    SpO2: 95% 95% 93% 93%  Weight:      Height:       102.1 kg  Vital signs were reviewed and unremarkable.   Exam General exam: awake, alert, no acute distress HEENT: moist mucus membranes, hearing grossly normal  Respiratory system: CTAB, no wheezes, rales or rhonchi, normal respiratory effort. Cardiovascular system:  normal S1/S2, RRR Central nervous system: A&O x3. no gross focal neurologic deficits, normal speech Extremities: Right ankle with clean dry intact Ace wrap, no edema, normal tone Skin: dry, intact, normal temperature Psychiatry: normal mood, congruent affect, judgement and insight appear  normal   Labs / Other Information My review of labs, imaging, notes and other tests is significant for Sodium 134, glucose 217 with CBG's 174 and 207 at breakfast and lunchtime respectively, CBC stable    Right ankle fluid gram stain from 12/14 - MODERATE WBC PRESENT,BOTH PMN AND MONONUCLEAR  NO ORGANISMS SEEN.  Culture is pending   Disposition Plan: Status is: Inpatient  Remains inpatient appropriate because: Severity of illness, remains on IV antibiotics, further plan for I&D and debridement in the OR with podiatry.        Time spent: 25 minutes [facility] 11/14/2021, 4:41 PM

## 2021-11-14 NOTE — Progress Notes (Signed)
°  Subjective:  Patient ID: Jonathan Fields, male    DOB: February 01, 1956,  MRN: [id number] presents with septic ankle and ankle abscess to the right side.  Patient states he is doing well.  He still gets some pain.  Overall doing a little bit better.  They would like to know when the next surgical plan will be.  No nausea fever chills vomiting.  Bandages clean dry and intact [name]   Objective:   Vitals:   11/14/21 0627 11/14/21 0752  BP: 130/82 137/89  Pulse: 88 87  Resp: 18 16  Temp: 99.1 F (37.3 C) 99.3 F (37.4 C)  SpO2: 95% 93%   General AA&O x3. Normal mood and affect.  Vascular Dorsalis pedis and posterior tibial pulses 2/4 bilat. Brisk capillary refill to all digits. Pedal hair present.  Neurologic Epicritic sensation grossly intact.  Dermatologic Packing was removed.  No further purulent drainage noted upon mechanical decompression at bedside.  Sanguinous fluid noted.  No malodor present.  Orthopedic: MMT 5/5 in dorsiflexion, plantarflexion, inversion, and eversion. Normal joint ROM without pain or crepitus.    Assessment & Plan:  Patient was evaluated and treated and all questions answered.  Right ankle abscess/septic arthritis status post ankle arthroscopy with incision and drainage of the abscess of the right ankle -All questions and concerns were discussed with the patient in extensive detail -Plan on returning to the OR with Dr. Amalia Hailey tomorrow for revision of incision drainage washout debridement. -N.p.o. after midnight -Nonweightbearing to the right lower extremity -Packing was changed at bedside by me today.  No dressing change until tomorrow.  Felipa Furnace, DPM  Accessible via secure chat for questions or concerns.

## 2021-11-14 NOTE — Progress Notes (Signed)
Inpatient Diabetes Program Recommendations  AACE/ADA: New Consensus Statement on Inpatient Glycemic Control  Target Ranges:  Prepandial:   less than 140 mg/dL      Peak postprandial:   less than 180 mg/dL (1-2 hours)      Critically ill patients:  140 - 180 mg/dL    Latest Reference Range & Units 11/14/21 01:49  Glucose 70 - 99 mg/dL 217 (H)    Latest Reference Range & Units 11/13/21 07:22 11/13/21 11:40 11/13/21 15:19 11/13/21 17:49 11/13/21 21:00  Glucose-Capillary 70 - 99 mg/dL 177 (H) 137 (H) 109 (H) 111 (H) 283 (H)   Review of Glycemic Control   Admit with: Postoperative cellulitis and abscess of surgical wound right ankle s/p right lateral ankle stabilization procedure on 10/03/1623, complicated by 2 postoperative dehiscence    History: DM2   Home DM Meds: Metformin 1000 mg BID                             Farxiga 5 mg Daily (Hasn't started taking yet--new Rx just filled last week)   Current Orders: Semglee 10 units daily                           Novolog 0-15 units TID   Inpatient Diabetes Program Recommendations:     Insulin: Please consider ordering Novolog 2-3 units TID with meals for meal coverage if patient eats at least 50% of meals.   NOTE: Diet resumed 11/13/21 at 18:58. Glucose up to 283 mg/dl last night after eating.   Thanks, Barnie Alderman, RN, MSN, CDE Diabetes Coordinator Inpatient Diabetes Program [phone] (Team Pager from 8am to 5pm)

## 2021-11-14 NOTE — Assessment & Plan Note (Signed)
Diabetes uncontrolled with A1c 9.5% (11/04/2021), up from 7.8% in May -- Continue sliding scale NovoLog -- Add NovoLog 3 units 3 times daily with meals -- Semglee 10 units daily -- Adjust insulin as needed for inpatient goal 140-180

## 2021-11-14 NOTE — Plan of Care (Signed)
°  Problem: Education: Goal: Knowledge of General Education information will improve Description: Including pain rating scale, medication(s)/side effects and non-pharmacologic comfort measures Outcome: Progressing   Problem: Health Behavior/Discharge Planning: Goal: Ability to manage health-related needs will improve Outcome: Progressing   Problem: Clinical Measurements: Goal: Ability to maintain clinical measurements within normal limits will improve Outcome: Progressing Goal: Will remain free from infection Outcome: Progressing Goal: Diagnostic test results will improve Outcome: Progressing Goal: Respiratory complications will improve Outcome: Progressing Goal: Cardiovascular complication will be avoided Outcome: Progressing   Problem: Activity: Goal: Risk for activity intolerance will decrease Outcome: Progressing   Problem: Nutrition: Goal: Adequate nutrition will be maintained Outcome: Progressing   Problem: Coping: Goal: Level of anxiety will decrease Outcome: Progressing   Problem: Elimination: Goal: Will not experience complications related to bowel motility Outcome: Progressing Goal: Will not experience complications related to urinary retention Outcome: Progressing   Problem: Pain Managment: Goal: General experience of comfort will improve Outcome: Progressing   Problem: Safety: Goal: Ability to remain free from injury will improve Outcome: Progressing   Problem: Skin Integrity: Goal: Risk for impaired skin integrity will decrease Outcome: Progressing   Problem: Clinical Measurements: Goal: Ability to avoid or minimize complications of infection will improve Outcome: Progressing   Problem: Skin Integrity: Goal: Skin integrity will improve Outcome: Progressing   Problem: Education: Goal: Required Educational Video(s) Outcome: Progressing   Problem: Clinical Measurements: Goal: Postoperative complications will be avoided or minimized Outcome:  Progressing   Problem: Skin Integrity: Goal: Demonstration of wound healing without infection will improve Outcome: Progressing

## 2021-11-15 LAB — CREATININE, SERUM
Creatinine, Ser: 0.71 mg/dL (ref 0.61–1.24)
GFR, Estimated: >60 mL/min (ref >60)

## 2021-11-15 LAB — GLUCOSE, CAPILLARY
Glucose-Capillary: 176 mg/dL — ABNORMAL HIGH (ref 70–99)
Glucose-Capillary: 196 mg/dL — ABNORMAL HIGH (ref 70–99)
Glucose-Capillary: 163 mg/dL — ABNORMAL HIGH (ref 70–99)
Glucose-Capillary: 219 mg/dL — ABNORMAL HIGH (ref 70–99)

## 2021-11-15 LAB — SEDIMENTATION RATE: Sed Rate: 68 mm/hr — ABNORMAL HIGH (ref 0–20)

## 2021-11-15 MED ORDER — POLYETHYLENE GLYCOL 3350 17 G PO PACK
17.0000 g | PACK | Freq: Every day | ORAL | Status: DC
Start: 2021-11-15 — End: 2021-11-18
  Administered 2021-11-15 – 2021-11-18 (×3): 17 g via ORAL
  Filled 2021-11-15 (×4): qty 1

## 2021-11-15 MED ORDER — HYDROMORPHONE HCL 1 MG/ML IJ SOLN
2.0000 mg | INTRAMUSCULAR | Status: DC | PRN
  Administered 2021-11-15: 2 mg via INTRAVENOUS
  Filled 2021-11-15: qty 2

## 2021-11-15 MED ORDER — OXYCODONE HCL 5 MG PO TABS
10.0000 mg | ORAL_TABLET | ORAL | Status: DC | PRN
  Administered 2021-11-15 – 2021-11-18 (×10): 10 mg via ORAL
  Filled 2021-11-15 (×10): qty 2

## 2021-11-15 MED ORDER — ACETAMINOPHEN 650 MG RE SUPP
650.0000 mg | Freq: Three times a day (TID) | RECTAL | Status: DC
  Filled 2021-11-15 (×2): qty 1

## 2021-11-15 MED ORDER — SENNOSIDES-DOCUSATE SODIUM 8.6-50 MG PO TABS
1.0000 | ORAL_TABLET | Freq: Every day | ORAL | Status: DC
  Administered 2021-11-16 – 2021-11-18 (×4): 1 via ORAL
  Filled 2021-11-15 (×4): qty 1

## 2021-11-15 MED ORDER — ACETAMINOPHEN 500 MG PO TABS
1000.0000 mg | ORAL_TABLET | Freq: Three times a day (TID) | ORAL | Status: DC
  Administered 2021-11-15 – 2021-11-19 (×12): 1000 mg via ORAL
  Filled 2021-11-15 (×12): qty 2

## 2021-11-15 MED ORDER — CEFAZOLIN SODIUM-DEXTROSE 2-4 GM/100ML-% IV SOLN
2.0000 g | Freq: Three times a day (TID) | INTRAVENOUS | Status: DC
  Administered 2021-11-15 – 2021-11-19 (×12): 2 g via INTRAVENOUS
  Filled 2021-11-15 (×13): qty 100

## 2021-11-15 MED ORDER — HYDROMORPHONE HCL 1 MG/ML IJ SOLN
2.0000 mg | INTRAMUSCULAR | Status: DC | PRN
  Administered 2021-11-16 – 2021-11-18 (×8): 2 mg via INTRAVENOUS
  Filled 2021-11-15 (×8): qty 2

## 2021-11-15 NOTE — Assessment & Plan Note (Signed)
BP elevated on admission.  Continues to be elevated intermittently. -- Continue lisinopril 40 mg daily --As needed hydralazine --Consider addition of second agent if BP remains uncontrolled -- Monitor BP

## 2021-11-15 NOTE — Assessment & Plan Note (Signed)
Management per podiatry. Further I&D washout and debridement in the OR as planned tomorrow 12/17. N.p.o. after midnight Nonweightbearing on the right lower extremity

## 2021-11-15 NOTE — Progress Notes (Signed)
Date of Admission:  11/10/2021     ID: Jonathan Fields is a 65 y.o. male  Principal Problem:   Postoperative cellulitis and abscess of surgical wound right ankle Active Problems:   Hypertension   Cavovarus deformity of foot, acquired, right   Erythrocytosis   Hyperglycemia due to type 2 diabetes mellitus (HCC)   Acute osteomyelitis of right ankle (HCC)   Septic arthritis of right ankle (HCC)   Synovitis of right ankle   Abscess of bursa, right ankle and foot    Subjective: Pt doing well Waiting for surgery tomorrow No fever Minimal pain  Medications:   acetaminophen  1,000 mg Oral Q8H   Or   acetaminophen  650 mg Rectal Q8H   aspirin EC  81 mg Oral Daily   atorvastatin  40 mg Oral Daily   enoxaparin (LOVENOX) injection  40 mg Subcutaneous Q24H   insulin aspart  0-15 Units Subcutaneous TID WC   insulin aspart  3 Units Subcutaneous TID WC   insulin glargine-yfgn  10 Units Subcutaneous Daily   lisinopril  40 mg Oral Daily   polyethylene glycol  17 g Oral Daily   senna-docusate  1 tablet Oral QHS    Objective: Vital signs in last 24 hours: Temp:  [98.3 F (36.8 C)-98.7 F (37.1 C)] 98.6 F (37 C) (12/16 0832) Pulse Rate:  [80-92] 80 (12/16 1629) Resp:  [16-20] 18 (12/16 1629) BP: (118-153)/(85-90) 153/88 (12/16 1629) SpO2:  [94 %-100 %] 100 % (12/16 1629)  PHYSICAL EXAM:  General: Alert, cooperative, no distress, appears stated age.  Head: Normocephalic, without obvious abnormality, atraumatic. Eyes: Conjunctivae clear, anicteric sclerae. Pupils are equal ENT Nares normal. No drainage or sinus tenderness. Lips, mucosa, and tongue normal. No Thrush Neck: Supple, symmetrical, no adenopathy, thyroid: non tender no carotid bruit and no JVD. Back: No CVA tenderness. Lungs: Clear to auscultation bilaterally. No Wheezing or Rhonchi. No rales. Heart: Regular rate and rhythm, no murmur, rub or gallop. Abdomen: Soft, non-tender,not distended. Bowel sounds normal. No  masses Extremities: rt foot dressing Skin: No rashes or lesions. Or bruising Lymph: Cervical, supraclavicular normal. Neurologic: Grossly non-focal  Lab Results Recent Labs    11/13/21 0232 11/14/21 0149 11/15/21 0457  WBC 6.7 6.6  --   HGB 13.7 13.6  --   HCT 42.5 42.0  --   NA  --  134*  --   K  --  3.9  --   CL  --  102  --   CO2  --  24  --   BUN  --  21  --   CREATININE 0.68 0.85 0.71   Liver Panel No results for input(s): PROT, ALBUMIN, AST, ALT, ALKPHOS, BILITOT, BILIDIR, IBILI in the last 72 hours. Sedimentation Rate Recent Labs    11/15/21 0457  ESRSEDRATE 68*    Microbiology: Staph lugdunensis     Assessment/Plan: Rt Ankle stabilization surgery-- Rt ankle surgical site infection with abscess soft tissue and septic arthritis- s/p arthroscopy and debridement/washout Culture staph lugdunensis fromboth bedside culture- and OR culture On vancomycin- change to cefazolin I discussed with Dr.Evans and the hardware in the calcaneum is from the previous reconstructive surgery in 2008 and that site is not infected or involved with current SSI. He plans to remove the sutures and wires placed during the surgery on 10/6  Pt will need minimum 4-6 weeks of IV antibiotic Will await all cultures   DM on insulin    HLD on atorvastatin  Hyogonadism on chronic testosterone   Erythrocytosis due to testosterone- has had multiple phlebotomies - followed by heme as Op  Discussed the management with the patient- ID will follow him peripherally this weekend Call if needed

## 2021-11-15 NOTE — Assessment & Plan Note (Signed)
Diabetes uncontrolled with A1c 9.5% (11/04/2021), up from 7.8% in May -- Continue sliding scale NovoLog -- Add NovoLog 3 units 3 times daily with meals -- Semglee 10 units daily -- Adjust insulin as needed for inpatient goal 140-180

## 2021-11-15 NOTE — Progress Notes (Signed)
Progress Note    Jonathan Fields   WUJ:[id number]  DOB: 1956/02/27  DOA: 11/10/2021     5 Date of Service: 11/15/2021   Brief Narrative Jonathan Fields is a 65 y.o. male with a PMH significant for type II DM, HTN, BPH, OSA, cavus deformity right foot s/p stabilization 95/6213 complicated by wound dehiscence and failed outpatient antibiotic treatment.  He presented from home to the ED on 11/10/2021 with right ankle pain redness, draining and swelling x several days. In the ED, it was found that they had cellulitis and abscess, started on IV antibiotics. Podiatry was consulted and performed bedside I&D for abscess.  MRI showed signs of osteomyelitis with septic arthritis.  Orthopedic surgery was also consulted.   Patient admitted to medicine service for further workup and management of right ankle septic arthritis as outlined in detail below.      Assessment and Plan * Postoperative cellulitis and abscess of surgical wound right ankle Assessment management as outlined, see acute osteomyelitis  Abscess of bursa, right ankle and foot Management per podiatry. Further I&D washout and debridement in the OR as planned tomorrow 12/17. N.p.o. after midnight Nonweightbearing on the right lower extremity  Septic arthritis of right ankle ([place]) On IV antibiotics as outlined. Follow cultures.  Acute osteomyelitis of right ankle ([name]) In setting of right ankle stabilization surgery in October 2022.  Podiatry performed bedside I&D with packing of abscess on admission, 11/11/2021. Bedside I&D culture: +Staph lugdunensis -- Podiatry, Ortho, infectious disease consulted -- Continue IV vancomycin -- Monitor fever curve, CBC -- Plan for return to OR for further washout and debridement tomorrow -- Follow OR cultures --Anticipate need for 4-6 weeks IV antibiotics --Scheduled Tylenol and PRN oxycodone for pain, IV dilaudid for breakthrough pain --Bowel regimen while on pain meds  Cavovarus  deformity of foot, acquired, right Status post surgery for stabilization of the ankle over, now with complications as outlined.  Hyperglycemia due to type 2 diabetes mellitus ([place]) Diabetes uncontrolled with A1c 9.5% (11/04/2021), up from 7.8% in May -- Continue sliding scale NovoLog -- Add NovoLog 3 units 3 times daily with meals -- Semglee 10 units daily -- Adjust insulin as needed for inpatient goal 140-180  Erythrocytosis Last therapeutic phlebotomy in October 2022.  RBC 6.0 on admission, hemoglobin 15.2, hematocrit 47.4.  In the setting of testosterone replacement therapy.  Status post phlebotomy on 12/12. -- Continue as needed therapeutic phlebotomy --low-dose aspirin -- Daily CBCs to monitor  Hypertension BP elevated on admission.  Continues to be elevated intermittently. -- Continue lisinopril 40 mg daily --As needed hydralazine --Consider addition of second agent if BP remains uncontrolled -- Monitor BP     Subjective:  Patient awake resting in bed when seen.  He reports 5 out of 10 pain of the right ankle.  Pain has been persistent and quite severe at times.  He asks about mobility, typically an active person and has not really moved from the bed.  No fevers chills or other acute complaints at this time.  Objective Vitals:   11/14/21 1513 11/14/21 2000 11/15/21 0443 11/15/21 0832  BP: (!) 127/91 138/90 131/85 118/90  Pulse: 95 92 83 86  Resp: 16 16 20 18   Temp: 98.4 F (36.9 C) 98.7 F (37.1 C) 98.3 F (36.8 C) 98.6 F (37 C)  TempSrc:  Oral Oral Oral  SpO2: 93% 94% 94% 95%  Weight:      Height:       102.1 kg  Vital  signs were reviewed and unremarkable.   Exam General exam: awake, alert, no acute distress HEENT: atraumatic, clear conjunctiva, anicteric sclera, moist mucus membranes, hearing grossly normal  Respiratory system: normal respiratory effort, on room air. Cardiovascular system:RRR, no pedal edema.   Central nervous system: A&O x4. no gross  focal neurologic deficits, normal speech Extremities: Right ankle with clean dry intact dressing in place with intact distal sensation and normal temperature distally, no edema, normal tone Skin: dry, intact, no rashes, lesions or ulcers seen on visualized skin Psychiatry: normal mood, congruent affect, judgement and insight appear normal   Labs / Other Information There are no new results to review at this time.   Disposition Plan: Status is: Inpatient  Remains inpatient appropriate because: Severity of illness on IV antibiotics, pending further debridement in the OR with podiatry.        Time spent: 25 minutes [facility] 11/15/2021, 3:01 PM

## 2021-11-15 NOTE — Assessment & Plan Note (Addendum)
In setting of right ankle stabilization surgery in October 2022.  Podiatry performed bedside I&D with packing of abscess on admission, 11/11/2021. Bedside I&D culture: +Staph lugdunensis -- Podiatry, Ortho, infectious disease consulted -- Continue IV vancomycin -- Monitor fever curve, CBC -- Plan for return to OR for further washout and debridement tomorrow -- Follow OR cultures --Anticipate need for 4-6 weeks IV antibiotics --Scheduled Tylenol and PRN oxycodone for pain, IV dilaudid for breakthrough pain --Bowel regimen while on pain meds

## 2021-11-15 NOTE — Consult Note (Signed)
Pharmacy Antibiotic Note  Jonathan Fields is a 65 y.o. male w/ PMH DM, HTN, BPH admitted on 11/10/2021 with cavus deformity right foot s/p stabilization 38/3291 complicated by wound dehiscence and failed outpatient antibiotic treatment. Pharmacy has been consulted for vancomycin dosing. Renal function has been stable since admission.  Plan: continue vancomycin dose to 1500 mg IV every 12  hours Goal AUC 400-550  Est AUC: 449.6 Ke: 0.091 h-1, T1/2: 7.6 h Daily renal function assessment while on IV vancomycin Levels following next scheduled dose   Height: 6\' 2"  (188 cm) Weight: 102.1 kg (225 lb 1.4 oz) IBW/kg (Calculated) : 82.2  Temp (24hrs), Avg:98.7 F (37.1 C), Min:98.3 F (36.8 C), Max:99.3 F (37.4 C)  Recent Labs  Lab 11/10/21 1534 11/10/21 1535 11/10/21 2124 11/11/21 0427 11/11/21 0431 11/12/21 0242 11/13/21 0232 11/14/21 0149 11/15/21 0457  WBC 8.3  --   --   --  6.6 7.7 6.7 6.6  --   CREATININE  --   --   --  0.80  --  0.74 0.68 0.85 0.71  LATICACIDVEN  --  2.1* 1.4  --   --   --   --   --   --      Estimated Creatinine Clearance: 114.3 mL/min (by C-G formula based on SCr of 0.71 mg/dL).    Allergies  Allergen Reactions   Penicillins Hives    Has patient had a PCN reaction causing immediate rash, facial/tongue/throat swelling, SOB or lightheadedness with hypotension: yes Has patient had a PCN reaction causing severe rash involving mucus membranes or skin necrosis: no Has patient had a PCN reaction that required hospitalization: no Has patient had a PCN reaction occurring within the last 10 years: no If all of the above answers are "NO", then may proceed with Cephalosporin use.     Antimicrobials this admission: 12/11 ceftriaxone >> 12/12 12/11 vancomycin >>   Microbiology results: 12/11 BCx: NG x 4 days 12/12 WCx: rare S lugdunensis  12/14 WCx: rare S lugdunensis  12/11 SARS CoV-2: negative 12/11 influenza A/B: negative  Thank you for allowing  pharmacy to be a part of this patients care.  Dallie Piles, PharmD 11/15/2021 6:56 AM

## 2021-11-15 NOTE — Assessment & Plan Note (Signed)
Last therapeutic phlebotomy in October 2022.  RBC 6.0 on admission, hemoglobin 15.2, hematocrit 47.4.  In the setting of testosterone replacement therapy.  Status post phlebotomy on 12/12. -- Continue as needed therapeutic phlebotomy --low-dose aspirin -- Daily CBCs to monitor

## 2021-11-15 NOTE — Assessment & Plan Note (Signed)
Assessment management as outlined, see acute osteomyelitis

## 2021-11-15 NOTE — Evaluation (Signed)
Physical Therapy Evaluation Patient Details Name: Jonathan Fields MRN: [id number] DOB: Jun 06, 1956 Today's Date: 11/15/2021  History of Present Illness  pt is a 65 y.o. male that presented to ED for continued pain and tenderness of R ankle.  RT ankle incision and drainage 11/13/2021 secondary to septic joint from complication of lateral ankle stabilization procedure 09/05/2021. Scheduled for I&D 12/17.  PMH of TB, DM, GERD, HLD, HTN, sleep apnea.   Clinical Impression  PT A&Ox4, reported some mild R ankle pain with mobility today. Pt reported at baseline he is independent, is working, driving, etc. No falls in the last 6 months.  The patient was provided with several HEP packets upon request, PT reviewed frequency and graded return to activity with patient. Pt also educated about current NWB status on RLE. Bed mobility performed modI, and transfers with axillary crutches, and also RW performed, supervision. He was able to perform two bouts of hop to ambulation, one bout with crutches, one bout with RW. Pt had a few mild LOB posteriorly with crutches (difficult environment to navigate) but no physical assistance needed. Returned to supine with all needs in reach.  Overall the patient demonstrated deficits (see "PT Problem List") that impede the patient's functional abilities, safety, and mobility and would benefit from skilled PT intervention. Recommendation at this time is outpatient PT for continued education on return to PLOF.         Recommendations for follow up therapy are one component of a multi-disciplinary discharge planning process, led by the attending physician.  Recommendations may be updated based on patient status, additional functional criteria and insurance authorization.  Follow Up Recommendations Outpatient PT    Assistance Recommended at Discharge Intermittent Supervision/Assistance  Functional Status Assessment Patient has had a recent decline in their functional status and  demonstrates the ability to make significant improvements in function in a reasonable and predictable amount of time.  Equipment Recommendations  Crutches (extra tall crutches)    Recommendations for Other Services       Precautions / Restrictions Precautions Precautions: Fall Restrictions Weight Bearing Restrictions: Yes RLE Weight Bearing: Non weight bearing      Mobility  Bed Mobility Overal bed mobility: Independent                  Transfers Overall transfer level: Modified independent Equipment used: Rolling walker (2 wheels);Crutches                    Ambulation/Gait   Gait Distance (Feet):  (2 bouts of 20 ft) Assistive device: Rolling walker (2 wheels);Crutches         General Gait Details: hop to pattern, great adherence to NWB precautions. some mild posterior LOB noted with crutches but no physical assist needed to correct  Stairs            Wheelchair Mobility    Modified Rankin (Stroke Patients Only)       Balance Overall balance assessment: Needs assistance Sitting-balance support: Feet supported Sitting balance-Leahy Scale: Normal       Standing balance-Leahy Scale: Good Standing balance comment: reliant on UE support for any dynamic activties/ambulation                             Pertinent Vitals/Pain Pain Assessment: Faces Faces Pain Scale: Hurts a little bit Pain Location: with mobility of R ankle Pain Intervention(s): Limited activity within patient's tolerance;Monitored during session;Repositioned    Home  Living Family/patient expects to be discharged to:: Private residence Living Arrangements: Spouse/significant other Available Help at Discharge: Family;Available 24 hours/day Type of Home: House Home Access: Stairs to enter Entrance Stairs-Rails: Right Entrance Stairs-Number of Steps: 3   Home Layout: Two level;Able to live on main level with bedroom/bathroom Home Equipment: None Additional  Comments: works for [employer], very motivated, likes to work out    Prior Function Prior Level of Function : Independent/Modified Independent                     Journalist, newspaper        Extremity/Trunk Assessment   Upper Extremity Assessment Upper Extremity Assessment: Overall WFL for tasks assessed    Lower Extremity Assessment Lower Extremity Assessment: Overall WFL for tasks assessed (R ankle MMT deferred)    Cervical / Trunk Assessment Cervical / Trunk Assessment: Normal  Communication   Communication: No difficulties  Cognition Arousal/Alertness: Awake/alert Behavior During Therapy: WFL for tasks assessed/performed Overall Cognitive Status: Within Functional Limits for tasks assessed                                          General Comments      Exercises Other Exercises Other Exercises: Pt given HEP for supine, seated, and standing exercises. educated on importance of up out of bed while in the hospital, at least 3x a day. educated on performing ankle exercises as able as well   Assessment/Plan    PT Assessment Patient needs continued PT services  PT Problem List Decreased strength;Decreased mobility;Decreased balance;Decreased knowledge of precautions;Decreased knowledge of use of DME;Pain       PT Treatment Interventions DME instruction;Therapeutic exercise;Gait training;Balance training;Stair training;Neuromuscular re-education;Functional mobility training;Therapeutic activities;Patient/family education    PT Goals (Current goals can be found in the Care Plan section)  Acute Rehab PT Goals Patient Stated Goal: to get back to PLOF PT Goal Formulation: With patient Time For Goal Achievement: 11/29/21 Potential to Achieve Goals: Good    Frequency Min 2X/week   Barriers to discharge        Co-evaluation               AM-PAC PT "6 Clicks" Mobility  Outcome Measure Help needed turning from your  back to your side while in a flat bed without using bedrails?: None Help needed moving from lying on your back to sitting on the side of a flat bed without using bedrails?: None Help needed moving to and from a bed to a chair (including a wheelchair)?: None Help needed standing up from a chair using your arms (e.g., wheelchair or bedside chair)?: None Help needed to walk in hospital room?: A Little Help needed climbing 3-5 steps with a railing? : A Little 6 Click Score: 22    End of Session   Activity Tolerance: Patient tolerated treatment well Patient left: in bed;with call bell/phone within reach Nurse Communication: Mobility status PT Visit Diagnosis: Other abnormalities of gait and mobility (R26.89);Difficulty in walking, not elsewhere classified (R26.2);Muscle weakness (generalized) (M62.81);Pain Pain - Right/Left: Right Pain - part of body: Ankle and joints of foot    Time: 1340-1410 PT Time Calculation (min) (ACUTE ONLY): 30 min   Charges:   PT Evaluation $PT Eval Low Complexity: 1 Low PT Treatments $Gait Training: 8-22 mins $Therapeutic Exercise: 8-22 mins       Beverlee Nims  Megan Salon PT, DPT 2:53 PM,11/15/21

## 2021-11-15 NOTE — Assessment & Plan Note (Signed)
Status post surgery for stabilization of the ankle over, now with complications as outlined.

## 2021-11-15 NOTE — Assessment & Plan Note (Signed)
On IV antibiotics as outlined. Follow cultures.

## 2021-11-15 NOTE — Consult Note (Signed)
PODIATRY CONSULTATION  NAME Jonathan Fields MRN [id number] DOB 19-Mar-1956 DOA 11/10/2021   Reason for consult: Septic arthritis RT ankle Chief Complaint  Patient presents with   Foot Pain    History of present illness: 65 y.o. male POV RT ankle incision and drainage 11/13/2021 secondary to septic joint from complication of lateral ankle stabilization procedure 09/05/2021 presenting at bedside this morning.  Patient continues to have some pain and tenderness to the right ankle.  Infectious disease consulted yesterday.  Currently on vancomycin 1500 mg Q12H. patient presenting at bedside this morning with his spouse on the phone.  Past Medical History:  Diagnosis Date   BPH (benign prostatic hyperplasia)    Bronchitis    Complication of anesthesia    hard to wake up   DM (diabetes mellitus), type 2 ([name])    DVT (deep venous thrombosis) ([name])    post op   ED (erectile dysfunction)    Erythrocytosis 07/04/2020   GERD (gastroesophageal reflux disease)    Hyperlipemia    Hypertension    Hypogonadism in male    Nocturia    Right ankle instability    Sleep apnea    TB lung, latent    Urinary frequency    Past Surgical History:  Procedure Laterality Date   ANKLE ARTHROSCOPY Right 11/13/2021   Procedure: ANKLE ARTHROSCOPY Arthroscopic Irrigation;  Surgeon: [name], DPM;  Location: [facility] ORS;  Service: Podiatry;  Laterality: Right;   ANKLE SURGERY Left    torn ligament   CARPAL TUNNEL RELEASE Right 08/2019   COLONOSCOPY  2009   IRRIGATION AND DEBRIDEMENT ABSCESS Right 11/13/2021   Procedure: IRRIGATION AND DEBRIDEMENT ABSCESS;  Surgeon: [name], DPM;  Location: [facility] ORS;  Service: Podiatry;  Laterality: Right;   KNEE ARTHROSCOPY Left    right ankle ruptured tendon surgery  2008   TRANSURETHRAL RESECTION OF PROSTATE N/A 02/19/2018   Procedure: TRANSURETHRAL RESECTION OF THE PROSTATE (TURP);  Surgeon: [name], MD;  Location: [facility] ORS;  Service: Urology;   Laterality: N/A;   CBC Latest Ref Rng & Units 11/14/2021 11/13/2021 11/12/2021  WBC 4.0 - 10.5 K/uL 6.6 6.7 7.7  Hemoglobin 13.0 - 17.0 g/dL 13.6 13.7 13.3  Hematocrit 39.0 - 52.0 % 42.0 42.5 41.4  Platelets 150 - 400 K/uL 260 226 229   BMP Latest Ref Rng & Units 11/15/2021 11/14/2021 11/13/2021  Glucose 70 - 99 mg/dL - 217(H) -  BUN 8 - 23 mg/dL - 21 -  Creatinine 0.61 - 1.24 mg/dL 0.71 0.85 0.68  BUN/Creat Ratio 10 - 24 - - -  Sodium 135 - 145 mmol/L - 134(L) -  Potassium 3.5 - 5.1 mmol/L - 3.9 -  Chloride 98 - 111 mmol/L - 102 -  CO2 22 - 32 mmol/L - 24 -  Calcium 8.9 - 10.3 mg/dL - 8.4(L) -      Physical Exam: General: The patient is alert and oriented x3 in no acute distress.  Resting comfortably  Dermatology: Small skin incisions noted to the anterior ankle x2 from incision and drainage 11/13/2021.  Incisions are intact with no drainage to the small areas Open lateral ankle wound overlying the fibular malleolus with packing intact.  There continues to be seropurulent drainage.  No strong malodor.  Maceration around the incision site  Vascular: Palpable pedal pulses bilaterally. Capillary refill within normal limits.  There continues to be moderate edema and erythema encompassing the right ankle however this does appear to be somewhat improved  over the last few days since incision and drainage.  Neurological: Light touch and protective threshold intact  Musculoskeletal Exam: Cavovarus foot deformity right lower extremity  MR ANKLE RT W WO CONTRAST 11/10/2021 IMPRESSION 1. Moderate ankle joint effusion. Peripherally enhancing 3.6 x 2.1 x 4 cm fluid collection in the soft tissues along the anterolateral aspect of the ankle joint abutting the lateral malleolus and contiguous with the tibiotalar joint space most concerning for an abscess and septic arthritis. Complex fluid collection abuts the lateral malleolus with subcortical bone marrow edema concerning  for osteomyelitis. 2. Generalized soft tissue edema around the ankle and dorsal foot concerning for cellulitis.  CT ANKLE RT WO CONTRAST 11/10/2021 IMPRESSION: 1. Peripherally enhancing fluid collection in the subcutaneous soft tissues on the lateral aspect of the ankle measuring at least 5.6 x 2.9 x 3.7 cm. Generalized subcutaneous soft tissue edema about the ankle, concerning for cellulitis. 2. Postsurgical changes about the calcaneus without evidence of acute fracture. 3.  Arthropathy of the tibiotalar and talonavicular joints.  XR ANKLE RT 11/10/2021 IMPRESSION: Soft tissue swelling is noted overlying lateral malleolus. No acute bony abnormality is noted.  XR FOOT RT 11/10/2021 FINDINGS: There is no evidence of fracture or dislocation. Postsurgical changes are seen involving the calcaneus and first metatarsal. Soft tissues are unremarkable.   IMPRESSION: No acute abnormality seen.  ASSESSMENT/PLAN OF CARE 1.  Septic arthritis RT ankle -Planning for return to the OR for repeat irrigation and debridement.  I contacted the OR yesterday for availability today however there booked all the way into light this evening.  Surgery planned for tomorrow, 11/16/2021, 8 AM.  Preoperative orders placed.  Planning for irrigation and debridement with removal of the mostly all suture internal brace to the lateral right ankle.  The patient also has hardware in the calcaneus and first metatarsal from prior reconstruction surgery in 2008.  This currently does not connect with the current septic arthritis and infection we will plan to leave his hardware intact.  -No hardware was implanted with the most recent surgery on 09/05/2021.  This was an all suture internal brace with small plastic anchors in the talus and fibula designed to create some stability of the ankle joint and prevent rolling the ankle.  Will plan to remove this internal bracing system with tomorrow's irrigation debridement. -Today had  a long discussion with the patient regarding the severity of his infection to the right ankle complicated by the septic arthritis and his diabetes.  Patient states that his diabetes over the past year has been better controlled.  Prior to admission his last A1c 04/21/2021 was 7.8.  Prior to that on 09/28/2020 it was 6.8.  Upon admission 11/04/2021 his A1c was 9.5.  Stressed importance of maintaining blood glucose levels and control his diabetes which is vitally healing.  Patient understands -Continue IV vancomycin 50 mg every 12 hours.  Infectious disease recommendations and management is always greatly appreciated -N.p.o. after midnight -Continue nonweightbearing right lower extremity -Podiatry will continue to closely follow   Edrick Kins, DPM [facility]  Dr. Edrick Kins, DPM    [address] [place] [address]                Office [phone])  [phone]  Fax [phone]

## 2021-11-16 ENCOUNTER — Encounter
Admission: EM | Disposition: A | Discharge status: Home / Self Care | Payer: Self-pay | Source: Home / Self Care | Attending: Internal Medicine

## 2021-11-16 ENCOUNTER — Inpatient Hospital Stay: Payer: BC Managed Care – PPO | Admitting: Certified Registered"

## 2021-11-16 HISTORY — PX: IRRIGATION AND DEBRIDEMENT FOOT: SHX6602

## 2021-11-16 LAB — AEROBIC/ANAEROBIC CULTURE W GRAM STAIN (SURGICAL/DEEP WOUND)

## 2021-11-16 LAB — GLUCOSE, CAPILLARY
Glucose-Capillary: 166 mg/dL — ABNORMAL HIGH (ref 70–99)
Glucose-Capillary: 184 mg/dL — ABNORMAL HIGH (ref 70–99)
Glucose-Capillary: 171 mg/dL — ABNORMAL HIGH (ref 70–99)
Glucose-Capillary: 138 mg/dL — ABNORMAL HIGH (ref 70–99)

## 2021-11-16 SURGERY — IRRIGATION AND DEBRIDEMENT FOOT
Anesthesia: General | Site: Ankle | Laterality: Right

## 2021-11-16 MED ORDER — ONDANSETRON HCL 4 MG/2ML IJ SOLN
INTRAMUSCULAR | Status: AC
  Filled 2021-11-16: qty 2

## 2021-11-16 MED ORDER — PROPOFOL 10 MG/ML IV BOLUS
INTRAVENOUS | Status: DC | PRN
  Administered 2021-11-16: 200 mg via INTRAVENOUS
  Administered 2021-11-16 (×2): 50 mg via INTRAVENOUS

## 2021-11-16 MED ORDER — ONDANSETRON HCL 4 MG/2ML IJ SOLN
INTRAMUSCULAR | Status: DC | PRN
  Administered 2021-11-16: 4 mg via INTRAVENOUS

## 2021-11-16 MED ORDER — LIDOCAINE HCL (PF) 2 % IJ SOLN
INTRAMUSCULAR | Status: AC
  Filled 2021-11-16: qty 5

## 2021-11-16 MED ORDER — INSULIN ASPART 100 UNIT/ML IJ SOLN
5.0000 [IU] | Freq: Three times a day (TID) | INTRAMUSCULAR | Status: DC
  Administered 2021-11-16 – 2021-11-19 (×9): 5 [IU] via SUBCUTANEOUS
  Filled 2021-11-16 (×9): qty 1

## 2021-11-16 MED ORDER — MIDAZOLAM HCL 5 MG/5ML IJ SOLN
INTRAMUSCULAR | Status: DC | PRN
  Administered 2021-11-16: 2 mg via INTRAVENOUS

## 2021-11-16 MED ORDER — FENTANYL CITRATE (PF) 100 MCG/2ML IJ SOLN
25.0000 ug | INTRAMUSCULAR | Status: DC | PRN
  Administered 2021-11-16: 25 ug via INTRAVENOUS

## 2021-11-16 MED ORDER — HYDROCODONE-ACETAMINOPHEN 7.5-325 MG PO TABS: 1.0000 | ORAL_TABLET | Freq: Once | ORAL | Status: DC | PRN

## 2021-11-16 MED ORDER — FENTANYL CITRATE (PF) 100 MCG/2ML IJ SOLN
INTRAMUSCULAR | Status: AC
  Administered 2021-11-16: 25 ug via INTRAVENOUS
  Filled 2021-11-16: qty 2

## 2021-11-16 MED ORDER — ONDANSETRON HCL 4 MG/2ML IJ SOLN: 4.0000 mg | Freq: Once | INTRAMUSCULAR | Status: DC | PRN

## 2021-11-16 MED ORDER — PROPOFOL 10 MG/ML IV BOLUS
INTRAVENOUS | Status: AC
  Filled 2021-11-16: qty 20

## 2021-11-16 MED ORDER — FENTANYL CITRATE (PF) 100 MCG/2ML IJ SOLN
INTRAMUSCULAR | Status: DC | PRN
  Administered 2021-11-16: 25 ug via INTRAVENOUS
  Administered 2021-11-16: 50 ug via INTRAVENOUS
  Administered 2021-11-16: 25 ug via INTRAVENOUS

## 2021-11-16 MED ORDER — DROPERIDOL 2.5 MG/ML IJ SOLN
0.6250 mg | Freq: Once | INTRAMUSCULAR | Status: DC | PRN
  Filled 2021-11-16: qty 2

## 2021-11-16 MED ORDER — BUPIVACAINE HCL 0.5 % IJ SOLN
INTRAMUSCULAR | Status: DC | PRN
  Administered 2021-11-16: 20 mL

## 2021-11-16 MED ORDER — FENTANYL CITRATE (PF) 100 MCG/2ML IJ SOLN
INTRAMUSCULAR | Status: AC
  Filled 2021-11-16: qty 2

## 2021-11-16 MED ORDER — PHENYLEPHRINE HCL (PRESSORS) 10 MG/ML IV SOLN
INTRAVENOUS | Status: DC | PRN
  Administered 2021-11-16 (×6): 200 ug via INTRAVENOUS

## 2021-11-16 MED ORDER — SODIUM CHLORIDE 0.9 % IR SOLN
Status: DC | PRN
  Administered 2021-11-16: 3000 mL

## 2021-11-16 MED ORDER — SODIUM CHLORIDE 0.9 % IV SOLN: INTRAVENOUS | Status: DC | PRN

## 2021-11-16 MED ORDER — PHENYLEPHRINE HCL (PRESSORS) 10 MG/ML IV SOLN
INTRAVENOUS | Status: AC
  Filled 2021-11-16: qty 1

## 2021-11-16 MED ORDER — MEPERIDINE HCL 25 MG/ML IJ SOLN: 6.2500 mg | INTRAMUSCULAR | Status: DC | PRN

## 2021-11-16 MED ORDER — LACTATED RINGERS IV SOLN: INTRAVENOUS | Status: DC

## 2021-11-16 MED ORDER — 0.9 % SODIUM CHLORIDE (POUR BTL) OPTIME
TOPICAL | Status: DC | PRN
  Administered 2021-11-16: 500 mL

## 2021-11-16 MED ORDER — MIDAZOLAM HCL 2 MG/2ML IJ SOLN
INTRAMUSCULAR | Status: AC
  Filled 2021-11-16: qty 2

## 2021-11-16 SURGICAL SUPPLY — 61 items
BNDG COHESIVE 4X5 TAN ST LF (GAUZE/BANDAGES/DRESSINGS) ×3 IMPLANT
BNDG COHESIVE 6X5 TAN ST LF (GAUZE/BANDAGES/DRESSINGS) ×3 IMPLANT
BNDG CONFORM 2 STRL LF (GAUZE/BANDAGES/DRESSINGS) ×3 IMPLANT
BNDG CONFORM 3 STRL LF (GAUZE/BANDAGES/DRESSINGS) ×3 IMPLANT
BNDG ELASTIC 4X5.8 VLCR STR LF (GAUZE/BANDAGES/DRESSINGS) ×3 IMPLANT
BNDG ESMARK 4X12 TAN STRL LF (GAUZE/BANDAGES/DRESSINGS) ×3 IMPLANT
BNDG GAUZE ELAST 4 BULKY (GAUZE/BANDAGES/DRESSINGS) ×3 IMPLANT
CNTNR SPEC 2.5X3XGRAD LEK (MISCELLANEOUS) ×5
CONT SPEC 4OZ STER OR WHT (MISCELLANEOUS) ×10
CONT SPEC 4OZ STRL OR WHT (MISCELLANEOUS) ×5
CONTAINER SPEC 2.5X3XGRAD LEK (MISCELLANEOUS) IMPLANT
CUFF TOURN SGL QUICK 18X4 (TOURNIQUET CUFF) ×2 IMPLANT
DRSG ADAPTIC 3X8 NADH LF (GAUZE/BANDAGES/DRESSINGS) ×2 IMPLANT
DRSG EMULSION OIL 3X8 NADH (GAUZE/BANDAGES/DRESSINGS) ×3 IMPLANT
DURAPREP 26ML APPLICATOR (WOUND CARE) ×3 IMPLANT
ELECT REM PT RETURN 9FT ADLT (ELECTROSURGICAL) ×3
ELECTRODE REM PT RTRN 9FT ADLT (ELECTROSURGICAL) ×1 IMPLANT
GAUZE 4X4 16PLY ~~LOC~~+RFID DBL (SPONGE) ×3 IMPLANT
GAUZE PACKING 1/2X5YD (GAUZE/BANDAGES/DRESSINGS) ×2 IMPLANT
GAUZE PACKING 1/4 X5 YD (GAUZE/BANDAGES/DRESSINGS) IMPLANT
GAUZE PACKING IODOFORM 1X5 (PACKING) IMPLANT
GAUZE SPONGE 4X4 12PLY STRL (GAUZE/BANDAGES/DRESSINGS) ×3 IMPLANT
GLOVE SRG 8 PF TXTR STRL LF DI (GLOVE) ×1 IMPLANT
GLOVE SURG ENC TEXT LTX SZ8 (GLOVE) ×3 IMPLANT
GLOVE SURG UNDER POLY LF SZ8 (GLOVE) ×3
GOWN STRL REUS W/ TWL XL LVL3 (GOWN DISPOSABLE) ×1 IMPLANT
GOWN STRL REUS W/TWL MED LVL3 (GOWN DISPOSABLE) ×3 IMPLANT
GOWN STRL REUS W/TWL XL LVL3 (GOWN DISPOSABLE) ×3
IV NS IRRIG 3000ML ARTHROMATIC (IV SOLUTION) ×2 IMPLANT
KIT TURNOVER KIT A (KITS) ×3 IMPLANT
LABEL OR SOLS (LABEL) ×3 IMPLANT
MANIFOLD NEPTUNE II (INSTRUMENTS) ×3 IMPLANT
NDL ASP BN MRW 11GX15 (NEEDLE) IMPLANT
NDL FILTER BLUNT 18X1 1/2 (NEEDLE) ×1 IMPLANT
NDL HYPO 25X1 1.5 SAFETY (NEEDLE) ×1 IMPLANT
NEEDLE ASP BN MRW 11GX15 (NEEDLE) ×3 IMPLANT
NEEDLE FILTER BLUNT 18X 1/2SAF (NEEDLE) ×2
NEEDLE FILTER BLUNT 18X1 1/2 (NEEDLE) ×1 IMPLANT
NEEDLE HYPO 25X1 1.5 SAFETY (NEEDLE) ×3 IMPLANT
NS IRRIG 500ML POUR BTL (IV SOLUTION) ×3 IMPLANT
PACK EXTREMITY ARMC (MISCELLANEOUS) ×3 IMPLANT
PAD ABD DERMACEA PRESS 5X9 (GAUZE/BANDAGES/DRESSINGS) ×3 IMPLANT
PULSAVAC PLUS IRRIG FAN TIP (DISPOSABLE) ×3
SOL PREP PVP 2OZ (MISCELLANEOUS) ×3
SOLUTION PREP PVP 2OZ (MISCELLANEOUS) ×1 IMPLANT
STAPLER SKIN PROX 35W (STAPLE) IMPLANT
STOCKINETTE IMPERVIOUS 9X36 MD (GAUZE/BANDAGES/DRESSINGS) ×3 IMPLANT
SUT ETHILON 2 0 FS 18 (SUTURE) IMPLANT
SUT ETHILON 3-0 FS-10 30 BLK (SUTURE) ×3
SUT ETHILON 4-0 (SUTURE)
SUT ETHILON 4-0 FS2 18XMFL BLK (SUTURE)
SUT PROLENE 3 0 PS 2 (SUTURE) IMPLANT
SUT VIC AB 3-0 SH 27 (SUTURE)
SUT VIC AB 3-0 SH 27X BRD (SUTURE) IMPLANT
SUT VIC AB 4-0 FS2 27 (SUTURE) IMPLANT
SUTURE EHLN 3-0 FS-10 30 BLK (SUTURE) IMPLANT
SUTURE ETHLN 4-0 FS2 18XMF BLK (SUTURE) IMPLANT
SWAB CULTURE AMIES ANAERIB BLU (MISCELLANEOUS) IMPLANT
SYR 10ML LL (SYRINGE) ×6 IMPLANT
TIP FAN IRRIG PULSAVAC PLUS (DISPOSABLE) IMPLANT
WATER STERILE IRR 500ML POUR (IV SOLUTION) ×3 IMPLANT

## 2021-11-16 NOTE — Assessment & Plan Note (Signed)
Management per podiatry. 2nd I&D washout and debridement in the OR this morning.  Bone biopsy obtained. Nonweightbearing on the right lower extremity

## 2021-11-16 NOTE — Assessment & Plan Note (Signed)
Last therapeutic phlebotomy in October 2022.  RBC 6.0 on admission, hemoglobin 15.2, hematocrit 47.4.  In the setting of testosterone replacement therapy.  Status post phlebotomy on 12/12. -- Continue as needed therapeutic phlebotomy --low-dose aspirin -- Daily CBCs to monitor

## 2021-11-16 NOTE — Assessment & Plan Note (Signed)
Assessment management as outlined, see acute osteomyelitis

## 2021-11-16 NOTE — Anesthesia Procedure Notes (Signed)
Procedure Name: LMA Insertion Date/Time: 11/16/2021 8:25 AM Performed by: [name], MD Pre-anesthesia Checklist: Patient identified, Patient being monitored, Timeout performed, Emergency Drugs available and Suction available Patient Re-evaluated:Patient Re-evaluated prior to induction Oxygen Delivery Method: Circle system utilized Preoxygenation: Pre-oxygenation with 100% oxygen Induction Type: IV induction Ventilation: Mask ventilation without difficulty LMA: LMA inserted LMA Size: 5.0 Tube type: Oral Number of attempts: 1 Placement Confirmation: positive ETCO2 and breath sounds checked- equal and bilateral Tube secured with: Tape Dental Injury: Teeth and Oropharynx as per pre-operative assessment  Comments: Initial attempt with #4.5 AirQ LMA but it did not seat well;  Easily replaced with #5 Ambu Aurastraight LMA.

## 2021-11-16 NOTE — Anesthesia Postprocedure Evaluation (Signed)
Anesthesia Post Note  Patient: Jonathan Fields  Procedure(s) Performed: IRRIGATION AND DEBRIDEMENT ANKLE (Right: Ankle)  Patient location during evaluation: PACU Anesthesia Type: General Level of consciousness: awake and alert Pain management: pain level controlled Vital Signs Assessment: post-procedure vital signs reviewed and stable Respiratory status: spontaneous breathing, nonlabored ventilation, respiratory function stable and patient connected to nasal cannula oxygen Cardiovascular status: blood pressure returned to baseline and stable Postop Assessment: no apparent nausea or vomiting Anesthetic complications: no   No notable events documented.   Last Vitals:  Vitals:   11/16/21 1030 11/16/21 1625  BP: 138/89 109/80  Pulse: 89 76  Resp: 15 18  Temp: 36.9 C 36.7 C  SpO2: 92% 95%    Last Pain:  Vitals:   11/16/21 1625  TempSrc: Oral  PainSc:                  [name]

## 2021-11-16 NOTE — Transfer of Care (Signed)
Immediate Anesthesia Transfer of Care Note  Patient: Jonathan Fields  Procedure(s) Performed: IRRIGATION AND DEBRIDEMENT ANKLE (Right: Ankle)  Patient Location: PACU  Anesthesia Type:General  Level of Consciousness: drowsy  Airway & Oxygen Therapy: Patient Spontanous Breathing and Patient connected to face mask oxygen  Post-op Assessment: Report given to RN and Post -op Vital signs reviewed and stable  Post vital signs: Reviewed and stable  Last Vitals:  Vitals Value Taken Time  BP    Temp    Pulse    Resp    SpO2      Last Pain:  Vitals:   11/16/21 0543  TempSrc: Oral  PainSc:       Patients Stated Pain Goal: 2 (73/53/29 9242)  Complications: No notable events documented.

## 2021-11-16 NOTE — Progress Notes (Signed)
Progress Note    Jonathan Fields   JOI:[id number]  DOB: 10-Oct-1956  DOA: 11/10/2021     6 Date of Service: 11/16/2021   Brief narrative Jonathan Fields is a 65 y.o. male with a PMH significant for type II DM, HTN, BPH, OSA, cavus deformity right foot s/p stabilization 47/0962 complicated by wound dehiscence and failed outpatient antibiotic treatment.  He presented from home to the ED on 11/10/2021 with right ankle pain redness, draining and swelling x several days. In the ED, it was found that they had cellulitis and abscess, started on IV antibiotics. Podiatry was consulted and performed bedside I&D for abscess.  MRI showed signs of osteomyelitis with septic arthritis.  Orthopedic surgery was also consulted.   Patient admitted to medicine service for further workup and management of right ankle septic arthritis as outlined in detail below.      Assessment and Plan * Postoperative cellulitis and abscess of surgical wound right ankle Assessment management as outlined, see acute osteomyelitis  Abscess of bursa, right ankle and foot Management per podiatry. 2nd I&D washout and debridement in the OR this morning.  Bone biopsy obtained. Nonweightbearing on the right lower extremity  Septic arthritis of right ankle ([facility]) On IV antibiotics as outlined. Follow cultures.  Acute osteomyelitis of right ankle ([place]) In setting of right ankle stabilization surgery in October 2022.  Podiatry performed bedside I&D with packing of abscess on admission, 11/11/2021.  OR for washout and debridement on 12/14 and 12/17.   Bedside and OR cultures: +Staph lugdunensis -- Podiatry, Ortho, infectious disease consulted -- Initially on IV vancomycin >> Ancef -- Monitor fever curve, CBC -- Follow bone biopsy/culture -- Follow OR cultures --Anticipate need for 4-6 weeks IV antibiotics --Scheduled Tylenol and PRN oxycodone for pain, IV dilaudid for breakthrough pain --Bowel regimen while on pain  meds  Cavovarus deformity of foot, acquired, right Status post surgery for stabilization of the ankle over, now with complications as outlined.  Hyperglycemia due to type 2 diabetes mellitus ([place]) Diabetes uncontrolled with A1c 9.5% (11/04/2021), up from 7.8% in May -- Continue sliding scale NovoLog -- Increase mealtime NovoLog 3>>5 units TID -- Semglee 10 units daily -- Adjust insulin as needed for inpatient goal 140-180  Erythrocytosis Last therapeutic phlebotomy in October 2022.  RBC 6.0 on admission, hemoglobin 15.2, hematocrit 47.4.  In the setting of testosterone replacement therapy.  Status post phlebotomy on 12/12. -- Continue as needed therapeutic phlebotomy --low-dose aspirin -- Daily CBCs to monitor  Hypertension BP elevated on admission.  Continues to be elevated intermittently. -- Continue lisinopril 40 mg daily --As needed hydralazine --Consider addition of second agent if BP remains uncontrolled -- Monitor BP     Subjective:  Patient seen with wife at bedside after returning from the OR this morning.  He underwent further debridement and bone biopsy was taken.  Patient reports feeling well, pain controlled.  He is hungry and ordering food.  No other acute complaints.  Objective Vitals:   11/16/21 1004 11/16/21 1009 11/16/21 1015 11/16/21 1030  BP:   123/81 138/89  Pulse: 82 85 82 89  Resp: 11 15 14 15   Temp:   98.2 F (36.8 C) 98.5 F (36.9 C)  TempSrc:    Oral  SpO2: 95% 97% 94% 92%  Weight:      Height:       102.1 kg  Vital signs were reviewed and unremarkable.   Exam General exam: awake, alert, no acute distress HEENT: moist mucus  membranes, hearing grossly normal  Respiratory system: normal respiratory effort, on room air, symmetric chest rise. Cardiovascular system: RRR, no peripheral edema.   Central nervous system: A&O x4. no gross focal neurologic deficits, normal speech Extremities: Right foot and ankle with clean dry and intact bandage,  right foot is warm distally with intact sensation, no edema, normal tone Psychiatry: normal mood, congruent affect, judgement and insight appear normal   Labs / Other Information My review of labs, imaging, notes and other tests is significant for CBGs mildly elevated: Last night 219, this morning 166, lunchtime today 184    OR cultures from 12/14 sensitivities returned, see micro results  Disposition Plan: Status is: Inpatient  Remains inpatient appropriate because: Further ongoing evaluation not appropriate for the outpatient setting.  Awaiting all cultures to guide IV antibiotic therapy.  Discharge pending clearance by podiatry and ID.        Time spent: 25 minutes [facility] 11/16/2021, 1:49 PM

## 2021-11-16 NOTE — Assessment & Plan Note (Signed)
Status post surgery for stabilization of the ankle over, now with complications as outlined.

## 2021-11-16 NOTE — Assessment & Plan Note (Addendum)
Diabetes uncontrolled with A1c 9.5% (11/04/2021), up from 7.8% in May -- Continue sliding scale NovoLog -- Increase mealtime NovoLog 3>>5 units TID -- Semglee 10 units daily -- Adjust insulin as needed for inpatient goal 140-180

## 2021-11-16 NOTE — Brief Op Note (Signed)
11/16/2021  9:46 AM  PATIENT:  Rolland Porter Babula  65 y.o. male  PRE-OPERATIVE DIAGNOSIS:  SEPTIC ARTHRITIS RIGHT ANKLE  POST-OPERATIVE DIAGNOSIS:  SEPTIC ARTHRITIS RIGHT ANKLE  PROCEDURE:  Procedure(s): IRRIGATION AND DEBRIDEMENT ANKLE (Right)  SURGEON:  Surgeon(s) and Role:    Edrick Kins, DPM - Primary    * Trula Slade, DPM - Assisting  PHYSICIAN ASSISTANT:   ASSISTANTS: none   ANESTHESIA:   general  EBL:  10 mL   BLOOD ADMINISTERED:none  DRAINS: none   LOCAL MEDICATIONS USED:  MARCAINE     SPECIMEN:  Source of Specimen:  bone biopsy for pathology and micro talus and fibula.  DISPOSITION OF SPECIMEN:  PATHOLOGY  COUNTS:  YES  TOURNIQUET:   Total Tourniquet Time Documented: Calf (Right) - 38 minutes Total: Calf (Right) - 38 minutes   DICTATION: .Viviann Spare Dictation  PLAN OF CARE: Admit to inpatient   PATIENT DISPOSITION:  PACU - hemodynamically stable.   Delay start of Pharmacological VTE agent (>24hrs) due to surgical blood loss or risk of bleeding: no

## 2021-11-16 NOTE — Assessment & Plan Note (Signed)
On IV antibiotics as outlined. Follow cultures.

## 2021-11-16 NOTE — Assessment & Plan Note (Signed)
BP elevated on admission.  Continues to be elevated intermittently. -- Continue lisinopril 40 mg daily --As needed hydralazine --Consider addition of second agent if BP remains uncontrolled -- Monitor BP

## 2021-11-16 NOTE — Assessment & Plan Note (Signed)
In setting of right ankle stabilization surgery in October 2022.  Podiatry performed bedside I&D with packing of abscess on admission, 11/11/2021.  OR for washout and debridement on 12/14 and 12/17.   Bedside and OR cultures: +Staph lugdunensis -- Podiatry, Ortho, infectious disease consulted -- Initially on IV vancomycin >> Ancef -- Monitor fever curve, CBC -- Follow bone biopsy/culture -- Follow OR cultures --Anticipate need for 4-6 weeks IV antibiotics --Scheduled Tylenol and PRN oxycodone for pain, IV dilaudid for breakthrough pain --Bowel regimen while on pain meds

## 2021-11-16 NOTE — Anesthesia Preprocedure Evaluation (Addendum)
Anesthesia Evaluation  Patient identified by MRN, date of birth, ID band Patient awake    Reviewed: Allergy & Precautions, NPO status , Patient's Chart, lab work & pertinent test results  History of Anesthesia Complications Negative for: history of anesthetic complications  Airway Mallampati: III  TM Distance: >3 FB Neck ROM: Full    Dental no notable dental hx. (+) Dental Advisory Given   Pulmonary sleep apnea (No CPAP use) ,    Pulmonary exam normal breath sounds clear to auscultation       Cardiovascular Exercise Tolerance: Good METS: 5 - 7 Mets hypertension (Controlled), Pt. on medications Normal cardiovascular exam Rhythm:Regular Rate:Normal     Neuro/Psych  Neuromuscular disease ([place]) negative neurological ROS  negative psych ROS   GI/Hepatic Neg liver ROS, GERD  Medicated and Controlled,  Endo/Other  negative endocrine ROSdiabetes, Well Controlled  Renal/GU negative Renal ROS  negative genitourinary   Musculoskeletal  (+) Arthritis ,   Abdominal   Peds  Hematology negative hematology ROS (+)   Anesthesia Other Findings   Reproductive/Obstetrics negative OB ROS                            Anesthesia Physical Anesthesia Plan  ASA: 2  Anesthesia Plan: General   Post-op Pain Management:    Induction: Intravenous  PONV Risk Score and Plan: Ondansetron  Airway Management Planned: Oral ETT and LMA  Additional Equipment:   Intra-op Plan:   Post-operative Plan: Extubation in OR  Informed Consent: I have reviewed the patients History and Physical, chart, labs and discussed the procedure including the risks, benefits and alternatives for the proposed anesthesia with the patient or authorized representative who has indicated his/her understanding and acceptance.     Dental advisory given  Plan Discussed with: CRNA  Anesthesia Plan Comments: (Benefits and risk  discussed with patient to include death, MI and CVA.  Pt wishes to proceed.)        Anesthesia Quick Evaluation

## 2021-11-17 ENCOUNTER — Encounter: Payer: Self-pay | Admitting: Podiatry

## 2021-11-17 LAB — GLUCOSE, CAPILLARY
Glucose-Capillary: 113 mg/dL — ABNORMAL HIGH (ref 70–99)
Glucose-Capillary: 170 mg/dL — ABNORMAL HIGH (ref 70–99)
Glucose-Capillary: 166 mg/dL — ABNORMAL HIGH (ref 70–99)
Glucose-Capillary: 92 mg/dL (ref 70–99)

## 2021-11-17 MED ORDER — FLUTICASONE PROPIONATE 50 MCG/ACT NA SUSP
1.0000 | Freq: Every day | NASAL | Status: DC | PRN
  Filled 2021-11-17: qty 16

## 2021-11-17 MED ORDER — PANTOPRAZOLE SODIUM 40 MG PO TBEC
40.0000 mg | DELAYED_RELEASE_TABLET | Freq: Every day | ORAL | Status: DC
  Administered 2021-11-17 – 2021-11-19 (×3): 40 mg via ORAL
  Filled 2021-11-17 (×3): qty 1

## 2021-11-17 NOTE — Progress Notes (Signed)
Subjective: POD # 1 s/p return to the OR for right ankle I&D, debridement, bone biopsy, removal of hardware.  Having pain this morning, still by pain medicine denies any fevers or chills.   Objective: AAO x3, NAD DP/PT pulses palpable  2/4, CRT less than 3 seconds Dressing intact with mild bloody strikethrough.  Upon removal packing intact to the wound there was no purulence today.  There is mild edema.  There is no fluctuation or crepitation.  No ascending cellulitis. Tenderness to palpation to the incision site.  No pain with calf compression, swelling, warmth, erythema  Assessment: POD # 1 s/p return to the OR  Plan: No CBC this morning. He remains afebrile with vital signs stable.  This point await culture results and infectious disease recommendations for antibiotics.  Nonweightbearing. Continue to elevate. I had a long discussion with the patient today (about 30 minutes) in regards to the course of treatment. Discussed the importance of appropriate antibiotics upon discharge, likely PICC line, in order to [facility] treat the infection. Unfortunately this is a limb treating infection, which he is aware of. Podiatry to continue to follow.   Celesta Gentile, DPM

## 2021-11-17 NOTE — Assessment & Plan Note (Signed)
On IV antibiotics as outlined. Follow cultures.

## 2021-11-17 NOTE — Assessment & Plan Note (Signed)
In setting of right ankle stabilization surgery in October 2022.  Podiatry performed bedside I&D with packing of abscess on admission, 11/11/2021.  OR for washout and debridement on 12/14 and 12/17, bone biopsy obtained.   Bedside and OR cultures: +Staph lugdunensis -- Podiatry & ID following -- Initially on IV vancomycin >> Ancef -- Monitor fever curve, CBC -- Follow bone biopsy/culture -- Follow OR cultures --Anticipate need for 4-6 weeks IV antibiotics --Scheduled Tylenol and PRN oxycodone for pain, IV dilaudid for breakthrough pain --Bowel regimen while on pain meds

## 2021-11-17 NOTE — Progress Notes (Signed)
Progress Note    Jonathan Fields   YQM:[id number]  DOB: January 09, 1956  DOA: 11/10/2021     7 Date of Service: 11/17/2021   Brief narrative Jonathan Fields is a 65 y.o. male with a PMH significant for type II DM, HTN, BPH, OSA, cavus deformity right foot s/p stabilization 52/8413 complicated by wound dehiscence and failed outpatient antibiotic treatment.  He presented from home to the ED on 11/10/2021 with right ankle pain redness, draining and swelling x several days. In the ED, it was found that they had cellulitis and abscess, started on IV antibiotics. Podiatry was consulted and performed bedside I&D for abscess.  MRI showed signs of osteomyelitis with septic arthritis.  Orthopedic surgery was also consulted.   Patient admitted to medicine service for further workup and management of right ankle septic arthritis as outlined in detail below.      Assessment and Plan * Postoperative cellulitis and abscess of surgical wound right ankle Assessment management as outlined, see acute osteomyelitis  Abscess of bursa, right ankle and foot Management per podiatry and as outlined. Nonweightbearing on the right lower extremity  Septic arthritis of right ankle (HCC) On IV antibiotics as outlined. Follow cultures.  Acute osteomyelitis of right ankle ([place]) In setting of right ankle stabilization surgery in October 2022.  Podiatry performed bedside I&D with packing of abscess on admission, 11/11/2021.  OR for washout and debridement on 12/14 and 12/17, bone biopsy obtained.   Bedside and OR cultures: +Staph lugdunensis -- Podiatry & ID following -- Initially on IV vancomycin >> Ancef -- Monitor fever curve, CBC -- Follow bone biopsy/culture -- Follow OR cultures --Anticipate need for 4-6 weeks IV antibiotics --Scheduled Tylenol and PRN oxycodone for pain, IV dilaudid for breakthrough pain --Bowel regimen while on pain meds  Cavovarus deformity of foot, acquired, right Status post surgery  for stabilization of the ankle over, now with complications as outlined.  Hyperglycemia due to type 2 diabetes mellitus ([place]) Diabetes uncontrolled with A1c 9.5% (11/04/2021), up from 7.8% in May CBG's at goal. -- Continue NovoLog 5 units TID WC + SSI -- Semglee 10 units daily -- Adjust insulin as needed for inpatient goal 140-180  Erythrocytosis Last therapeutic phlebotomy in October 2022.  RBC 6.0 on admission, hemoglobin 15.2, hematocrit 47.4.  In the setting of testosterone replacement therapy.  Status post phlebotomy on 12/12. -- Continue as needed therapeutic phlebotomy --low-dose aspirin -- Daily CBCs to monitor  Hypertension BP elevated on admission.  Continues to be elevated intermittently. -- Continue lisinopril 40 mg daily  --As needed hydralazine -- Monitor BP     Subjective:  Patient seen awake sitting up in bed today.  He reports pain earlier this morning was the worst its been yet, after going to the OR yesterday for hardware removal and having bone biopsy and further debridement.  Pain is better now.  No fevers or chills.  No other acute complaints.  Objective Vitals:   11/16/21 1953 11/17/21 0521 11/17/21 0746 11/17/21 1627  BP: 122/78 117/77 129/83 (!) 127/92  Pulse: 80 73 71 81  Resp: 20 16 18 18   Temp: 99.3 F (37.4 C) 98 F (36.7 C) 97.9 F (36.6 C) 98.2 F (36.8 C)  TempSrc: Oral Oral Oral   SpO2: 95% 97% 96% 96%  Weight:      Height:       102.1 kg  Vital signs were reviewed and unremarkable.   Exam General exam: awake, alert, no acute distress Respiratory system: CTAB,  no wheezes, rales or rhonchi, normal respiratory effort. Cardiovascular system: normal S1/S2, RRR, no JVD, murmurs, rubs, gallops, no peripheral edema.   Central nervous system: A&O x4. no gross focal neurologic deficits, normal speech Extremities: Right ankle with clean dry intact bandage in place, the right foot distally is warm with intact sensation, no edema, normal  tone Skin: dry, intact, normal temperature Psychiatry: normal mood, congruent affect, judgement and insight appear normal   Labs / Other Information My review of labs, imaging, notes and other tests is significant for CBGs now at goal    Micro: Bone biopsies of talus and fibula with no WBCs and no organisms seen at less than 24 hours.     Disposition Plan: Status is: Inpatient  Remains inpatient appropriate because: Severity of illness on IV antibiotics awaiting final culture data     Time spent: 26minutes with greater than 50% spent at bedside and in coordination of care   [facility] 11/17/2021, 4:42 PM

## 2021-11-17 NOTE — Assessment & Plan Note (Signed)
Diabetes uncontrolled with A1c 9.5% (11/04/2021), up from 7.8% in May CBG's at goal. -- Continue NovoLog 5 units TID WC + SSI -- Semglee 10 units daily -- Adjust insulin as needed for inpatient goal 140-180

## 2021-11-17 NOTE — Assessment & Plan Note (Signed)
Assessment management as outlined, see acute osteomyelitis

## 2021-11-17 NOTE — Assessment & Plan Note (Signed)
Management per podiatry and as outlined. Nonweightbearing on the right lower extremity

## 2021-11-17 NOTE — Assessment & Plan Note (Signed)
Status post surgery for stabilization of the ankle over, now with complications as outlined.

## 2021-11-17 NOTE — Op Note (Signed)
OPERATIVE REPORT Patient name: Jonathan Fields MRN: [id number] DOB: 07-Nov-1956  DOS: 11/17/21  Preop Dx: Abscess RT ankle. Osteomyelitis right ankle Postop Dx: same  Procedure:  1. Incision and drainage right ankle 2. Bone biopsy right ankle  Surgeon: Edrick Kins DPM  Anesthesia: General anesthesia  Hemostasis: Calf tourniquet inflated to a pressure of 219mmHg without esmarch exsanguination   EBL: 10 mL Materials: none Injectables: 32mL 0.5% marcaine plain infiltrated in a high ankle block right Pathology: Bone Biopsy of the talus and fibula sent for culture and pathology  Condition: The patient tolerated the procedure and anesthesia well. No complications noted or reported   Justification for procedure: The patient is a 65 y.o. male admitted for worsening infection and abscess of the right ankle. Patient had underwent incision and drainage 11/13/2021 and returns for repeat irrigation and debridement with bone biopsy. The patient was told benefits as well as possible side effects of the surgery. The patient consented for surgical correction. The patient consent form was reviewed. All patient questions were answered. No guarantees were expressed or implied. The patient and the surgeon both signed the patient consent form with the witness present and placed in the patient's chart.   Procedure in Detail: The patient was brought to the operating room, placed in the operating table in the supine position at which time an aseptic scrub and drape were performed about the patient's respective lower extremity after anesthesia was induced as described above. Attention was then directed to the surgical area where procedure number one commenced.  Procedure #1: Incision and drainage right ankle All sutures from prior surgery on 11/13/2021 were removed and blunt dissection was utilized to the medial and lateral aspects of the ankle. There was no purulence or evidence of abscess development  to the medial aspect of the ankle.  The lateral aspect of the ankle was expolored thoroughly using a combination of blunt soft tissue dissection and sharp excision of any purulent nonviable tissue in the area. The open incision to the lateral ankle from prior incision and drainage was approximately 4cm. This was extended distally approximately 1cm using a no15 scalpel to ensure there were no remaining areas of abscess that had not been perforated and drained. In total approximately 5-22ml of purulence was drained from the lateral portion of the ankle.  All suture from the prior lateral ankle stabilization procedure (Arthrex internal brace) visualized within the incision site was removed. One of the plastic anchors utilized from prior surgery was able to be removed and this was sent for culture as well. Copious irrigation was then utilized using 3L normal saline and pulse lavage to the area.   Procedure #2: Bone biopsy right ankle x2 Attention was then directed to the talus and fibular malleolus where a bone biopsy was harvested from each within the incision site using a small bone trephine. Each bone biopsy was placed in a sterile container and sent to pathology for both bone culture and microscopic exam. 3-0 nylon suture was then utilized for primary closure of the lateral incision site. A small central portion of the incision site was left open and 1/2" iodoform packing was utilized.  Dry sterile compressive dressings were then applied to all previously mentioned incision sites about the patient's lower extremity. The tourniquet which was used for hemostasis was deflated. All normal neurovascular responses including pink color and warmth returned all the digits of patient's lower extremity.  The patient was then transferred from the operating room to the  recovery room having tolerated the procedure and anesthesia well. All vital signs are stable. After a brief stay in the recovery room the patient was  readmitted to inpatient room with adequate prescriptions for analgesia. Continue nonweightbearing to the surgical extremity. Keep dressings clean and dry.     Edrick Kins, DPM [facility]  Dr. Edrick Kins, DPM    [address], [place] [address]                Office [phone]  Fax [phone]

## 2021-11-17 NOTE — Assessment & Plan Note (Signed)
BP elevated on admission.  Continues to be elevated intermittently. -- Continue lisinopril 40 mg daily  --As needed hydralazine -- Monitor BP

## 2021-11-17 NOTE — Assessment & Plan Note (Signed)
Last therapeutic phlebotomy in October 2022.  RBC 6.0 on admission, hemoglobin 15.2, hematocrit 47.4.  In the setting of testosterone replacement therapy.  Status post phlebotomy on 12/12. -- Continue as needed therapeutic phlebotomy --low-dose aspirin -- Daily CBCs to monitor

## 2021-11-18 ENCOUNTER — Inpatient Hospital Stay: Payer: Self-pay

## 2021-11-18 LAB — CBC
HCT: 37.6 % — ABNORMAL LOW (ref 39.0–52.0)
Hemoglobin: 12.3 g/dL — ABNORMAL LOW (ref 13.0–17.0)
MCH: 25.4 pg — ABNORMAL LOW (ref 26.0–34.0)
MCHC: 32.7 g/dL (ref 30.0–36.0)
MCV: 77.5 fL — ABNORMAL LOW (ref 80.0–100.0)
Platelets: 294 10*3/uL (ref 150–400)
RBC: 4.85 MIL/uL (ref 4.22–5.81)
RDW: 14 % (ref 11.5–15.5)
WBC: 7.7 10*3/uL (ref 4.0–10.5)
nRBC: 0 % (ref 0.0–0.2)

## 2021-11-18 LAB — GLUCOSE, CAPILLARY
Glucose-Capillary: 180 mg/dL — ABNORMAL HIGH (ref 70–99)
Glucose-Capillary: 149 mg/dL — ABNORMAL HIGH (ref 70–99)
Glucose-Capillary: 245 mg/dL — ABNORMAL HIGH (ref 70–99)
Glucose-Capillary: 122 mg/dL — ABNORMAL HIGH (ref 70–99)
Glucose-Capillary: 210 mg/dL — ABNORMAL HIGH (ref 70–99)

## 2021-11-18 LAB — BASIC METABOLIC PANEL
Anion gap: 6 (ref 5–15)
BUN: 17 mg/dL (ref 8–23)
CO2: 26 mmol/L (ref 22–32)
Calcium: 8.4 mg/dL — ABNORMAL LOW (ref 8.9–10.3)
Chloride: 101 mmol/L (ref 98–111)
Creatinine, Ser: 0.86 mg/dL (ref 0.61–1.24)
GFR, Estimated: >60 mL/min (ref >60)
Glucose, Bld: 175 mg/dL — ABNORMAL HIGH (ref 70–99)
Potassium: 4 mmol/L (ref 3.5–5.1)
Sodium: 133 mmol/L — ABNORMAL LOW (ref 135–145)

## 2021-11-18 LAB — AEROBIC/ANAEROBIC CULTURE W GRAM STAIN (SURGICAL/DEEP WOUND)

## 2021-11-18 MED ORDER — LACTULOSE 10 GM/15ML PO SOLN
20.0000 g | Freq: Two times a day (BID) | ORAL | Status: DC | PRN
  Filled 2021-11-18: qty 30

## 2021-11-18 NOTE — Progress Notes (Signed)
Consent obtained for PICC placement. Patient requested waiting until 11-20 AM for placement. Danae Chen RN notified. VAST to follow up 11-20 am.

## 2021-11-18 NOTE — Progress Notes (Signed)
Subjective:  Patient ID: Jonathan Fields, male    DOB: 09/08/1956,  MRN: [id number]  POD#2 s/p return to OR for right ankle I&D, debridement, bone biopsy,removal of hardware. Relates continued pain in the ankle. Denies fever, chills, nausea, vomiting.   Past Medical History:  Diagnosis Date   BPH (benign prostatic hyperplasia)    Bronchitis    Complication of anesthesia    hard to wake up   DM (diabetes mellitus), type 2 ([place])    DVT (deep venous thrombosis) ([place])    post op   ED (erectile dysfunction)    Erythrocytosis 07/04/2020   GERD (gastroesophageal reflux disease)    Hyperlipemia    Hypertension    Hypogonadism in male    Nocturia    Right ankle instability    Sleep apnea    TB lung, latent    Urinary frequency      Past Surgical History:  Procedure Laterality Date   ANKLE ARTHROSCOPY Right 11/13/2021   Procedure: ANKLE ARTHROSCOPY Arthroscopic Irrigation;  Surgeon: [name], DPM;  Location: [facility] ORS;  Service: Podiatry;  Laterality: Right;   ANKLE SURGERY Left    torn ligament   CARPAL TUNNEL RELEASE Right 08/2019   COLONOSCOPY  2009   IRRIGATION AND DEBRIDEMENT ABSCESS Right 11/13/2021   Procedure: IRRIGATION AND DEBRIDEMENT ABSCESS;  Surgeon: [name], DPM;  Location: [facility] ORS;  Service: Podiatry;  Laterality: Right;   IRRIGATION AND DEBRIDEMENT FOOT Right 11/16/2021   Procedure: IRRIGATION AND DEBRIDEMENT ANKLE;  Surgeon: Edrick Kins, DPM;  Location: [facility] ORS;  Service: Podiatry;  Laterality: Right;   KNEE ARTHROSCOPY Left    right ankle ruptured tendon surgery  2008   TRANSURETHRAL RESECTION OF PROSTATE N/A 02/19/2018   Procedure: TRANSURETHRAL RESECTION OF THE PROSTATE (TURP);  Surgeon: [name], MD;  Location: [facility] ORS;  Service: Urology;  Laterality: N/A;    CBC Latest Ref Rng & Units 11/18/2021 11/14/2021 11/13/2021  WBC 4.0 - 10.5 K/uL 7.7 6.6 6.7  Hemoglobin 13.0 - 17.0 g/dL 12.3(L) 13.6 13.7  Hematocrit 39.0 - 52.0 %  37.6(L) 42.0 42.5  Platelets 150 - 400 K/uL 294 260 226    BMP Latest Ref Rng & Units 11/18/2021 11/15/2021 11/14/2021  Glucose 70 - 99 mg/dL 175(H) - 217(H)  BUN 8 - 23 mg/dL 17 - 21  Creatinine 0.61 - 1.24 mg/dL 0.86 0.71 0.85  BUN/Creat Ratio 10 - 24 - - -  Sodium 135 - 145 mmol/L 133(L) - 134(L)  Potassium 3.5 - 5.1 mmol/L 4.0 - 3.9  Chloride 98 - 111 mmol/L 101 - 102  CO2 22 - 32 mmol/L 26 - 24  Calcium 8.9 - 10.3 mg/dL 8.4(L) - 8.4(L)     Objective:   Vitals:   11/18/21 0524 11/18/21 0753  BP: 135/86 (!) 136/104  Pulse: 81 91  Resp: 16 19  Temp: 99 F (37.2 C) 98.7 F (37.1 C)  SpO2: 94% 100%    General:AA&O x 3. Normal mood and affect   Vascular: DP and PT pulses 2/4 bilateral. Brisk capillary refill to all digits. Pedal hair present   Neruological. Epicritic sensation grossly intact.   Derm:Dressing intact with minimal strike through. Incision intact with central packing removed today. Mild edema surrounding and erythema periwound. No purulence noted. No ascending erythema. Tenderness to palpation of the site.   MSK: MMT 5/5 in dorsiflexion, plantar flexion, inversion and eversion. Pain with ROM of ankle.      Assessment & Plan:  Patient was evaluated and treated and all questions answered.  DX: s/p OR for right ankle I&D, debridement, bone biopsy,removal of hardware. Wound care: iodoform packing, DSD  Cultures no growth <24 hours. Follow ID recommendations.  Continue to elevate. Nonweightbearing.  Discussed with patient diagnosis and treatment options. Patient is aware of plan and likely PICC line for continued antibiotics and patient is aware of limb threatening infection. Podiatry will continue to follow.  Patient in agreement with plan and all questions answered.   Lorenda Peck, MD  Accessible via secure chat for questions or concerns.

## 2021-11-18 NOTE — Treatment Plan (Signed)
Diagnosis: Staph lugdunensis septic arthritis/osteomyelitis rt ankle Baseline Creatinine <1   Allergies  Allergen Reactions   Penicillins Hives    Has patient had a PCN reaction causing immediate rash, facial/tongue/throat swelling, SOB or lightheadedness with hypotension: yes Has patient had a PCN reaction causing severe rash involving mucus membranes or skin necrosis: no Has patient had a PCN reaction that required hospitalization: no Has patient had a PCN reaction occurring within the last 10 years: no If all of the above answers are "NO", then may proceed with Cephalosporin use.     OPAT Orders Discharge antibiotics: Cefazolin 2 grams IV every 8 hours Duration: Total 6 weeks End Date: 12/25/21  [facility] Care Per Protocol:  Labs weekly while on IV antibiotics: _X_ CBC with differential _X_ CMP _X_ CRP _X_ ESR   _X_ Please pull PIC at completion of IV antibiotics  Fax weekly labs to [phone]  Clinic Follow Up Appt:12/12/21 at 11am   Call [phone] with any questions or critical values

## 2021-11-18 NOTE — Progress Notes (Signed)
Physical Therapy Treatment Patient Details Name: Jonathan Fields MRN: [id number] DOB: 1955/12/07 Today's Date: 11/18/2021   History of Present Illness pt is a 65 y.o. male that presented to ED for continued pain and tenderness of R ankle.  RT ankle incision and drainage 11/13/2021 secondary to septic joint from complication of lateral ankle stabilization procedure 09/05/2021. Scheduled for I&D 12/17.  PMH of TB, DM, GERD, HLD, HTN, sleep apnea    PT Comments    Pt alert, agreeable to PT with motivation. Somewhat self limiting this session as well as directive of care. Able to transfer to EOB with use of bed rails, modI. From EOB he asked for assistance to stand. Educated on proper technique, needed just a very light minA to come up into standing. Able to use urinal in standing with CGA, great adherence to NWB status. After a seated rest break, able to hop to bathroom with RW, very short hops, and instructed on keeping RW within BOS. ModI for pericare, and CGA from [facility] over standard commode. Pt declined further mobility attempts, and unable to answer whether he was more comfortable with RW or crutches at this time. Declined trialing crutches again this session. The patient would benefit from further skilled PT intervention to continue to progress towards goals. Recommendation remains appropriate.    Recommendations for follow up therapy are one component of a multi-disciplinary discharge planning process, led by the attending physician.  Recommendations may be updated based on patient status, additional functional criteria and insurance authorization.  Follow Up Recommendations  Outpatient PT     Assistance Recommended at Discharge Intermittent Supervision/Assistance  Equipment Recommendations  Rolling walker (2 wheels);Crutches (extra tall crutches)    Recommendations for Other Services       Precautions / Restrictions Precautions Precautions: Fall Restrictions Weight Bearing  Restrictions: Yes RLE Weight Bearing: Non weight bearing     Mobility  Bed Mobility Overal bed mobility: Independent                  Transfers Overall transfer level: Needs assistance   Transfers: Sit to/from Stand Sit to Stand: Min guard;Min assist           General transfer comment: EOB minA for steadying with RW use. cued for hand placement    Ambulation/Gait   Gait Distance (Feet): 25 Feet Assistive device: Rolling walker (2 wheels)         General Gait Details: hop to pattern, great adherence to NWB precautions. some mild unsteadiness noted. very short hops   Marine scientist [place] (Stroke Patients Only)       Balance Overall balance assessment: Needs assistance Sitting-balance support: Feet supported Sitting balance-Leahy Scale: Normal       Standing balance-Leahy Scale: Fair Standing balance comment: reliant on UE support for any dynamic activties/ambulation                            Cognition Arousal/Alertness: Awake/alert Behavior During Therapy: WFL for tasks assessed/performed Overall Cognitive Status: Within Functional Limits for tasks assessed                                          Exercises Other Exercises Other Exercises: LAQ x10 seated marches x10    General  Comments        Pertinent Vitals/Pain Pain Assessment: 0-10 Pain Score: 4  Pain Location: R ankle Pain Intervention(s): Limited activity within patient's tolerance;Monitored during session;Repositioned    Home Living                          Prior Function            PT Goals (current goals can now be found in the care plan section) Progress towards PT goals: Progressing toward goals    Frequency    Min 2X/week      PT Plan Current plan remains appropriate;Equipment recommendations need to be updated    Co-evaluation              AM-PAC PT "6 Clicks"  Mobility   Outcome Measure  Help needed turning from your back to your side while in a flat bed without using bedrails?: None Help needed moving from lying on your back to sitting on the side of a flat bed without using bedrails?: None Help needed moving to and from a bed to a chair (including a wheelchair)?: None Help needed standing up from a chair using your arms (e.g., wheelchair or bedside chair)?: None Help needed to walk in hospital room?: A Little Help needed climbing 3-5 steps with a railing? : A Little 6 Click Score: 22    End of Session   Activity Tolerance: Patient tolerated treatment well;Patient limited by pain Patient left: in bed;with call bell/phone within reach;with bed alarm set Nurse Communication: Mobility status PT Visit Diagnosis: Other abnormalities of gait and mobility (R26.89);Difficulty in walking, not elsewhere classified (R26.2);Muscle weakness (generalized) (M62.81);Pain Pain - Right/Left: Right Pain - part of body: Ankle and joints of foot     Time: 6606-3016 PT Time Calculation (min) (ACUTE ONLY): 27 min  Charges:  $Therapeutic Activity: 23-37 mins                    Lieutenant Diego PT, DPT 9:43 AM,11/18/21

## 2021-11-18 NOTE — Progress Notes (Signed)
° °  Date of Admission:  11/10/2021     ID: Jonathan Fields is a 65 y.o. male  Principal Problem:   Postoperative cellulitis and abscess of surgical wound right ankle Active Problems:   Hypertension   Cavovarus deformity of foot, acquired, right   Erythrocytosis   Hyperglycemia due to type 2 diabetes mellitus ([place])   Acute osteomyelitis of right ankle (HCC)   Septic arthritis of right ankle (HCC)   Synovitis of right ankle   Abscess of bursa, right ankle and foot    Subjective: Patient had surgery on Saturday. Anxious. Worried whether it will heal completely   Medications:   acetaminophen  1,000 mg Oral Q8H   Or   acetaminophen  650 mg Rectal Q8H   aspirin EC  81 mg Oral Daily   atorvastatin  40 mg Oral Daily   enoxaparin (LOVENOX) injection  40 mg Subcutaneous Q24H   insulin aspart  0-15 Units Subcutaneous TID WC   insulin aspart  5 Units Subcutaneous TID WC   insulin glargine-yfgn  10 Units Subcutaneous Daily   lisinopril  40 mg Oral Daily   pantoprazole  40 mg Oral Daily   senna-docusate  1 tablet Oral QHS    Objective: Vital signs in last 24 hours: Temp:  [98.2 F (36.8 C)-99 F (37.2 C)] 98.7 F (37.1 C) (12/19 0753) Pulse Rate:  [78-91] 91 (12/19 0753) Resp:  [16-19] 19 (12/19 0753) BP: (127-136)/(86-104) 136/104 (12/19 0753) SpO2:  [94 %-100 %] 100 % (12/19 0753)  PHYSICAL EXAM:  General: Alert, cooperative, no distress, appears stated age.  Head: Normocephalic, without obvious abnormality, atraumatic. Eyes: Conjunctivae clear, anicteric sclerae. Pupils are equal ENT Nares normal. No drainage or sinus tenderness. Lips, mucosa, and tongue normal. No Thrush Neck: Supple, symmetrical, no adenopathy, thyroid: non tender no carotid bruit and no JVD. Back: No CVA tenderness. Lungs: Clear to auscultation bilaterally. No Wheezing or Rhonchi. No rales. Heart: Regular rate and rhythm, no murmur, rub or gallop. Abdomen: Soft, non-tender,not distended. Bowel  sounds normal. No masses Extremities: rt foot dressing not removed. Skin: No rashes or lesions. Or bruising Lymph: Cervical, supraclavicular normal. Neurologic: Grossly non-focal  Lab Results Recent Labs    11/18/21 0319  WBC 7.7  HGB 12.3*  HCT 37.6*  NA 133*  K 4.0  CL 101  CO2 26  BUN 17  CREATININE 0.86   Liver Panel No results for input(s): PROT, ALBUMIN, AST, ALT, ALKPHOS, BILITOT, BILIDIR, IBILI in the last 72 hours. Sedimentation Rate No results for input(s): ESRSEDRATE in the last 72 hours.   Microbiology: Staph lugdunensis     Assessment/Plan: Rt Ankle stabilization surgery-- Rt ankle surgical site infection with abscess soft tissue and septic arthritis- s/p arthroscopy and debridement/washout Culture staph lugdunensis from both bedside culture- and OR culture On vancomycin- changed to cefazolin Underwent repeat I/D and removal of all suture material and anchor from the surgery done on 10/6 Pt will need minimum 4-6 weeks of IV antibiotic    DM on insulin    HLD on atorvastatin   Hyogonadism on chronic testosterone   Erythrocytosis due to testosterone- has had multiple phlebotomies - followed by heme as Op  Discussed the management with the patient and his wife. PICC order placed OPAT orders written I will follow the patient as outpatient.

## 2021-11-18 NOTE — Consult Note (Signed)
PHARMACY CONSULT NOTE FOR:  OUTPATIENT  PARENTERAL ANTIBIOTIC THERAPY (OPAT)  Indication: Septic arthritis and osteomyelitis Regimen: Ancef 2 g q8H End date: 12/25/2021  IV antibiotic discharge orders are pended. To discharging provider:  please sign these orders via discharge navigator,  Select New Orders & click on the button choice - Manage This Unsigned Work.     Thank you for allowing pharmacy to be a part of this patient's care.  Oswald Hillock 11/18/2021, 6:36 PM

## 2021-11-18 NOTE — Progress Notes (Signed)
PROGRESS NOTE    Jonathan Fields  HQI:[id number] DOB: 01-Jan-1956 DOA: 11/10/2021 PCP: Virginia Crews, MD    Brief Narrative:  65 year old with history of type 2 diabetes, hypertension, obstructive sleep apnea, deformity of the right foot that he underwent destabilization on 13/2440 complicated by wound dehiscence and failed outpatient antibiotic therapy.  Admitted with right ankle pain, drainage and swelling for several days.  Found to have osteomyelitis with septic arthritis of the right ankle.  Followed by surgery and ID.   Assessment & Plan:   Principal Problem:   Postoperative cellulitis and abscess of surgical wound right ankle Active Problems:   Hypertension   Cavovarus deformity of foot, acquired, right   Erythrocytosis   Hyperglycemia due to type 2 diabetes mellitus (HCC)   Acute osteomyelitis of right ankle (HCC)   Septic arthritis of right ankle (HCC)   Synovitis of right ankle   Abscess of bursa, right ankle and foot  Postoperative wound infection, septic arthritis of the right ankle, acute osteomyelitis of the right ankle: Ankle stabilization 10/22 OR for washout and debridement 12/14, 12/17 and bone biopsy Surgical cultures with staph lugdunensis.  Followed by ID and podiatry.  Currently on Ancef. Anticipating PICC line and home infusion therapy, ID to make final decision.  Type 2 diabetes: Well-controlled today.  On insulin.  Hypertension: Blood pressure stable on lisinopril.   DVT prophylaxis: SCDs Start: 11/16/21 0953 enoxaparin (LOVENOX) injection 40 mg Start: 11/11/21 1200   Code Status: Full code Family Communication: None Disposition Plan: Status is: Inpatient  Remains inpatient appropriate because: Septic arthritis.         Consultants:  Podiatry Infectious disease  Procedures:  See above  Antimicrobials:  Ancef   Subjective: Patient seen and examined.  Moderate pain on mobility.  Mild pain 3-4 at rest. Is worried about  going back to work.  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 He does not like the idea of PICC line but he has no choice.  Objective: Vitals:   11/17/21 1627 11/17/21 1926 11/18/21 0524 11/18/21 0753  BP: (!) 127/92 (!) 136/95 135/86 (!) 136/104  Pulse: 81 78 81 91  Resp: 18 16 16 19   Temp: 98.2 F (36.8 C) 98.6 F (37 C) 99 F (37.2 C) 98.7 F (37.1 C)  TempSrc:  Oral Oral Oral  SpO2: 96% 95% 94% 100%  Weight:      Height:        Intake/Output Summary (Last 24 hours) at 11/18/2021 1205 Last data filed at 11/18/2021 0936 Gross per 24 hour  Intake 536.11 ml  Output 550 ml  Net -13.89 ml   Filed Weights   11/10/21 1045 11/13/21 1533  Weight: 102.1 kg 102.1 kg    Examination:  General exam: Appears calm and comfortable  Anxious. Respiratory system: Clear to auscultation. Respiratory effort normal. Cardiovascular system: S1 & S2 heard, RRR.  No edema.   Gastrointestinal system: Soft and nontender.  Bowel sound present. Central nervous system: Alert and oriented. No focal neurological deficits. Extremities: Symmetric 5 x 5 power. Right foot on postop dressing, not removed by me.  Visual part of the toes are clean and dry and pink.    Data Reviewed: I have personally reviewed following labs and imaging studies  CBC: Recent Labs  Lab 11/12/21 0242 11/13/21 0232 11/14/21 0149 11/18/21 0319  WBC 7.7 6.7 6.6 7.7  HGB 13.3 13.7 13.6 12.3*  HCT 41.4 42.5 42.0 37.6*  MCV 77.5* 76.7* 77.5* 77.5*  PLT 229 226 260 397   Basic Metabolic Panel: Recent Labs  Lab 11/12/21 0242 11/13/21 0232 11/14/21 0149 11/15/21 0457 11/18/21 0319  NA  --   --  134*  --  133*  K  --   --  3.9  --  4.0  CL  --   --  102  --  101  CO2  --   --  24  --  26  GLUCOSE  --   --  217*  --  175*  BUN  --   --  21  --  17  CREATININE 0.74 0.68 0.85 0.71 0.86  CALCIUM  --   --  8.4*  --  8.4*  MG  --   --  2.0  --   --    GFR: Estimated Creatinine Clearance:  106.3 mL/min (by C-G formula based on SCr of 0.86 mg/dL). Liver Function Tests: No results for input(s): AST, ALT, ALKPHOS, BILITOT, PROT, ALBUMIN in the last 168 hours. No results for input(s): LIPASE, AMYLASE in the last 168 hours. No results for input(s): AMMONIA in the last 168 hours. Coagulation Profile: No results for input(s): INR, PROTIME in the last 168 hours. Cardiac Enzymes: No results for input(s): CKTOTAL, CKMB, CKMBINDEX, TROPONINI in the last 168 hours. BNP (last 3 results) No results for input(s): PROBNP in the last 8760 hours. HbA1C: No results for input(s): HGBA1C in the last 72 hours. CBG: Recent Labs  Lab 11/17/21 0748 11/17/21 1137 11/17/21 1629 11/17/21 2105 11/18/21 0755  GLUCAP 113* 170* 166* 92 149*   Lipid Profile: No results for input(s): CHOL, HDL, LDLCALC, TRIG, CHOLHDL, LDLDIRECT in the last 72 hours. Thyroid Function Tests: No results for input(s): TSH, T4TOTAL, FREET4, T3FREE, THYROIDAB in the last 72 hours. Anemia Panel: No results for input(s): VITAMINB12, FOLATE, FERRITIN, TIBC, IRON, RETICCTPCT in the last 72 hours. Sepsis Labs: No results for input(s): PROCALCITON, LATICACIDVEN in the last 168 hours.  Recent Results (from the past 240 hour(s))  Culture, blood (routine x 2)     Status: None (Preliminary result)   Collection Time: 11/10/21  3:34 PM   Specimen: BLOOD  Result Value Ref Range Status   Specimen Description BLOOD RIGHT ANTECUBITAL  Final   Special Requests   Final    BOTTLES DRAWN AEROBIC AND ANAEROBIC Blood Culture adequate volume   Culture   Final    NO GROWTH 4 DAYS Performed at [facility], [address] [place], [place] [address]    Report Status PENDING  Incomplete  Culture, blood (routine x 2)     Status: None (Preliminary result)   Collection Time: 11/10/21  3:39 PM   Specimen: BLOOD  Result Value Ref Range Status   Specimen Description BLOOD BLOOD RIGHT FOREARM  Final   Special Requests   Final     BOTTLES DRAWN AEROBIC AND ANAEROBIC Blood Culture adequate volume   Culture   Final    NO GROWTH 4 DAYS Performed at [facility], [address] [place], [place] [address]    Report Status PENDING  Incomplete  Resp Panel by RT-PCR (Flu A&B, Covid) Nasopharyngeal Swab     Status: None   Collection Time: 11/11/21  1:41 AM   Specimen: Nasopharyngeal Swab; Nasopharyngeal(NP) swabs in vial transport medium  Result Value Ref Range Status   SARS Coronavirus 2 by RT PCR NEGATIVE NEGATIVE Final    Comment: (NOTE) SARS-CoV-2 target nucleic acids are NOT  DETECTED.  The SARS-CoV-2 RNA is generally detectable in upper respiratory specimens during the acute phase of infection. The lowest concentration of SARS-CoV-2 viral copies this assay can detect is 138 copies/mL. A negative result does not preclude SARS-Cov-2 infection and should not be used as the sole basis for treatment or other patient management decisions. A negative result may occur with  improper specimen collection/handling, submission of specimen other than nasopharyngeal swab, presence of viral mutation(s) within the areas targeted by this assay, and inadequate number of viral copies(<138 copies/mL). A negative result must be combined with clinical observations, patient history, and epidemiological information. The expected result is Negative.  Fact Sheet for Patients:  EntrepreneurPulse.com.au  Fact Sheet for Healthcare Providers:  IncredibleEmployment.be  This test is no t yet approved or cleared by the [place] FDA and  has been authorized for detection and/or diagnosis of SARS-CoV-2 by FDA under an Emergency Use Authorization (EUA). This EUA will remain  in effect (meaning this test can be used) for the duration of the COVID-19 declaration under Section 564(b)(1) of the Act, 21 U.S.C.section 360bbb-3(b)(1), unless the authorization is terminated  or revoked sooner.        Influenza A by PCR NEGATIVE NEGATIVE Final   Influenza B by PCR NEGATIVE NEGATIVE Final    Comment: (NOTE) The Xpert Xpress SARS-CoV-2/FLU/RSV plus assay is intended as an aid in the diagnosis of influenza from Nasopharyngeal swab specimens and should not be used as a sole basis for treatment. Nasal washings and aspirates are unacceptable for Xpert Xpress SARS-CoV-2/FLU/RSV testing.  Fact Sheet for Patients: EntrepreneurPulse.com.au  Fact Sheet for Healthcare Providers: IncredibleEmployment.be  This test is not yet approved or cleared by the [place] FDA and has been authorized for detection and/or diagnosis of SARS-CoV-2 by FDA under an Emergency Use Authorization (EUA). This EUA will remain in effect (meaning this test can be used) for the duration of the COVID-19 declaration under Section 564(b)(1) of the Act, 21 U.S.C. section 360bbb-3(b)(1), unless the authorization is terminated or revoked.  Performed at [facility], [address] [place], [place] [address]   Aerobic/Anaerobic Culture w Gram Stain (surgical/deep wound)     Status: None   Collection Time: 11/11/21  7:34 AM   Specimen: Abscess  Result Value Ref Range Status   Specimen Description   Final    ABSCESS Performed at [facility], [address], [place], [place] [address]    Special Requests   Final    NONE Performed at [facility], [address] [place], [place] [address]    Gram Stain   Final    FEW WBC PRESENT,BOTH PMN AND MONONUCLEAR NO ORGANISMS SEEN    Culture   Final    RARE STAPHYLOCOCCUS LUGDUNENSIS NO ANAEROBES ISOLATED Performed at [facility], [place] [address] [place], [place] [address]    Report Status 11/16/2021 FINAL  Final   Organism ID, Bacteria STAPHYLOCOCCUS LUGDUNENSIS  Final      Susceptibility   Staphylococcus lugdunensis - MIC*    CIPROFLOXACIN <=0.5 SENSITIVE Sensitive     ERYTHROMYCIN >=8  RESISTANT Resistant     GENTAMICIN <=0.5 SENSITIVE Sensitive     OXACILLIN 2 SENSITIVE Sensitive     TETRACYCLINE <=1 SENSITIVE Sensitive     VANCOMYCIN <=0.5 SENSITIVE Sensitive     TRIMETH/SULFA <=10 SENSITIVE Sensitive     CLINDAMYCIN >=8 RESISTANT Resistant     RIFAMPIN <=0.5 SENSITIVE Sensitive     Inducible Clindamycin NEGATIVE Sensitive     *  RARE STAPHYLOCOCCUS LUGDUNENSIS  Aerobic/Anaerobic Culture w Gram Stain (surgical/deep wound)     Status: None (Preliminary result)   Collection Time: 11/13/21  4:58 PM   Specimen: PATH Soft tissue  Result Value Ref Range Status   Specimen Description   Final    TISSUE Performed at [facility], [address], [place], [place] [address]    Special Requests   Final    SOFT TISSUE Performed at [facility], [address] [place], [place] [address]    Gram Stain   Final    MODERATE WBC PRESENT,BOTH PMN AND MONONUCLEAR NO ORGANISMS SEEN Performed at [facility], [place] [address] [place], [place] [address]    Culture   Final    RARE STAPHYLOCOCCUS LUGDUNENSIS NO ANAEROBES ISOLATED; CULTURE IN PROGRESS FOR 5 DAYS    Report Status PENDING  Incomplete   Organism ID, Bacteria STAPHYLOCOCCUS LUGDUNENSIS  Final      Susceptibility   Staphylococcus lugdunensis - MIC*    CIPROFLOXACIN <=0.5 SENSITIVE Sensitive     ERYTHROMYCIN >=8 RESISTANT Resistant     GENTAMICIN <=0.5 SENSITIVE Sensitive     OXACILLIN >=4 RESISTANT Resistant     TETRACYCLINE <=1 SENSITIVE Sensitive     VANCOMYCIN <=0.5 SENSITIVE Sensitive     TRIMETH/SULFA <=10 SENSITIVE Sensitive     CLINDAMYCIN >=8 RESISTANT Resistant     RIFAMPIN <=0.5 SENSITIVE Sensitive     Inducible Clindamycin NEGATIVE Sensitive     * RARE STAPHYLOCOCCUS LUGDUNENSIS  Aerobic/Anaerobic Culture w Gram Stain (surgical/deep wound)     Status: None (Preliminary result)   Collection Time: 11/16/21  9:12 AM   Specimen: Abscess  Result Value Ref Range Status   Specimen  Description   Final    ABSCESS Performed at [facility], [address], [place], [place] [address]    Special Requests   Final    talus bone culture A RIGHT ANKLE Performed at [facility], [address] [place], [place] [address]    Gram Stain NO WBC SEEN NO ORGANISMS SEEN   Final   Culture   Final    NO GROWTH < 24 HOURS Performed at [facility], [place] [address], [place], [place] [address]    Report Status PENDING  Incomplete  Aerobic/Anaerobic Culture w Gram Stain (surgical/deep wound)     Status: None (Preliminary result)   Collection Time: 11/16/21  9:26 AM   Specimen: Abscess  Result Value Ref Range Status   Specimen Description   Final    ABSCESS Performed at [facility], [address] [place], [place] [address]    Special Requests   Final    RT ANKLE FIBULA BONE CULTURE B Performed at [facility], [place], [place] [address]    Gram Stain NO WBC SEEN NO ORGANISMS SEEN   Final   Culture   Final    NO GROWTH < 24 HOURS Performed at [facility], [place] [address] [place],  [address]    Report Status PENDING  Incomplete         Radiology Studies: No results found.      Scheduled Meds:  acetaminophen  1,000 mg Oral Q8H   Or   acetaminophen  650 mg Rectal Q8H   aspirin EC  81 mg Oral Daily   atorvastatin  40 mg Oral Daily   enoxaparin (LOVENOX) injection  40 mg Subcutaneous Q24H   insulin aspart  0-15  Units Subcutaneous TID WC   insulin aspart  5 Units Subcutaneous TID WC   insulin glargine-yfgn  10 Units Subcutaneous Daily   lisinopril  40 mg Oral Daily   pantoprazole  40 mg Oral Daily   senna-docusate  1 tablet Oral QHS   Continuous Infusions:  sodium chloride Stopped (11/18/21 0410)    ceFAZolin (ANCEF) IV Stopped (11/18/21 0440)     LOS: 8 days    Time spent: 25 minutes    Barb Merino, MD [facility] Pager [phone]

## 2021-11-19 DIAGNOSIS — M86171 Other acute osteomyelitis, right ankle and foot: Secondary | ICD-10-CM | POA: Diagnosis not present

## 2021-11-19 LAB — GLUCOSE, CAPILLARY
Glucose-Capillary: 193 mg/dL — ABNORMAL HIGH (ref 70–99)
Glucose-Capillary: 183 mg/dL — ABNORMAL HIGH (ref 70–99)

## 2021-11-19 LAB — AEROBIC/ANAEROBIC CULTURE W GRAM STAIN (SURGICAL/DEEP WOUND): Gram Stain: NONE SEEN

## 2021-11-19 MED ORDER — LACTULOSE 10 GM/15ML PO SOLN
20.0000 g | Freq: Two times a day (BID) | ORAL | 0 refills | Status: DC | PRN
Start: 2021-11-19 — End: 2022-06-02

## 2021-11-19 MED ORDER — CEFAZOLIN IV (FOR PTA / DISCHARGE USE ONLY): 2.0000 g | Freq: Three times a day (TID) | INTRAVENOUS | 0 refills | Status: DC

## 2021-11-19 MED ORDER — SENNOSIDES-DOCUSATE SODIUM 8.6-50 MG PO TABS
1.0000 | ORAL_TABLET | Freq: Every day | ORAL | 0 refills | Status: AC
Start: 2021-11-19 — End: 2021-12-19

## 2021-11-19 MED ORDER — SODIUM CHLORIDE 0.9% FLUSH: 10.0000 mL | Freq: Two times a day (BID) | INTRAVENOUS | Status: DC

## 2021-11-19 MED ORDER — OXYCODONE HCL 5 MG PO TABS: 5.0000 mg | ORAL_TABLET | ORAL | 0 refills | Status: AC | PRN

## 2021-11-19 MED ORDER — SODIUM CHLORIDE 0.9% FLUSH: 10.0000 mL | INTRAVENOUS | Status: DC | PRN

## 2021-11-19 MED ORDER — CHLORHEXIDINE GLUCONATE CLOTH 2 % EX PADS
6.0000 | MEDICATED_PAD | Freq: Every day | CUTANEOUS | Status: DC
  Administered 2021-11-19: 13:00:00 6 via TOPICAL

## 2021-11-19 NOTE — TOC Transition Note (Signed)
Transition of Care [facility]) - CM/SW Discharge Note   Patient Details  Name: SHALOM MCGUINESS MRN: [id number] Date of Birth: Jan 21, 1956  [facility]) CM/SW Contact:  Beverly Sessions, RN Phone Number: 11/19/2021, 2:33 PM   Clinical Narrative:    Patient to discharge today.  Pam with advanced infusion has completed education at bedside with wife and patient.   Helms to provide start of care in the morning  Per MD dressing changes will not be indicated at discharge   Patient has opted against Rw.  Referral made to [facility] and [facility] for discharge          Patient Goals and CMS Choice        Discharge Placement                       Discharge Plan and Services                                     Social Determinants of Health (SDOH) Interventions     Readmission Risk Interventions No flowsheet data found.

## 2021-11-19 NOTE — Progress Notes (Signed)
Physical Therapy Treatment Patient Details Name: Jonathan Fields MRN: [id number] DOB: [birth year] Today's Date: 11/19/2021   History of Present Illness pt is a 65 y.o. male that presented to ED for continued pain and tenderness of R ankle.  RT ankle incision and drainage 11/13/2021 secondary to septic joint from complication of lateral ankle stabilization procedure 09/05/2021. Scheduled for I&D 12/17.  PMH of TB, DM, GERD, HLD, HTN, sleep apnea    PT Comments    OOB and is able to walk to door and back  NWB with crutches.  Stated he has been up with staff to bathroom with no difficulty.  Navigates well with crutches and stated he prefers them at this time.  Declined stair training stating he feels good about them.  Pt stated he hopes to discharge home today.   Recommendations for follow up therapy are one component of a multi-disciplinary discharge planning process, led by the attending physician.  Recommendations may be updated based on patient status, additional functional criteria and insurance authorization.  Follow Up Recommendations  Outpatient PT     Assistance Recommended at Discharge Intermittent Supervision/Assistance  Equipment Recommendations  Crutches (extra tall crutches)    Recommendations for Other Services       Precautions / Restrictions Precautions Precautions: Fall Restrictions Weight Bearing Restrictions: Yes RLE Weight Bearing: Non weight bearing     Mobility  Bed Mobility Overal bed mobility: Independent                  Transfers Overall transfer level: Modified independent Equipment used: Crutches Transfers: Sit to/from Stand Sit to Stand: Supervision;Modified independent (Device/Increase time)                Ambulation/Gait Ambulation/Gait assistance: Modified independent (Device/Increase time);Supervision Gait Distance (Feet): 20 Feet Assistive device: Crutches Gait Pattern/deviations: Step-to pattern           Stairs              Wheelchair Mobility    Modified Rankin (Stroke Patients Only)       Balance Overall balance assessment: Needs assistance Sitting-balance support: Feet supported Sitting balance-Leahy Scale: Normal       Standing balance-Leahy Scale: Fair Standing balance comment: reliant on UE support for any dynamic activties/ambulation                            Cognition Arousal/Alertness: Awake/alert Behavior During Therapy: WFL for tasks assessed/performed Overall Cognitive Status: Within Functional Limits for tasks assessed                                          Exercises      General Comments        Pertinent Vitals/Pain Pain Assessment: No/denies pain    Home Living                          Prior Function            PT Goals (current goals can now be found in the care plan section) Progress towards PT goals: Progressing toward goals    Frequency    Min 2X/week      PT Plan Current plan remains appropriate;Equipment recommendations need to be updated    Co-evaluation  AM-PAC PT "6 Clicks" Mobility   Outcome Measure  Help needed turning from your back to your side while in a flat bed without using bedrails?: None Help needed moving from lying on your back to sitting on the side of a flat bed without using bedrails?: None Help needed moving to and from a bed to a chair (including a wheelchair)?: None Help needed standing up from a chair using your arms (e.g., wheelchair or bedside chair)?: None Help needed to walk in hospital room?: A Little Help needed climbing 3-5 steps with a railing? : A Little 6 Click Score: 22    End of Session   Activity Tolerance: Patient tolerated treatment well;Patient limited by pain Patient left: in bed;with call bell/phone within reach;with bed alarm set Nurse Communication: Mobility status PT Visit Diagnosis: Other abnormalities of gait and mobility  (R26.89);Difficulty in walking, not elsewhere classified (R26.2);Muscle weakness (generalized) (M62.81);Pain Pain - Right/Left: Right Pain - part of body: Ankle and joints of foot     Time: 1145-1154 PT Time Calculation (min) (ACUTE ONLY): 9 min  Charges:  $Gait Training: 8-22 mins                    Chesley Noon, PTA 11/19/21, 1:27 PM

## 2021-11-19 NOTE — Progress Notes (Signed)
Peripherally Inserted Central Catheter Placement  The IV Nurse has discussed with the patient and/or persons authorized to consent for the patient, the purpose of this procedure and the potential benefits and risks involved with this procedure.  The benefits include less needle sticks, lab draws from the catheter, and the patient may be discharged home with the catheter. Risks include, but not limited to, infection, bleeding, blood clot (thrombus formation), and puncture of an artery; nerve damage and irregular heartbeat and possibility to perform a PICC exchange if needed/ordered by physician.  Alternatives to this procedure were also discussed.  Bard Power PICC patient education guide, fact sheet on infection prevention and patient information card has been provided to patient /or left at bedside.    PICC Placement Documentation  PICC Single Lumen 20/94/70 Right Basilic 43 cm 0 cm (Active)  Indication for Insertion or Continuance of Line Home intravenous therapies (PICC only) 11/19/21 1249  Exposed Catheter (cm) 0 cm 11/19/21 1249  Site Assessment Clean;Dry;Intact 11/19/21 1249  Line Status Flushed;Saline locked;Blood return noted 11/19/21 1249  Dressing Type Transparent 11/19/21 1249  Dressing Status Clean;Dry;Intact 11/19/21 1249  Antimicrobial disc in place? Yes 11/19/21 1249  Dressing Intervention New dressing;Other (Comment) 11/19/21 1249  Dressing Change Due 11/26/21 11/19/21 1249       Christella Noa Albarece 11/19/2021, 12:51 PM

## 2021-11-19 NOTE — Discharge Summary (Signed)
Physician Discharge Summary  Jonathan Fields ZOX:[id number] DOB: Aug 11, 1956 DOA: 11/10/2021  PCP: Virginia Crews, MD  Admit date: 11/10/2021 Discharge date: 11/19/2021  Admitted From: Home Disposition: Home with home health, home infusion therapy  Recommendations for Outpatient Follow-up:  Home health infusion therapy, screening lab with infectious disease clinic Podiatry surgery follow-up  Home Health: PT/OT/RN Equipment/Devices: Rolling walker  Discharge Condition: Stable CODE STATUS: Full code Diet recommendation: Low-carb diet  Discharge summary: 65 year old with history of type 2 diabetes, hypertension, obstructive sleep apnea, deformity of the right foot that underwent stabilization on 81/1914 complicated by wound dehiscence and failed outpatient antibiotic therapy.  Admitted with right ankle pain, drainage and swelling for several days.  Found to have osteomyelitis with septic arthritis of the right ankle.  Followed by surgery and ID.    Postoperative wound infection, septic arthritis of the right ankle, acute osteomyelitis of the right ankle: Ankle stabilization 10/22 OR for washout and debridement 12/14, 12/17 and bone biopsy Surgical cultures with staph lugdunensis.  Followed by ID and podiatry.  Currently on Ancef. Discharging home with PICC line and IV Ancef for 6 weeks. Home infusion therapy set up. Nonweightbearing right ankle. Dressing changes in podiatry office 2 times a week. Adequate pain medications.  Short course of opiates prescribed.   Type 2 diabetes with hyperglycemia: A1c 9. Patient on metformin at home continued. Patient was just prescribed Farxiga 5 mg by his PCP, not taken yet.  He will resume this.   Hypertension: Blood pressure stable on lisinopril.  Stable to discharge after arrangements of home antibiotic therapy today.    Discharge Diagnoses:  Principal Problem:   Postoperative cellulitis and abscess of surgical wound right  ankle Active Problems:   Hypertension   Cavovarus deformity of foot, acquired, right   Erythrocytosis   Hyperglycemia due to type 2 diabetes mellitus ([place])   Acute osteomyelitis of right ankle (HCC)   Septic arthritis of right ankle (HCC)   Synovitis of right ankle   Abscess of bursa, right ankle and foot    Discharge Instructions  Discharge Instructions     [facility] pharmacist to adjust dose for Vancomycin, Aminoglycosides and other anti-infective therapies as requested by physician.   Complete by: As directed    [facility] to provide Cath Flo 59m   Complete by: As directed    Administer for PICC line occlusion and as ordered by physician for other access device issues.   Anaphylaxis Kit: Provided to treat any anaphylactic reaction to the medication being provided to the patient if First Dose or when requested by physician   Complete by: As directed    Epinephrine 170mml vial / amp: Administer 0.55m78m0.55ml31mubcutaneously once for moderate to severe anaphylaxis, nurse to call physician and pharmacy when reaction occurs and call 911 if needed for immediate care   Diphenhydramine 50mg77mIV vial: Administer 25-50mg 86mM PRN for first dose reaction, rash, itching, mild reaction, nurse to call physician and pharmacy when reaction occurs   Sodium Chloride 0.9% NS 500ml I27mdminister if needed for hypovolemic blood pressure drop or as ordered by physician after call to physician with anaphylactic reaction   Call MD for:  severe uncontrolled pain   Complete by: As directed    Call MD for:  temperature >100.4   Complete by: As directed    Change dressing on IV access line weekly and PRN   Complete by: As directed    Consult to hospitalist   Complete  by: As directed    Diet Carb Modified   Complete by: As directed    Flush IV access with Sodium Chloride 0.9% and Heparin 10 units/ml or 100 units/ml   Complete by: As directed    Home infusion instructions -  [facility]   Complete by: As directed    Instructions: Flush IV access with Sodium Chloride 0.9% and Heparin 10units/ml or 100units/ml   Change dressing on IV access line: Weekly and PRN   Instructions Cath Flo 26m: Administer for PICC Line occlusion and as ordered by physician for other access device   [facility] pharmacist to adjust dose for: Vancomycin, Aminoglycosides and other anti-infective therapies as requested by physician   Increase activity slowly   Complete by: As directed    Leave dressing on - Keep it clean, dry, and intact until clinic visit   Complete by: As directed    Method of administration may be changed at the discretion of home infusion pharmacist based upon assessment of the patient and/or caregivers ability to self-administer the medication ordered   Complete by: As directed       Allergies as of 11/19/2021       Reactions   Penicillins Hives   Has patient had a PCN reaction causing immediate rash, facial/tongue/throat swelling, SOB or lightheadedness with hypotension: yes Has patient had a PCN reaction causing severe rash involving mucus membranes or skin necrosis: no Has patient had a PCN reaction that required hospitalization: no Has patient had a PCN reaction occurring within the last 10 years: no If all of the above answers are "NO", then may proceed with Cephalosporin use.        Medication List     TAKE these medications    BD SafetyGlide Shielded Needle 21G X 1-1/2" Misc Generic drug: NEEDLE (DISP) 21 G Use 21guage needle to inject Testosterone Cypionate 113m   ceFAZolin  IVPB Commonly known as: ANCEF Inject 2 g into the vein every 8 (eight) hours. Indication:  septic arthritis and osteomyelitis First Dose: Yes Last Day of Therapy:  12/25/2021 Labs - Once weekly:  CBC/D, CMP,CRP, ESR Method of administration: IV Push Method of administration may be changed at the discretion of home infusion pharmacist based upon  assessment of the patient and/or caregiver's ability to self-administer the medication ordered.   diphenhydramine-acetaminophen 25-500 MG Tabs tablet Commonly known as: TYLENOL PM Take 1 tablet by mouth at bedtime as needed.   Farxiga 5 MG Tabs tablet Generic drug: dapagliflozin propanediol Take 5 mg by mouth daily.   fluticasone 50 MCG/ACT nasal spray Commonly known as: FLONASE 1-2 sprays daily as needed.   hydrochlorothiazide 12.5 MG tablet Commonly known as: HYDRODIURIL Take 1 tablet (12.5 mg total) by mouth daily.   lactulose 10 GM/15ML solution Commonly known as: CHRONULAC Take 30 mLs (20 g total) by mouth 2 (two) times daily as needed for mild constipation or moderate constipation.   lisinopril 40 MG tablet Commonly known as: ZESTRIL TAKE 1 TABLET BY MOUTH DAILY   meloxicam 15 MG tablet Commonly known as: MOBIC Take 15 mg by mouth daily.   metFORMIN 500 MG 24 hr tablet Commonly known as: Glucophage XR Take 2 tablets (1,000 mg total) by mouth in the morning and at bedtime.   omeprazole 20 MG capsule Commonly known as: PRILOSEC Take 20 mg by mouth daily.   oxyCODONE 5 MG immediate release tablet Commonly known as: Oxy IR/ROXICODONE Take 1 tablet (5 mg total) by mouth every  4 (four) hours as needed for up to 5 days for severe pain.   Protein Powd Take 1 Scoop by mouth daily.   senna-docusate 8.6-50 MG tablet Commonly known as: Senokot-S Take 1 tablet by mouth at bedtime.   testosterone cypionate 200 MG/ML injection Commonly known as: DEPOTESTOSTERONE CYPIONATE INJECT 0.5 ML IN THE MUSCLE EVERY WEEK               Durable Medical Equipment  (From admission, onward)           Start     Ordered   11/19/21 1429  For home use only DME Bedside commode  Once       Question:  Patient needs a bedside commode to treat with the following condition  Answer:  Weakness   11/19/21 1428   11/19/21 1428  For home use only DME lightweight manual wheelchair with  seat cushion  Once       Comments: Patient suffers from surgical pain which impairs their ability to perform daily activities like toileting in the home.  A walker will not resolve  issue with performing activities of daily living. A wheelchair will allow patient to safely perform daily activities. Patient is not able to propel themselves in the home using a standard weight wheelchair due to endurance. Patient can self propel in the lightweight wheelchair. Length of need 12 months . Accessories: elevating leg rests (ELRs), wheel locks, extensions and anti-tippers.   11/19/21 1428   11/19/21 1038  For home use only DME Crutches  Once       Comments: Extra tall   11/19/21 1037   11/19/21 1038  For home use only DME Walker rolling  Once       Question Answer Comment  Walker: With [place]   Patient needs a walker to treat with the following condition Weakness      11/19/21 1037              Discharge Care Instructions  (From admission, onward)           Start     Ordered   11/19/21 0000  Change dressing on IV access line weekly and PRN  (Home infusion instructions - [facility] )        11/19/21 1043   11/19/21 0000  Leave dressing on - Keep it clean, dry, and intact until clinic visit        11/19/21 1436            Allergies  Allergen Reactions   Penicillins Hives    Has patient had a PCN reaction causing immediate rash, facial/tongue/throat swelling, SOB or lightheadedness with hypotension: yes Has patient had a PCN reaction causing severe rash involving mucus membranes or skin necrosis: no Has patient had a PCN reaction that required hospitalization: no Has patient had a PCN reaction occurring within the last 10 years: no If all of the above answers are "NO", then may proceed with Cephalosporin use.     Consultations: Podiatry Infectious disease   Procedures/Studies: DG Ankle Complete Right  Result Date: 11/10/2021 CLINICAL DATA:  Right  ankle affection. EXAM: RIGHT ANKLE - COMPLETE 3+ VIEW COMPARISON:  None. FINDINGS: No fracture or dislocation is noted. Mild osteophyte formation is seen involving the talotibial joint. Soft tissue swelling is seen overlying the lateral malleolus. Vascular calcifications are noted. IMPRESSION: Soft tissue swelling is noted overlying lateral malleolus. No acute bony abnormality is noted. Electronically Signed   By: Jeneen Rinks  Murlean Caller M.D.   On: 11/10/2021 11:39   CT Ankle Right Wo Contrast  Result Date: 11/10/2021 CLINICAL DATA:  Ankle pain. Right ankle soft tissue swelling and redness. EXAM: CT OF THE RIGHT ANKLE WITHOUT CONTRAST TECHNIQUE: Multidetector CT imaging of the right ankle was performed according to the standard protocol. Multiplanar CT image reconstructions were also generated. COMPARISON:  Radiograph performed on the same date. FINDINGS: Bones/Joint/Cartilage Postsurgical changes with two thicker smoker screws in the calcaneus. No acute fracture or dislocation. Arthropathy of the tibiotalar and talonavicular joints with osteophytes. Partially imaged hardware in the first metatarsal. Ligaments Suboptimally assessed by CT. Muscles and Tendons Muscles are normal in bulk.  No evidence of acute tendon tear. Soft tissues Marked subcutaneous soft tissue edema with a peripherally enhancing collection about the lateral aspect of the ankle measuring at least 5.6 x 2.9 by 3.7 cm, likely representing abscess. IMPRESSION: 1. Peripherally enhancing fluid collection in the subcutaneous soft tissues on the lateral aspect of the ankle measuring at least 5.6 x 2.9 x 3.7 cm. Generalized subcutaneous soft tissue edema about the ankle, concerning for cellulitis. 2. Postsurgical changes about the calcaneus without evidence of acute fracture. 3.  Arthropathy of the tibiotalar and talonavicular joints. Electronically Signed   By: Keane Police D.O.   On: 11/10/2021 16:44   MR ANKLE RIGHT W WO CONTRAST  Result Date:  11/11/2021 CLINICAL DATA:  Multiple right ankle surgeries. Pain and swelling around the right ankle. EXAM: MRI OF THE RIGHT ANKLE WITHOUT AND WITH CONTRAST TECHNIQUE: Multiplanar, multisequence MR imaging of the ankle was performed before and after the administration of intravenous contrast. CONTRAST:  36m GADAVIST GADOBUTROL 1 MMOL/ML IV SOLN COMPARISON:  CT right ankle 11/10/2021 FINDINGS: Susceptibility artifact resulting from the orthopedic hardware in the calcaneus partially obscures adjacent soft tissue and osseous structures. TENDONS Peroneal: Peroneal longus tendon intact. Peroneal brevis intact. Posteromedial: Posterior tibial tendon intact. Flexor hallucis longus tendon intact. Flexor digitorum longus tendon intact. Moderate amount of fluid in the flexor hallucis tendon sheath at the level of the tibiotalar joint. Anterior: Tibialis anterior tendon intact. Extensor hallucis longus tendon intact Extensor digitorum longus tendon intact. Achilles:  Intact. Plantar Fascia: Intact. LIGAMENTS Lateral: Chronic tear of the anterior talofibular ligament and calcaneofibular ligament. Prior injury of the posterior talofibular ligament without a complete tear. Anterior and posterior tibiofibular ligaments intact. Postsurgical changes from prior anterior talofibular ligament repair which appears torn. Medial: Not well visualized secondary to susceptibility artifact. CARTILAGE Ankle Joint: Moderate ankle joint effusion. Peripherally enhancing 3.6 x 2.1 x 4 cm fluid collection in the soft tissues along the anterolateral aspect of the ankle joint abutting the lateral malleolus and contiguous with the tibiotalar joint space most concerning for an abscess and septic arthritis. Partial-thickness cartilage loss of the tibiotalar joint. Subtalar Joints/Sinus Tarsi: Normal subtalar joints. No subtalar joint effusion. Normal sinus tarsi. Bones: No acute fracture or dislocation. Complex fluid collection abuts the lateral  malleolus with subcortical bone marrow edema concerning for osteomyelitis. Arthrodesis of the first TMT joint. Healed calcaneal osteotomy with orthopedic hardware in place. Mild osteoarthritis of the talonavicular joint and calcaneocuboid joint. Soft Tissue: Generalized soft tissue edema around the ankle and dorsal foot concerning for cellulitis. Muscles are normal without edema or atrophy. Tarsal tunnel is normal. IMPRESSION: IMPRESSION 1. Moderate ankle joint effusion. Peripherally enhancing 3.6 x 2.1 x 4 cm fluid collection in the soft tissues along the anterolateral aspect of the ankle joint abutting the lateral malleolus and contiguous with the  tibiotalar joint space most concerning for an abscess and septic arthritis. Complex fluid collection abuts the lateral malleolus with subcortical bone marrow edema concerning for osteomyelitis. 2. Generalized soft tissue edema around the ankle and dorsal foot concerning for cellulitis. Electronically Signed   By: Kathreen Devoid M.D.   On: 11/11/2021 07:46   DG Foot Complete Right  Result Date: 11/10/2021 CLINICAL DATA:  Right foot pain and swelling. EXAM: RIGHT FOOT COMPLETE - 3+ VIEW COMPARISON:  June 11, 2021. FINDINGS: There is no evidence of fracture or dislocation. Postsurgical changes are seen involving the calcaneus and first metatarsal. Soft tissues are unremarkable. IMPRESSION: No acute abnormality seen. Electronically Signed   By: Marijo Conception M.D.   On: 11/10/2021 11:37   [place] EKG SITE RITE  Result Date: 11/18/2021 If Site Rite image not attached, placement could not be confirmed due to current cardiac rhythm.  (Echo, Carotid, EGD, Colonoscopy, ERCP)    Subjective: Patient seen and examined.  Wife at the bedside.  Eager to go home.  Received a PICC line.   Discharge Exam: Vitals:   11/19/21 0428 11/19/21 0824  BP: (!) 153/88 (!) 144/80  Pulse: 85 97  Resp: 20 19  Temp: 98.4 F (36.9 C) 98 F (36.7 C)  SpO2: 96% 100%   Vitals:    11/18/21 1518 11/18/21 2029 11/19/21 0428 11/19/21 0824  BP: 115/69 134/86 (!) 153/88 (!) 144/80  Pulse: 78 82 85 97  Resp: 19 20 20 19   Temp: 98.3 F (36.8 C) 98.4 F (36.9 C) 98.4 F (36.9 C) 98 F (36.7 C)  TempSrc: Oral Oral Oral Oral  SpO2: 99% 98% 96% 100%  Weight:      Height:        General: Pt is alert, awake, not in acute distress Cardiovascular: RRR, S1/S2 +, no rubs, no gallops Respiratory: CTA bilaterally, no wheezing, no rhonchi Abdominal: Soft, NT, ND, bowel sounds + Extremities: no edema, no cyanosis Right foot on postsurgical dressing.  Distal neurovascular status.  Dressing not removed by me.    The results of significant diagnostics from this hospitalization (including imaging, microbiology, ancillary and laboratory) are listed below for reference.     Microbiology: Recent Results (from the past 240 hour(s))  Culture, blood (routine x 2)     Status: None (Preliminary result)   Collection Time: 11/10/21  3:34 PM   Specimen: BLOOD  Result Value Ref Range Status   Specimen Description BLOOD RIGHT ANTECUBITAL  Final   Special Requests   Final    BOTTLES DRAWN AEROBIC AND ANAEROBIC Blood Culture adequate volume   Culture   Final    NO GROWTH 4 DAYS Performed at [facility], [address], [place], [place] [address]    Report Status PENDING  Incomplete  Culture, blood (routine x 2)     Status: None (Preliminary result)   Collection Time: 11/10/21  3:39 PM   Specimen: BLOOD  Result Value Ref Range Status   Specimen Description BLOOD BLOOD RIGHT FOREARM  Final   Special Requests   Final    BOTTLES DRAWN AEROBIC AND ANAEROBIC Blood Culture adequate volume   Culture   Final    NO GROWTH 4 DAYS Performed at [facility], [address] [place],  [address]    Report Status PENDING  Incomplete  Resp Panel by RT-PCR (Flu A&B, Covid) Nasopharyngeal Swab     Status: None   Collection Time: 11/11/21  1:41 AM   Specimen:  Nasopharyngeal Swab; Nasopharyngeal(NP) swabs in vial transport medium  Result Value Ref Range Status   SARS Coronavirus 2 by RT PCR NEGATIVE NEGATIVE Final    Comment: (NOTE) SARS-CoV-2 target nucleic acids are NOT DETECTED.  The SARS-CoV-2 RNA is generally detectable in upper respiratory specimens during the acute phase of infection. The lowest concentration of SARS-CoV-2 viral copies this assay can detect is 138 copies/mL. A negative result does not preclude SARS-Cov-2 infection and should not be used as the sole basis for treatment or other patient management decisions. A negative result may occur with  improper specimen collection/handling, submission of specimen other than nasopharyngeal swab, presence of viral mutation(s) within the areas targeted by this assay, and inadequate number of viral copies(<138 copies/mL). A negative result must be combined with clinical observations, patient history, and epidemiological information. The expected result is Negative.  Fact Sheet for Patients:  EntrepreneurPulse.com.au  Fact Sheet for Healthcare Providers:  IncredibleEmployment.be  This test is no t yet approved or cleared by the [place] FDA and  has been authorized for detection and/or diagnosis of SARS-CoV-2 by FDA under an Emergency Use Authorization (EUA). This EUA will remain  in effect (meaning this test can be used) for the duration of the COVID-19 declaration under Section 564(b)(1) of the Act, 21 U.S.C.section 360bbb-3(b)(1), unless the authorization is terminated  or revoked sooner.       Influenza A by PCR NEGATIVE NEGATIVE Final   Influenza B by PCR NEGATIVE NEGATIVE Final    Comment: (NOTE) The Xpert Xpress SARS-CoV-2/FLU/RSV plus assay is intended as an aid in the diagnosis of influenza from Nasopharyngeal swab specimens and should not be used as a sole basis for treatment. Nasal washings and aspirates are unacceptable for  Xpert Xpress SARS-CoV-2/FLU/RSV testing.  Fact Sheet for Patients: EntrepreneurPulse.com.au  Fact Sheet for Healthcare Providers: IncredibleEmployment.be  This test is not yet approved or cleared by the [place] FDA and has been authorized for detection and/or diagnosis of SARS-CoV-2 by FDA under an Emergency Use Authorization (EUA). This EUA will remain in effect (meaning this test can be used) for the duration of the COVID-19 declaration under Section 564(b)(1) of the Act, 21 U.S.C. section 360bbb-3(b)(1), unless the authorization is terminated or revoked.  Performed at [facility], [address] [place], [place] [address]   Aerobic/Anaerobic Culture w Gram Stain (surgical/deep wound)     Status: None   Collection Time: 11/11/21  7:34 AM   Specimen: Abscess  Result Value Ref Range Status   Specimen Description   Final    ABSCESS Performed at [facility], [address], [place], [place] [address]    Special Requests   Final    NONE Performed at [facility], [address] [place], [name] [address]    Gram Stain   Final    FEW WBC PRESENT,BOTH PMN AND MONONUCLEAR NO ORGANISMS SEEN    Culture   Final    RARE STAPHYLOCOCCUS LUGDUNENSIS NO ANAEROBES ISOLATED Performed at [facility], [place] [address] [place], [place] [address]    Report Status 11/16/2021 FINAL  Final   Organism ID, Bacteria STAPHYLOCOCCUS LUGDUNENSIS  Final      Susceptibility   Staphylococcus lugdunensis - MIC*    CIPROFLOXACIN <=0.5 SENSITIVE Sensitive     ERYTHROMYCIN >=8 RESISTANT Resistant     GENTAMICIN <=0.5 SENSITIVE Sensitive     OXACILLIN 2 SENSITIVE Sensitive     TETRACYCLINE <=1 SENSITIVE Sensitive     VANCOMYCIN <=  0.5 SENSITIVE Sensitive     TRIMETH/SULFA <=10 SENSITIVE Sensitive     CLINDAMYCIN >=8 RESISTANT Resistant     RIFAMPIN <=0.5 SENSITIVE Sensitive     Inducible Clindamycin NEGATIVE Sensitive      * RARE STAPHYLOCOCCUS LUGDUNENSIS  Aerobic/Anaerobic Culture w Gram Stain (surgical/deep wound)     Status: None   Collection Time: 11/13/21  4:58 PM   Specimen: PATH Soft tissue  Result Value Ref Range Status   Specimen Description   Final    TISSUE Performed at [facility], [address] [place], [place] [address]    Special Requests   Final    SOFT TISSUE Performed at [facility], [address] [place], [place] [address]    Gram Stain   Final    MODERATE WBC PRESENT,BOTH PMN AND MONONUCLEAR NO ORGANISMS SEEN    Culture   Final    RARE STAPHYLOCOCCUS LUGDUNENSIS NO ANAEROBES ISOLATED Performed at [facility], [place] [address] [place], [place] [address]    Report Status 11/18/2021 FINAL  Final   Organism ID, Bacteria STAPHYLOCOCCUS LUGDUNENSIS  Final      Susceptibility   Staphylococcus lugdunensis - MIC*    CIPROFLOXACIN <=0.5 SENSITIVE Sensitive     ERYTHROMYCIN >=8 RESISTANT Resistant     GENTAMICIN <=0.5 SENSITIVE Sensitive     OXACILLIN >=4 RESISTANT Resistant     TETRACYCLINE <=1 SENSITIVE Sensitive     VANCOMYCIN <=0.5 SENSITIVE Sensitive     TRIMETH/SULFA <=10 SENSITIVE Sensitive     CLINDAMYCIN >=8 RESISTANT Resistant     RIFAMPIN <=0.5 SENSITIVE Sensitive     Inducible Clindamycin NEGATIVE Sensitive     * RARE STAPHYLOCOCCUS LUGDUNENSIS  Aerobic/Anaerobic Culture w Gram Stain (surgical/deep wound)     Status: None (Preliminary result)   Collection Time: 11/16/21  9:12 AM   Specimen: Abscess  Result Value Ref Range Status   Specimen Description   Final    ABSCESS Performed at [facility], [address], [place], [place] [address]    Special Requests   Final    talus bone culture A RIGHT ANKLE Performed at [facility], [address] [place], [place] [address]    Gram Stain NO WBC SEEN NO ORGANISMS SEEN   Final   Culture   Final    NO GROWTH 3 DAYS NO ANAEROBES ISOLATED; CULTURE IN PROGRESS FOR 5  DAYS Performed at [facility] [address], [place], [place] [address]    Report Status PENDING  Incomplete  Aerobic/Anaerobic Culture w Gram Stain (surgical/deep wound)     Status: None   Collection Time: 11/16/21  9:26 AM   Specimen: Abscess  Result Value Ref Range Status   Specimen Description   Final    ABSCESS Performed at [facility], [address] [place], [place] [address]    Special Requests   Final    RT ANKLE FIBULA BONE CULTURE B Performed at [facility], [address] [place], [place] [address]    Gram Stain NO WBC SEEN NO ORGANISMS SEEN   Final   Culture   Final    RARE BACILLUS SPECIES Standardized susceptibility testing for this organism is not available. Performed at [facility], [place] [address], [place],  [address]    Report Status 11/19/2021 FINAL  Final     Labs: BNP (last 3 results) No results for input(s): BNP in the last 8760 hours. Basic Metabolic Panel: Recent  Labs  Lab 11/13/21 0232 11/14/21 0149 11/15/21 0457 11/18/21 0319  NA  --  134*  --  133*  K  --  3.9  --  4.0  CL  --  102  --  101  CO2  --  24  --  26  GLUCOSE  --  217*  --  175*  BUN  --  21  --  17  CREATININE 0.68 0.85 0.71 0.86  CALCIUM  --  8.4*  --  8.4*  MG  --  2.0  --   --    Liver Function Tests: No results for input(s): AST, ALT, ALKPHOS, BILITOT, PROT, ALBUMIN in the last 168 hours. No results for input(s): LIPASE, AMYLASE in the last 168 hours. No results for input(s): AMMONIA in the last 168 hours. CBC: Recent Labs  Lab 11/13/21 0232 11/14/21 0149 11/18/21 0319  WBC 6.7 6.6 7.7  HGB 13.7 13.6 12.3*  HCT 42.5 42.0 37.6*  MCV 76.7* 77.5* 77.5*  PLT 226 260 294   Cardiac Enzymes: No results for input(s): CKTOTAL, CKMB, CKMBINDEX, TROPONINI in the last 168 hours. BNP: Invalid input(s): POCBNP CBG: Recent Labs  Lab 11/18/21 1151 11/18/21 1653 11/18/21 2114 11/19/21 0824 11/19/21 1152  GLUCAP 245* 122*  210* 193* 183*   D-Dimer No results for input(s): DDIMER in the last 72 hours. Hgb A1c No results for input(s): HGBA1C in the last 72 hours. Lipid Profile No results for input(s): CHOL, HDL, LDLCALC, TRIG, CHOLHDL, LDLDIRECT in the last 72 hours. Thyroid function studies No results for input(s): TSH, T4TOTAL, T3FREE, THYROIDAB in the last 72 hours.  Invalid input(s): FREET3 Anemia work up No results for input(s): VITAMINB12, FOLATE, FERRITIN, TIBC, IRON, RETICCTPCT in the last 72 hours. Urinalysis    Component Value Date/Time   COLORURINE YELLOW (A) 02/12/2018 0920   APPEARANCEUR Clear 01/25/2020 0900   LABSPEC 1.025 02/12/2018 0920   PHURINE 5.0 02/12/2018 0920   GLUCOSEU Trace (A) 01/25/2020 0900   HGBUR NEGATIVE 02/12/2018 0920   BILIRUBINUR Negative 01/25/2020 0900   KETONESUR 5 (A) 02/12/2018 0920   PROTEINUR 3+ (A) 01/25/2020 0900   PROTEINUR 30 (A) 02/12/2018 0920   NITRITE Negative 01/25/2020 0900   NITRITE NEGATIVE 02/12/2018 0920   LEUKOCYTESUR Negative 01/25/2020 0900   Sepsis Labs Invalid input(s): PROCALCITONIN,  WBC,  LACTICIDVEN Microbiology Recent Results (from the past 240 hour(s))  Culture, blood (routine x 2)     Status: None (Preliminary result)   Collection Time: 11/10/21  3:34 PM   Specimen: BLOOD  Result Value Ref Range Status   Specimen Description BLOOD RIGHT ANTECUBITAL  Final   Special Requests   Final    BOTTLES DRAWN AEROBIC AND ANAEROBIC Blood Culture adequate volume   Culture   Final    NO GROWTH 4 DAYS Performed at [facility], [address], [place], [place] [address]    Report Status PENDING  Incomplete  Culture, blood (routine x 2)     Status: None (Preliminary result)   Collection Time: 11/10/21  3:39 PM   Specimen: BLOOD  Result Value Ref Range Status   Specimen Description BLOOD BLOOD RIGHT FOREARM  Final   Special Requests   Final    BOTTLES DRAWN AEROBIC AND ANAEROBIC Blood Culture adequate volume   Culture    Final    NO GROWTH 4 DAYS Performed at [facility], [address], [place], [place] [address]    Report Status PENDING  Incomplete  Resp Panel  by RT-PCR (Flu A&B, Covid) Nasopharyngeal Swab     Status: None   Collection Time: 11/11/21  1:41 AM   Specimen: Nasopharyngeal Swab; Nasopharyngeal(NP) swabs in vial transport medium  Result Value Ref Range Status   SARS Coronavirus 2 by RT PCR NEGATIVE NEGATIVE Final    Comment: (NOTE) SARS-CoV-2 target nucleic acids are NOT DETECTED.  The SARS-CoV-2 RNA is generally detectable in upper respiratory specimens during the acute phase of infection. The lowest concentration of SARS-CoV-2 viral copies this assay can detect is 138 copies/mL. A negative result does not preclude SARS-Cov-2 infection and should not be used as the sole basis for treatment or other patient management decisions. A negative result may occur with  improper specimen collection/handling, submission of specimen other than nasopharyngeal swab, presence of viral mutation(s) within the areas targeted by this assay, and inadequate number of viral copies(<138 copies/mL). A negative result must be combined with clinical observations, patient history, and epidemiological information. The expected result is Negative.  Fact Sheet for Patients:  EntrepreneurPulse.com.au  Fact Sheet for Healthcare Providers:  IncredibleEmployment.be  This test is no t yet approved or cleared by the [place] FDA and  has been authorized for detection and/or diagnosis of SARS-CoV-2 by FDA under an Emergency Use Authorization (EUA). This EUA will remain  in effect (meaning this test can be used) for the duration of the COVID-19 declaration under Section 564(b)(1) of the Act, 21 U.S.C.section 360bbb-3(b)(1), unless the authorization is terminated  or revoked sooner.       Influenza A by PCR NEGATIVE NEGATIVE Final   Influenza B by PCR NEGATIVE  NEGATIVE Final    Comment: (NOTE) The Xpert Xpress SARS-CoV-2/FLU/RSV plus assay is intended as an aid in the diagnosis of influenza from Nasopharyngeal swab specimens and should not be used as a sole basis for treatment. Nasal washings and aspirates are unacceptable for Xpert Xpress SARS-CoV-2/FLU/RSV testing.  Fact Sheet for Patients: EntrepreneurPulse.com.au  Fact Sheet for Healthcare Providers: IncredibleEmployment.be  This test is not yet approved or cleared by the [place] FDA and has been authorized for detection and/or diagnosis of SARS-CoV-2 by FDA under an Emergency Use Authorization (EUA). This EUA will remain in effect (meaning this test can be used) for the duration of the COVID-19 declaration under Section 564(b)(1) of the Act, 21 U.S.C. section 360bbb-3(b)(1), unless the authorization is terminated or revoked.  Performed at [facility], [address] [place], [place] [address]   Aerobic/Anaerobic Culture w Gram Stain (surgical/deep wound)     Status: None   Collection Time: 11/11/21  7:34 AM   Specimen: Abscess  Result Value Ref Range Status   Specimen Description   Final    ABSCESS Performed at [facility], [address], [place], [place] [address]    Special Requests   Final    NONE Performed at [facility], [address] [place], [place] [address]    Gram Stain   Final    FEW WBC PRESENT,BOTH PMN AND MONONUCLEAR NO ORGANISMS SEEN    Culture   Final    RARE STAPHYLOCOCCUS LUGDUNENSIS NO ANAEROBES ISOLATED Performed at [facility], [place] [address] [place], [place] [address]    Report Status 11/16/2021 FINAL  Final   Organism ID, Bacteria STAPHYLOCOCCUS LUGDUNENSIS  Final      Susceptibility   Staphylococcus lugdunensis - MIC*    CIPROFLOXACIN <=0.5 SENSITIVE Sensitive     ERYTHROMYCIN >=8 RESISTANT Resistant     GENTAMICIN <=  0.5 SENSITIVE Sensitive     OXACILLIN 2  SENSITIVE Sensitive     TETRACYCLINE <=1 SENSITIVE Sensitive     VANCOMYCIN <=0.5 SENSITIVE Sensitive     TRIMETH/SULFA <=10 SENSITIVE Sensitive     CLINDAMYCIN >=8 RESISTANT Resistant     RIFAMPIN <=0.5 SENSITIVE Sensitive     Inducible Clindamycin NEGATIVE Sensitive     * RARE STAPHYLOCOCCUS LUGDUNENSIS  Aerobic/Anaerobic Culture w Gram Stain (surgical/deep wound)     Status: None   Collection Time: 11/13/21  4:58 PM   Specimen: PATH Soft tissue  Result Value Ref Range Status   Specimen Description   Final    TISSUE Performed at [facility], [address], [place], [place] [address]    Special Requests   Final    SOFT TISSUE Performed at [facility], [address] [place], [place] [address]    Gram Stain   Final    MODERATE WBC PRESENT,BOTH PMN AND MONONUCLEAR NO ORGANISMS SEEN    Culture   Final    RARE STAPHYLOCOCCUS LUGDUNENSIS NO ANAEROBES ISOLATED Performed at [facility], [place] [address], [place], [place] [address]    Report Status 11/18/2021 FINAL  Final   Organism ID, Bacteria STAPHYLOCOCCUS LUGDUNENSIS  Final      Susceptibility   Staphylococcus lugdunensis - MIC*    CIPROFLOXACIN <=0.5 SENSITIVE Sensitive     ERYTHROMYCIN >=8 RESISTANT Resistant     GENTAMICIN <=0.5 SENSITIVE Sensitive     OXACILLIN >=4 RESISTANT Resistant     TETRACYCLINE <=1 SENSITIVE Sensitive     VANCOMYCIN <=0.5 SENSITIVE Sensitive     TRIMETH/SULFA <=10 SENSITIVE Sensitive     CLINDAMYCIN >=8 RESISTANT Resistant     RIFAMPIN <=0.5 SENSITIVE Sensitive     Inducible Clindamycin NEGATIVE Sensitive     * RARE STAPHYLOCOCCUS LUGDUNENSIS  Aerobic/Anaerobic Culture w Gram Stain (surgical/deep wound)     Status: None (Preliminary result)   Collection Time: 11/16/21  9:12 AM   Specimen: Abscess  Result Value Ref Range Status   Specimen Description   Final    ABSCESS Performed at [facility], [address], [place], [place] [address]    Special  Requests   Final    talus bone culture A RIGHT ANKLE Performed at [facility], [address] [place], [place] [address]    Gram Stain NO WBC SEEN NO ORGANISMS SEEN   Final   Culture   Final    NO GROWTH 3 DAYS NO ANAEROBES ISOLATED; CULTURE IN PROGRESS FOR 5 DAYS Performed at [place] [address], [place], [place] [address]    Report Status PENDING  Incomplete  Aerobic/Anaerobic Culture w Gram Stain (surgical/deep wound)     Status: None   Collection Time: 11/16/21  9:26 AM   Specimen: Abscess  Result Value Ref Range Status   Specimen Description   Final    ABSCESS Performed at [facility], [address], [place], [place] [address]    Special Requests   Final    RT ANKLE FIBULA BONE CULTURE B Performed at [facility], [address] [place], [place] [address]    Gram Stain NO WBC SEEN NO ORGANISMS SEEN   Final   Culture   Final    RARE BACILLUS SPECIES Standardized susceptibility testing for this organism is not available. Performed at [facility], [place] [address] [place], [place] [address]    Report Status 11/19/2021 FINAL  Final     Time coordinating discharge:  35 minutes  SIGNED:   Barb Merino, MD  [facility] 11/19/2021, 2:37 PM

## 2021-11-20 ENCOUNTER — Other Ambulatory Visit: Payer: Self-pay | Admitting: *Deleted

## 2021-11-20 DIAGNOSIS — G4733 Obstructive sleep apnea (adult) (pediatric): Secondary | ICD-10-CM | POA: Diagnosis not present

## 2021-11-20 DIAGNOSIS — D751 Secondary polycythemia: Secondary | ICD-10-CM

## 2021-11-20 DIAGNOSIS — M86171 Other acute osteomyelitis, right ankle and foot: Secondary | ICD-10-CM | POA: Diagnosis not present

## 2021-11-20 LAB — CULTURE, BLOOD (ROUTINE X 2)
Culture: NO GROWTH
Culture: NO GROWTH
Special Requests: ADEQUATE
Special Requests: ADEQUATE

## 2021-11-20 LAB — SURGICAL PATHOLOGY

## 2021-11-21 DIAGNOSIS — M86171 Other acute osteomyelitis, right ankle and foot: Secondary | ICD-10-CM | POA: Diagnosis not present

## 2021-11-21 LAB — AEROBIC/ANAEROBIC CULTURE W GRAM STAIN (SURGICAL/DEEP WOUND)
Culture: NO GROWTH
Gram Stain: NONE SEEN

## 2021-11-22 ENCOUNTER — Ambulatory Visit (INDEPENDENT_AMBULATORY_CARE_PROVIDER_SITE_OTHER): Payer: BC Managed Care – PPO | Admitting: Podiatry

## 2021-11-22 ENCOUNTER — Other Ambulatory Visit: Payer: Self-pay

## 2021-11-22 DIAGNOSIS — M86171 Other acute osteomyelitis, right ankle and foot: Secondary | ICD-10-CM | POA: Diagnosis not present

## 2021-11-22 DIAGNOSIS — Z9889 Other specified postprocedural states: Secondary | ICD-10-CM

## 2021-11-23 DIAGNOSIS — M86171 Other acute osteomyelitis, right ankle and foot: Secondary | ICD-10-CM | POA: Diagnosis not present

## 2021-11-24 DIAGNOSIS — M86171 Other acute osteomyelitis, right ankle and foot: Secondary | ICD-10-CM | POA: Diagnosis not present

## 2021-11-25 DIAGNOSIS — M86171 Other acute osteomyelitis, right ankle and foot: Secondary | ICD-10-CM | POA: Diagnosis not present

## 2021-11-26 DIAGNOSIS — M86171 Other acute osteomyelitis, right ankle and foot: Secondary | ICD-10-CM | POA: Diagnosis not present

## 2021-11-27 ENCOUNTER — Inpatient Hospital Stay: Payer: BC Managed Care – PPO

## 2021-11-27 ENCOUNTER — Inpatient Hospital Stay: Payer: BC Managed Care – PPO | Admitting: Oncology

## 2021-11-27 DIAGNOSIS — M86171 Other acute osteomyelitis, right ankle and foot: Secondary | ICD-10-CM | POA: Diagnosis not present

## 2021-11-27 DIAGNOSIS — M869 Osteomyelitis, unspecified: Secondary | ICD-10-CM | POA: Diagnosis not present

## 2021-11-28 ENCOUNTER — Encounter: Payer: Self-pay | Admitting: Family Medicine

## 2021-11-28 ENCOUNTER — Telehealth: Payer: Self-pay | Admitting: Oncology

## 2021-11-28 DIAGNOSIS — M86171 Other acute osteomyelitis, right ankle and foot: Secondary | ICD-10-CM | POA: Diagnosis not present

## 2021-11-28 NOTE — Telephone Encounter (Signed)
Pt caled to reschedule his appt that he missed on 12-28. He broke his foot,ankle. Call back at [phone]

## 2021-11-28 NOTE — Telephone Encounter (Signed)
Already spoke to pt, he is aware of his appts.

## 2021-11-28 NOTE — Telephone Encounter (Signed)
Appt was moved to 4 months per Dr. Tasia Catchings. Abby will you reach out to pt with new appt details?

## 2021-11-29 ENCOUNTER — Encounter: Payer: Self-pay | Admitting: Podiatry

## 2021-11-29 ENCOUNTER — Other Ambulatory Visit: Payer: Self-pay

## 2021-11-29 ENCOUNTER — Ambulatory Visit (INDEPENDENT_AMBULATORY_CARE_PROVIDER_SITE_OTHER): Payer: BC Managed Care – PPO | Admitting: Podiatry

## 2021-11-29 VITALS — Temp 98.7°F

## 2021-11-29 DIAGNOSIS — Z9889 Other specified postprocedural states: Secondary | ICD-10-CM

## 2021-11-29 DIAGNOSIS — M86171 Other acute osteomyelitis, right ankle and foot: Secondary | ICD-10-CM | POA: Diagnosis not present

## 2021-11-30 DIAGNOSIS — M86171 Other acute osteomyelitis, right ankle and foot: Secondary | ICD-10-CM | POA: Diagnosis not present

## 2021-12-01 DIAGNOSIS — M86171 Other acute osteomyelitis, right ankle and foot: Secondary | ICD-10-CM | POA: Diagnosis not present

## 2021-12-02 DIAGNOSIS — M86171 Other acute osteomyelitis, right ankle and foot: Secondary | ICD-10-CM | POA: Diagnosis not present

## 2021-12-03 DIAGNOSIS — M86171 Other acute osteomyelitis, right ankle and foot: Secondary | ICD-10-CM | POA: Diagnosis not present

## 2021-12-03 NOTE — Progress Notes (Signed)
Subjective:  Patient presents today status post right ankle incision and drainage. DOS: 11/13/2021 secondary to septic joint from complication of lateral ankle stabilization procedure 09/05/2021.  Patient is glad to be home from the hospital.  He states that he is doing okay.  He continues to have drainage and he is changing his dressing daily with the assistance of his wife.  He continues to have some pain throughout the day.  He presents for further treatment and evaluation  Past Medical History:  Diagnosis Date   BPH (benign prostatic hyperplasia)    Bronchitis    Complication of anesthesia    hard to wake up   DM (diabetes mellitus), type 2 ([place])    DVT (deep venous thrombosis) ([place])    post op   ED (erectile dysfunction)    Erythrocytosis 07/04/2020   GERD (gastroesophageal reflux disease)    Hyperlipemia    Hypertension    Hypogonadism in male    Nocturia    Right ankle instability    Sleep apnea    TB lung, latent    Urinary frequency      Past Surgical History:  Procedure Laterality Date   ANKLE ARTHROSCOPY Right 11/13/2021   Procedure: ANKLE ARTHROSCOPY Arthroscopic Irrigation;  Surgeon: [name], DPM;  Location: [facility] OR[facility];  Service: Podiatry;  Laterality: Right;   ANKLE SURGERY Left    torn ligament   CARPAL TUNNEL RELEASE Right 08/2019   COLONOSCOPY  2009   IRRIGATION AND DEBRIDEMENT ABSCESS Right 11/13/2021   Procedure: IRRIGATION AND DEBRIDEMENT ABSCESS;  Surgeon: [name], DPM;  Location: [facility] OR[facility];  Service: Podiatry;  Laterality: Right;   IRRIGATION AND DEBRIDEMENT FOOT Right 11/16/2021   Procedure: IRRIGATION AND DEBRIDEMENT ANKLE;  Surgeon: [place] Kins, DPM;  Location: [facility] OR[facility];  Service: Podiatry;  Laterality: Right;   KNEE ARTHROSCOPY Left    right ankle ruptured tendon surgery  2008   TRANSURETHRAL RESECTION OF PROSTATE N/A 02/19/2018   Procedure: TRANSURETHRAL RESECTION OF THE PROSTATE (TURP);  Surgeon: [name], MD;  Location:  [facility] ORS;  Service: Urology;  Laterality: N/A;   Allergies  Allergen Reactions   Penicillins Hives    Has patient had a PCN reaction causing immediate rash, facial/tongue/throat swelling, SOB or lightheadedness with hypotension: yes Has patient had a PCN reaction causing severe rash involving mucus membranes or skin necrosis: no Has patient had a PCN reaction that required hospitalization: no Has patient had a PCN reaction occurring within the last 10 years: no If all of the above answers are "NO", then may proceed with Cephalosporin use.       Objective/Physical Exam Neurovascular status intact.  Sutures to the proximal and distal portion of the incision site are intact.  The central portion was left open with packing however there is no packing today.  It appears that the erythema and edema has significantly improved since being discharged in the hospital.  There continues to be serous drainage.  No malodor.  Cavovarus deformity right lower extremity  Assessment: 1. s/p incision and drainage RT ankle. DOS: 11/13/2021   Plan of Care:  1. Patient was evaluated.  2. Dressings changed today. 3.  Continue IV antibiotics as per infectious disease, cefazolin IV 2 g every 8 hours 4.  Continue daily dressing changes.  Paint around incision with Betadine.  Apply dry dressings.  They are doing well with this at home.  No packing for the moment 5.  Return to clinic 1 week  [place] Kins, DPM [facility]  Dr. Edrick Kins, DPM    [address], [place] [address]                Office [phone]  Fax [phone]

## 2021-12-04 DIAGNOSIS — M869 Osteomyelitis, unspecified: Secondary | ICD-10-CM | POA: Diagnosis not present

## 2021-12-04 DIAGNOSIS — M86171 Other acute osteomyelitis, right ankle and foot: Secondary | ICD-10-CM | POA: Diagnosis not present

## 2021-12-05 DIAGNOSIS — M86171 Other acute osteomyelitis, right ankle and foot: Secondary | ICD-10-CM | POA: Diagnosis not present

## 2021-12-06 ENCOUNTER — Other Ambulatory Visit: Payer: Self-pay

## 2021-12-06 ENCOUNTER — Ambulatory Visit (INDEPENDENT_AMBULATORY_CARE_PROVIDER_SITE_OTHER): Payer: BC Managed Care – PPO | Admitting: Podiatry

## 2021-12-06 ENCOUNTER — Encounter: Payer: Self-pay | Admitting: Podiatry

## 2021-12-06 DIAGNOSIS — Z9889 Other specified postprocedural states: Secondary | ICD-10-CM

## 2021-12-06 DIAGNOSIS — M86171 Other acute osteomyelitis, right ankle and foot: Secondary | ICD-10-CM | POA: Diagnosis not present

## 2021-12-06 NOTE — Progress Notes (Signed)
Subjective:  Patient presents today status post right ankle incision and drainage. DOS: 11/13/2021 secondary to septic joint from complication of lateral ankle stabilization procedure 09/05/2021.  Patient continues to do well.  He states that he has been slightly putting some pressure on his foot with the assistance of the crutches.  He was very frustrated with the PICC line and dressing changes to the right ankle however he understands the importance of both.  Past Medical History:  Diagnosis Date   BPH (benign prostatic hyperplasia)    Bronchitis    Complication of anesthesia    hard to wake up   DM (diabetes mellitus), type 2 ([place])    DVT (deep venous thrombosis) ([place])    post op   ED (erectile dysfunction)    Erythrocytosis 07/04/2020   GERD (gastroesophageal reflux disease)    Hyperlipemia    Hypertension    Hypogonadism in male    Nocturia    Right ankle instability    Sleep apnea    TB lung, latent    Urinary frequency      Past Surgical History:  Procedure Laterality Date   ANKLE ARTHROSCOPY Right 11/13/2021   Procedure: ANKLE ARTHROSCOPY Arthroscopic Irrigation;  Surgeon: [name], DPM;  Location: [facility];  Service: Podiatry;  Laterality: Right;   ANKLE SURGERY Left    torn ligament   CARPAL TUNNEL RELEASE Right 08/2019   COLONOSCOPY  2009   IRRIGATION AND DEBRIDEMENT ABSCESS Right 11/13/2021   Procedure: IRRIGATION AND DEBRIDEMENT ABSCESS;  Surgeon: [name], DPM;  Location: [facility];  Service: Podiatry;  Laterality: Right;   IRRIGATION AND DEBRIDEMENT FOOT Right 11/16/2021   Procedure: IRRIGATION AND DEBRIDEMENT ANKLE;  Surgeon: [place] Kins, DPM;  Location: [facility];  Service: Podiatry;  Laterality: Right;   KNEE ARTHROSCOPY Left    right ankle ruptured tendon surgery  2008   TRANSURETHRAL RESECTION OF PROSTATE N/A 02/19/2018   Procedure: TRANSURETHRAL RESECTION OF THE PROSTATE (TURP);  Surgeon: [name], MD;  Location: [facility];   Service: Urology;  Laterality: N/A;   Allergies  Allergen Reactions   Penicillins Hives    Has patient had a PCN reaction causing immediate rash, facial/tongue/throat swelling, SOB or lightheadedness with hypotension: yes Has patient had a PCN reaction causing severe rash involving mucus membranes or skin necrosis: no Has patient had a PCN reaction that required hospitalization: no Has patient had a PCN reaction occurring within the last 10 years: no If all of the above answers are "NO", then may proceed with Cephalosporin use.         Objective/Physical Exam Neurovascular status intact.  Sutures to the proximal and distal portion of the incision site are intact.  Central portion of the incision site and wound appears to be healing significantly.  Wound measures approximately 1.0 x 1.0 x 0.2 cm.  Granular wound base.  There is a significant reduction in the drainage noted.  Please see above noted pictures overall there has been steady improvement over the past 3 weeks since discharge.  Cavovarus deformity right lower extremity  Assessment: 1. s/p incision and drainage RT ankle. DOS: 11/13/2021   Plan of Care:  1. Patient was evaluated.  Patient may begin putting partial weight on the foot and ankle with the assistance of crutches. 2. Dressings changed today.  Sutures removed today. 3.  Continue IV antibiotics as per infectious disease, cefazolin IV 2 g every 8 hours.  Patient has an appointment 12/12/2020 with ID,  Dr. Ramon Dredge.  The patient was inquiring when he will be able to discontinue IV antibiotics and transition over to oral antibiotics.  This will be determined by infectious disease.  Their input and management is greatly appreciated 4.  Continue daily dressing changes.  Paint around incision with Betadine.  Apply ABD pad over the wound and Ace wrap.  This dressing regimen appears to be doing well for the patient with significant improvement of the wound.  We will continue for  now.   5.  Return to clinic weekly   [place] Kins, DPM [facility]  Dr. Edrick Kins, DPM    [address], [place] [address]                Office [phone]  Fax [phone]

## 2021-12-07 DIAGNOSIS — M86171 Other acute osteomyelitis, right ankle and foot: Secondary | ICD-10-CM | POA: Diagnosis not present

## 2021-12-08 DIAGNOSIS — M86171 Other acute osteomyelitis, right ankle and foot: Secondary | ICD-10-CM | POA: Diagnosis not present

## 2021-12-09 DIAGNOSIS — M86171 Other acute osteomyelitis, right ankle and foot: Secondary | ICD-10-CM | POA: Diagnosis not present

## 2021-12-10 DIAGNOSIS — M86171 Other acute osteomyelitis, right ankle and foot: Secondary | ICD-10-CM | POA: Diagnosis not present

## 2021-12-11 DIAGNOSIS — M86171 Other acute osteomyelitis, right ankle and foot: Secondary | ICD-10-CM | POA: Diagnosis not present

## 2021-12-11 DIAGNOSIS — M869 Osteomyelitis, unspecified: Secondary | ICD-10-CM | POA: Diagnosis not present

## 2021-12-12 ENCOUNTER — Other Ambulatory Visit: Payer: Self-pay

## 2021-12-12 ENCOUNTER — Ambulatory Visit: Payer: BC Managed Care – PPO | Attending: Infectious Diseases | Admitting: Infectious Diseases

## 2021-12-12 VITALS — BP 110/76 | HR 100 | Resp 16 | Ht 74.0 in | Wt 225.0 lb

## 2021-12-12 DIAGNOSIS — T847XXD Infection and inflammatory reaction due to other internal orthopedic prosthetic devices, implants and grafts, subsequent encounter: Secondary | ICD-10-CM | POA: Diagnosis not present

## 2021-12-12 DIAGNOSIS — T8142XA Infection following a procedure, deep incisional surgical site, initial encounter: Secondary | ICD-10-CM | POA: Diagnosis not present

## 2021-12-12 DIAGNOSIS — Z7984 Long term (current) use of oral hypoglycemic drugs: Secondary | ICD-10-CM | POA: Insufficient documentation

## 2021-12-12 DIAGNOSIS — L02419 Cutaneous abscess of limb, unspecified: Secondary | ICD-10-CM | POA: Diagnosis not present

## 2021-12-12 DIAGNOSIS — D751 Secondary polycythemia: Secondary | ICD-10-CM | POA: Insufficient documentation

## 2021-12-12 DIAGNOSIS — E785 Hyperlipidemia, unspecified: Secondary | ICD-10-CM | POA: Insufficient documentation

## 2021-12-12 DIAGNOSIS — Z9079 Acquired absence of other genital organ(s): Secondary | ICD-10-CM | POA: Diagnosis not present

## 2021-12-12 DIAGNOSIS — Y838 Other surgical procedures as the cause of abnormal reaction of the patient, or of later complication, without mention of misadventure at the time of the procedure: Secondary | ICD-10-CM | POA: Insufficient documentation

## 2021-12-12 DIAGNOSIS — E119 Type 2 diabetes mellitus without complications: Secondary | ICD-10-CM | POA: Insufficient documentation

## 2021-12-12 DIAGNOSIS — E291 Testicular hypofunction: Secondary | ICD-10-CM | POA: Diagnosis not present

## 2021-12-12 DIAGNOSIS — M86171 Other acute osteomyelitis, right ankle and foot: Secondary | ICD-10-CM | POA: Diagnosis not present

## 2021-12-12 DIAGNOSIS — M79676 Pain in unspecified toe(s): Secondary | ICD-10-CM

## 2021-12-12 NOTE — Progress Notes (Signed)
NAME: Jonathan Fields  DOB: 15-Dec-1955  MRN: [id number]  Date/Time: 12/12/2021 11:39 AM   Subjective:    Follow up after recent hospitalization for rt ankle infection 12/11-12/20/22 and he is on IV cefazolin for staph lugdunensis- He is here with his wife? Jonathan Fields is a 66 y.o. with a history of DM, erythrocytosis sec to chronic testosterone use, frequent phlebotomy, cavus foot deformity b/l ankle surgery Pt on 09/05/21 had a ankle stabilization procedure to the rt foot. On his follow up visit on 09/12/21 there was some erythema noted without any signs of dehiscence and he was prescribed Doxy.He was having weekly follow ups and the foot was doing okay.He presented to the ED on 12/11 with 2 day h/o swelling, pain and redness in the rt ankle at the surgical scar. He had chills but did not check temp. MRI showed Moderate ankle joint effusion. Peripherally enhancing 3.6 x 2.1 x 4 cm fluid collection in the soft tissues along the anterolateral aspect of the ankle joint abutting the lateral malleolus and contiguous with the tibiotalar joint space most concerning for an abscess and septic arthritis. On 11/13/21 he was taken to OR and underwent right ankle arthroscopy with extensive debridement and incision and drainage of abscess of right ankle. Culture from bedside debidement on  12/12 was staph lugdunensis, and so was the surgical cultures- Blood culture was negative. On 11/16/2021 patient underwent repeat I&D and removal of all sutures and anchor from the surgery that was done on 09/05/2021.  Blood cultures negative. Pt in 2008 had reconstruction surgery and hardware in calcaneum at that site had no infection. Patient was discharged home on IV cefazolin for 6 weeks.  To finish on 12/25/2021. Patient is doing much better Surgical site is Healed except for pinpoint opening. Patient is tired of getting IV antibiotics.  Wants to go on p.o. antibiotic.  He has a follow-up with Dr. Amalia Hailey tomorrow.  Past  Medical History:  Diagnosis Date   BPH (benign prostatic hyperplasia)    Bronchitis    Complication of anesthesia    hard to wake up   DM (diabetes mellitus), type 2 ([place])    DVT (deep venous thrombosis) ([place])    post op   ED (erectile dysfunction)    Erythrocytosis 07/04/2020   GERD (gastroesophageal reflux disease)    Hyperlipemia    Hypertension    Hypogonadism in male    Nocturia    Right ankle instability    Sleep apnea    TB lung, latent    Urinary frequency     Past Surgical History:  Procedure Laterality Date   ANKLE ARTHROSCOPY Right 11/13/2021   Procedure: ANKLE ARTHROSCOPY Arthroscopic Irrigation;  Surgeon: [name], DPM;  Location: [facility];  Service: Podiatry;  Laterality: Right;   ANKLE SURGERY Left    torn ligament   CARPAL TUNNEL RELEASE Right 08/2019   COLONOSCOPY  2009   IRRIGATION AND DEBRIDEMENT ABSCESS Right 11/13/2021   Procedure: IRRIGATION AND DEBRIDEMENT ABSCESS;  Surgeon: [name], DPM;  Location: [facility];  Service: Podiatry;  Laterality: Right;   IRRIGATION AND DEBRIDEMENT FOOT Right 11/16/2021   Procedure: IRRIGATION AND DEBRIDEMENT ANKLE;  Surgeon: Edrick Kins, DPM;  Location: [facility];  Service: Podiatry;  Laterality: Right;   KNEE ARTHROSCOPY Left    right ankle ruptured tendon surgery  2008   TRANSURETHRAL RESECTION OF PROSTATE N/A 02/19/2018   Procedure: TRANSURETHRAL RESECTION OF THE PROSTATE (TURP);  Surgeon: [name], MD;  Location: [facility];  Service: Urology;  Laterality: N/A;    Social History   Socioeconomic History   Marital status: Married    Spouse name: Not on file   Number of children: 0   Years of education: Not on file   Highest education level: Not on file  Occupational History   Not on file  Tobacco Use   Smoking status: Never   Smokeless tobacco: Current    Types: Snuff   Tobacco comments:    tobaco pouches that produce juice  Vaping Use   Vaping Use: Never used  Substance and Sexual  Activity   Alcohol use: Yes    Comment: COUPLE OF DRINKS EVERY WEEK   Drug use: No   Sexual activity: Yes    Partners: Female    Birth control/protection: None  Other Topics Concern   Not on file  Social History Narrative   Not on file   Social Determinants of Health   Financial Resource Strain: Not on file  Food Insecurity: Not on file  Transportation Needs: Not on file  Physical Activity: Not on file  Stress: Not on file  Social Connections: Not on file  Intimate Partner Violence: Not on file    Family History  Problem Relation Age of Onset   Heart disease Father    Hypertension Father    Prostate cancer Father    Heart disease Mother    Breast cancer Mother    Colon cancer Neg Hx    Allergies  Allergen Reactions   Penicillins Hives    Has patient had a PCN reaction causing immediate rash, facial/tongue/throat swelling, SOB or lightheadedness with hypotension: yes Has patient had a PCN reaction causing severe rash involving mucus membranes or skin necrosis: no Has patient had a PCN reaction that required hospitalization: no Has patient had a PCN reaction occurring within the last 10 years: no If all of the above answers are "NO", then may proceed with Cephalosporin use.    I? Current Outpatient Medications  Medication Sig Dispense Refill   ceFAZolin (ANCEF) IVPB Inject 2 g into the vein every 8 (eight) hours. Indication:  septic arthritis and osteomyelitis First Dose: Yes Last Day of Therapy:  12/25/2021 Labs - Once weekly:  CBC/D, CMP,CRP, ESR Method of administration: IV Push Method of administration may be changed at the discretion of home infusion pharmacist based upon assessment of the patient and/or caregiver's ability to self-administer the medication ordered. 37 Units 0   dapagliflozin propanediol (FARXIGA) 5 MG TABS tablet Take 5 mg by mouth daily.     fluticasone (FLONASE) 50 MCG/ACT nasal spray 1-2 sprays daily as needed.  0   lisinopril (ZESTRIL) 40 MG  tablet TAKE 1 TABLET BY MOUTH DAILY 90 tablet 0   metFORMIN (GLUCOPHAGE XR) 500 MG 24 hr tablet Take 2 tablets (1,000 mg total) by mouth in the morning and at bedtime. 360 tablet 1   MYRBETRIQ 50 MG TB24 tablet Take 50 mg by mouth daily.     NEEDLE, DISP, 21 G (BD SAFETYGLIDE SHIELDED NEEDLE) 21G X 1-1/2" MISC Use 21guage needle to inject Testosterone Cypionate 2m. 25 each 0   omeprazole (PRILOSEC) 20 MG capsule Take 20 mg by mouth daily.      Protein POWD Take 1 Scoop by mouth daily.     testosterone cypionate (DEPOTESTOSTERONE CYPIONATE) 200 MG/ML injection INJECT 0.5 ML IN THE MUSCLE EVERY WEEK 10 mL 0   diphenhydramine-acetaminophen (TYLENOL PM) 25-500 MG TABS tablet Take 1 tablet  by mouth at bedtime as needed. (Patient not taking: Reported on 12/12/2021)     gabapentin (NEURONTIN) 300 MG capsule Take 300 mg by mouth daily. (Patient not taking: Reported on 12/12/2021)     hydrochlorothiazide (HYDRODIURIL) 12.5 MG tablet Take 1 tablet (12.5 mg total) by mouth daily. (Patient not taking: Reported on 12/12/2021) 90 tablet 3   lactulose (CHRONULAC) 10 GM/15ML solution Take 30 mLs (20 g total) by mouth 2 (two) times daily as needed for mild constipation or moderate constipation. (Patient not taking: Reported on 12/12/2021) 236 mL 0   meloxicam (MOBIC) 15 MG tablet Take 15 mg by mouth daily. (Patient not taking: Reported on 12/12/2021)     senna-docusate (SENOKOT-S) 8.6-50 MG tablet Take 1 tablet by mouth at bedtime. (Patient not taking: Reported on 12/12/2021) 30 tablet 0   No current facility-administered medications for this visit.     Abtx:  Anti-infectives (From admission, onward)    None       REVIEW OF SYSTEMS:  Const: negative fever, negative chills, negative weight loss Eyes: negative diplopia or visual changes, negative eye pain ENT: negative coryza, negative sore throat Resp: negative cough, hemoptysis, dyspnea Cards: negative for chest pain, palpitations, lower extremity edema GU:  negative for frequency, dysuria and hematuria GI: Negative for abdominal pain, diarrhea, bleeding, constipation Skin: negative for rash and pruritus Heme: negative for easy bruising and gum/nose bleeding MS: Pain right foot Neurolo:negative for headaches, dizziness, vertigo, memory problems  Psych: negative for feelings of anxiety, depression  Endocrine: , diabetes Allergy/Immunology-penicillin Objective:  VITALS:  BP 110/76    Pulse 100    Resp 16    Ht 6' 2" (1.88 m)    Wt 225 lb (102.1 kg)    SpO2 98%    BMI 28.89 kg/m  PHYSICAL EXAM:  General: Alert, cooperative, no distress, appears stated age.  Head: Normocephalic, without obvious abnormality, atraumatic. Eyes: Conjunctivae clear, anicteric sclerae. Pupils are equal ENT Nares normal. No drainage or sinus tenderness. Lips, mucosa, and tongue normal. No Thrush Neck: , symmetrical, no adenopathy, thyroid: non tender no carotid bruit and no JVD. Lungs: Clear to auscultation bilaterally. No Wheezing or Rhonchi. No rales. Heart: Regular rate and rhythm, no murmur, rub or gallop. Abdomen: Did not examine Extremities: Right foot cavus deformity 1/12   1/6  12/30  12/12   Lymph: Cervical, supraclavicular normal. Neurologic: Grossly non-focal Pt when walking with crutches seems to be putting more weight on the rt foot not on the crutches Pertinent Labs Lab Results  ESR 18 ( was 111 before) CRP 2   ? Impression/Recommendation ?Rt Ankle stabilization surgery-- Rt ankle surgical site infection with abscess soft tissue and septic arthritis- s/p arthroscopy and debridement/washout, removal of all suture material and anchor Culture staph lugdunensis On  cefazolin The surgical wound is much improved ESR 18 ( was 111) before Pt has completed 4 weeks and would like to go on PO He has an appt with Dr.Evans tomorrow- Will discuss with him after he sees patient PO antibiotic would be keflex 1 gram Q6 for 2 more weeks Or continue  cefazolin until 12/25/21      DM on metformin and Farxiga   HLD on atorvastatin   Hyogonadism on chronic testosterone   Erythrocytosis due to testosterone- has had multiple phlebotomies - followed by heme as Op ? ? ___________________________________________________ Discussed with patient,and his wife in detail Follow PRN Note:  This document was prepared using Dragon voice recognition software and may include unintentional  dictation errors.

## 2021-12-12 NOTE — Patient Instructions (Addendum)
You are here for follow up of the rt ankle infection. You have been on 4 weeks of IV cefazolin and you would like to go on oral antibiotics- tomorrow you will see Dr.Evans and I will discuss with him. If you prefer to go on oral antibiotics will send prescription for keflex 1 gram Po Q 6. Once the wound heals completely with no discharge we can stop antibioitcs after you finish total of 6 weeks

## 2021-12-13 ENCOUNTER — Ambulatory Visit (INDEPENDENT_AMBULATORY_CARE_PROVIDER_SITE_OTHER): Payer: BC Managed Care – PPO

## 2021-12-13 ENCOUNTER — Ambulatory Visit (INDEPENDENT_AMBULATORY_CARE_PROVIDER_SITE_OTHER): Payer: BC Managed Care – PPO | Admitting: Podiatry

## 2021-12-13 ENCOUNTER — Encounter: Payer: Self-pay | Admitting: Podiatry

## 2021-12-13 DIAGNOSIS — M25371 Other instability, right ankle: Secondary | ICD-10-CM

## 2021-12-13 DIAGNOSIS — Z9889 Other specified postprocedural states: Secondary | ICD-10-CM | POA: Diagnosis not present

## 2021-12-13 DIAGNOSIS — M86171 Other acute osteomyelitis, right ankle and foot: Secondary | ICD-10-CM | POA: Diagnosis not present

## 2021-12-13 NOTE — Progress Notes (Signed)
Subjective:  Patient presents today status post right ankle incision and drainage. DOS: 11/13/2021 secondary to septic joint from complication of lateral ankle stabilization procedure 09/05/2021.  Patient states that he is doing well.  Although he is very frustrated with the healing process he understands the importance of antibiotics and care to the ankle.  He has been keeping the foot clean and dry and his wife has been helping to dress the foot.  No new complaints at this time  Past Medical History:  Diagnosis Date   BPH (benign prostatic hyperplasia)    Bronchitis    Complication of anesthesia    hard to wake up   DM (diabetes mellitus), type 2 ([place])    DVT (deep venous thrombosis) ([place])    post op   ED (erectile dysfunction)    Erythrocytosis 07/04/2020   GERD (gastroesophageal reflux disease)    Hyperlipemia    Hypertension    Hypogonadism in male    Nocturia    Right ankle instability    Sleep apnea    TB lung, latent    Urinary frequency      Past Surgical History:  Procedure Laterality Date   ANKLE ARTHROSCOPY Right 11/13/2021   Procedure: ANKLE ARTHROSCOPY Arthroscopic Irrigation;  Surgeon: [name], DPM;  Location: [facility];  Service: Podiatry;  Laterality: Right;   ANKLE SURGERY Left    torn ligament   CARPAL TUNNEL RELEASE Right 08/2019   COLONOSCOPY  2009   IRRIGATION AND DEBRIDEMENT ABSCESS Right 11/13/2021   Procedure: IRRIGATION AND DEBRIDEMENT ABSCESS;  Surgeon: [name], DPM;  Location: [facility];  Service: Podiatry;  Laterality: Right;   IRRIGATION AND DEBRIDEMENT FOOT Right 11/16/2021   Procedure: IRRIGATION AND DEBRIDEMENT ANKLE;  Surgeon: Edrick Kins, DPM;  Location: [facility];  Service: Podiatry;  Laterality: Right;   KNEE ARTHROSCOPY Left    right ankle ruptured tendon surgery  2008   TRANSURETHRAL RESECTION OF PROSTATE N/A 02/19/2018   Procedure: TRANSURETHRAL RESECTION OF THE PROSTATE (TURP);  Surgeon: [name], MD;   Location: [facility];  Service: Urology;  Laterality: N/A;   Allergies  Allergen Reactions   Penicillins Hives    Has patient had a PCN reaction causing immediate rash, facial/tongue/throat swelling, SOB or lightheadedness with hypotension: yes Has patient had a PCN reaction causing severe rash involving mucus membranes or skin necrosis: no Has patient had a PCN reaction that required hospitalization: no Has patient had a PCN reaction occurring within the last 10 years: no If all of the above answers are "NO", then may proceed with Cephalosporin use.        Objective/Physical Exam Neurovascular status intact.  Sutures to the proximal and distal portion of the incision site are intact.  The central wound appears healthy with granular tissue.  Minimal serosanguineous drainage noted.  There is some periwound maceration.  Please see above photo.  The erythema and edema to the ankle has improved significantly.  No malodor.  Cavovarus deformity right lower extremity  Assessment: 1. s/p incision and drainage RT ankle. DOS: 11/13/2021   Plan of Care:  1. Patient was evaluated.  2. Dressings changed today. 3.  Continue IV antibiotics as per infectious disease, cefazolin IV 2 g every 8 hours.  Patient has follow-up appointment with infectious disease 12/12/2021. 4.  Continue daily dressing changes.  Paint around incision with Betadine.  Apply dry dressings.  They are doing well with this at home.  This dressing regimen appears to  be doing well for the wounds we will continue  5.  Return to clinic 1 week for follow-up x-ray and reevaluation   Edrick Kins, DPM [facility]  Dr. Edrick Kins, DPM    [address], [place] [address]                Office [phone]  Fax [phone]

## 2021-12-13 NOTE — Progress Notes (Signed)
Subjective:  Patient presents today status post right ankle incision and drainage. DOS: 11/13/2021 secondary to septic joint from complication of lateral ankle stabilization procedure 09/05/2021.  Patient continues to do well.  He states that he has been slightly putting some pressure on his foot with the assistance of the crutches.  He was very frustrated with the PICC line and dressing changes to the right ankle however he understands the importance of both.  Past Medical History:  Diagnosis Date   BPH (benign prostatic hyperplasia)    Bronchitis    Complication of anesthesia    hard to wake up   DM (diabetes mellitus), type 2 ([place])    DVT (deep venous thrombosis) ([place])    post op   ED (erectile dysfunction)    Erythrocytosis 07/04/2020   GERD (gastroesophageal reflux disease)    Hyperlipemia    Hypertension    Hypogonadism in male    Nocturia    Right ankle instability    Sleep apnea    TB lung, latent    Urinary frequency      Past Surgical History:  Procedure Laterality Date   ANKLE ARTHROSCOPY Right 11/13/2021   Procedure: ANKLE ARTHROSCOPY Arthroscopic Irrigation;  Surgeon: [name], DPM;  Location: [facility];  Service: Podiatry;  Laterality: Right;   ANKLE SURGERY Left    torn ligament   CARPAL TUNNEL RELEASE Right 08/2019   COLONOSCOPY  2009   IRRIGATION AND DEBRIDEMENT ABSCESS Right 11/13/2021   Procedure: IRRIGATION AND DEBRIDEMENT ABSCESS;  Surgeon: [name], DPM;  Location: [facility];  Service: Podiatry;  Laterality: Right;   IRRIGATION AND DEBRIDEMENT FOOT Right 11/16/2021   Procedure: IRRIGATION AND DEBRIDEMENT ANKLE;  Surgeon: Edrick Kins, DPM;  Location: [facility];  Service: Podiatry;  Laterality: Right;   KNEE ARTHROSCOPY Left    right ankle ruptured tendon surgery  2008   TRANSURETHRAL RESECTION OF PROSTATE N/A 02/19/2018   Procedure: TRANSURETHRAL RESECTION OF THE PROSTATE (TURP);  Surgeon: [name], MD;  Location: [facility];   Service: Urology;  Laterality: N/A;   Allergies  Allergen Reactions   Penicillins Hives    Has patient had a PCN reaction causing immediate rash, facial/tongue/throat swelling, SOB or lightheadedness with hypotension: yes Has patient had a PCN reaction causing severe rash involving mucus membranes or skin necrosis: no Has patient had a PCN reaction that required hospitalization: no Has patient had a PCN reaction occurring within the last 10 years: no If all of the above answers are "NO", then may proceed with Cephalosporin use.       Objective/Physical Exam Neurovascular status intact.  Sutures to the proximal and distal portion of the incision site are intact.  Central portion of the incision site and wound appears to be healing significantly.  Wound measures approximately 1.0 x 1.0 x 0.2 cm.  Granular wound base.  There is a significant reduction in the drainage noted.  Please see above noted pictures overall there has been steady improvement over the past 3 weeks since discharge.  Cavovarus deformity right lower extremity  Radiographic exam RT foot and ankle Orthopedic hardware noted to the base of the first metatarsal.  Cavus foot deformity noted.  Joint space narrowing and collapse of the lateral shoulder of the talus of the tibiotalar joint.  There is some mottled appearance throughout the fibular malleolus which is likely postsurgical versus osteomyelitis.  No fractures identified.  Chronic appearing degenerative changes noted throughout the foot and ankle  Assessment: 1. s/p incision and drainage RT ankle. DOS: 11/13/2021   Plan of Care:  1. Patient was evaluated.   2.  Patient had just met with infectious disease yesterday, 12/12/2021.  Their recommendations are greatly appreciated.  I supported the decision to continue the IV antibiotics until completed 6 weeks postop.  Continue cefazolin IV 2 g every 8 hours 3.  The wound continues to improve weekly.  Continue to paint the  area with Betadine and apply gauze padding with an Ace wrap. 4.  Continue minimal weightbearing with the assistance of crutches 5.  Return to clinic weekly   Edrick Kins, DPM [facility]  Dr. Edrick Kins, DPM    [address], [place] [address]                Office [phone]  Fax [phone]

## 2021-12-14 DIAGNOSIS — M86171 Other acute osteomyelitis, right ankle and foot: Secondary | ICD-10-CM | POA: Diagnosis not present

## 2021-12-15 DIAGNOSIS — M86171 Other acute osteomyelitis, right ankle and foot: Secondary | ICD-10-CM | POA: Diagnosis not present

## 2021-12-16 ENCOUNTER — Telehealth: Payer: Self-pay

## 2021-12-16 ENCOUNTER — Other Ambulatory Visit: Payer: Self-pay | Admitting: *Deleted

## 2021-12-16 ENCOUNTER — Telehealth: Payer: Self-pay | Admitting: Urology

## 2021-12-16 DIAGNOSIS — M86171 Other acute osteomyelitis, right ankle and foot: Secondary | ICD-10-CM | POA: Diagnosis not present

## 2021-12-16 NOTE — Telephone Encounter (Signed)
Discussed labs with patient Inflammatory marker way better Sed Rate 18- was 111. CRP was11 and now2. WBC are WNL but BS elevated to 210. Advised watch the sugar. He asked if he still is on track for end date 12/25/21 for PICC and after I reviewed Dr.Evans note and his last visit I did advise him that we will go to 12/25/21 for his end date and Pull PIcc order will be sent in for after last dose.

## 2021-12-16 NOTE — Telephone Encounter (Signed)
Patient states that he has no more refills on his testosterone medication.  He has appts schedule for 02/03/22 labs and a fu on 02/07/22.  Rx can be sent to [facility] in [place]

## 2021-12-16 NOTE — Telephone Encounter (Signed)
Sent to Dr. Bernardo Heater

## 2021-12-17 DIAGNOSIS — M86171 Other acute osteomyelitis, right ankle and foot: Secondary | ICD-10-CM | POA: Diagnosis not present

## 2021-12-18 DIAGNOSIS — M86171 Other acute osteomyelitis, right ankle and foot: Secondary | ICD-10-CM | POA: Diagnosis not present

## 2021-12-18 DIAGNOSIS — M869 Osteomyelitis, unspecified: Secondary | ICD-10-CM | POA: Diagnosis not present

## 2021-12-18 DIAGNOSIS — Z1211 Encounter for screening for malignant neoplasm of colon: Secondary | ICD-10-CM | POA: Diagnosis not present

## 2021-12-19 DIAGNOSIS — M86171 Other acute osteomyelitis, right ankle and foot: Secondary | ICD-10-CM | POA: Diagnosis not present

## 2021-12-20 ENCOUNTER — Telehealth: Payer: Self-pay | Admitting: Podiatry

## 2021-12-20 ENCOUNTER — Other Ambulatory Visit: Payer: Self-pay

## 2021-12-20 ENCOUNTER — Ambulatory Visit (INDEPENDENT_AMBULATORY_CARE_PROVIDER_SITE_OTHER): Payer: BC Managed Care – PPO | Admitting: Podiatry

## 2021-12-20 ENCOUNTER — Encounter: Payer: Self-pay | Admitting: Podiatry

## 2021-12-20 DIAGNOSIS — Z9889 Other specified postprocedural states: Secondary | ICD-10-CM

## 2021-12-20 DIAGNOSIS — M86171 Other acute osteomyelitis, right ankle and foot: Secondary | ICD-10-CM | POA: Diagnosis not present

## 2021-12-20 MED ORDER — TESTOSTERONE CYPIONATE 200 MG/ML IM SOLN: INTRAMUSCULAR | 0 refills | Status: DC

## 2021-12-20 NOTE — Telephone Encounter (Signed)
Patient called to request that I discuss his foot with Dr Delaine Lame. He thinks that the original plan was to do oral abx once IV abx are finished on 12/25/2021. He wants to verify this is still the plan and asked what if it is closed? Patient also asking what orders are needed to [facility]. I have agreed to send reminder to [facility] regarding pull picc. I will discuss oral abx on Tuesday when I am at [facility] as he said this is not a rush.

## 2021-12-20 NOTE — Progress Notes (Signed)
Subjective:  Patient presents today status post right ankle incision and drainage. DOS: 11/13/2021 secondary to septic joint from complication of lateral ankle stabilization procedure 09/05/2021.  Patient continues to do well.  He states that he has been slightly putting some pressure on his foot with the assistance of the crutches.  He was very frustrated with the PICC line and dressing changes to the right ankle however he understands the importance of both.  Past Medical History:  Diagnosis Date   BPH (benign prostatic hyperplasia)    Bronchitis    Complication of anesthesia    hard to wake up   DM (diabetes mellitus), type 2 ([place])    DVT (deep venous thrombosis) ([place])    post op   ED (erectile dysfunction)    Erythrocytosis 07/04/2020   GERD (gastroesophageal reflux disease)    Hyperlipemia    Hypertension    Hypogonadism in male    Nocturia    Right ankle instability    Sleep apnea    TB lung, latent    Urinary frequency      Past Surgical History:  Procedure Laterality Date   ANKLE ARTHROSCOPY Right 11/13/2021   Procedure: ANKLE ARTHROSCOPY Arthroscopic Irrigation;  Surgeon: [name], DPM;  Location: [facility];  Service: Podiatry;  Laterality: Right;   ANKLE SURGERY Left    torn ligament   CARPAL TUNNEL RELEASE Right 08/2019   COLONOSCOPY  2009   IRRIGATION AND DEBRIDEMENT ABSCESS Right 11/13/2021   Procedure: IRRIGATION AND DEBRIDEMENT ABSCESS;  Surgeon: [name], DPM;  Location: [facility];  Service: Podiatry;  Laterality: Right;   IRRIGATION AND DEBRIDEMENT FOOT Right 11/16/2021   Procedure: IRRIGATION AND DEBRIDEMENT ANKLE;  Surgeon: Edrick Kins, DPM;  Location: [facility];  Service: Podiatry;  Laterality: Right;   KNEE ARTHROSCOPY Left    right ankle ruptured tendon surgery  2008   TRANSURETHRAL RESECTION OF PROSTATE N/A 02/19/2018   Procedure: TRANSURETHRAL RESECTION OF THE PROSTATE (TURP);  Surgeon: [name], MD;  Location: [facility];   Service: Urology;  Laterality: N/A;   Allergies  Allergen Reactions   Penicillins Hives    Has patient had a PCN reaction causing immediate rash, facial/tongue/throat swelling, SOB or lightheadedness with hypotension: yes Has patient had a PCN reaction causing severe rash involving mucus membranes or skin necrosis: no Has patient had a PCN reaction that required hospitalization: no Has patient had a PCN reaction occurring within the last 10 years: no If all of the above answers are "NO", then may proceed with Cephalosporin use.       Objective/Physical Exam Neurovascular status intact.  Incision is healed with exception of the central portion of the incision site and wound appears stable.  Wound measures approximately 1.0 x 1.0 x 0.2 cm.  Granular wound base.    Cavovarus deformity right lower extremity   Assessment: 1. s/p incision and drainage RT ankle. DOS: 11/13/2021 2.  Ulcer right lateral ankle   Plan of Care:  1. Patient was evaluated.  Patient may begin washing and showering and getting the foot wet 2.  Patient is still on the IV antibiotics.  He says that the PICC line is scheduled to take out this coming Wednesday, 12/25/2021. 3.  Cam boot dispensed.  Patient may begin to transition into weightbearing over the next week.  Begin partial weightbearing with the assistance of crutches for the next few days 4.  Aquacel Ag bandages were provided for the patient.  Apply it  every other day 5.  Return to clinic in 10 days   Edrick Kins, DPM [facility]  Dr. Edrick Kins, DPM    [address], [place] [address]                Office [phone]  Fax [phone]

## 2021-12-21 DIAGNOSIS — M86171 Other acute osteomyelitis, right ankle and foot: Secondary | ICD-10-CM | POA: Diagnosis not present

## 2021-12-22 DIAGNOSIS — M86171 Other acute osteomyelitis, right ankle and foot: Secondary | ICD-10-CM | POA: Diagnosis not present

## 2021-12-23 DIAGNOSIS — M86171 Other acute osteomyelitis, right ankle and foot: Secondary | ICD-10-CM | POA: Diagnosis not present

## 2021-12-24 ENCOUNTER — Telehealth: Payer: Self-pay

## 2021-12-24 ENCOUNTER — Other Ambulatory Visit: Payer: Self-pay | Admitting: Infectious Diseases

## 2021-12-24 DIAGNOSIS — M86171 Other acute osteomyelitis, right ankle and foot: Secondary | ICD-10-CM | POA: Diagnosis not present

## 2021-12-24 MED ORDER — CEPHALEXIN 500 MG PO CAPS: 500.0000 mg | ORAL_CAPSULE | Freq: Four times a day (QID) | ORAL | 0 refills | Status: DC

## 2021-12-24 MED ORDER — RIFAMPIN 300 MG PO CAPS: 300.0000 mg | ORAL_CAPSULE | Freq: Two times a day (BID) | ORAL | 0 refills | Status: DC

## 2021-12-24 NOTE — Telephone Encounter (Signed)
Patient asking if after PICC PULL tomorrow will oral Abx be sent in? He also will send picture via mychart of his wound for Dr to view.

## 2021-12-24 NOTE — Telephone Encounter (Signed)
Alerted [facility] that PULL PICC will be tomorrow and we will go on oral abx after.

## 2021-12-24 NOTE — Telephone Encounter (Signed)
Advised on picc pull for tomorrow and to pick up oral abx. Notified [facility] of pull picc order.

## 2021-12-25 DIAGNOSIS — M86171 Other acute osteomyelitis, right ankle and foot: Secondary | ICD-10-CM | POA: Diagnosis not present

## 2021-12-26 ENCOUNTER — Telehealth: Payer: Self-pay

## 2021-12-26 DIAGNOSIS — R195 Other fecal abnormalities: Secondary | ICD-10-CM

## 2021-12-26 LAB — COLOGUARD: COLOGUARD: POSITIVE — AB

## 2021-12-26 NOTE — Telephone Encounter (Signed)
-----   Message from Virginia Crews, MD sent at 12/26/2021  1:12 PM EST ----- Positive cologuard. Needs a colonoscopy to see what triggered the positive test.  Please refer to GI if patient agrees.

## 2021-12-26 NOTE — Telephone Encounter (Signed)
Patient advised. Referral for Colonoscopy placed.

## 2021-12-30 ENCOUNTER — Telehealth: Payer: Self-pay

## 2021-12-30 ENCOUNTER — Other Ambulatory Visit: Payer: Self-pay

## 2021-12-30 DIAGNOSIS — R195 Other fecal abnormalities: Secondary | ICD-10-CM

## 2021-12-30 DIAGNOSIS — Z86010 Personal history of colonic polyps: Secondary | ICD-10-CM

## 2021-12-30 MED ORDER — NA SULFATE-K SULFATE-MG SULF 17.5-3.13-1.6 GM/177ML PO SOLN: 1.0000 | Freq: Once | ORAL | 0 refills | Status: AC

## 2021-12-30 NOTE — Progress Notes (Signed)
Gastroenterology Pre-Procedure Review  Request Date: 01/23/2022 Requesting Physician: Dr. Allen Norris  PATIENT REVIEW QUESTIONS: The patient responded to the following health history questions as indicated:    1. Are you having any GI issues? no 2. Do you have a personal history of Polyps? yes (last colonoscopy) 3. Do you have a family history of Colon Cancer or Polyps? no 4. Diabetes Mellitus? yes (type 1 ) 5. Joint replacements in the past 12 months?no 6. Major health problems in the past 3 months?yes (ankle surgery) 7. Any artificial heart valves, MVP, or defibrillator?no    MEDICATIONS & ALLERGIES:    Patient reports the following regarding taking any anticoagulation/antiplatelet therapy:   Plavix, Coumadin, Eliquis, Xarelto, Lovenox, Pradaxa, Brilinta, or Effient? no Aspirin? no  Patient confirms/reports the following medications:  Current Outpatient Medications  Medication Sig Dispense Refill   cephALEXin (KEFLEX) 500 MG capsule Take 1 capsule (500 mg total) by mouth 4 (four) times daily. 56 capsule 0   dapagliflozin propanediol (FARXIGA) 5 MG TABS tablet Take 5 mg by mouth daily.     diphenhydramine-acetaminophen (TYLENOL PM) 25-500 MG TABS tablet Take 1 tablet by mouth at bedtime as needed. (Patient not taking: Reported on 12/12/2021)     fluticasone (FLONASE) 50 MCG/ACT nasal spray 1-2 sprays daily as needed.  0   gabapentin (NEURONTIN) 300 MG capsule Take 300 mg by mouth daily. (Patient not taking: Reported on 12/12/2021)     hydrochlorothiazide (HYDRODIURIL) 12.5 MG tablet Take 1 tablet (12.5 mg total) by mouth daily. (Patient not taking: Reported on 12/12/2021) 90 tablet 3   lactulose (CHRONULAC) 10 GM/15ML solution Take 30 mLs (20 g total) by mouth 2 (two) times daily as needed for mild constipation or moderate constipation. (Patient not taking: Reported on 12/12/2021) 236 mL 0   lisinopril (ZESTRIL) 40 MG tablet TAKE 1 TABLET BY MOUTH DAILY 90 tablet 0   meloxicam (MOBIC) 15 MG  tablet Take 15 mg by mouth daily. (Patient not taking: Reported on 12/12/2021)     metFORMIN (GLUCOPHAGE XR) 500 MG 24 hr tablet Take 2 tablets (1,000 mg total) by mouth in the morning and at bedtime. 360 tablet 1   MYRBETRIQ 50 MG TB24 tablet Take 50 mg by mouth daily.     NEEDLE, DISP, 21 G (BD SAFETYGLIDE SHIELDED NEEDLE) 21G X 1-1/2" MISC Use 21guage needle to inject Testosterone Cypionate 93ml. 25 each 0   omeprazole (PRILOSEC) 20 MG capsule Take 20 mg by mouth daily.      Protein POWD Take 1 Scoop by mouth daily.     rifampin (RIFADIN) 300 MG capsule Take 1 capsule (300 mg total) by mouth 2 (two) times daily. 28 capsule 0   testosterone cypionate (DEPOTESTOSTERONE CYPIONATE) 200 MG/ML injection INJECT 0.5 ML IN THE MUSCLE EVERY WEEK 10 mL 0   No current facility-administered medications for this visit.    Patient confirms/reports the following allergies:  Allergies  Allergen Reactions   Penicillins Hives    Has patient had a PCN reaction causing immediate rash, facial/tongue/throat swelling, SOB or lightheadedness with hypotension: yes Has patient had a PCN reaction causing severe rash involving mucus membranes or skin necrosis: no Has patient had a PCN reaction that required hospitalization: no Has patient had a PCN reaction occurring within the last 10 years: no If all of the above answers are "NO", then may proceed with Cephalosporin use.     No orders of the defined types were placed in this encounter.   AUTHORIZATION INFORMATION Primary Insurance:  1D#: Group #:  Secondary Insurance: 1D#: Group #:  SCHEDULE INFORMATION: Date: 01/23/2022 Time: Location:[facility]

## 2021-12-31 ENCOUNTER — Other Ambulatory Visit: Payer: Self-pay

## 2021-12-31 ENCOUNTER — Ambulatory Visit (INDEPENDENT_AMBULATORY_CARE_PROVIDER_SITE_OTHER): Payer: BC Managed Care – PPO | Admitting: Podiatry

## 2021-12-31 DIAGNOSIS — Z9889 Other specified postprocedural states: Secondary | ICD-10-CM

## 2021-12-31 NOTE — Progress Notes (Signed)
Subjective:  Patient presents today status post right ankle incision and drainage. DOS: 11/13/2021 secondary to septic joint from complication of lateral ankle stabilization procedure 09/05/2021.  Patient continues to do well.  He presents today with his spouse, Mable Fill.  He completed the PICC line and is currently on oral antibiotics.  He has slowly transitioned off of the crutches and is weightbearing in the cam boot as instructed.  He presents for further treatment and evaluation  Past Medical History:  Diagnosis Date   BPH (benign prostatic hyperplasia)    Bronchitis    Complication of anesthesia    hard to wake up   DM (diabetes mellitus), type 2 ([place])    DVT (deep venous thrombosis) ([place])    post op   ED (erectile dysfunction)    Erythrocytosis 07/04/2020   GERD (gastroesophageal reflux disease)    Hyperlipemia    Hypertension    Hypogonadism in male    Nocturia    Right ankle instability    Sleep apnea    TB lung, latent    Urinary frequency      Past Surgical History:  Procedure Laterality Date   ANKLE ARTHROSCOPY Right 11/13/2021   Procedure: ANKLE ARTHROSCOPY Arthroscopic Irrigation;  Surgeon: [name], DPM;  Location: [facility];  Service: Podiatry;  Laterality: Right;   ANKLE SURGERY Left    torn ligament   CARPAL TUNNEL RELEASE Right 08/2019   COLONOSCOPY  2009   IRRIGATION AND DEBRIDEMENT ABSCESS Right 11/13/2021   Procedure: IRRIGATION AND DEBRIDEMENT ABSCESS;  Surgeon: [name], DPM;  Location: [facility];  Service: Podiatry;  Laterality: Right;   IRRIGATION AND DEBRIDEMENT FOOT Right 11/16/2021   Procedure: IRRIGATION AND DEBRIDEMENT ANKLE;  Surgeon: Edrick Kins, DPM;  Location: [facility];  Service: Podiatry;  Laterality: Right;   KNEE ARTHROSCOPY Left    right ankle ruptured tendon surgery  2008   TRANSURETHRAL RESECTION OF PROSTATE N/A 02/19/2018   Procedure: TRANSURETHRAL RESECTION OF THE PROSTATE (TURP);  Surgeon: [name], MD;   Location: [facility];  Service: Urology;  Laterality: N/A;   Allergies  Allergen Reactions   Penicillins Hives    Has patient had a PCN reaction causing immediate rash, facial/tongue/throat swelling, SOB or lightheadedness with hypotension: yes Has patient had a PCN reaction causing severe rash involving mucus membranes or skin necrosis: no Has patient had a PCN reaction that required hospitalization: no Has patient had a PCN reaction occurring within the last 10 years: no If all of the above answers are "NO", then may proceed with Cephalosporin use.     Objective/Physical Exam Neurovascular status intact.  There is a central eschar noted where the wound was.  It appears to be well adhered.  The peripheral borders are beginning to peel away.  It appears very dry and stable with good healing.  Cavovarus deformity right lower extremity   Assessment: 1. s/p incision and drainage RT ankle. DOS: 11/13/2021 2.  Ulcer right lateral ankle   Plan of Care:  1. Patient was evaluated.  Patient is now completed the round of IV antibiotics.  He is on oral antibiotics 4 times daily as recommended by infectious disease.  Their recommendations are always greatly appreciated 2.  Overall there is significant improvement.  Light debridement of the eschar scab was performed and approximately 50% was debrided away with healthy underlying skin.  The central portion scab was left intact. 3.  The patient may now discontinue the crutches.  Weightbearing in  the cam boot.  The neck step is for the patient to slowly transition out of the cam boot into good supportive shoes and sneakers 4.  Continue Aquacel Ag bandages.  Patient purchased some for home 5.  Return to clinic 1 week    Edrick Kins, DPM [facility]  Dr. Edrick Kins, DPM    [address], [place] [address]                Office [phone]  Fax [phone]

## 2021-12-31 NOTE — Telephone Encounter (Signed)
error 

## 2022-01-02 DIAGNOSIS — E119 Type 2 diabetes mellitus without complications: Secondary | ICD-10-CM | POA: Diagnosis not present

## 2022-01-02 LAB — HM DIABETES EYE EXAM

## 2022-01-03 ENCOUNTER — Telehealth: Payer: Self-pay

## 2022-01-03 ENCOUNTER — Other Ambulatory Visit: Payer: Self-pay | Admitting: Infectious Diseases

## 2022-01-03 MED ORDER — ONDANSETRON HCL 4 MG PO TABS: 4.0000 mg | ORAL_TABLET | Freq: Three times a day (TID) | ORAL | 0 refills | Status: DC | PRN

## 2022-01-03 NOTE — Progress Notes (Signed)
Pt was having nausea and heartburn with rifampin + keflex- sent some zofran 4 mg PO PRN BID- other wise he is feeling good- the wound on the foot has healed completely he says- Follow up appt with podiatrist next Friday and he will complete oral antibiotics on 01/07/22

## 2022-01-03 NOTE — Telephone Encounter (Signed)
Patient called complaining of nausea and heartburn with the rifampin and keflex. Patient would like to know if this is a side effect to the mediation. Patient advised it is after 5 pm and we may not get back to him until Monday.  Please advise.

## 2022-01-08 DIAGNOSIS — M19011 Primary osteoarthritis, right shoulder: Secondary | ICD-10-CM | POA: Diagnosis not present

## 2022-01-10 ENCOUNTER — Other Ambulatory Visit: Payer: Self-pay

## 2022-01-10 ENCOUNTER — Ambulatory Visit (INDEPENDENT_AMBULATORY_CARE_PROVIDER_SITE_OTHER): Payer: BC Managed Care – PPO | Admitting: Podiatry

## 2022-01-10 ENCOUNTER — Encounter: Payer: Self-pay | Admitting: Podiatry

## 2022-01-10 DIAGNOSIS — Z9889 Other specified postprocedural states: Secondary | ICD-10-CM

## 2022-01-10 NOTE — Progress Notes (Signed)
Subjective:  Patient presents today status post right ankle incision and drainage. DOS: 11/13/2021 secondary to septic joint from complication of lateral ankle stabilization procedure 09/05/2021.  Patient continues to do well.  He presents today with his spouse, Mable Fill.  Patient has now completed the PICC line and has taken his last oral antibiotic yesterday.  He continues to do very well.  He has been transitioning out of the cam boot into the hightop sneakers that he was previously wearing prior to surgery.  Past Medical History:  Diagnosis Date   BPH (benign prostatic hyperplasia)    Bronchitis    Complication of anesthesia    hard to wake up   DM (diabetes mellitus), type 2 ([place])    DVT (deep venous thrombosis) ([place])    post op   ED (erectile dysfunction)    Erythrocytosis 07/04/2020   GERD (gastroesophageal reflux disease)    Hyperlipemia    Hypertension    Hypogonadism in male    Nocturia    Right ankle instability    Sleep apnea    TB lung, latent    Urinary frequency      Past Surgical History:  Procedure Laterality Date   ANKLE ARTHROSCOPY Right 11/13/2021   Procedure: ANKLE ARTHROSCOPY Arthroscopic Irrigation;  Surgeon: [name], DPM;  Location: [facility];  Service: Podiatry;  Laterality: Right;   ANKLE SURGERY Left    torn ligament   CARPAL TUNNEL RELEASE Right 08/2019   COLONOSCOPY  2009   IRRIGATION AND DEBRIDEMENT ABSCESS Right 11/13/2021   Procedure: IRRIGATION AND DEBRIDEMENT ABSCESS;  Surgeon: [name], DPM;  Location: [facility];  Service: Podiatry;  Laterality: Right;   IRRIGATION AND DEBRIDEMENT FOOT Right 11/16/2021   Procedure: IRRIGATION AND DEBRIDEMENT ANKLE;  Surgeon: Edrick Kins, DPM;  Location: [facility];  Service: Podiatry;  Laterality: Right;   KNEE ARTHROSCOPY Left    right ankle ruptured tendon surgery  2008   TRANSURETHRAL RESECTION OF PROSTATE N/A 02/19/2018   Procedure: TRANSURETHRAL RESECTION OF THE PROSTATE (TURP);  Surgeon:  [name], MD;  Location: [facility];  Service: Urology;  Laterality: N/A;   Allergies  Allergen Reactions   Penicillins Hives    Has patient had a PCN reaction causing immediate rash, facial/tongue/throat swelling, SOB or lightheadedness with hypotension: yes Has patient had a PCN reaction causing severe rash involving mucus membranes or skin necrosis: no Has patient had a PCN reaction that required hospitalization: no Has patient had a PCN reaction occurring within the last 10 years: no If all of the above answers are "NO", then may proceed with Cephalosporin use.      Objective/Physical Exam Neurovascular status intact.  There continues to be a very stable superficial well adhered central eschar noted where the wound was measuring approximately 3 mm in diameter.  It appears to be well adhered.  The peripheral borders are beginning to peel away.  It appears very dry and stable with good healing.  There is no erythema or edema or concern clinically for infection  Cavovarus deformity right lower extremity   Assessment: 1. s/p incision and drainage RT ankle. DOS: 11/13/2021 2.  Ulcer right lateral ankle   Plan of Care:  1. Patient was evaluated.  Patient is now completed the round of IV and oral antibiotics.   2.  Patient may now discontinue the cam boot.  Recommend that he wears his hightop shoes and sneakers with a Band-Aid to the area daily.  Allow the foot  to air out every evening 3.  Compression ankle sleeve was dispensed to apply and wear daily 4.  Return to clinic in 2 weeks.  At this time we will discuss the patient return to work date if everything is going well which I suspect will since he continues to show significant improvement of the last several weeks.  X-rays next appointment  Edrick Kins, DPM [facility]  Dr. Edrick Kins, DPM    [address], [place] [address]                Office [phone]  Fax [phone]

## 2022-01-21 ENCOUNTER — Other Ambulatory Visit: Payer: Self-pay | Admitting: *Deleted

## 2022-01-21 ENCOUNTER — Telehealth: Payer: Self-pay

## 2022-01-21 DIAGNOSIS — E291 Testicular hypofunction: Secondary | ICD-10-CM

## 2022-01-21 DIAGNOSIS — N401 Enlarged prostate with lower urinary tract symptoms: Secondary | ICD-10-CM

## 2022-01-21 NOTE — Telephone Encounter (Signed)
Called patient back and answered all questions.

## 2022-01-21 NOTE — Telephone Encounter (Signed)
Patient left a voicemail and has a upcoming colonoscopy with Dr. Allen Norris and has some questions about procedure

## 2022-01-23 ENCOUNTER — Encounter: Payer: Self-pay | Admitting: Gastroenterology

## 2022-01-23 ENCOUNTER — Ambulatory Visit: Payer: BC Managed Care – PPO | Admitting: Registered Nurse

## 2022-01-23 ENCOUNTER — Encounter: Payer: Self-pay | Admitting: Oncology

## 2022-01-23 ENCOUNTER — Encounter
Admission: RE | Disposition: A | Discharge status: Home / Self Care | Payer: Self-pay | Source: Home / Self Care | Attending: Gastroenterology

## 2022-01-23 ENCOUNTER — Ambulatory Visit
Admission: RE | Admit: 2022-01-23 | Discharge: 2022-01-23 | Disposition: A | Discharge status: Home / Self Care | Payer: BC Managed Care – PPO | Attending: Gastroenterology | Admitting: Gastroenterology

## 2022-01-23 DIAGNOSIS — Z1211 Encounter for screening for malignant neoplasm of colon: Secondary | ICD-10-CM | POA: Diagnosis not present

## 2022-01-23 DIAGNOSIS — D126 Benign neoplasm of colon, unspecified: Secondary | ICD-10-CM | POA: Diagnosis not present

## 2022-01-23 DIAGNOSIS — G4730 Sleep apnea, unspecified: Secondary | ICD-10-CM | POA: Insufficient documentation

## 2022-01-23 DIAGNOSIS — D125 Benign neoplasm of sigmoid colon: Secondary | ICD-10-CM | POA: Diagnosis not present

## 2022-01-23 DIAGNOSIS — K635 Polyp of colon: Secondary | ICD-10-CM

## 2022-01-23 DIAGNOSIS — E119 Type 2 diabetes mellitus without complications: Secondary | ICD-10-CM | POA: Diagnosis not present

## 2022-01-23 DIAGNOSIS — K641 Second degree hemorrhoids: Secondary | ICD-10-CM | POA: Diagnosis not present

## 2022-01-23 DIAGNOSIS — K219 Gastro-esophageal reflux disease without esophagitis: Secondary | ICD-10-CM | POA: Diagnosis not present

## 2022-01-23 DIAGNOSIS — N400 Enlarged prostate without lower urinary tract symptoms: Secondary | ICD-10-CM | POA: Insufficient documentation

## 2022-01-23 DIAGNOSIS — D122 Benign neoplasm of ascending colon: Secondary | ICD-10-CM | POA: Diagnosis not present

## 2022-01-23 DIAGNOSIS — I10 Essential (primary) hypertension: Secondary | ICD-10-CM | POA: Diagnosis not present

## 2022-01-23 DIAGNOSIS — D123 Benign neoplasm of transverse colon: Secondary | ICD-10-CM | POA: Diagnosis not present

## 2022-01-23 DIAGNOSIS — D124 Benign neoplasm of descending colon: Secondary | ICD-10-CM | POA: Diagnosis not present

## 2022-01-23 DIAGNOSIS — R195 Other fecal abnormalities: Secondary | ICD-10-CM | POA: Diagnosis not present

## 2022-01-23 DIAGNOSIS — Z86010 Personal history of colonic polyps: Secondary | ICD-10-CM

## 2022-01-23 HISTORY — PX: COLONOSCOPY WITH PROPOFOL: SHX5780

## 2022-01-23 LAB — GLUCOSE, CAPILLARY: Glucose-Capillary: 192 mg/dL — ABNORMAL HIGH (ref 70–99)

## 2022-01-23 SURGERY — COLONOSCOPY WITH PROPOFOL
Anesthesia: General

## 2022-01-23 MED ORDER — PROPOFOL 10 MG/ML IV BOLUS
INTRAVENOUS | Status: DC | PRN
Start: 2022-01-23 — End: 2022-01-23
  Administered 2022-01-23: 70 mg via INTRAVENOUS

## 2022-01-23 MED ORDER — PROPOFOL 500 MG/50ML IV EMUL
INTRAVENOUS | Status: DC | PRN
  Administered 2022-01-23: 140 ug/kg/min via INTRAVENOUS

## 2022-01-23 MED ORDER — SODIUM CHLORIDE 0.9 % IV SOLN: INTRAVENOUS | Status: DC

## 2022-01-23 NOTE — Op Note (Signed)
[facility] Gastroenterology Patient Name: Jonathan Fields Procedure Date: 01/23/2022 7:00 AM MRN: [id number] Account #: [id number] Date of Birth: August 05, 1956 Admit Type: Outpatient Age: 66 Room: [facility] ENDO ROOM 4 Gender: Male Note Status: Finalized Instrument Name: Park Meo [id number] Procedure:             Colonoscopy Indications:           Positive Cologuard test Providers:             [name] MD, MD Referring MD:          Dionne Bucy. [name] (Referring MD) Medicines:             Propofol per Anesthesia Complications:         No immediate complications. Procedure:             Pre-Anesthesia Assessment:                        - Prior to the procedure, a History and Physical was                         performed, and patient medications and allergies were                         reviewed. The patient's tolerance of previous                         anesthesia was also reviewed. The risks and benefits                         of the procedure and the sedation options and risks                         were discussed with the patient. All questions were                         answered, and informed consent was obtained. Prior                         Anticoagulants: The patient has taken no previous                         anticoagulant or antiplatelet agents. ASA Grade                         Assessment: II - A patient with mild systemic disease.                         After reviewing the risks and benefits, the patient                         was deemed in satisfactory condition to undergo the                         procedure.                        After obtaining informed consent, the colonoscope was  passed under direct vision. Throughout the procedure,                         the patient's blood pressure, pulse, and oxygen                         saturations were monitored continuously. The                         Colonoscope was  introduced through the anus and                         advanced to the the cecum, identified by appendiceal                         orifice and ileocecal valve. The colonoscopy was                         performed without difficulty. The patient tolerated                         the procedure well. The quality of the bowel                         preparation was excellent. Findings:      The perianal and digital rectal examinations were normal.      A 4 mm polyp was found in the ascending colon. The polyp was sessile.       The polyp was removed with a cold biopsy forceps. Resection and       retrieval were complete.      A 3 mm polyp was found in the transverse colon. The polyp was sessile.       The polyp was removed with a cold biopsy forceps. Resection and       retrieval were complete.      A 3 mm polyp was found in the descending colon. The polyp was sessile.       The polyp was removed with a cold biopsy forceps. Resection and       retrieval were complete.      A 7 mm polyp was found in the sigmoid colon. The polyp was sessile. The       polyp was removed with a cold snare. Resection and retrieval were       complete.      Non-bleeding internal hemorrhoids were found during retroflexion. The       hemorrhoids were Grade II (internal hemorrhoids that prolapse but reduce       spontaneously). Impression:            - One 4 mm polyp in the ascending colon, removed with                         a cold biopsy forceps. Resected and retrieved.                        - One 3 mm polyp in the transverse colon, removed with                         a cold biopsy forceps. Resected and retrieved.                        -  One 3 mm polyp in the descending colon, removed with                         a cold biopsy forceps. Resected and retrieved.                        - One 7 mm polyp in the sigmoid colon, removed with a                         cold snare. Resected and retrieved.                         - Non-bleeding internal hemorrhoids. Recommendation:        - Discharge patient to home.                        - Resume previous diet.                        - Continue present medications.                        - Await pathology results.                        - If the pathology report reveals adenomatous tissue,                         then repeat the colonoscopy for surveillance in 3                         years. Procedure Code(s):     --- Professional ---                        [phone], Colonoscopy, flexible; with removal of                         tumor(s), polyp(s), or other lesion(s) by snare                         technique                        [id number], 52, Colonoscopy, flexible; with biopsy, single                         or multiple Diagnosis Code(s):     --- Professional ---                        R19.5, Other fecal abnormalities                        K63.5, Polyp of colon CPT copyright 2019 American Medical Association. All rights reserved. The codes documented in this report are preliminary and upon coder review may  be revised to meet current compliance requirements. [name] MD, MD 01/23/2022 7:49:27 AM This report has been signed electronically. Number of Addenda: 0 Note Initiated On: 01/23/2022 7:00 AM Scope Withdrawal Time: 0 hours 10 minutes 23 seconds  Total Procedure Duration: 0 hours 13 minutes 19 seconds  Estimated Blood Loss:  Estimated blood loss: none.      [facility]

## 2022-01-23 NOTE — Anesthesia Postprocedure Evaluation (Signed)
Anesthesia Post Note  Patient: Jonathan Fields  Procedure(s) Performed: COLONOSCOPY WITH PROPOFOL  Patient location during evaluation: PACU Anesthesia Type: General Level of consciousness: awake and alert, oriented and patient cooperative Pain management: pain level controlled Vital Signs Assessment: post-procedure vital signs reviewed and stable Respiratory status: spontaneous breathing, nonlabored ventilation and respiratory function stable Cardiovascular status: blood pressure returned to baseline and stable Postop Assessment: adequate PO intake Anesthetic complications: no   No notable events documented.   Last Vitals:  Vitals:   01/23/22 0810 01/23/22 0818  BP: (!) 136/97   Pulse: 77 81  Resp: 17 17  Temp:    SpO2: 96% 97%    Last Pain:  Vitals:   01/23/22 0818  TempSrc:   PainSc: 0-No pain                 Darrin Nipper

## 2022-01-23 NOTE — Anesthesia Preprocedure Evaluation (Addendum)
Anesthesia Evaluation  Patient identified by MRN, date of birth, ID band Patient awake    Reviewed: Allergy & Precautions, NPO status , Patient's Chart, lab work & pertinent test results  History of Anesthesia Complications Negative for: history of anesthetic complications  Airway Mallampati: III   Neck ROM: Full    Dental  (+)    Pulmonary sleep apnea ,  Latent TB   Pulmonary exam normal breath sounds clear to auscultation       Cardiovascular hypertension, Normal cardiovascular exam Rhythm:Regular Rate:Normal     Neuro/Psych negative neurological ROS     GI/Hepatic GERD  ,  Endo/Other  diabetes, Type 2  Renal/GU negative Renal ROS   BPH    Musculoskeletal  (+) Arthritis ,   Abdominal   Peds  Hematology  (+) Blood dyscrasia (erythrocytosis), , Hx DVT postop, no longer on anticoagulation   Anesthesia Other Findings   Reproductive/Obstetrics                            Anesthesia Physical Anesthesia Plan  ASA: 2  Anesthesia Plan: General   Post-op Pain Management:    Induction: Intravenous  PONV Risk Score and Plan: 2 and Propofol infusion, TIVA and Treatment may vary due to age or medical condition  Airway Management Planned: Natural Airway  Additional Equipment:   Intra-op Plan:   Post-operative Plan:   Informed Consent: I have reviewed the patients History and Physical, chart, labs and discussed the procedure including the risks, benefits and alternatives for the proposed anesthesia with the patient or authorized representative who has indicated his/her understanding and acceptance.       Plan Discussed with: CRNA  Anesthesia Plan Comments: (LMA/GETA backup discussed.  Patient consented for risks of anesthesia including but not limited to:  - adverse reactions to medications - damage to eyes, teeth, lips or other oral mucosa - nerve damage due to positioning  -  sore throat or hoarseness - damage to heart, brain, nerves, lungs, other parts of body or loss of life  Informed patient about role of CRNA in peri- and intra-operative care.  Patient voiced understanding.)        Anesthesia Quick Evaluation

## 2022-01-23 NOTE — H&P (Signed)
Jonathan Lame, MD [address] [place],  [address] Phone:[phone] Fax : [phone]  Primary Care Physician:  Virginia Crews, MD Primary Gastroenterologist:  Dr. Allen Norris  Pre-Procedure History & Physical: HPI:  Jonathan Fields is a 66 y.o. male is here for an colonoscopy.   Past Medical History:  Diagnosis Date   BPH (benign prostatic hyperplasia)    Bronchitis    Complication of anesthesia    hard to wake up   DM (diabetes mellitus), type 2 ([place])    DVT (deep venous thrombosis) ([place])    post op   ED (erectile dysfunction)    Erythrocytosis 07/04/2020   GERD (gastroesophageal reflux disease)    Hyperlipemia    Hypertension    Hypogonadism in male    Nocturia    Right ankle instability    Sleep apnea    TB lung, latent    Urinary frequency     Past Surgical History:  Procedure Laterality Date   ANKLE ARTHROSCOPY Right 11/13/2021   Procedure: ANKLE ARTHROSCOPY Arthroscopic Irrigation;  Surgeon: [name], DPM;  Location: [facility];  Service: Podiatry;  Laterality: Right;   ANKLE SURGERY Left    torn ligament   CARPAL TUNNEL RELEASE Right 08/2019   COLONOSCOPY  2009   IRRIGATION AND DEBRIDEMENT ABSCESS Right 11/13/2021   Procedure: IRRIGATION AND DEBRIDEMENT ABSCESS;  Surgeon: [name], DPM;  Location: [facility];  Service: Podiatry;  Laterality: Right;   IRRIGATION AND DEBRIDEMENT FOOT Right 11/16/2021   Procedure: IRRIGATION AND DEBRIDEMENT ANKLE;  Surgeon: Edrick Kins, DPM;  Location: [facility];  Service: Podiatry;  Laterality: Right;   KNEE ARTHROSCOPY Left    right ankle ruptured tendon surgery  2008   TRANSURETHRAL RESECTION OF PROSTATE N/A 02/19/2018   Procedure: TRANSURETHRAL RESECTION OF THE PROSTATE (TURP);  Surgeon: [name], MD;  Location: [facility];  Service: Urology;  Laterality: N/A;    Prior to Admission medications   Medication Sig Start Date End Date Taking? Authorizing Provider  gabapentin  (NEURONTIN) 300 MG capsule Take 300 mg by mouth daily. 11/29/21  Yes [provider]  lisinopril (ZESTRIL) 40 MG tablet TAKE 1 TABLET BY MOUTH DAILY 08/07/21  Yes [name], Dionne Bucy, MD  meloxicam (MOBIC) 15 MG tablet Take 15 mg by mouth daily. 08/29/21  Yes [provider]  metFORMIN (GLUCOPHAGE XR) 500 MG 24 hr tablet Take 2 tablets (1,000 mg total) by mouth in the morning and at bedtime. 10/31/21  Yes [name], Dionne Bucy, MD  omeprazole (PRILOSEC) 20 MG capsule Take 20 mg by mouth daily.    Yes [provider]  cephALEXin (KEFLEX) 500 MG capsule Take 1 capsule (500 mg total) by mouth 4 (four) times daily. Patient not taking: Reported on 01/23/2022 12/24/21   Tsosie Billing, MD  dapagliflozin propanediol (FARXIGA) 5 MG TABS tablet Take 5 mg by mouth daily.    [provider]  diphenhydramine-acetaminophen (TYLENOL PM) 25-500 MG TABS tablet Take 1 tablet by mouth at bedtime as needed.    [provider]  fluticasone (FLONASE) 50 MCG/ACT nasal spray 1-2 sprays daily as needed. 05/28/15   [provider]  hydrochlorothiazide (HYDRODIURIL) 12.5 MG tablet Take 1 tablet (12.5 mg total) by mouth daily. Patient not taking: Reported on 01/23/2022 10/31/21   Virginia Crews, MD  lactulose (CHRONULAC) 10 GM/15ML solution Take 30 mLs (20 g total) by mouth 2 (two) times daily as needed for mild constipation or moderate constipation. Patient  not taking: Reported on 01/23/2022 11/19/21   Barb Merino, MD  MYRBETRIQ 50 MG TB24 tablet Take 50 mg by mouth daily. 11/30/21   [provider]  NEEDLE, DISP, 21 G (BD SAFETYGLIDE SHIELDED NEEDLE) 21G X 1-1/2" MISC Use 21guage needle to inject Testosterone Cypionate 42ml. 11/30/19   McGowan, Larene Beach A, PA-C  ondansetron (ZOFRAN) 4 MG tablet Take 1 tablet (4 mg total) by mouth every 8 (eight) hours as needed for nausea or vomiting. 01/03/22   Tsosie Billing, MD  Protein POWD Take 1 Scoop by mouth  daily.    [provider]  rifampin (RIFADIN) 300 MG capsule Take 1 capsule (300 mg total) by mouth 2 (two) times daily. 12/24/21   Tsosie Billing, MD  testosterone cypionate (DEPOTESTOSTERONE CYPIONATE) 200 MG/ML injection INJECT 0.5 ML IN THE MUSCLE EVERY WEEK 12/20/21   Abbie Sons, MD    Allergies as of 12/31/2021 - Review Complete 12/30/2021  Allergen Reaction Noted   Penicillins Hives 06/20/2015    Family History  Problem Relation Age of Onset   Heart disease Father    Hypertension Father    Prostate cancer Father    Heart disease Mother    Breast cancer Mother    Colon cancer Neg Hx     Social History   Socioeconomic History   Marital status: Married    Spouse name: Not on file   Number of children: 0   Years of education: Not on file   Highest education level: Not on file  Occupational History   Not on file  Tobacco Use   Smoking status: Never   Smokeless tobacco: Current    Types: Snuff   Tobacco comments:    tobaco pouches that produce juice  Vaping Use   Vaping Use: Never used  Substance and Sexual Activity   Alcohol use: Yes    Comment: COUPLE OF DRINKS EVERY WEEK   Drug use: No   Sexual activity: Yes    Partners: Female    Birth control/protection: None  Other Topics Concern   Not on file  Social History Narrative   Not on file   Social Determinants of Health   Financial Resource Strain: Not on file  Food Insecurity: Not on file  Transportation Needs: Not on file  Physical Activity: Not on file  Stress: Not on file  Social Connections: Not on file  Intimate Partner Violence: Not on file    Review of Systems: See HPI, otherwise negative ROS  Physical Exam: BP (!) 131/92    Pulse 90    Temp (!) 96.2 F (35.7 C) (Temporal)    Resp 16    Ht 6\' 2"  (1.88 m)    Wt 97.5 kg    SpO2 98%    BMI 27.60 kg/m  General:   Alert,  pleasant and cooperative in NAD Head:  Normocephalic and atraumatic. Neck:  Supple; no masses or  thyromegaly. Lungs:  Clear throughout to auscultation.    Heart:  Regular rate and rhythm. Abdomen:  Soft, nontender and nondistended. Normal bowel sounds, without guarding, and without rebound.   Neurologic:  Alert and  oriented x4;  grossly normal neurologically.  Impression/Plan: Jonathan Fields is here for an colonoscopy to be performed for positive Cologuard  Risks, benefits, limitations, and alternatives regarding  colonoscopy have been reviewed with the patient.  Questions have been answered.  All parties agreeable.   Jonathan Lame, MD  01/23/2022, 7:50 AM

## 2022-01-23 NOTE — Transfer of Care (Signed)
Immediate Anesthesia Transfer of Care Note  Patient: Jonathan Fields  Procedure(s) Performed: COLONOSCOPY WITH PROPOFOL  Patient Location: PACU  Anesthesia Type:General  Level of Consciousness: awake, alert  and oriented  Airway & Oxygen Therapy: Patient Spontanous Breathing  Post-op Assessment: Report given to RN and Post -op Vital signs reviewed and stable  Post vital signs: Reviewed and stable  Last Vitals:  Vitals Value Taken Time  BP 108/78 01/23/22 0750  Temp    Pulse 77 01/23/22 0750  Resp 19 01/23/22 0750  SpO2 91 % 01/23/22 0750  Vitals shown include unvalidated device data.  Last Pain:  Vitals:   01/23/22 0658  TempSrc: Temporal         Complications: No notable events documented.

## 2022-01-24 ENCOUNTER — Encounter: Payer: Self-pay | Admitting: Gastroenterology

## 2022-01-24 ENCOUNTER — Other Ambulatory Visit: Payer: Self-pay

## 2022-01-24 ENCOUNTER — Ambulatory Visit (INDEPENDENT_AMBULATORY_CARE_PROVIDER_SITE_OTHER): Payer: BC Managed Care – PPO

## 2022-01-24 ENCOUNTER — Ambulatory Visit (INDEPENDENT_AMBULATORY_CARE_PROVIDER_SITE_OTHER): Payer: BC Managed Care – PPO | Admitting: Podiatry

## 2022-01-24 ENCOUNTER — Encounter: Payer: Self-pay | Admitting: *Deleted

## 2022-01-24 DIAGNOSIS — M19071 Primary osteoarthritis, right ankle and foot: Secondary | ICD-10-CM

## 2022-01-24 DIAGNOSIS — Z9889 Other specified postprocedural states: Secondary | ICD-10-CM | POA: Diagnosis not present

## 2022-01-24 LAB — SURGICAL PATHOLOGY

## 2022-01-24 NOTE — Progress Notes (Signed)
Subjective:  Patient presents today status post right ankle incision and drainage. DOS: 11/13/2021 secondary to septic joint from complication of lateral ankle stabilization procedure 09/05/2021.  Patient has now been off of the oral antibiotics for about 3 weeks now.  He completed a course of PICC line and oral antibiotics as per ID.  Patient states that now he is back to normal activities prior to surgery.  Although he does have instability of the ankle he is grateful and happy that the infection and surgical complications are resolved.  Presenting for final follow-up treatment evaluation  Past Medical History:  Diagnosis Date   BPH (benign prostatic hyperplasia)    Bronchitis    Complication of anesthesia    hard to wake up   DM (diabetes mellitus), type 2 ([place])    DVT (deep venous thrombosis) ([place])    post op   ED (erectile dysfunction)    Erythrocytosis 07/04/2020   GERD (gastroesophageal reflux disease)    Hyperlipemia    Hypertension    Hypogonadism in male    Nocturia    Right ankle instability    Sleep apnea    TB lung, latent    Urinary frequency      Past Surgical History:  Procedure Laterality Date   ANKLE ARTHROSCOPY Right 11/13/2021   Procedure: ANKLE ARTHROSCOPY Arthroscopic Irrigation;  Surgeon: [name], DPM;  Location: [facility];  Service: Podiatry;  Laterality: Right;   ANKLE SURGERY Left    torn ligament   CARPAL TUNNEL RELEASE Right 08/2019   COLONOSCOPY  2009   COLONOSCOPY WITH PROPOFOL N/A 01/23/2022   Procedure: COLONOSCOPY WITH PROPOFOL;  Surgeon: [name], MD;  Location: [facility];  Service: Endoscopy;  Laterality: N/A;   IRRIGATION AND DEBRIDEMENT ABSCESS Right 11/13/2021   Procedure: IRRIGATION AND DEBRIDEMENT ABSCESS;  Surgeon: [name], DPM;  Location: [facility];  Service: Podiatry;  Laterality: Right;   IRRIGATION AND DEBRIDEMENT FOOT Right 11/16/2021   Procedure: IRRIGATION AND DEBRIDEMENT ANKLE;  Surgeon: Edrick Kins,  DPM;  Location: [facility];  Service: Podiatry;  Laterality: Right;   KNEE ARTHROSCOPY Left    right ankle ruptured tendon surgery  2008   TRANSURETHRAL RESECTION OF PROSTATE N/A 02/19/2018   Procedure: TRANSURETHRAL RESECTION OF THE PROSTATE (TURP);  Surgeon: [name], MD;  Location: [facility];  Service: Urology;  Laterality: N/A;   Allergies  Allergen Reactions   Penicillins Hives    Has patient had a PCN reaction causing immediate rash, facial/tongue/throat swelling, SOB or lightheadedness with hypotension: yes Has patient had a PCN reaction causing severe rash involving mucus membranes or skin necrosis: no Has patient had a PCN reaction that required hospitalization: no Has patient had a PCN reaction occurring within the last 10 years: no If all of the above answers are "NO", then may proceed with Cephalosporin use.       Objective/Physical Exam Neurovascular status intact.  The superficial eschar noted to the central portion of the wound was debrided today.  There is no underlying wound or drainage.  There is some slight hyperkeratotic skin around the area.  No edema noted.  Erythema resolved.  Chronic ankle instability remains especially with weightbearing. Cavovarus deformity right lower extremity   Assessment: 1. s/p incision and drainage RT ankle. DOS: 11/13/2021 2.  Ulcer right lateral ankle; resolved   Plan of Care:  1. Patient was evaluated.  Patient is now completed the round of IV and oral antibiotics and has been off  of oral antibiotic therapy for about 3 weeks 2.  Continue good supportive sneakers.  Advised the patient to be very cautious when changing shoe gear or wearing ankle braces around the ankle that they do not rub or irritate the incision site 3.  Follow-up x-rays reviewed.  There is cortical irregularity and erosion of the lateral aspect of the talus as well as the fibular malleolus likely secondary to surgical debridement versus osteomyelitis. 4.  New  blood work was ordered today including CRP, sed rate, A1c.  Patient has an appointment next month with PCP for additional blood work 5.  I would like for the patient to return to clinic in 1 month for final follow-up x-rays and evaluation  *Spouse's name is Marissa  Edrick Kins, DPM [facility]  Dr. Edrick Kins, DPM    [address], [place] [address]                Office [phone]  Fax [phone]

## 2022-01-25 LAB — HEMOGLOBIN A1C
Est. average glucose Bld gHb Est-mCnc: 192 mg/dL
Hgb A1c MFr Bld: 8.3 % — ABNORMAL HIGH (ref 4.8–5.6)

## 2022-01-25 LAB — C-REACTIVE PROTEIN: CRP: <1 mg/L (ref 0–10)

## 2022-01-25 LAB — SEDIMENTATION RATE: Sed Rate: 2 mm/hr (ref 0–30)

## 2022-01-30 ENCOUNTER — Ambulatory Visit: Payer: BC Managed Care – PPO | Admitting: Family Medicine

## 2022-01-30 NOTE — Progress Notes (Deleted)
?  ? ? ?Established patient visit ? ? ?Patient: Jonathan Fields   DOB: [birth year]   66 y.o. Male  MRN: [id number] ?Visit Date: 01/30/2022 ? ?Today's healthcare provider: Lavon Paganini, MD  ? ?No chief complaint on file. ? ?Subjective  ?  ?HPI  ?Hypertension, follow-up ? ?BP Readings from Last 3 Encounters:  ?01/23/22 (!) 136/97  ?12/12/21 110/76  ?11/19/21 (!) 144/80  ? Wt Readings from Last 3 Encounters:  ?01/23/22 215 lb (97.5 kg)  ?12/12/21 225 lb (102.1 kg)  ?11/13/21 225 lb 1.4 oz (102.1 kg)  ?  ? ?He was last seen for hypertension 3 months ago.  ?BP at that visit was 158/99. Management since that visit includes; Chronic and uncontrolled, will resume 40mg  of lisinopril in addition to HCTZ. Asked to monitor home blood pressures.  ? ?He reports {excellent/good/fair/poor:19665} compliance with treatment. ?He {is/is not:9024} having side effects. {document side effects if present:1} ?He is following a {diet:21022986} diet. ?He {is/is not:9024} exercising. ?He {does/does not:200015} smoke ? ? ?Outside blood pressures are {***enter patient reported home BP readings, or 'not being checked':1}. ? ? ?Pertinent labs: ?Lab Results  ?Component Value Date  ? CHOL 222 (H) 11/04/2021  ? HDL 36 (L) 11/04/2021  ? LDLCALC 154 (H) 11/04/2021  ? TRIG 177 (H) 11/04/2021  ? CHOLHDL 6.2 (H) 11/04/2021  ? Lab Results  ?Component Value Date  ? NA 133 (L) 11/18/2021  ? K 4.0 11/18/2021  ? CREATININE 0.86 11/18/2021  ? GFRNONAA >60 11/18/2021  ? GLUCOSE 175 (H) 11/18/2021  ? TSH 2.280 04/01/2021  ?  ? ?The 10-year ASCVD risk score (Arnett DK, et al., 2019) is: 40.3%  ? ?--------------------------------------------------------------------------------------------------- ?Diabetes Mellitus Type II, Follow-up ? ?Lab Results  ?Component Value Date  ? HGBA1C 8.3 (H) 01/24/2022  ? HGBA1C 9.5 (H) 11/04/2021  ? HGBA1C 7.8 (H) 04/01/2021  ? ?Wt Readings from Last 3 Encounters:  ?01/23/22 215 lb (97.5 kg)  ?12/12/21 225 lb (102.1 kg)   ?11/13/21 225 lb 1.4 oz (102.1 kg)  ? ?Last seen for diabetes 3 months ago.  ?Management since then includes; Previously uncontrolled with hyperglycemia ?He is still taking his metformin currently, but he discontinued Jardiance as this caused weight loss. ? ?He reports {excellent/good/fair/poor:19665} compliance with treatment. ?He {is/is not:21021397} having side effects. {document side effects if present:1} ? ?Home blood sugar records: {diabetes glucometry results:16657} ? ?Episodes of hypoglycemia? {Yes/No:20286} {enter symptoms and frequency of symptoms if yes:1} ?  ?Current insulin regiment: {enter 'none' or type of insulin and number of units taken with each dose of each insulin formulation that the patient is taking:1} ?Most Recent Eye Exam: *** ?{Current exercise:16438:::1} ?{Current diet habits:16563:::1} ? ?Pertinent Labs: ?Lab Results  ?Component Value Date  ? CHOL 222 (H) 11/04/2021  ? HDL 36 (L) 11/04/2021  ? LDLCALC 154 (H) 11/04/2021  ? TRIG 177 (H) 11/04/2021  ? CHOLHDL 6.2 (H) 11/04/2021  ? Lab Results  ?Component Value Date  ? NA 133 (L) 11/18/2021  ? K 4.0 11/18/2021  ? CREATININE 0.86 11/18/2021  ? GFRNONAA >60 11/18/2021  ? LABMICR See below: 01/25/2020  ?  ? ?--------------------------------------------------------------------------------------------------- ?Lipid/Cholesterol, Follow-up ? ?Last lipid panel Other pertinent labs  ?Lab Results  ?Component Value Date  ? CHOL 222 (H) 11/04/2021  ? HDL 36 (L) 11/04/2021  ? LDLCALC 154 (H) 11/04/2021  ? TRIG 177 (H) 11/04/2021  ? CHOLHDL 6.2 (H) 11/04/2021  ? Lab Results  ?Component Value Date  ? ALT 17 11/10/2021  ? AST 16  11/10/2021  ? PLT 294 11/18/2021  ? TSH 2.280 04/01/2021  ?  ? ?He was last seen for this 3 months ago.  ?Management since that visit includes;Previously uncontrolled, He has self discontinued his statin . ? ?He reports {excellent/good/fair/poor:19665} compliance with treatment. ?He {is/is not:9024} having side effects. {document  side effects if present:1} ?Current diet: {diet habits:16563} ?Current exercise: {exercise types:16438} ? ?The 10-year ASCVD risk score (Arnett DK, et al., 2019) is: 40.3% ? ?--------------------------------------------------------------------------------------------------- ? ?Medications: ?Outpatient Medications Prior to Visit  ?Medication Sig  ? cephALEXin (KEFLEX) 500 MG capsule Take 1 capsule (500 mg total) by mouth 4 (four) times daily. (Patient not taking: Reported on 01/23/2022)  ? dapagliflozin propanediol (FARXIGA) 5 MG TABS tablet Take 5 mg by mouth daily.  ? diphenhydramine-acetaminophen (TYLENOL PM) 25-500 MG TABS tablet Take 1 tablet by mouth at bedtime as needed.  ? fluticasone (FLONASE) 50 MCG/ACT nasal spray 1-2 sprays daily as needed.  ? gabapentin (NEURONTIN) 300 MG capsule Take 300 mg by mouth daily.  ? hydrochlorothiazide (HYDRODIURIL) 12.5 MG tablet Take 1 tablet (12.5 mg total) by mouth daily. (Patient not taking: Reported on 01/23/2022)  ? lactulose (CHRONULAC) 10 GM/15ML solution Take 30 mLs (20 g total) by mouth 2 (two) times daily as needed for mild constipation or moderate constipation. (Patient not taking: Reported on 01/23/2022)  ? lisinopril (ZESTRIL) 40 MG tablet TAKE 1 TABLET BY MOUTH DAILY  ? meloxicam (MOBIC) 15 MG tablet Take 15 mg by mouth daily.  ? metFORMIN (GLUCOPHAGE XR) 500 MG 24 hr tablet Take 2 tablets (1,000 mg total) by mouth in the morning and at bedtime.  ? MYRBETRIQ 50 MG TB24 tablet Take 50 mg by mouth daily.  ? NEEDLE, DISP, 21 G (BD SAFETYGLIDE SHIELDED NEEDLE) 21G X 1-1/2" MISC Use 21guage needle to inject Testosterone Cypionate 60ml.  ? omeprazole (PRILOSEC) 20 MG capsule Take 20 mg by mouth daily.   ? ondansetron (ZOFRAN) 4 MG tablet Take 1 tablet (4 mg total) by mouth every 8 (eight) hours as needed for nausea or vomiting.  ? Protein POWD Take 1 Scoop by mouth daily.  ? rifampin (RIFADIN) 300 MG capsule Take 1 capsule (300 mg total) by mouth 2 (two) times daily.  ?  testosterone cypionate (DEPOTESTOSTERONE CYPIONATE) 200 MG/ML injection INJECT 0.5 ML IN THE MUSCLE EVERY WEEK  ? ?No facility-administered medications prior to visit.  ? ? ?Review of Systems  ?All other systems reviewed and are negative. ? ?{Labs  Heme  Chem  Endocrine  Serology  Results Review (optional):23779} ?  Objective  ?  ?There were no vitals taken for this visit. ?{Show previous vital signs (optional):23777} ? ?Physical Exam  ?*** ? ?No results found for any visits on 01/30/22. ? Assessment & Plan  ?  ? ?*** ? ?No follow-ups on file.  ?   ? ?{provider attestation***:1} ? ? ?Lavon Paganini, MD  ?[facility] ?[phone] (phone) ?[phone] (fax) ? ?[facility] ?

## 2022-02-03 ENCOUNTER — Other Ambulatory Visit: Payer: BC Managed Care – PPO

## 2022-02-03 ENCOUNTER — Other Ambulatory Visit: Payer: Self-pay

## 2022-02-03 ENCOUNTER — Ambulatory Visit: Payer: BC Managed Care – PPO | Admitting: Family Medicine

## 2022-02-03 DIAGNOSIS — N401 Enlarged prostate with lower urinary tract symptoms: Secondary | ICD-10-CM | POA: Diagnosis not present

## 2022-02-03 DIAGNOSIS — E291 Testicular hypofunction: Secondary | ICD-10-CM

## 2022-02-04 LAB — TESTOSTERONE: Testosterone: 83 ng/dL — ABNORMAL LOW (ref 264–916)

## 2022-02-04 LAB — PSA: Prostate Specific Ag, Serum: 1 ng/mL (ref 0.0–4.0)

## 2022-02-04 LAB — HEMATOCRIT: Hematocrit: 46.9 % (ref 37.5–51.0)

## 2022-02-06 ENCOUNTER — Ambulatory Visit: Payer: Self-pay | Admitting: Urology

## 2022-02-07 ENCOUNTER — Encounter: Payer: Self-pay | Admitting: Urology

## 2022-02-07 ENCOUNTER — Other Ambulatory Visit: Payer: Self-pay

## 2022-02-07 ENCOUNTER — Ambulatory Visit: Payer: BC Managed Care – PPO | Admitting: Urology

## 2022-02-07 VITALS — BP 130/72 | HR 78 | Ht 74.0 in | Wt 215.0 lb

## 2022-02-07 DIAGNOSIS — N401 Enlarged prostate with lower urinary tract symptoms: Secondary | ICD-10-CM

## 2022-02-07 DIAGNOSIS — E291 Testicular hypofunction: Secondary | ICD-10-CM

## 2022-02-07 MED ORDER — ALFUZOSIN HCL ER 10 MG PO TB24: 10.0000 mg | ORAL_TABLET | Freq: Every day | ORAL | 11 refills | Status: DC

## 2022-02-07 NOTE — Progress Notes (Signed)
? ?02/07/2022 ?10:43 AM  ? ?Jonathan Fields ?Nov 17, 1956 ?[id number] ? ?Referring provider: Virginia Crews, MD ?[address] ?[address] ?[place],  [place] [address] ? ?Chief Complaint  ?Patient presents with  ? Hypogonadism  ? ? ?Urologic history: ?1.  BPH with lower urinary tract symptoms ?            -Prior medical management ?            -UDS consistent with obstruction. ?            -TURP by Dr. Pilar Jarvis 01/2018; 7 g, benign  ?  ?2.  Hypogonadism  ?            -On TRT ? ? ?HPI: ?66 y.o. male presents for annual follow-up. ? ?Noted worsening frequency, urgency and nocturia at last years visit and started on Myrbetriq which she feels has helped ?He discontinued tamsulosin because he thought he was having increased frequency with this medication ?Has nocturia and has been diagnosed with sleep apnea and unable to tolerate CPAP ?IPSS today 16/35 with storage related voiding symptoms most bothersome ?Currently only taking Myrbetriq ?Had ankle surgery October 2585 complicated by osteomyelitis and septic arthritis.  Was on antibiotics via PICC line x6 weeks.  He discontinued testosterone over this time and has not restarted ?Labs 02/03/2022: Testosterone 83; hematocrit 46.9, PSA 1.0 ? ? ?PMH: ?Past Medical History:  ?Diagnosis Date  ? BPH (benign prostatic hyperplasia)   ? Bronchitis   ? Complication of anesthesia   ? hard to wake up  ? DM (diabetes mellitus), type 2 ([place])   ? DVT (deep venous thrombosis) ([place])   ? post op  ? ED (erectile dysfunction)   ? Erythrocytosis 07/04/2020  ? GERD (gastroesophageal reflux disease)   ? Hyperlipemia   ? Hypertension   ? Hypogonadism in male   ? Nocturia   ? Right ankle instability   ? Sleep apnea   ? TB lung, latent   ? Urinary frequency   ? ? ?Surgical History: ?Past Surgical History:  ?Procedure Laterality Date  ? ANKLE ARTHROSCOPY Right 11/13/2021  ? Procedure: ANKLE ARTHROSCOPY Arthroscopic Irrigation;  Surgeon: [name], DPM;  Location: [facility];  Service: Podiatry;   Laterality: Right;  ? ANKLE SURGERY Left   ? torn ligament  ? CARPAL TUNNEL RELEASE Right 08/2019  ? COLONOSCOPY  2009  ? COLONOSCOPY WITH PROPOFOL N/A 01/23/2022  ? Procedure: COLONOSCOPY WITH PROPOFOL;  Surgeon: [name], MD;  Location: [facility];  Service: Endoscopy;  Laterality: N/A;  ? IRRIGATION AND DEBRIDEMENT ABSCESS Right 11/13/2021  ? Procedure: IRRIGATION AND DEBRIDEMENT ABSCESS;  Surgeon: [name], DPM;  Location: [facility];  Service: Podiatry;  Laterality: Right;  ? IRRIGATION AND DEBRIDEMENT FOOT Right 11/16/2021  ? Procedure: IRRIGATION AND DEBRIDEMENT ANKLE;  Surgeon: Edrick Kins, DPM;  Location: [facility];  Service: Podiatry;  Laterality: Right;  ? KNEE ARTHROSCOPY Left   ? right ankle ruptured tendon surgery  2008  ? TRANSURETHRAL RESECTION OF PROSTATE N/A 02/19/2018  ? Procedure: TRANSURETHRAL RESECTION OF THE PROSTATE (TURP);  Surgeon: [name], MD;  Location: [facility];  Service: Urology;  Laterality: N/A;  ? ? ?Home Medications:  ?Allergies as of 02/07/2022   ? ?   Reactions  ? Penicillins Hives  ? Has patient had a PCN reaction causing immediate rash, facial/tongue/throat swelling, SOB or lightheadedness with hypotension: yes ?Has patient had a PCN reaction causing severe rash involving mucus membranes or skin necrosis:  no ?Has patient had a PCN reaction that required hospitalization: no ?Has patient had a PCN reaction occurring within the last 10 years: no ?If all of the above answers are "NO", then may proceed with Cephalosporin use.  ? ?  ? ?  ?Medication List  ?  ? ?  ? Accurate as of February 07, 2022 10:43 AM. If you have any questions, ask your nurse or doctor.  ?  ?  ? ?  ? ?STOP taking these medications   ? ?cephALEXin 500 MG capsule ?Commonly known as: Keflex ?Stopped by: Abbie Sons, MD ?  ? ?  ? ?TAKE these medications   ? ?BD SafetyGlide Shielded Needle 21G X 1-1/2" Misc ?Generic drug: NEEDLE (DISP) 21 G ?Use 21guage needle to inject Testosterone  Cypionate 65m. ?  ?dapagliflozin propanediol 5 MG Tabs tablet ?Commonly known as: FARXIGA ?Take 5 mg by mouth daily. ?  ?diphenhydramine-acetaminophen 25-500 MG Tabs tablet ?Commonly known as: TYLENOL PM ?Take 1 tablet by mouth at bedtime as needed. ?  ?fluticasone 50 MCG/ACT nasal spray ?Commonly known as: FLONASE ?1-2 sprays daily as needed. ?  ?gabapentin 300 MG capsule ?Commonly known as: NEURONTIN ?Take 300 mg by mouth daily. ?  ?hydrochlorothiazide 12.5 MG tablet ?Commonly known as: HYDRODIURIL ?Take 1 tablet (12.5 mg total) by mouth daily. ?  ?lactulose 10 GM/15ML solution ?Commonly known as: [place]?Take 30 mLs (20 g total) by mouth 2 (two) times daily as needed for mild constipation or moderate constipation. ?  ?lisinopril 40 MG tablet ?Commonly known as: ZESTRIL ?TAKE 1 TABLET BY MOUTH DAILY ?  ?meloxicam 15 MG tablet ?Commonly known as: MOBIC ?Take 15 mg by mouth daily. ?  ?metFORMIN 500 MG 24 hr tablet ?Commonly known as: Glucophage XR ?Take 2 tablets (1,000 mg total) by mouth in the morning and at bedtime. ?  ?Myrbetriq 50 MG Tb24 tablet ?Generic drug: mirabegron ER ?Take 50 mg by mouth daily. ?  ?omeprazole 20 MG capsule ?Commonly known as: PRILOSEC ?Take 20 mg by mouth daily. ?  ?ondansetron 4 MG tablet ?Commonly known as: ZOFRAN ?Take 1 tablet (4 mg total) by mouth every 8 (eight) hours as needed for nausea or vomiting. ?  ?Protein Powd ?Take 1 Scoop by mouth daily. ?  ?rifampin 300 MG capsule ?Commonly known as: RIFADIN ?Take 1 capsule (300 mg total) by mouth 2 (two) times daily. ?  ?testosterone cypionate 200 MG/ML injection ?Commonly known as: DEPOTESTOSTERONE CYPIONATE ?INJECT 0.5 ML IN THE MUSCLE EVERY WEEK ?  ? ?  ? ? ?Allergies:  ?Allergies  ?Allergen Reactions  ? Penicillins Hives  ?  Has patient had a PCN reaction causing immediate rash, facial/tongue/throat swelling, SOB or lightheadedness with hypotension: yes ?Has patient had a PCN reaction causing severe rash involving mucus membranes  or skin necrosis: no ?Has patient had a PCN reaction that required hospitalization: no ?Has patient had a PCN reaction occurring within the last 10 years: no ?If all of the above answers are "NO", then may proceed with Cephalosporin use. ?  ? ? ?Family History: ?Family History  ?Problem Relation Age of Onset  ? Heart disease Father   ? Hypertension Father   ? Prostate cancer Father   ? Heart disease Mother   ? Breast cancer Mother   ? Colon cancer Neg Hx   ? ? ?Social History:  reports that he has never smoked. His smokeless tobacco use includes snuff. He reports current alcohol use. He reports that he does not use  drugs. ? ? ?Physical Exam: ?BP 130/72   Pulse 78   Ht '6\' 2"'$  (1.88 m)   Wt 215 lb (97.5 kg)   BMI 27.60 kg/m?   ?Constitutional:  Alert and oriented, No acute distress. ?HEENT: [place] AT, moist mucus membranes.  Trachea midline, no masses. ?Cardiovascular: No clubbing, cyanosis, or edema. ?Respiratory: Normal respiratory effort, no increased work of breathing. ?Neurologic: Grossly intact, no focal deficits, moving all 4 extremities. ?Psychiatric: Normal mood and affect. ? ? ?Assessment & Plan:   ? ?1.  BPH with LUTS ?Continue Myrbetriq ?Add alfuzosin 10 mg daily ? ?2.  Hypogonadism ?He desires to restart an Rx sent to pharmacy ?Lab visit 6 weeks for testosterone level ?Lab visit 6 months testosterone, hematocrit ?Office visit 1 year testosterone, hematocrit, PSA ? ? ? ?Abbie Sons, MD ? ?[place] ?[address] ?[place], [place] [address] ?[phone] ? ?

## 2022-02-10 ENCOUNTER — Ambulatory Visit (INDEPENDENT_AMBULATORY_CARE_PROVIDER_SITE_OTHER): Payer: BC Managed Care – PPO | Admitting: Vascular Surgery

## 2022-02-10 ENCOUNTER — Other Ambulatory Visit: Payer: Self-pay

## 2022-02-10 ENCOUNTER — Encounter (INDEPENDENT_AMBULATORY_CARE_PROVIDER_SITE_OTHER): Payer: Self-pay | Admitting: Vascular Surgery

## 2022-02-10 VITALS — BP 165/84 | HR 105 | Resp 16 | Ht 74.0 in | Wt 218.0 lb

## 2022-02-10 DIAGNOSIS — K219 Gastro-esophageal reflux disease without esophagitis: Secondary | ICD-10-CM | POA: Diagnosis not present

## 2022-02-10 DIAGNOSIS — E1169 Type 2 diabetes mellitus with other specified complication: Secondary | ICD-10-CM

## 2022-02-10 DIAGNOSIS — I872 Venous insufficiency (chronic) (peripheral): Secondary | ICD-10-CM | POA: Diagnosis not present

## 2022-02-10 DIAGNOSIS — I8311 Varicose veins of right lower extremity with inflammation: Secondary | ICD-10-CM

## 2022-02-10 DIAGNOSIS — I10 Essential (primary) hypertension: Secondary | ICD-10-CM

## 2022-02-10 DIAGNOSIS — I8312 Varicose veins of left lower extremity with inflammation: Secondary | ICD-10-CM

## 2022-02-16 ENCOUNTER — Encounter (INDEPENDENT_AMBULATORY_CARE_PROVIDER_SITE_OTHER): Payer: Self-pay | Admitting: Vascular Surgery

## 2022-02-16 NOTE — Progress Notes (Signed)
MRN : [id number]  Jonathan Fields is a 66 y.o. (09-23-1956) male who presents with chief complaint of painful varicose veins.  History of Present Illness:   The patient returns for followup evaluation 3 months after the initial visit. The patient continues to have pain in the lower extremities with dependency. The pain is lessened with elevation. Graduated compression stockings, Class I (20-30 mmHg), have been worn but the stockings do not eliminate the leg pain. Over-the-counter analgesics do not improve the symptoms. The degree of discomfort continues to interfere with daily activities. The patient notes the pain in the legs is causing problems with daily exercise, at the workplace and even with household activities and maintenance such as standing in the kitchen preparing meals and doing dishes.   Venous ultrasound shows patent deep venous system, no evidence of acute or chronic DVT.  Deep venous reflux is noted bilaterally.  Superficial reflux is not present in the bilateral great saphenous veins   Current Meds  Medication Sig   alfuzosin (UROXATRAL) 10 MG 24 hr tablet Take 1 tablet (10 mg total) by mouth daily with breakfast.   diphenhydramine-acetaminophen (TYLENOL PM) 25-500 MG TABS tablet Take 1 tablet by mouth at bedtime as needed.   fluticasone (FLONASE) 50 MCG/ACT nasal spray 1-2 sprays daily as needed.   lisinopril (ZESTRIL) 40 MG tablet TAKE 1 TABLET BY MOUTH DAILY   meloxicam (MOBIC) 15 MG tablet Take 15 mg by mouth daily.   metFORMIN (GLUCOPHAGE XR) 500 MG 24 hr tablet Take 2 tablets (1,000 mg total) by mouth in the morning and at bedtime.   MYRBETRIQ 50 MG TB24 tablet Take 50 mg by mouth daily.   NEEDLE, DISP, 21 G (BD SAFETYGLIDE SHIELDED NEEDLE) 21G X 1-1/2" MISC Use 21guage needle to inject Testosterone Cypionate 1ml.   omeprazole (PRILOSEC) 20 MG capsule Take 20 mg by mouth daily.   Protein POWD Take 1 Scoop by mouth daily.   testosterone cypionate (DEPOTESTOSTERONE  CYPIONATE) 200 MG/ML injection INJECT 0.5 ML IN THE MUSCLE EVERY WEEK    Past Medical History:  Diagnosis Date   BPH (benign prostatic hyperplasia)    Bronchitis    Complication of anesthesia    hard to wake up   DM (diabetes mellitus), type 2 ([facility])    DVT (deep venous thrombosis) ([facility])    post op   ED (erectile dysfunction)    Erythrocytosis 07/04/2020   GERD (gastroesophageal reflux disease)    Hyperlipemia    Hypertension    Hypogonadism in male    Nocturia    Right ankle instability    Sleep apnea    TB lung, latent    Urinary frequency     Past Surgical History:  Procedure Laterality Date   ANKLE ARTHROSCOPY Right 11/13/2021   Procedure: ANKLE ARTHROSCOPY Arthroscopic Irrigation;  Surgeon: Edwin Cap, DPM;  Location: [facility];  Service: Podiatry;  Laterality: Right;   ANKLE SURGERY Left    torn ligament   CARPAL TUNNEL RELEASE Right 08/2019   COLONOSCOPY  2009   COLONOSCOPY WITH PROPOFOL N/A 01/23/2022   Procedure: COLONOSCOPY WITH PROPOFOL;  Surgeon: Midge Minium, MD;  Location: [facility] ENDOSCOPY;  Service: Endoscopy;  Laterality: N/A;   IRRIGATION AND DEBRIDEMENT ABSCESS Right 11/13/2021   Procedure: IRRIGATION AND DEBRIDEMENT ABSCESS;  Surgeon: Edwin Cap, DPM;  Location: [facility];  Service: Podiatry;  Laterality: Right;   IRRIGATION AND DEBRIDEMENT FOOT Right 11/16/2021   Procedure: IRRIGATION AND DEBRIDEMENT ANKLE;  Surgeon: Gala Lewandowsky  M, DPM;  Location: [facility];  Service: Podiatry;  Laterality: Right;   KNEE ARTHROSCOPY Left    right ankle ruptured tendon surgery  2008   TRANSURETHRAL RESECTION OF PROSTATE N/A 02/19/2018   Procedure: TRANSURETHRAL RESECTION OF THE PROSTATE (TURP);  Surgeon: Hildred Laser, MD;  Location: [facility];  Service: Urology;  Laterality: N/A;    Social History Social History   Tobacco Use   Smoking status: Never   Smokeless tobacco: Current    Types: Snuff   Tobacco comments:    tobaco pouches that produce juice   Vaping Use   Vaping Use: Never used  Substance Use Topics   Alcohol use: Yes    Comment: COUPLE OF DRINKS EVERY WEEK   Drug use: No    Family History Family History  Problem Relation Age of Onset   Heart disease Father    Hypertension Father    Prostate cancer Father    Heart disease Mother    Breast cancer Mother    Colon cancer Neg Hx     Allergies  Allergen Reactions   Penicillins Hives    Has patient had a PCN reaction causing immediate rash, facial/tongue/throat swelling, SOB or lightheadedness with hypotension: yes Has patient had a PCN reaction causing severe rash involving mucus membranes or skin necrosis: no Has patient had a PCN reaction that required hospitalization: no Has patient had a PCN reaction occurring within the last 10 years: no If all of the above answers are "NO", then may proceed with Cephalosporin use.      REVIEW OF SYSTEMS (Negative unless checked)  Constitutional: [] Weight loss  [] Fever  [] Chills Cardiac: [] Chest pain   [] Chest pressure   [] Palpitations   [] Shortness of breath when laying flat   [] Shortness of breath with exertion. Vascular:  [] Pain in legs with walking   [] Pain in legs at rest  [] History of DVT   [] Phlebitis   [x] Swelling in legs   [] Varicose veins   [] Non-healing ulcers Pulmonary:   [] Uses home oxygen   [] Productive cough   [] Hemoptysis   [] Wheeze  [] COPD   [] Asthma Neurologic:  [] Dizziness   [] Seizures   [] History of stroke   [] History of TIA  [] Aphasia   [] Vissual changes   [] Weakness or numbness in arm   [] Weakness or numbness in leg Musculoskeletal:   [] Joint swelling   [] Joint pain   [] Low back pain Hematologic:  [] Easy bruising  [] Easy bleeding   [] Hypercoagulable state   [] Anemic Gastrointestinal:  [] Diarrhea   [] Vomiting  [x] Gastroesophageal reflux/heartburn   [] Difficulty swallowing. Genitourinary:  [] Chronic kidney disease   [] Difficult urination  [] Frequent urination   [] Blood in urine Skin:  [] Rashes   [] Ulcers   Psychological:  [] History of anxiety   []  History of major depression.  Physical Examination  Vitals:   02/10/22 1440  BP: (!) 165/84  Pulse: (!) 105  Resp: 16  Weight: 218 lb (98.9 kg)  Height: 6\' 2"  (1.88 m)   Body mass index is 27.99 kg/m. Gen: WD/WN, NAD Head: [place]/AT, No temporalis wasting.  Ear/Nose/Throat: Hearing grossly intact, nares w/o erythema or drainage, pinna without lesions Eyes: PER, EOMI, sclera nonicteric.  Neck: Supple, no gross masses.  No JVD.  Pulmonary:  Good air movement, no audible wheezing, no use of accessory muscles.  Cardiac: RRR, precordium not hyperdynamic. Vascular:  Large varicosities present extensively greater than 10 mm bilaterally.  Mild venous stasis changes to the legs bilaterally.  2+ soft pitting edema  Vessel Right  Left  Radial Palpable Palpable  Gastrointestinal: soft, non-distended. No guarding/no peritoneal signs.  Musculoskeletal: M/S 5/5 throughout.  No deformity.  Neurologic: CN 2-12 intact. Pain and light touch intact in extremities.  Symmetrical.  Speech is fluent. Motor exam as listed above. Psychiatric: Judgment intact, Mood & affect appropriate for pt's clinical situation. Dermatologic: Venous rashes no ulcers noted.  No changes consistent with cellulitis. Lymph : No lichenification or skin changes of chronic lymphedema.  CBC Lab Results  Component Value Date   WBC 7.7 11/18/2021   HGB 12.3 (L) 11/18/2021   HCT 46.9 02/03/2022   MCV 77.5 (L) 11/18/2021   PLT 294 11/18/2021    BMET    Component Value Date/Time   NA 133 (L) 11/18/2021 0319   NA 139 04/01/2021 1636   K 4.0 11/18/2021 0319   CL 101 11/18/2021 0319   CO2 26 11/18/2021 0319   GLUCOSE 175 (H) 11/18/2021 0319   BUN 17 11/18/2021 0319   BUN 23 04/01/2021 1636   BUN 25 02/09/2019 0000   CREATININE 0.86 11/18/2021 0319   CALCIUM 8.4 (L) 11/18/2021 0319   GFRNONAA >60 11/18/2021 0319   GFRAA 88 09/28/2020 0942   CrCl cannot be calculated (Patient's  most recent lab result is older than the maximum 21 days allowed.).  COAG No results found for: INR, PROTIME  Radiology DG Ankle Complete Right  Result Date: 01/24/2022 Please see detailed radiograph report in office note.    Assessment/Plan 1. Varicose veins of both lower extremities with inflammation Recommend:  The patient has competent saphenous venous system bilaterally but still has persistent symptoms of pain and swelling that are having a negative impact on daily life and daily activities.  Patient should undergo injection sclerotherapy to treat the residual varicosities.  The risks, benefits and alternative therapies were reviewed in detail with the patient.  All questions were answered.  The patient agrees to proceed with sclerotherapy at their convenience.  The patient will continue wearing the graduated compression stockings and using the over-the-counter pain medications to treat her symptoms.   2. Chronic venous insufficiency Recommend:  The patient has competent saphenous venous system bilaterally but still has persistent symptoms of pain and swelling that are having a negative impact on daily life and daily activities.  Patient should undergo injection sclerotherapy to treat the residual varicosities.  The risks, benefits and alternative therapies were reviewed in detail with the patient.  All questions were answered.  The patient agrees to proceed with sclerotherapy at their convenience.  The patient will continue wearing the graduated compression stockings and using the over-the-counter pain medications to treat her symptoms.   3. Primary hypertension Continue antihypertensive medications as already ordered, these medications have been reviewed and there are no changes at this time.   4. Gastroesophageal reflux disease without esophagitis Continue PPI as already ordered, this medication has been reviewed and there are no changes at this time.  Avoidence of  caffeine and alcohol  Moderate elevation of the head of the bed    5. Type 2 diabetes mellitus with other specified complication, without long-term current use of insulin (HCC) Continue hypoglycemic medications as already ordered, these medications have been reviewed and there are no changes at this time.  Hgb A1C to be monitored as already arranged by primary service     Levora Dredge, MD  02/16/2022 1:38 PM

## 2022-02-21 NOTE — Progress Notes (Signed)
?  ? ? ?Established patient visit ? ? ?Patient: Jonathan Fields   DOB: 05/08/1956   65 y.o. Male  MRN: [id number] ?Visit Date: 02/24/2022 ? ?Today's healthcare provider: Angela [name], MD  ? ?Chief Complaint  ?Patient presents with  ? Diabetes  ? Hypertension  ? Hyperlipidemia  ? ?Subjective  ?  ?HPI  ?Diabetes Mellitus Type II, follow-up ? ?Lab Results  ?Component Value Date  ? HGBA1C 8.3 (H) 01/24/2022  ? HGBA1C 9.5 (H) 11/04/2021  ? HGBA1C 7.8 (H) 04/01/2021  ? ?Last seen for diabetes 3 months ago.  ?Management since then includes continuing the same treatment. ?He reports excellent compliance with treatment. ?He is not having side effects.  ? ?Home blood sugar records:  not checked ? ?Episodes of hypoglycemia? No  ?  ?Current insulin regiment: none ?Most Recent Eye Exam: within the past 12 months ? ?--------------------------------------------------------------------------------------------------- ?Hypertension, follow-up ? ?BP Readings from Last 3 Encounters:  ?02/24/22 137/85  ?02/10/22 (!) 165/84  ?02/07/22 130/72  ? Wt Readings from Last 3 Encounters:  ?02/24/22 225 lb 4.8 oz (102.2 kg)  ?02/10/22 218 lb (98.9 kg)  ?02/07/22 215 lb (97.5 kg)  ?  ? ?He was last seen for hypertension 3 months ago.  ?BP at that visit was 158/99. ?Management since that visit includes "Patient was previously on HCTZ 12.5 mg daily in addition to his lisinopril, but it seems it was self discontinued ?He does not remember any side effects from this medication, so we will resume in addition to his current lisinopril 40 mg daily." ?He reports excellent compliance with treatment. ?He is not having side effects.  ?He is exercising. ?He is adherent to low salt diet.   ?Outside blood pressures are not checked. ? ?He does not smoke. ? ?--------------------------------------------------------------------------------------------------- ?Lipid/Cholesterol, follow-up ? ?Last Lipid Panel: ?Lab Results  ?Component Value Date  ? CHOL 222 (H)  11/04/2021  ? LDLCALC 154 (H) 11/04/2021  ? HDL 36 (L) 11/04/2021  ? TRIG 177 (H) 11/04/2021  ? ? ?He was last seen for this 3 months ago.  ?Management since that visit includes Will likely need to resume statin. ? ?He reports excellent compliance with treatment. ?He is not having side effects.  ? ?Symptoms: ?No appetite changes No foot ulcerations  ?No chest pain No chest pressure/discomfort  ?No dyspnea No orthopnea  ?Yes fatigue No lower extremity edema  ?No palpitations No paroxysmal nocturnal dyspnea  ?No nausea No numbness or tingling of extremity  ?Yes polydipsia Yes polyuria  ?No speech difficulty No syncope  ? ?He is following a Regular diet. ?Current exercise: no regular exercise ? ?Last metabolic panel ?Lab Results  ?Component Value Date  ? GLUCOSE 175 (H) 11/18/2021  ? NA 133 (L) 11/18/2021  ? K 4.0 11/18/2021  ? BUN 17 11/18/2021  ? CREATININE 0.86 11/18/2021  ? EGFR 72 04/01/2021  ? GFRNONAA >60 11/18/2021  ? CALCIUM 8.4 (L) 11/18/2021  ? AST 16 11/10/2021  ? ALT 17 11/10/2021  ? ?The 10-year ASCVD risk score (Arnett DK, et al., 2019) is: 40.7% ? ?---------------------------------------------------------------------------------------------------  ? ?Medications: ?Outpatient Medications Prior to Visit  ?Medication Sig  ? alfuzosin (UROXATRAL) 10 MG 24 hr tablet Take 1 tablet (10 mg total) by mouth daily with breakfast.  ? diphenhydramine-acetaminophen (TYLENOL PM) 25-500 MG TABS tablet Take 1 tablet by mouth at bedtime as needed.  ? fluticasone (FLONASE) 50 MCG/ACT nasal spray 1-2 sprays daily as needed.  ? gabapentin (NEURONTIN) 300 MG capsule Take 300 mg by mouth   daily.  ? hydrochlorothiazide (HYDRODIURIL) 12.5 MG tablet Take 1 tablet (12.5 mg total) by mouth daily.  ? lisinopril (ZESTRIL) 40 MG tablet TAKE 1 TABLET BY MOUTH DAILY  ? meloxicam (MOBIC) 15 MG tablet Take 15 mg by mouth daily.  ? metFORMIN (GLUCOPHAGE XR) 500 MG 24 hr tablet Take 2 tablets (1,000 mg total) by mouth in the morning and at  bedtime.  ? MYRBETRIQ 50 MG TB24 tablet Take 50 mg by mouth daily.  ? NEEDLE, DISP, 21 G (BD SAFETYGLIDE SHIELDED NEEDLE) 21G X 1-1/2" MISC Use 21guage needle to inject Testosterone Cypionate 81m.  ? omeprazole (PRILOSEC) 20 MG capsule Take 20 mg by mouth daily.  ? ondansetron (ZOFRAN) 4 MG tablet Take 1 tablet (4 mg total) by mouth every 8 (eight) hours as needed for nausea or vomiting.  ? Protein POWD Take 1 Scoop by mouth daily.  ? rifampin (RIFADIN) 300 MG capsule Take 1 capsule (300 mg total) by mouth 2 (two) times daily.  ? testosterone cypionate (DEPOTESTOSTERONE CYPIONATE) 200 MG/ML injection INJECT 0.5 ML IN THE MUSCLE EVERY WEEK  ? [DISCONTINUED] dapagliflozin propanediol (FARXIGA) 5 MG TABS tablet Take 5 mg by mouth daily.  ? lactulose (CHRONULAC) 10 GM/15ML solution Take 30 mLs (20 g total) by mouth 2 (two) times daily as needed for mild constipation or moderate constipation. (Patient not taking: Reported on 02/24/2022)  ? ?No facility-administered medications prior to visit.  ? ? ?Review of Systems per HPI ? ? ?  Objective  ?  ?BP 137/85 Comment: home reading  Pulse (!) 101   Temp 98.4 ?F (36.9 ?C)   Resp 15   Wt 225 lb 4.8 oz (102.2 kg)   SpO2 95%   BMI 28.93 kg/m?  ? ? ?Physical Exam ?Vitals reviewed.  ?Constitutional:   ?   General: He is not in acute distress. ?   Appearance: Normal appearance. He is not diaphoretic.  ?HENT:  ?   Head: Normocephalic and atraumatic.  ?Eyes:  ?   General: No scleral icterus. ?   Conjunctiva/sclera: Conjunctivae normal.  ?Cardiovascular:  ?   Rate and Rhythm: Normal rate and regular rhythm.  ?   Pulses: Normal pulses.  ?   Heart sounds: Normal heart sounds. No murmur heard. ?Pulmonary:  ?   Effort: Pulmonary effort is normal. No respiratory distress.  ?   Breath sounds: Normal breath sounds. No wheezing or rhonchi.  ?Musculoskeletal:  ?   Cervical back: Neck supple.  ?   Right lower leg: No edema.  ?   Left lower leg: No edema.  ?Lymphadenopathy:  ?   Cervical: No  cervical adenopathy.  ?Skin: ?   General: Skin is warm and dry.  ?   Capillary Refill: Capillary refill takes less than 2 seconds.  ?Neurological:  ?   Mental Status: He is alert and oriented to person, place, and time.  ?Psychiatric:     ?   Mood and Affect: Mood normal.     ?   Behavior: Behavior normal.  ?  ? ? ?No results found for any visits on 02/24/22. ? Assessment & Plan  ?  ? ?Problem List Items Addressed This Visit   ? ?  ? Cardiovascular and Mediastinum  ? Hypertension associated with diabetes ([place]  ?  Well controlled ?Continue current medications ?Recheck metabolic panel at next visit - reviewed recent ?  ?  ? Relevant Medications  ? dapagliflozin propanediol (FARXIGA) 10 MG TABS tablet  ? atorvastatin (LIPITOR) 40  MG tablet  ?  ? Endocrine  ? T2DM (type 2 diabetes mellitus) (HCC) - Primary  ?  Uncontrolled with hyperglycemia ?Assoc with HTN and HLD ?Continue metformin at current dose ?Increase farxiga to 10 mg daily ?UTD on vaccinations nad foot exam ?Resume statin ?F/u in 3m and recheck a1c ?  ?  ? Relevant Medications  ? dapagliflozin propanediol (FARXIGA) 10 MG TABS tablet  ? atorvastatin (LIPITOR) 40 MG tablet  ? Other Relevant Orders  ? Urine Microalbumin w/creat. ratio  ? Hyperlipidemia associated with type 2 diabetes mellitus (HCC)  ?  Previously uncontrolled ?resume statin - atorvastatin 40 mg daily ?Repeat FLP and CMP at next visit ?Goal LDL < 70  ?  ?  ? Relevant Medications  ? dapagliflozin propanediol (FARXIGA) 10 MG TABS tablet  ? atorvastatin (LIPITOR) 40 MG tablet  ?  ? ?Return in about 3 months (around 05/27/2022) for chronic disease f/u.  ?   ? ? ?I,Kathleen J Wolford,acting as a scribe for Angela [name], MD.,have documented all relevant documentation on the behalf of Angela [name], MD,as directed by  Angela [name], MD while in the presence of Angela [name], MD.  ? ?I, Angela [name], MD, have reviewed all documentation for this visit. The documentation on 02/24/22  for the exam, diagnosis, procedures, and orders are all accurate and complete. ? ? ?[name], Angela M, MD, MPH ?[facility] ?[facility]   ?

## 2022-02-24 ENCOUNTER — Ambulatory Visit: Payer: BC Managed Care – PPO | Admitting: Family Medicine

## 2022-02-24 ENCOUNTER — Encounter: Payer: Self-pay | Admitting: Family Medicine

## 2022-02-24 ENCOUNTER — Other Ambulatory Visit: Payer: Self-pay

## 2022-02-24 ENCOUNTER — Encounter: Payer: Self-pay | Admitting: Oncology

## 2022-02-24 VITALS — BP 137/85 | HR 101 | Temp 98.4°F | Resp 15 | Wt 225.3 lb

## 2022-02-24 DIAGNOSIS — E1159 Type 2 diabetes mellitus with other circulatory complications: Secondary | ICD-10-CM | POA: Diagnosis not present

## 2022-02-24 DIAGNOSIS — I152 Hypertension secondary to endocrine disorders: Secondary | ICD-10-CM | POA: Diagnosis not present

## 2022-02-24 DIAGNOSIS — E785 Hyperlipidemia, unspecified: Secondary | ICD-10-CM

## 2022-02-24 DIAGNOSIS — E1169 Type 2 diabetes mellitus with other specified complication: Secondary | ICD-10-CM

## 2022-02-24 MED ORDER — ATORVASTATIN CALCIUM 40 MG PO TABS: 40.0000 mg | ORAL_TABLET | Freq: Every day | ORAL | 3 refills | Status: DC

## 2022-02-24 MED ORDER — DAPAGLIFLOZIN PROPANEDIOL 10 MG PO TABS: 10.0000 mg | ORAL_TABLET | Freq: Every day | ORAL | 1 refills | Status: DC

## 2022-02-24 NOTE — Assessment & Plan Note (Signed)
Uncontrolled with hyperglycemia ?Assoc with HTN and HLD ?Continue metformin at current dose ?Increase farxiga to 10 mg daily ?UTD on vaccinations nad foot exam ?Resume statin ?F/u in 102mand recheck a1c ?

## 2022-02-24 NOTE — Assessment & Plan Note (Signed)
Previously uncontrolled ?resume statin - atorvastatin 40 mg daily ?Repeat FLP and CMP at next visit ?Goal LDL < 70  ?

## 2022-02-24 NOTE — Assessment & Plan Note (Signed)
Well controlled ?Continue current medications ?Recheck metabolic panel at next visit - reviewed recent ?

## 2022-02-25 ENCOUNTER — Encounter: Payer: Self-pay | Admitting: Podiatry

## 2022-02-25 ENCOUNTER — Ambulatory Visit (INDEPENDENT_AMBULATORY_CARE_PROVIDER_SITE_OTHER): Payer: BC Managed Care – PPO | Admitting: Podiatry

## 2022-02-25 ENCOUNTER — Ambulatory Visit (INDEPENDENT_AMBULATORY_CARE_PROVIDER_SITE_OTHER): Payer: BC Managed Care – PPO

## 2022-02-25 DIAGNOSIS — E1169 Type 2 diabetes mellitus with other specified complication: Secondary | ICD-10-CM | POA: Diagnosis not present

## 2022-02-25 DIAGNOSIS — Z9889 Other specified postprocedural states: Secondary | ICD-10-CM

## 2022-02-25 NOTE — Progress Notes (Signed)
? ?Subjective:  ?Patient presents today status post right ankle incision and drainage. DOS: 11/13/2021 secondary to septic joint from complication of lateral ankle stabilization procedure 09/05/2021.  Patient has been doing well.  He wears hightop shoes and an ankle brace as needed.  He has been doing full activity over the last few weeks with no complaints.  Unfortunately he does suffer from chronic ankle instability that he had suffered from even prior to surgery. ? ?Past Medical History:  ?Diagnosis Date  ? BPH (benign prostatic hyperplasia)   ? Bronchitis   ? Complication of anesthesia   ? hard to wake up  ? DM (diabetes mellitus), type 2 ([place])   ? DVT (deep venous thrombosis) ([place])   ? post op  ? ED (erectile dysfunction)   ? Erythrocytosis 07/04/2020  ? GERD (gastroesophageal reflux disease)   ? Hyperlipemia   ? Hypertension   ? Hypogonadism in male   ? Nocturia   ? Right ankle instability   ? Sleep apnea   ? TB lung, latent   ? Urinary frequency   ? ?  ?Past Surgical History:  ?Procedure Laterality Date  ? ANKLE ARTHROSCOPY Right 11/13/2021  ? Procedure: ANKLE ARTHROSCOPY Arthroscopic Irrigation;  Surgeon: [name], DPM;  Location: [facility] ORS;  Service: Podiatry;  Laterality: Right;  ? ANKLE SURGERY Left   ? torn ligament  ? CARPAL TUNNEL RELEASE Right 08/2019  ? COLONOSCOPY  2009  ? COLONOSCOPY WITH PROPOFOL N/A 01/23/2022  ? Procedure: COLONOSCOPY WITH PROPOFOL;  Surgeon: [name], MD;  Location: [facility] ENDOSCOPY;  Service: Endoscopy;  Laterality: N/A;  ? IRRIGATION AND DEBRIDEMENT ABSCESS Right 11/13/2021  ? Procedure: IRRIGATION AND DEBRIDEMENT ABSCESS;  Surgeon: [name], DPM;  Location: [facility] ORS;  Service: Podiatry;  Laterality: Right;  ? IRRIGATION AND DEBRIDEMENT FOOT Right 11/16/2021  ? Procedure: IRRIGATION AND DEBRIDEMENT ANKLE;  Surgeon: Edrick Kins, DPM;  Location: [facility] ORS;  Service: Podiatry;  Laterality: Right;  ? KNEE ARTHROSCOPY Left   ? right ankle ruptured tendon surgery   2008  ? TRANSURETHRAL RESECTION OF PROSTATE N/A 02/19/2018  ? Procedure: TRANSURETHRAL RESECTION OF THE PROSTATE (TURP);  Surgeon: [name], MD;  Location: [facility] ORS;  Service: Urology;  Laterality: N/A;  ? ?Allergies  ?Allergen Reactions  ? Penicillins Hives  ?  Has patient had a PCN reaction causing immediate rash, facial/tongue/throat swelling, SOB or lightheadedness with hypotension: yes ?Has patient had a PCN reaction causing severe rash involving mucus membranes or skin necrosis: no ?Has patient had a PCN reaction that required hospitalization: no ?Has patient had a PCN reaction occurring within the last 10 years: no ?If all of the above answers are "NO", then may proceed with Cephalosporin use. ?  ? ?Objective/Physical Exam ?Neurovascular status intact.  Muscle strength 5/5 all compartments.  No edema.  No erythema.  No concern clinically for infection.  Chronic ankle instability remains especially with weightbearing. Cavovarus deformity right lower extremity ? ? ?Assessment: ?1. s/p incision and drainage RT ankle. DOS: 11/13/2021 ?2.  Ulcer right lateral ankle; resolved ? ? ?Plan of Care:  ?1. Patient was evaluated.   ?2.  Overall the patient is doing well.  He has no symptoms or edema or swelling to the foot.  He is back to regular activities.  Unfortunately he does suffer from chronic ankle instability that he did prior to surgery.  He understands that the entire intent of the surgery was to help stabilize the ankle. ?3.  Recommend  hightop shoes that are lace up in a good ankle brace as needed ?4.  He may now resume full activity no restrictions ?5.  Return to clinic as needed ? ?*Spouse's name is Marissa ? ?Edrick Kins, DPM ?[place] ? ?Dr. Edrick Kins, DPM  ?  ?[address]                                    ?[place], [place] [address]                ?Office [phone]  ?Fax [phone] ? ? ? ? ? ?

## 2022-02-26 LAB — MICROALBUMIN / CREATININE URINE RATIO
Creatinine, Urine: 71.1 mg/dL
Microalb/Creat Ratio: 22 mg/g creat (ref 0–29)
Microalbumin, Urine: 15.5 ug/mL

## 2022-03-08 ENCOUNTER — Other Ambulatory Visit: Payer: Self-pay | Admitting: Family Medicine

## 2022-03-17 ENCOUNTER — Encounter (INDEPENDENT_AMBULATORY_CARE_PROVIDER_SITE_OTHER): Payer: Self-pay | Admitting: Vascular Surgery

## 2022-03-17 ENCOUNTER — Ambulatory Visit (INDEPENDENT_AMBULATORY_CARE_PROVIDER_SITE_OTHER): Payer: BC Managed Care – PPO | Admitting: Vascular Surgery

## 2022-03-17 VITALS — BP 135/83 | HR 99 | Resp 17 | Ht 74.0 in | Wt 224.0 lb

## 2022-03-17 DIAGNOSIS — I8311 Varicose veins of right lower extremity with inflammation: Secondary | ICD-10-CM | POA: Diagnosis not present

## 2022-03-17 DIAGNOSIS — I8312 Varicose veins of left lower extremity with inflammation: Secondary | ICD-10-CM

## 2022-03-17 NOTE — Progress Notes (Signed)
? ?  Indication:  Patient presents with symptomatic varicose veins of the right lower extremity. ? ?Procedure:  Sclerotherapy using SDS foam mixed with 1% Lidocaine was performed on the right lower extremity.  Compression wraps were placed.  The patient tolerated the procedure well. ? ?Plan:  Follow up as needed. ?  ?

## 2022-03-20 ENCOUNTER — Other Ambulatory Visit: Payer: Self-pay | Admitting: Family Medicine

## 2022-03-20 MED ORDER — MYRBETRIQ 50 MG PO TB24: 50.0000 mg | ORAL_TABLET | Freq: Every day | ORAL | 11 refills | Status: DC

## 2022-03-21 ENCOUNTER — Other Ambulatory Visit: Payer: BC Managed Care – PPO

## 2022-03-21 DIAGNOSIS — N401 Enlarged prostate with lower urinary tract symptoms: Secondary | ICD-10-CM

## 2022-03-21 DIAGNOSIS — E291 Testicular hypofunction: Secondary | ICD-10-CM

## 2022-03-22 LAB — HEMATOCRIT: Hematocrit: 47.1 % (ref 37.5–51.0)

## 2022-03-22 LAB — PSA: Prostate Specific Ag, Serum: 1.7 ng/mL (ref 0.0–4.0)

## 2022-03-22 LAB — TESTOSTERONE: Testosterone: 1065 ng/dL — ABNORMAL HIGH (ref 264–916)

## 2022-03-24 ENCOUNTER — Encounter: Payer: Self-pay | Admitting: Oncology

## 2022-03-24 ENCOUNTER — Inpatient Hospital Stay (HOSPITAL_BASED_OUTPATIENT_CLINIC_OR_DEPARTMENT_OTHER): Payer: BC Managed Care – PPO | Admitting: Oncology

## 2022-03-24 ENCOUNTER — Inpatient Hospital Stay: Payer: BC Managed Care – PPO

## 2022-03-24 ENCOUNTER — Inpatient Hospital Stay: Payer: BC Managed Care – PPO | Attending: Oncology

## 2022-03-24 VITALS — BP 156/73 | HR 108 | Temp 96.9°F | Wt 226.0 lb

## 2022-03-24 DIAGNOSIS — D751 Secondary polycythemia: Secondary | ICD-10-CM | POA: Diagnosis not present

## 2022-03-24 DIAGNOSIS — Z79890 Hormone replacement therapy (postmenopausal): Secondary | ICD-10-CM

## 2022-03-24 LAB — CBC WITH DIFFERENTIAL/PLATELET
Abs Immature Granulocytes: 0.04 10*3/uL (ref 0.00–0.07)
Basophils Absolute: 0 10*3/uL (ref 0.0–0.1)
Basophils Relative: 1 %
Eosinophils Absolute: 0.1 10*3/uL (ref 0.0–0.5)
Eosinophils Relative: 1 %
HCT: 47.4 % (ref 39.0–52.0)
Hemoglobin: 15.5 g/dL (ref 13.0–17.0)
Immature Granulocytes: 1 %
Lymphocytes Relative: 23 %
Lymphs Abs: 1.3 10*3/uL (ref 0.7–4.0)
MCH: 26.8 pg (ref 26.0–34.0)
MCHC: 32.7 g/dL (ref 30.0–36.0)
MCV: 82 fL (ref 80.0–100.0)
Monocytes Absolute: 0.7 10*3/uL (ref 0.1–1.0)
Monocytes Relative: 12 %
Neutro Abs: 3.5 10*3/uL (ref 1.7–7.7)
Neutrophils Relative %: 62 %
Platelets: 232 10*3/uL (ref 150–400)
RBC: 5.78 MIL/uL (ref 4.22–5.81)
RDW: 13.2 % (ref 11.5–15.5)
WBC: 5.7 10*3/uL (ref 4.0–10.5)
nRBC: 0 % (ref 0.0–0.2)

## 2022-03-24 NOTE — Progress Notes (Signed)
?Hematology/Oncology Progress note ?[phone] [phone] ?  ? ? ? ?Patient Care Team: ?Virginia Crews, MD as PCP - General (Family Medicine) ? ?REFERRING PROVIDER: ?Virginia Crews, MD  ?CHIEF COMPLAINTS/REASON FOR VISIT:  ?follo wup  of polycytosis/erythrocytosis ? ?HISTORY OF PRESENTING ILLNESS:  ?Jonathan Fields is a 66 y.o. male who was seen in consultation at the request of [name], Dionne Bucy, MD for evaluation of polycytosis/erythrocytosis ?Reviewed patient's recent lab work ?03/14/2020, hematocrit 55, ?09/13/2019 hematocrit 51.8, ?07/22/2019, hematocrit 49.1.  Hemoglobin 17.2.   ?Patient is on testosterone replacement.  Currently Dr. Bernardo Heater hold off treatments due to erythrocytosis. ?Patient was is Dr. Dennis Bast to hematology oncology for evaluation and management. ?Associated signs or symptoms: Patient mention about 15 pounds of unintentional weight loss recently during the past few weeks.  His appetite is unchanged.  He feels that he has eaten similar amount of food recently.  Denies any increase of activity level.  Some sweating for the past 2 days. ? ?Context:  ?Smoking history: Denies. ?Testosterone supplements: ?History of blood clots: Previously he has an episode of provoked thrombosis after surgery. ?Daytime somnolence: Denies ?Family history of polycythemia: Denies ? ?INTERVAL HISTORY ?Jonathan Fields is a 66 y.o. 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  Lost follow-up due to surgery. ?Patient underwent stabilization of right foot complicated by wound dehiscence, septic arthritis of the right ankle, acute osteomyelitis.  Patient was taken to the OR for washout and debridement in December 2023.  Finished antibiotic course.  Patient was off testosterone his hemoglobin has been stable.  He recently resumed the previous dose of  testosterone from 4 weeks ago.  Today he feels well.  No new complaints.  Denies any arthralgia, fever, chills. ? ?Patient follows up with vascular surgery and was recently seen on 03/17/2022 for Sclerotherapy for symptomatic varicose veins of the right lower extremity. ? ?Review of Systems  ?Constitutional:  Negative for appetite change, chills, fatigue, fever and unexpected weight change.  ?HENT:   Negative for hearing loss and voice change.   ?Eyes:  Negative for eye problems and icterus.  ?Respiratory:  Negative for chest tightness, cough and shortness of breath.   ?Cardiovascular:  Negative for chest pain and leg swelling.  ?Gastrointestinal:  Negative for abdominal distention and abdominal pain.  ?Endocrine: Negative for hot flashes.  ?Genitourinary:  Negative for difficulty urinating, dysuria and frequency.   ?Musculoskeletal:  Negative for arthralgias.  ?Skin:  Negative for itching and rash.  ?Neurological:  Negative for light-headedness and numbness.  ?Hematological:  Negative for adenopathy. Does not bruise/bleed easily.  ?Psychiatric/Behavioral:  Negative for confusion.   ? ?MEDICAL HISTORY:  ?Past Medical History:  ?Diagnosis Date  ? BPH (benign prostatic hyperplasia)   ? Bronchitis   ? Complication of anesthesia   ? hard to wake up  ? DM (diabetes mellitus), type 2 ([place])   ? DVT (deep venous thrombosis) ([place])   ? post op  ? ED (erectile dysfunction)   ? Erythrocytosis 07/04/2020  ? GERD (gastroesophageal reflux disease)   ? Hyperlipemia   ? Hypertension   ? Hypogonadism in male   ? Nocturia   ? Right ankle instability   ? Sleep apnea   ? TB lung, latent   ? Urinary frequency   ? ? ?SURGICAL HISTORY: ?Past Surgical History:  ?Procedure Laterality Date  ?  ANKLE ARTHROSCOPY Right 11/13/2021  ? Procedure: ANKLE ARTHROSCOPY Arthroscopic Irrigation;  Surgeon: [name], DPM;  Location: [facility] ORS;  Service: Podiatry;  Laterality: Right;  ? ANKLE SURGERY Left   ? torn ligament  ? CARPAL TUNNEL RELEASE Right  08/2019  ? COLONOSCOPY  2009  ? COLONOSCOPY WITH PROPOFOL N/A 01/23/2022  ? Procedure: COLONOSCOPY WITH PROPOFOL;  Surgeon: [name], MD;  Location: [facility] ENDOSCOPY;  Service: Endoscopy;  Laterality: N/A;  ? IRRIGATION AND DEBRIDEMENT ABSCESS Right 11/13/2021  ? Procedure: IRRIGATION AND DEBRIDEMENT ABSCESS;  Surgeon: [name], DPM;  Location: [facility] ORS;  Service: Podiatry;  Laterality: Right;  ? IRRIGATION AND DEBRIDEMENT FOOT Right 11/16/2021  ? Procedure: IRRIGATION AND DEBRIDEMENT ANKLE;  Surgeon: Edrick Kins, DPM;  Location: [facility] ORS;  Service: Podiatry;  Laterality: Right;  ? KNEE ARTHROSCOPY Left   ? right ankle ruptured tendon surgery  2008  ? TRANSURETHRAL RESECTION OF PROSTATE N/A 02/19/2018  ? Procedure: TRANSURETHRAL RESECTION OF THE PROSTATE (TURP);  Surgeon: [name], MD;  Location: [facility] ORS;  Service: Urology;  Laterality: N/A;  ? ? ?SOCIAL HISTORY: ?Social History  ? ?Socioeconomic History  ? Marital status: Married  ?  Spouse name: Not on file  ? Number of children: 0  ? Years of education: Not on file  ? Highest education level: Not on file  ?Occupational History  ? Not on file  ?Tobacco Use  ? Smoking status: Never  ? Smokeless tobacco: Current  ?  Types: Snuff  ? Tobacco comments:  ?  tobaco pouches that produce juice  ?Vaping Use  ? Vaping Use: Never used  ?Substance and Sexual Activity  ? Alcohol use: Yes  ?  Comment: COUPLE OF DRINKS EVERY WEEK  ? Drug use: No  ? Sexual activity: Yes  ?  Partners: Female  ?  Birth control/protection: None  ?Other Topics Concern  ? Not on file  ?Social History Narrative  ? Not on file  ? ?Social Determinants of Health  ? ?Financial Resource Strain: Not on file  ?Food Insecurity: Not on file  ?Transportation Needs: Not on file  ?Physical Activity: Not on file  ?Stress: Not on file  ?Social Connections: Not on file  ?Intimate Partner Violence: Not on file  ? ? ?FAMILY HISTORY: ?Family History  ?Problem Relation Age of Onset  ? Heart disease  Father   ? Hypertension Father   ? Prostate cancer Father   ? Heart disease Mother   ? Breast cancer Mother   ? Colon cancer Neg Hx   ? ? ?ALLERGIES:  is allergic to penicillins. ? ?MEDICATIONS:  ?Current Outpatient Medications  ?Medication Sig Dispense Refill  ? alfuzosin (UROXATRAL) 10 MG 24 hr tablet Take 1 tablet (10 mg total) by mouth daily with breakfast. 30 tablet 11  ? atorvastatin (LIPITOR) 40 MG tablet Take 1 tablet (40 mg total) by mouth daily. 90 tablet 3  ? dapagliflozin propanediol (FARXIGA) 10 MG TABS tablet Take 1 tablet (10 mg total) by mouth daily. 90 tablet 1  ? diphenhydramine-acetaminophen (TYLENOL PM) 25-500 MG TABS tablet Take 1 tablet by mouth at bedtime as needed.    ? fluticasone (FLONASE) 50 MCG/ACT nasal spray 1-2 sprays daily as needed.  0  ? gabapentin (NEURONTIN) 300 MG capsule TAKE 1 CAPSULE(300 MG) BY MOUTH AT BEDTIME 30 capsule 1  ? hydrochlorothiazide (HYDRODIURIL) 12.5 MG tablet Take 1 tablet (12.5 mg total) by mouth daily. 90 tablet 3  ? lactulose (CHRONULAC) 10 GM/15ML  solution Take 30 mLs (20 g total) by mouth 2 (two) times daily as needed for mild constipation or moderate constipation. 236 mL 0  ? lisinopril (ZESTRIL) 40 MG tablet TAKE 1 TABLET BY MOUTH DAILY 90 tablet 0  ? meloxicam (MOBIC) 15 MG tablet Take 15 mg by mouth daily.    ? metFORMIN (GLUCOPHAGE XR) 500 MG 24 hr tablet Take 2 tablets (1,000 mg total) by mouth in the morning and at bedtime. 360 tablet 1  ? MYRBETRIQ 50 MG TB24 tablet Take 1 tablet (50 mg total) by mouth daily. 30 tablet 11  ? NEEDLE, DISP, 21 G (BD SAFETYGLIDE SHIELDED NEEDLE) 21G X 1-1/2" MISC Use 21guage needle to inject Testosterone Cypionate 14m. 25 each 0  ? omeprazole (PRILOSEC) 20 MG capsule Take 20 mg by mouth daily.    ? ondansetron (ZOFRAN) 4 MG tablet Take 1 tablet (4 mg total) by mouth every 8 (eight) hours as needed for nausea or vomiting. 20 tablet 0  ? Protein POWD Take 1 Scoop by mouth daily.    ? rifampin (RIFADIN) 300 MG capsule  Take 1 capsule (300 mg total) by mouth 2 (two) times daily. 28 capsule 0  ? testosterone cypionate (DEPOTESTOSTERONE CYPIONATE) 200 MG/ML injection INJECT 0.5 ML IN THE MUSCLE EVERY WEEK 10 mL 0  ? ?No cu

## 2022-03-24 NOTE — Progress Notes (Signed)
Per Dr. Tasia Catchings, phlebotomy not indicated today.  ?

## 2022-03-25 ENCOUNTER — Encounter: Payer: Self-pay | Admitting: *Deleted

## 2022-04-14 ENCOUNTER — Telehealth: Payer: Self-pay

## 2022-04-14 NOTE — Telephone Encounter (Signed)
Copied from [place] [id number]. Topic: General - Other ?>> Apr 14, 2022  3:36 PM Pawlus, Brayton Layman A wrote: ?Reason for CRM: Pt called in needing a copy of his latest A1C results, please advise. ?

## 2022-04-15 NOTE — Telephone Encounter (Signed)
Patient reports he was able to get the A1c number from a staff member yesterday.  ?

## 2022-04-17 DIAGNOSIS — M19012 Primary osteoarthritis, left shoulder: Secondary | ICD-10-CM | POA: Diagnosis not present

## 2022-04-19 ENCOUNTER — Other Ambulatory Visit: Payer: Self-pay | Admitting: Family Medicine

## 2022-04-21 NOTE — Telephone Encounter (Signed)
Dr. B this is saying the following The original prescription was discontinued on 11/13/2021 by Dallie Piles, [facility] for the following reason: Change in therapy. Renewing this prescription may not be appropriate.   In your last office visit you advised patient to Increase farxiga to 10 mg daily.

## 2022-05-02 ENCOUNTER — Other Ambulatory Visit: Payer: Self-pay | Admitting: Family Medicine

## 2022-05-05 ENCOUNTER — Encounter: Payer: Self-pay | Admitting: Oncology

## 2022-05-05 ENCOUNTER — Ambulatory Visit (INDEPENDENT_AMBULATORY_CARE_PROVIDER_SITE_OTHER): Payer: BC Managed Care – PPO | Admitting: Vascular Surgery

## 2022-05-05 ENCOUNTER — Encounter (INDEPENDENT_AMBULATORY_CARE_PROVIDER_SITE_OTHER): Payer: Self-pay | Admitting: Vascular Surgery

## 2022-05-05 VITALS — BP 148/87 | HR 91 | Resp 16 | Wt 222.0 lb

## 2022-05-05 DIAGNOSIS — I8312 Varicose veins of left lower extremity with inflammation: Secondary | ICD-10-CM

## 2022-05-05 DIAGNOSIS — I8311 Varicose veins of right lower extremity with inflammation: Secondary | ICD-10-CM

## 2022-05-05 NOTE — Progress Notes (Signed)
Indication:  Patient presents with symptomatic varicose veins of the right lower extremity.  Procedure:  Sclerotherapy using hypertonic saline mixed with 1% Lidocaine was performed on the right lower extremity.  Compression wraps were placed.  The patient tolerated the procedure well.  Plan:  Follow up as needed.   

## 2022-05-11 ENCOUNTER — Encounter (INDEPENDENT_AMBULATORY_CARE_PROVIDER_SITE_OTHER): Payer: Self-pay | Admitting: Vascular Surgery

## 2022-05-29 ENCOUNTER — Ambulatory Visit: Payer: BC Managed Care – PPO | Admitting: Family Medicine

## 2022-06-02 ENCOUNTER — Encounter: Payer: Self-pay | Admitting: Family Medicine

## 2022-06-02 ENCOUNTER — Ambulatory Visit: Payer: BC Managed Care – PPO | Admitting: Family Medicine

## 2022-06-02 ENCOUNTER — Other Ambulatory Visit: Payer: Self-pay | Admitting: Family Medicine

## 2022-06-02 VITALS — BP 138/85 | HR 90 | Temp 98.6°F | Resp 17 | Ht 74.0 in | Wt 227.4 lb

## 2022-06-02 DIAGNOSIS — G4730 Sleep apnea, unspecified: Secondary | ICD-10-CM

## 2022-06-02 DIAGNOSIS — E1169 Type 2 diabetes mellitus with other specified complication: Secondary | ICD-10-CM

## 2022-06-02 DIAGNOSIS — E1159 Type 2 diabetes mellitus with other circulatory complications: Secondary | ICD-10-CM

## 2022-06-02 DIAGNOSIS — I152 Hypertension secondary to endocrine disorders: Secondary | ICD-10-CM

## 2022-06-02 DIAGNOSIS — K219 Gastro-esophageal reflux disease without esophagitis: Secondary | ICD-10-CM

## 2022-06-02 DIAGNOSIS — E1165 Type 2 diabetes mellitus with hyperglycemia: Secondary | ICD-10-CM

## 2022-06-02 LAB — POCT GLYCOSYLATED HEMOGLOBIN (HGB A1C)
Est. average glucose Bld gHb Est-mCnc: 171
Hemoglobin A1C: 7.6 % — AB (ref 4.0–5.6)

## 2022-06-02 NOTE — Assessment & Plan Note (Signed)
Doing well on current regimen, no changes made today. Obtaining labs.

## 2022-06-02 NOTE — Assessment & Plan Note (Signed)
A1c improved. Recommend compliance with farxiga. F/u 3 months for repeat a1c given missed doses of farxiga. UTD on foot, eye screenings, PNA vaccine, urine micro.

## 2022-06-02 NOTE — Assessment & Plan Note (Signed)
Doing well on current regimen, no changes made today. 

## 2022-06-02 NOTE — Assessment & Plan Note (Signed)
Recommend compliance with mouth piece

## 2022-06-02 NOTE — Progress Notes (Deleted)
Established patient visit   Patient: Jonathan Fields   DOB: 1956-01-13   66 y.o. Male  MRN: [id number] Visit Date: 06/02/2022  Today's healthcare provider: Myles Gip, DO   I,Tiffany J Bragg,acting as a scribe for Myles Gip, DO.,have documented all relevant documentation on the behalf of Myles Gip, DO,as directed by  Myles Gip, DO while in the presence of Myles Gip, DO.   Chief Complaint  Patient presents with   Diabetes   Subjective    HPI  ***  Medications: Outpatient Medications Prior to Visit  Medication Sig   alfuzosin (UROXATRAL) 10 MG 24 hr tablet Take 1 tablet (10 mg total) by mouth daily with breakfast.   atorvastatin (LIPITOR) 40 MG tablet Take 1 tablet (40 mg total) by mouth daily.   diphenhydramine-acetaminophen (TYLENOL PM) 25-500 MG TABS tablet Take 1 tablet by mouth at bedtime as needed.   fluticasone (FLONASE) 50 MCG/ACT nasal spray 1-2 sprays daily as needed.   gabapentin (NEURONTIN) 300 MG capsule TAKE 1 CAPSULE(300 MG) BY MOUTH AT BEDTIME   hydrochlorothiazide (HYDRODIURIL) 12.5 MG tablet Take 1 tablet (12.5 mg total) by mouth daily.   lisinopril (ZESTRIL) 40 MG tablet TAKE 1 TABLET BY MOUTH DAILY   meloxicam (MOBIC) 15 MG tablet Take 15 mg by mouth daily.   metFORMIN (GLUCOPHAGE-XR) 500 MG 24 hr tablet TAKE 2 TABLETS(1000 MG) BY MOUTH IN THE MORNING AND AT BEDTIME   MYRBETRIQ 50 MG TB24 tablet Take 1 tablet (50 mg total) by mouth daily.   NEEDLE, DISP, 21 G (BD SAFETYGLIDE SHIELDED NEEDLE) 21G X 1-1/2" MISC Use 21guage needle to inject Testosterone Cypionate 53m.   omeprazole (PRILOSEC) 20 MG capsule Take 20 mg by mouth daily.   Protein POWD Take 1 Scoop by mouth daily.   rifampin (RIFADIN) 300 MG capsule Take 1 capsule (300 mg total) by mouth 2 (two) times daily.   testosterone cypionate (DEPOTESTOSTERONE CYPIONATE) 200 MG/ML injection INJECT 0.5 ML IN THE MUSCLE EVERY WEEK   dapagliflozin propanediol (FARXIGA) 10  MG TABS tablet Take 1 tablet (10 mg total) by mouth daily. (Patient not taking: Reported on 06/02/2022)   dapagliflozin propanediol (FARXIGA) 10 MG TABS tablet Take 1 tablet (10 mg total) by mouth daily. (Patient not taking: Reported on 06/02/2022)   lactulose (CHRONULAC) 10 GM/15ML solution Take 30 mLs (20 g total) by mouth 2 (two) times daily as needed for mild constipation or moderate constipation. (Patient not taking: Reported on 06/02/2022)   ondansetron (ZOFRAN) 4 MG tablet Take 1 tablet (4 mg total) by mouth every 8 (eight) hours as needed for nausea or vomiting. (Patient not taking: Reported on 06/02/2022)   No facility-administered medications prior to visit.    Review of Systems  {Labs  Heme  Chem  Endocrine  Serology  Results Review (optional):23779}   Objective    BP (!) 144/98 (BP Location: Left Arm, Patient Position: Sitting, Cuff Size: Normal)   Pulse 90   Temp 98.6 F (37 C) (Oral)   Resp 17   Ht '6\' 2"'$  (1.88 m)   Wt 227 lb 6.4 oz (103.1 kg)   SpO2 97%   BMI 29.20 kg/m  {Show previous vital signs (optional):23777}  Physical Exam  ***  No results found for any visits on 06/02/22.  Assessment & Plan     ***  No follow-ups on file.      {provider attestation***:1}   AMyles Gip DO  [facility] [phone] (phone) [phone] (fax)  [place]

## 2022-06-02 NOTE — Progress Notes (Signed)
   SUBJECTIVE:   CHIEF COMPLAINT / HPI:   Hypertension, OSA: - Medications: HCTZ, lisinopril - Compliance: good - Checking BP at home: no - Denies any SOB, CP, vision changes, LE edema, medication SEs, or symptoms of hypotension - Diet: see below - Exercise: see below - CPAP? No, has mouth piece but refuses to use.  Diabetes, Type 2 - Last A1c 8.3 01/2022 - Medications: farxiga, metformin - Compliance: not taking farxiga about a month.  - Checking BG at home: no - Diet: some sweets, loves potatoes - Exercise: weight lifting 2-3x per week, walking on treadmill (hasn't been doing since surgery) - Eye exam: UTD - Foot exam: UTD - Microalbumin: UTD - Statin: yes - PNA vaccine: UTD - Denies symptoms of hypoglycemia, polyuria, polydipsia, numbness extremities, foot ulcers/trauma  GERD - Meds: omeprazole - Symptoms:  heartburn.  -  denies dysphagia has not lost weight denies melena, hematochezia, hematemesis, and coffee ground emesis.   Neck Pain - started 10 days ago - L base of neck - constant - no known inciting event.  - nothing makes better. - worse in the evenings, worse with certain positions (sidebending to L side, flexion). - has tried meloxicam, tylenol - sees Dr. Sabra Heck for shoulder pain. Got cortisone shot 2 weeks ago. - no fevers, redness, swelling. - no radiation of pain down arm  OBJECTIVE:   BP 138/85   Pulse 90   Temp 98.6 F (37 C) (Oral)   Resp 17   Ht '6\' 2"'$  (1.88 m)   Wt 227 lb 6.4 oz (103.1 kg)   SpO2 97%   BMI 29.20 kg/m   Gen: well appearing, in NAD Card: RRR Lungs: CTAB MSK: Neck: limited flexion, L sidebending 2/2 pain. No asymmetry, swelling. Ext: WWP, trace b/l pitting edema   ASSESSMENT/PLAN:   Sleep apnea Recommend compliance with mouth piece  Hypertension associated with diabetes ([place]) Doing well on current regimen, no changes made today. Obtaining labs.  Gastroesophageal reflux disease without esophagitis Doing well on  current regimen, no changes made today.  T2DM (type 2 diabetes mellitus) (HCC) A1c improved. Recommend compliance with farxiga. F/u 3 months for repeat a1c given missed doses of farxiga. UTD on foot, eye screenings, PNA vaccine, urine micro.     Neck pain - discussed possible causes, suspect vertebral arthritis/impingement given symptoms. Discussed imaging however elects to get with Ortho. Continue NSAIDs for pain relief.   Myles Gip, DO

## 2022-06-02 NOTE — Patient Instructions (Signed)
It was great to see you!  Our plans for today:  - Start back on the [place]. Let us know if you have issues taking this. - We are checking some labs today, we will release these results to your MyChart. - Come back in 3 months.  Take care and seek immediate care sooner if you develop any concerns.   Dr. Ky Barban

## 2022-06-03 LAB — COMPREHENSIVE METABOLIC PANEL
ALT: 19 IU/L (ref 0–44)
AST: 18 IU/L (ref 0–40)
Albumin/Globulin Ratio: 1.8 (ref 1.2–2.2)
Albumin: 4.2 g/dL (ref 3.8–4.8)
Alkaline Phosphatase: 80 IU/L (ref 44–121)
BUN/Creatinine Ratio: 14 (ref 10–24)
BUN: 13 mg/dL (ref 8–27)
Bilirubin Total: 0.7 mg/dL (ref 0.0–1.2)
CO2: 19 mmol/L — ABNORMAL LOW (ref 20–29)
Calcium: 9.4 mg/dL (ref 8.6–10.2)
Chloride: 105 mmol/L (ref 96–106)
Creatinine, Ser: 0.94 mg/dL (ref 0.76–1.27)
Globulin, Total: 2.4 g/dL (ref 1.5–4.5)
Glucose: 164 mg/dL — ABNORMAL HIGH (ref 70–99)
Potassium: 4.2 mmol/L (ref 3.5–5.2)
Sodium: 142 mmol/L (ref 134–144)
Total Protein: 6.6 g/dL (ref 6.0–8.5)
eGFR: 89 mL/min/{1.73_m2} (ref >59)

## 2022-06-03 LAB — LIPID PANEL
Chol/HDL Ratio: 5.8 ratio — ABNORMAL HIGH (ref 0.0–5.0)
Cholesterol, Total: 198 mg/dL (ref 100–199)
HDL: 34 mg/dL — ABNORMAL LOW (ref >39)
LDL Chol Calc (NIH): 107 mg/dL — ABNORMAL HIGH (ref 0–99)
Triglycerides: 330 mg/dL — ABNORMAL HIGH (ref 0–149)
VLDL Cholesterol Cal: 57 mg/dL — ABNORMAL HIGH (ref 5–40)

## 2022-06-04 ENCOUNTER — Telehealth: Payer: Self-pay

## 2022-06-04 DIAGNOSIS — M542 Cervicalgia: Secondary | ICD-10-CM | POA: Diagnosis not present

## 2022-06-04 MED ORDER — ATORVASTATIN CALCIUM 80 MG PO TABS: 40.0000 mg | ORAL_TABLET | Freq: Every day | ORAL | 0 refills | Status: DC

## 2022-06-04 NOTE — Telephone Encounter (Signed)
-----   Message from Myles Gip, DO sent at 06/04/2022 11:24 AM EDT ----- Cholesterol is still not controlled. Is he taking statin regularly? If so, I recommend increasing his lipitor to '80mg'$ . Let me know if he is ok with this and I will send in.   He should also focus on eating lots of fruits and vegetables and avoiding fried, fatty, and overly processed foods. Increasing activity level can also help with this.

## 2022-06-04 NOTE — Telephone Encounter (Signed)
Prescription send in.

## 2022-06-09 ENCOUNTER — Ambulatory Visit (INDEPENDENT_AMBULATORY_CARE_PROVIDER_SITE_OTHER): Payer: BC Managed Care – PPO | Admitting: Vascular Surgery

## 2022-06-09 ENCOUNTER — Encounter (INDEPENDENT_AMBULATORY_CARE_PROVIDER_SITE_OTHER): Payer: Self-pay | Admitting: Vascular Surgery

## 2022-06-09 VITALS — BP 143/85 | HR 85 | Resp 16 | Wt 223.6 lb

## 2022-06-09 DIAGNOSIS — I8312 Varicose veins of left lower extremity with inflammation: Secondary | ICD-10-CM | POA: Diagnosis not present

## 2022-06-09 DIAGNOSIS — I8311 Varicose veins of right lower extremity with inflammation: Secondary | ICD-10-CM | POA: Diagnosis not present

## 2022-06-13 ENCOUNTER — Encounter (INDEPENDENT_AMBULATORY_CARE_PROVIDER_SITE_OTHER): Payer: Self-pay | Admitting: Vascular Surgery

## 2022-06-13 NOTE — Progress Notes (Signed)
   Indication:  Patient presents with symptomatic varicose veins of the bilateral lower extremity.  Procedure:  Sclerotherapy using hypertonic saline mixed with 1% Lidocaine was performed on the bilateral lower extremity.  Compression wraps were placed.  The patient tolerated the procedure well.  Plan:  Follow up as needed.  

## 2022-06-28 ENCOUNTER — Other Ambulatory Visit: Payer: Self-pay | Admitting: Family Medicine

## 2022-06-30 NOTE — Telephone Encounter (Signed)
Requested Prescriptions  Pending Prescriptions Disp Refills  . gabapentin (NEURONTIN) 300 MG capsule [Pharmacy Med Name: GABAPENTIN '300MG'$  CAPSULES] 90 capsule 0    Sig: TAKE 1 CAPSULE(300 MG) BY MOUTH AT BEDTIME     Neurology: Anticonvulsants - gabapentin Passed - 06/28/2022 11:11 AM      Passed - Cr in normal range and within 360 days    Creatinine, Ser  Date Value Ref Range Status  06/02/2022 0.94 0.76 - 1.27 mg/dL Final         Passed - Completed PHQ-2 or PHQ-9 in the last 360 days      Passed - Valid encounter within last 12 months    Recent Outpatient Visits          4 weeks ago Type 2 diabetes mellitus with other specified complication, without long-term current use of insulin [facility])   [facility], [place], DO   4 months ago Type 2 diabetes mellitus with other specified complication, without long-term current use of insulin [facility])   [facility], Dionne Bucy, MD   8 months ago Encounter for annual physical exam   [facility] [place], Dionne Bucy, MD   1 year ago Type 2 diabetes mellitus with other specified complication, without long-term current use of insulin [facility])   [facility], Dionne Bucy, MD   1 year ago Rotator cuff arthropathy of left shoulder   [facility] [name], Dionne Bucy, MD      Future Appointments            In 7 months [place], Ronda Fairly, MD [place]

## 2022-07-16 ENCOUNTER — Telehealth: Payer: Self-pay | Admitting: Family Medicine

## 2022-07-16 ENCOUNTER — Other Ambulatory Visit: Payer: Self-pay

## 2022-07-16 MED ORDER — LISINOPRIL 40 MG PO TABS: 40.0000 mg | ORAL_TABLET | Freq: Every day | ORAL | 0 refills | Status: DC

## 2022-07-16 NOTE — Telephone Encounter (Signed)
[place] faxed refill request for the following medications:  lisinopril (ZESTRIL) 40 MG tablet   Please advise.

## 2022-07-24 ENCOUNTER — Inpatient Hospital Stay: Payer: BC Managed Care – PPO | Attending: Oncology

## 2022-07-24 ENCOUNTER — Encounter: Payer: Self-pay | Admitting: Oncology

## 2022-07-24 ENCOUNTER — Inpatient Hospital Stay (HOSPITAL_BASED_OUTPATIENT_CLINIC_OR_DEPARTMENT_OTHER): Payer: BC Managed Care – PPO | Admitting: Oncology

## 2022-07-24 ENCOUNTER — Inpatient Hospital Stay: Payer: BC Managed Care – PPO

## 2022-07-24 VITALS — BP 103/84 | HR 100 | Temp 98.7°F | Resp 19 | Wt 230.4 lb

## 2022-07-24 DIAGNOSIS — Z79890 Hormone replacement therapy (postmenopausal): Secondary | ICD-10-CM | POA: Insufficient documentation

## 2022-07-24 DIAGNOSIS — D751 Secondary polycythemia: Secondary | ICD-10-CM | POA: Diagnosis not present

## 2022-07-24 DIAGNOSIS — D500 Iron deficiency anemia secondary to blood loss (chronic): Secondary | ICD-10-CM

## 2022-07-24 LAB — CBC WITH DIFFERENTIAL/PLATELET
Abs Immature Granulocytes: 0.02 10*3/uL (ref 0.00–0.07)
Basophils Absolute: 0 10*3/uL (ref 0.0–0.1)
Basophils Relative: 1 %
Eosinophils Absolute: 0.1 10*3/uL (ref 0.0–0.5)
Eosinophils Relative: 2 %
HCT: 47.7 % (ref 39.0–52.0)
Hemoglobin: 15.3 g/dL (ref 13.0–17.0)
Immature Granulocytes: 0 %
Lymphocytes Relative: 23 %
Lymphs Abs: 1.2 10*3/uL (ref 0.7–4.0)
MCH: 25.7 pg — ABNORMAL LOW (ref 26.0–34.0)
MCHC: 32.1 g/dL (ref 30.0–36.0)
MCV: 80 fL (ref 80.0–100.0)
Monocytes Absolute: 0.7 10*3/uL (ref 0.1–1.0)
Monocytes Relative: 14 %
Neutro Abs: 3 10*3/uL (ref 1.7–7.7)
Neutrophils Relative %: 60 %
Platelets: 215 10*3/uL (ref 150–400)
RBC: 5.96 MIL/uL — ABNORMAL HIGH (ref 4.22–5.81)
RDW: 15.5 % (ref 11.5–15.5)
WBC: 5 10*3/uL (ref 4.0–10.5)
nRBC: 0 % (ref 0.0–0.2)

## 2022-07-24 NOTE — Progress Notes (Signed)
HCT 47.7. Parameters are if HCT > 50. No phlebotomy today per MD.

## 2022-07-25 ENCOUNTER — Other Ambulatory Visit: Payer: Self-pay

## 2022-07-25 ENCOUNTER — Encounter: Payer: Self-pay | Admitting: Oncology

## 2022-07-25 DIAGNOSIS — E291 Testicular hypofunction: Secondary | ICD-10-CM

## 2022-07-25 NOTE — Progress Notes (Signed)
Hematology/Oncology Progress note [phone] [phone]      Patient Care Team: Virginia Crews, MD as PCP - General (Family Medicine) Earlie Server, MD as Consulting Physician (Oncology)  REFERRING PROVIDER: Virginia Crews, MD  CHIEF COMPLAINTS/REASON FOR VISIT:  follo wup  of polycytosis/erythrocytosis  HISTORY OF PRESENTING ILLNESS:  Jonathan Fields is a 66 y.o. male who was seen in consultation at the request of Brita Romp, Dionne Bucy, MD for evaluation of polycytosis/erythrocytosis Reviewed patient's recent lab work 03/14/2020, hematocrit 55, 09/13/2019 hematocrit 51.8, 07/22/2019, hematocrit 49.1.  Hemoglobin 17.2.   Patient is on testosterone replacement.  Currently Dr. Bernardo Heater hold off treatments due to erythrocytosis. Patient was is Dr. Dennis Bast to hematology oncology for evaluation and management. Associated signs or symptoms: Patient mention about 15 pounds of unintentional weight loss recently during the past few weeks.  His appetite is unchanged.  He feels that he has eaten similar amount of food recently.  Denies any increase of activity level.  Some sweating for the past 2 days.  Context:  Smoking history: Denies. Testosterone supplements: History of blood clots: Previously he has an episode of provoked thrombosis after surgery. Daytime somnolence: Denies Family history of polycythemia: Denies  Patient underwent stabilization of right foot complicated by wound dehiscence, septic arthritis of the right ankle, acute osteomyelitis.  Patient was taken to the OR for washout and debridement in December 2022.  Finished antibiotic course.  03/17/2022 for Sclerotherapy for symptomatic varicose veins of the right lower extremity.  INTERVAL HISTORY Jonathan Fields is a 66 y.o. 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  Today he feels well.  No new complaints.  Denies any arthralgia, fever, chills.  Patient follows up with vascular surgery and was recently seen on  Review of Systems  Constitutional:  Negative for appetite change, chills, fatigue, fever and unexpected weight change.  HENT:   Negative for hearing loss and voice change.   Eyes:  Negative for eye problems and icterus.  Respiratory:  Negative for chest tightness, cough and shortness of breath.   Cardiovascular:  Negative for chest pain and leg swelling.  Gastrointestinal:  Negative for abdominal distention and abdominal pain.  Endocrine: Negative for hot flashes.  Genitourinary:  Negative for difficulty urinating, dysuria and frequency.   Musculoskeletal:  Negative for arthralgias.  Skin:  Negative for itching and rash.  Neurological:  Negative for light-headedness and numbness.  Hematological:  Negative for adenopathy. Does not bruise/bleed easily.  Psychiatric/Behavioral:  Negative for confusion.     MEDICAL HISTORY:  Past Medical History:  Diagnosis Date   BPH (benign prostatic hyperplasia)    Bronchitis    Complication of anesthesia    hard to wake up   DM (diabetes mellitus), type 2 ([place])    DVT (deep venous thrombosis) ([place])    post op   ED (erectile dysfunction)    Erythrocytosis 07/04/2020   GERD (gastroesophageal reflux disease)    Hyperlipemia    Hypertension    Hypogonadism in male    Nocturia    Right ankle instability    Sleep apnea    TB lung, latent    Urinary frequency     SURGICAL HISTORY: Past Surgical History:  Procedure Laterality Date   ANKLE ARTHROSCOPY Right 11/13/2021   Procedure: ANKLE ARTHROSCOPY Arthroscopic Irrigation;  Surgeon: Sherryle Lis,  Stephan Minister, DPM;  Location: [facility] ORS;  Service: Podiatry;  Laterality: Right;   ANKLE SURGERY Left    torn ligament   CARPAL TUNNEL RELEASE Right 08/2019   COLONOSCOPY  2009   COLONOSCOPY WITH PROPOFOL N/A  01/23/2022   Procedure: COLONOSCOPY WITH PROPOFOL;  Surgeon: [name], MD;  [facility] END[facility]COPY;  Service: Endoscopy;  Laterality: N/A;   IRRIGATION AND DEBRIDEMENT ABSCESS Right 11/13/2021   Procedure: IRRIGATION AND DEBRIDEMENT ABSCESS;  Surgeon: [name], DPM;  Location: [facility] ORS;  Service: Podiatry;  Laterality: Right;   IRRIGATION AND DEBRIDEMENT FOOT Right 11/16/2021   Procedure: IRRIGATION AND DEBRIDEMENT ANKLE;  Surgeon: Edrick Kins, DPM;  Location: [facility] ORS;  Service: Podiatry;  Laterality: Right;   KNEE ARTHROSCOPY Left    right ankle ruptured tendon surgery  2008   TRANSURETHRAL RESECTION OF PROSTATE N/A 02/19/2018   Procedure: TRANSURETHRAL RESECTION OF THE PROSTATE (TURP);  Surgeon: [name], MD;  Location: [facility] ORS;  Service: Urology;  Laterality: N/A;    SOCIAL HISTORY: Social History   Socioeconomic History   Marital status: Married    Spouse name: Not on file   Number of children: 0   Years of education: Not on file   Highest education level: Not on file  Occupational History   Not on file  Tobacco Use   Smoking status: Never   Smokeless tobacco: Current    Types: Snuff   Tobacco comments:    tobaco pouches that produce juice  Vaping Use   Vaping Use: Never used  Substance and Sexual Activity   Alcohol use: Yes    Comment: COUPLE OF DRINKS EVERY WEEK   Drug use: No   Sexual activity: Yes    Partners: Female    Birth control/protection: None  Other Topics Concern   Not on file  Social History Narrative   Not on file   Social Determinants of Health   Financial Resource Strain: Not on file  Food Insecurity: Not on file  Transportation Needs: Not on file  Physical Activity: Not on file  Stress: Not on file  Social Connections: Not on file  Intimate Partner Violence: Not on file    FAMILY HISTORY: Family History  Problem Relation Age of Onset   Heart disease Father    Hypertension Father    Prostate cancer Father     Heart disease Mother    Breast cancer Mother    Colon cancer Neg Hx     ALLERGIES:  is allergic to penicillins.  MEDICATIONS:  Current Outpatient Medications  Medication Sig Dispense Refill   alfuzosin (UROXATRAL) 10 MG 24 hr tablet Take 1 tablet (10 mg total) by mouth daily with breakfast. 30 tablet 11   diphenhydramine-acetaminophen (TYLENOL PM) 25-500 MG TABS tablet Take 1 tablet by mouth at bedtime as needed.     fluticasone (FLONASE) 50 MCG/ACT nasal spray 1-2 sprays daily as needed.  0   gabapentin (NEURONTIN) 300 MG capsule TAKE 1 CAPSULE(300 MG) BY MOUTH AT BEDTIME 90 capsule 0   lisinopril (ZESTRIL) 40 MG tablet Take 1 tablet (40 mg total) by mouth daily. 90 tablet 0   meloxicam (MOBIC) 15 MG tablet Take 15 mg by mouth daily.     metFORMIN (GLUCOPHAGE-XR) 500 MG 24 hr tablet TAKE 2 TABLETS(1000 MG) BY MOUTH IN THE MORNING AND AT BEDTIME 360 tablet 1   NEEDLE, DISP, 21 G (BD SAFETYGLIDE SHIELDED NEEDLE) 21G X 1-1/2" MISC Use 21guage needle to inject Testosterone Cypionate  43m. 25 each 0   omeprazole (PRILOSEC) 20 MG capsule Take 20 mg by mouth daily.     Protein POWD Take 1 Scoop by mouth daily.     rifampin (RIFADIN) 300 MG capsule Take 1 capsule (300 mg total) by mouth 2 (two) times daily. 28 capsule 0   testosterone cypionate (DEPOTESTOSTERONE CYPIONATE) 200 MG/ML injection INJECT 0.5 ML IN THE MUSCLE EVERY WEEK 10 mL 0   atorvastatin (LIPITOR) 80 MG tablet Take 0.5 tablets (40 mg total) by mouth daily. (Patient not taking: Reported on 07/24/2022) 90 tablet 0   dapagliflozin propanediol (FARXIGA) 10 MG TABS tablet Take 1 tablet (10 mg total) by mouth daily. (Patient not taking: Reported on 06/02/2022) 90 tablet 1   dapagliflozin propanediol (FARXIGA) 10 MG TABS tablet Take 1 tablet (10 mg total) by mouth daily. (Patient not taking: Reported on 06/02/2022) 90 tablet 1   hydrochlorothiazide (HYDRODIURIL) 12.5 MG tablet Take 1 tablet (12.5 mg total) by mouth daily. (Patient not taking:  Reported on 07/24/2022) 90 tablet 3   MYRBETRIQ 50 MG TB24 tablet Take 1 tablet (50 mg total) by mouth daily. (Patient not taking: Reported on 07/24/2022) 30 tablet 11   No current facility-administered medications for this visit.     PHYSICAL EXAMINATION: ECOG PERFORMANCE STATUS: 0 - Asymptomatic Vitals:   07/24/22 1334  BP: 103/84  Pulse: 100  Resp: 19  Temp: 98.7 F (37.1 C)  SpO2: 97%   Filed Weights   07/24/22 1334  Weight: 230 lb 6.4 oz (104.5 kg)    Physical Exam Constitutional:      General: He is not in acute distress. HENT:     Head: Normocephalic and atraumatic.  Eyes:     General: No scleral icterus. Cardiovascular:     Rate and Rhythm: Normal rate.  Pulmonary:     Effort: Pulmonary effort is normal. No respiratory distress.  Abdominal:     General: There is no distension.     Palpations: Abdomen is soft.  Musculoskeletal:        General: No deformity. Normal range of motion.     Cervical back: Normal range of motion and neck supple.  Skin:    General: Skin is warm and dry.  Neurological:     Mental Status: He is alert and oriented to person, place, and time. Mental status is at baseline.     Cranial Nerves: No cranial nerve deficit.     Coordination: Coordination normal.  Psychiatric:        Mood and Affect: Mood normal.     RADIOGRAPHIC STUDIES: I have personally reviewed the radiological images as listed and agreed with the findings in the report. No results found.   LABORATORY DATA:  I have reviewed the data as listed Lab Results  Component Value Date   WBC 5.0 07/24/2022   HGB 15.3 07/24/2022   HCT 47.7 07/24/2022   MCV 80.0 07/24/2022   PLT 215 07/24/2022   Recent Labs    09/19/21 1339 11/10/21 1046 11/11/21 0427 11/14/21 0149 11/15/21 0457 11/18/21 0319 06/02/22 1544  NA 135 133*  --  134*  --  133* 142  K 4.1 4.1  --  3.9  --  4.0 4.2  CL 101 100  --  102  --  101 105  CO2 26 24  --  24  --  26 19*  GLUCOSE 263* 275*  --   217*  --  175* 164*  BUN 19 15  --  21  --  17 13  CREATININE 0.87 0.95   < > 0.85 0.71 0.86 0.94  CALCIUM 8.8* 8.7*  --  8.4*  --  8.4* 9.4  GFRNONAA >60 >60   < > >60 >60 >60  --   PROT 6.7 7.2  --   --   --   --  6.6  ALBUMIN 3.6 3.7  --   --   --   --  4.2  AST 32 16  --   --   --   --  18  ALT 48* 17  --   --   --   --  19  ALKPHOS 60 57  --   --   --   --  80  BILITOT 1.2 1.6*  --   --   --   --  0.7   < > = values in this interval not displayed.    ASSESSMENT & PLAN:  1. Secondary erythrocytosis   2. Long-term current use of testosterone replacement therapy     #Secondary erythrocytosis due to chronic testosterone use. Labs are reviewed and discussed with patient. Stable hemoglobin 15.3, Hct 47.  No phlebotomy given that hematocrit is less than 50 Hemoglobin has been stable.  Recommend him to follow up in 6 months.  Follow-up with urology for testosterone replacement management.   Continue lab MD phlebotomy in 6 months.  Orders Placed This Encounter  Procedures   CBC with Differential/Platelet    Standing Status:   Future    Standing Expiration Date:   07/25/2023    We spent sufficient time to discuss many aspect of care, questions were answered to patient's satisfaction. The patient knows to call the clinic with any problems questions or concerns.  Earlie Server, MD, PhD 07/25/2022

## 2022-07-30 ENCOUNTER — Other Ambulatory Visit: Payer: Self-pay | Admitting: Family Medicine

## 2022-08-03 MED ORDER — TESTOSTERONE CYPIONATE 200 MG/ML IM SOLN: INTRAMUSCULAR | 0 refills | Status: DC

## 2022-08-12 ENCOUNTER — Other Ambulatory Visit: Payer: BC Managed Care – PPO

## 2022-08-12 DIAGNOSIS — E291 Testicular hypofunction: Secondary | ICD-10-CM

## 2022-08-13 LAB — TESTOSTERONE: Testosterone: 103 ng/dL — ABNORMAL LOW (ref 264–916)

## 2022-08-13 LAB — HEMATOCRIT: Hematocrit: 52 % — ABNORMAL HIGH (ref 37.5–51.0)

## 2022-08-14 ENCOUNTER — Encounter: Payer: Self-pay | Admitting: *Deleted

## 2022-08-14 ENCOUNTER — Telehealth: Payer: Self-pay | Admitting: Urology

## 2022-08-14 NOTE — Telephone Encounter (Signed)
Hematocrit was elevated at 53.0.  Testosterone level low at 103.  When was his last injection in relation to this blood draw?

## 2022-08-27 ENCOUNTER — Other Ambulatory Visit: Payer: Self-pay | Admitting: Family Medicine

## 2022-08-28 NOTE — Telephone Encounter (Signed)
Requested Prescriptions  Pending Prescriptions Disp Refills  . gabapentin (NEURONTIN) 300 MG capsule [Pharmacy Med Name: GABAPENTIN '300MG'$  CAPSULES] 90 capsule 0    Sig: TAKE 1 CAPSULE(300 MG) BY MOUTH AT BEDTIME     Neurology: Anticonvulsants - gabapentin Passed - 08/27/2022  8:08 PM      Passed - Cr in normal range and within 360 days    Creatinine, Ser  Date Value Ref Range Status  06/02/2022 0.94 0.76 - 1.27 mg/dL Final         Passed - Completed PHQ-2 or PHQ-9 in the last 360 days      Passed - Valid encounter within last 12 months    Recent Outpatient Visits          2 months ago Type 2 diabetes mellitus with other specified complication, without long-term current use of insulin [facility])   [facility] Myles Gip, DO   6 months ago Type 2 diabetes mellitus with other specified complication, without long-term current use of insulin [facility])   [facility], Dionne Bucy, MD   10 months ago Encounter for annual physical exam   [facility] [place], Dionne Bucy, MD   1 year ago Type 2 diabetes mellitus with other specified complication, without long-term current use of insulin [facility])   [facility], Dionne Bucy, MD   1 year ago Rotator cuff arthropathy of left shoulder   [facility] [name], Dionne Bucy, MD      Future Appointments            In 5 months [place], Ronda Fairly, MD [place]

## 2022-08-28 NOTE — Telephone Encounter (Signed)
Notified patient as instructed, patient pleased. Patient states he had not give his injections in two weeks before lab work. He states he was doing 1 ml every two weeks. I advised him that the testosterone states he needs to be giving 0.46m every week.

## 2022-09-01 ENCOUNTER — Telehealth: Payer: Self-pay | Admitting: Urology

## 2022-09-01 NOTE — Telephone Encounter (Signed)
Patient called the office today to request more information about his hematocrit levels.  I advised him that the most recent result was 53.  Patient is asking if he needs to make an appointment with Dr. Tasia Catchings to get his blood drawn.   Please advise. Patient can be reached at [phone].

## 2022-09-18 DIAGNOSIS — M19012 Primary osteoarthritis, left shoulder: Secondary | ICD-10-CM | POA: Diagnosis not present

## 2022-09-18 DIAGNOSIS — M19011 Primary osteoarthritis, right shoulder: Secondary | ICD-10-CM | POA: Diagnosis not present

## 2022-09-29 ENCOUNTER — Encounter (INDEPENDENT_AMBULATORY_CARE_PROVIDER_SITE_OTHER): Payer: Self-pay

## 2022-10-11 ENCOUNTER — Other Ambulatory Visit: Payer: Self-pay | Admitting: Family Medicine

## 2022-10-13 ENCOUNTER — Telehealth: Payer: Self-pay | Admitting: Family Medicine

## 2022-10-13 ENCOUNTER — Other Ambulatory Visit: Payer: Self-pay

## 2022-10-13 DIAGNOSIS — I152 Hypertension secondary to endocrine disorders: Secondary | ICD-10-CM

## 2022-10-13 MED ORDER — LISINOPRIL 40 MG PO TABS: 40.0000 mg | ORAL_TABLET | Freq: Every day | ORAL | 0 refills | Status: DC

## 2022-10-13 MED ORDER — HYDROCHLOROTHIAZIDE 12.5 MG PO TABS: 12.5000 mg | ORAL_TABLET | Freq: Every day | ORAL | 3 refills | Status: DC

## 2022-10-13 MED ORDER — HYDROCHLOROTHIAZIDE 12.5 MG PO TABS: 12.5000 mg | ORAL_TABLET | Freq: Every day | ORAL | 0 refills | Status: DC

## 2022-10-13 NOTE — Telephone Encounter (Signed)
[place] faxed refill request for the following medications:   lisinopril (ZESTRIL) 40 MG tablet     hydrochlorothiazide (HYDRODIURIL) 12.5 MG tablet  Please advise

## 2022-10-22 DIAGNOSIS — M19012 Primary osteoarthritis, left shoulder: Secondary | ICD-10-CM | POA: Diagnosis not present

## 2022-10-22 DIAGNOSIS — M25512 Pain in left shoulder: Secondary | ICD-10-CM | POA: Diagnosis not present

## 2022-10-22 DIAGNOSIS — M19011 Primary osteoarthritis, right shoulder: Secondary | ICD-10-CM | POA: Diagnosis not present

## 2022-10-29 DIAGNOSIS — M25512 Pain in left shoulder: Secondary | ICD-10-CM | POA: Diagnosis not present

## 2022-11-07 DIAGNOSIS — M19012 Primary osteoarthritis, left shoulder: Secondary | ICD-10-CM | POA: Diagnosis not present

## 2022-11-16 ENCOUNTER — Other Ambulatory Visit: Payer: Self-pay | Admitting: Urology

## 2022-12-18 ENCOUNTER — Ambulatory Visit: Payer: BC Managed Care – PPO | Admitting: Family Medicine

## 2022-12-22 ENCOUNTER — Encounter: Payer: Self-pay | Admitting: Family Medicine

## 2022-12-22 ENCOUNTER — Ambulatory Visit: Payer: BC Managed Care – PPO | Admitting: Family Medicine

## 2022-12-22 VITALS — BP 110/70 | HR 98 | Temp 98.2°F | Resp 16 | Wt 232.0 lb

## 2022-12-22 DIAGNOSIS — I152 Hypertension secondary to endocrine disorders: Secondary | ICD-10-CM | POA: Diagnosis not present

## 2022-12-22 DIAGNOSIS — E1169 Type 2 diabetes mellitus with other specified complication: Secondary | ICD-10-CM

## 2022-12-22 DIAGNOSIS — E1159 Type 2 diabetes mellitus with other circulatory complications: Secondary | ICD-10-CM

## 2022-12-22 DIAGNOSIS — E785 Hyperlipidemia, unspecified: Secondary | ICD-10-CM

## 2022-12-22 LAB — POCT GLYCOSYLATED HEMOGLOBIN (HGB A1C)
Est. average glucose Bld gHb Est-mCnc: 223
Hemoglobin A1C: 9.4 % — AB (ref 4.0–5.6)

## 2022-12-22 MED ORDER — ALFUZOSIN HCL ER 10 MG PO TB24: 10.0000 mg | ORAL_TABLET | Freq: Every day | ORAL | 11 refills | Status: DC

## 2022-12-22 MED ORDER — DAPAGLIFLOZIN PROPANEDIOL 10 MG PO TABS: 10.0000 mg | ORAL_TABLET | Freq: Every day | ORAL | 1 refills | Status: DC

## 2022-12-22 NOTE — Assessment & Plan Note (Signed)
Reports home blood pressures are typically well-controlled, but do fluctuate Encouraged monitoring of home blood pressures Stopped HCTZ because he was getting low blood pressures Continue lisinopril at current dose No changes today, but may need to increase regimen in the future if still elevated, especially on home readings Recheck metabolic panel

## 2022-12-22 NOTE — Progress Notes (Signed)
I,Sulibeya S Dimas,acting as a scribe for Lavon Paganini, MD.,have documented all relevant documentation on the behalf of Lavon Paganini, MD,as directed by  Lavon Paganini, MD while in the presence of Lavon Paganini, MD.     Established patient visit   Patient: Jonathan Fields   DOB: 23-Nov-1956   67 y.o. Male  MRN: [id number] Visit Date: 12/22/2022  Today's healthcare provider: Lavon Paganini, MD   Chief Complaint  Patient presents with   Diabetes   Hypertension   Subjective    HPI  Diabetes Mellitus Type II, Follow-up  Lab Results  Component Value Date   HGBA1C 9.4 (A) 12/22/2022   HGBA1C 7.6 (A) 06/02/2022   HGBA1C 8.3 (H) 01/24/2022   Wt Readings from Last 3 Encounters:  12/22/22 232 lb (105.2 kg)  07/24/22 230 lb 6.4 oz (104.5 kg)  06/09/22 223 lb 9.6 oz (101.4 kg)   Last seen for diabetes 7 months ago.  Management since then includes no changes. He reports excellent compliance with treatment. He is not having side effects.   Home blood sugar records:  not being checked  Episodes of hypoglycemia? No    Current insulin regiment: none Most Recent Eye Exam: UTD  Pertinent Labs: Lab Results  Component Value Date   CHOL 198 06/02/2022   HDL 34 (L) 06/02/2022   LDLCALC 107 (H) 06/02/2022   TRIG 330 (H) 06/02/2022   CHOLHDL 5.8 (H) 06/02/2022   Lab Results  Component Value Date   NA 142 06/02/2022   K 4.2 06/02/2022   CREATININE 0.94 06/02/2022   EGFR 89 06/02/2022   LABMICR 15.5 02/25/2022   MICRALBCREAT 22 02/25/2022     --------------------------------------------------------------------------------------------------- Hypertension, follow-up  BP Readings from Last 3 Encounters:  12/22/22 110/70  07/24/22 103/84  06/09/22 (!) 143/85   Wt Readings from Last 3 Encounters:  12/22/22 232 lb (105.2 kg)  07/24/22 230 lb 6.4 oz (104.5 kg)  06/09/22 223 lb 9.6 oz (101.4 kg)     He was last seen for hypertension 6 months ago.  BP  at that visit was 143/85. Management since that visit includes no changes.  He reports excellent compliance with treatment. He is not having side effects.   Use of agents associated with hypertension: none.   Outside blood pressures are not being checked.  Pertinent labs Lab Results  Component Value Date   CHOL 198 06/02/2022   HDL 34 (L) 06/02/2022   LDLCALC 107 (H) 06/02/2022   TRIG 330 (H) 06/02/2022   CHOLHDL 5.8 (H) 06/02/2022   Lab Results  Component Value Date   NA 142 06/02/2022   K 4.2 06/02/2022   CREATININE 0.94 06/02/2022   EGFR 89 06/02/2022   GLUCOSE 164 (H) 06/02/2022   TSH 2.280 04/01/2021     The 10-year ASCVD risk score (Arnett DK, et al., 2019) is: 30.3%  ---------------------------------------------------------------------------------------------------   Medications: Outpatient Medications Prior to Visit  Medication Sig   fluticasone (FLONASE) 50 MCG/ACT nasal spray 1-2 sprays daily as needed.   gabapentin (NEURONTIN) 300 MG capsule TAKE 1 CAPSULE(300 MG) BY MOUTH AT BEDTIME   lisinopril (ZESTRIL) 40 MG tablet Take 1 tablet (40 mg total) by mouth daily.   meloxicam (MOBIC) 15 MG tablet Take 15 mg by mouth daily.   metFORMIN (GLUCOPHAGE-XR) 500 MG 24 hr tablet Take 2 tablets (1,000 mg total) by mouth 2 (two) times daily with a meal. Please schedule office visit before any future refill.   MYRBETRIQ 50 MG TB24 tablet Take  1 tablet (50 mg total) by mouth daily.   NEEDLE, DISP, 21 G (BD SAFETYGLIDE SHIELDED NEEDLE) 21G X 1-1/2" MISC Use 21guage needle to inject Testosterone Cypionate 62m.   omeprazole (PRILOSEC) 20 MG capsule Take 20 mg by mouth daily.   Protein POWD Take 1 Scoop by mouth daily.   testosterone cypionate (DEPOTESTOSTERONE CYPIONATE) 200 MG/ML injection INJECT 0.5 MLS INTRAMUSCULARLY EVERY WEEK   atorvastatin (LIPITOR) 80 MG tablet Take 0.5 tablets (40 mg total) by mouth daily. (Patient not taking: Reported on 07/24/2022)   [DISCONTINUED]  alfuzosin (UROXATRAL) 10 MG 24 hr tablet Take 1 tablet (10 mg total) by mouth daily with breakfast. (Patient not taking: Reported on 12/22/2022)   [DISCONTINUED] dapagliflozin propanediol (FARXIGA) 10 MG TABS tablet Take 1 tablet (10 mg total) by mouth daily. (Patient not taking: Reported on 06/02/2022)   [DISCONTINUED] dapagliflozin propanediol (FARXIGA) 10 MG TABS tablet Take 1 tablet (10 mg total) by mouth daily.   [DISCONTINUED] diphenhydramine-acetaminophen (TYLENOL PM) 25-500 MG TABS tablet Take 1 tablet by mouth at bedtime as needed.   [DISCONTINUED] hydrochlorothiazide (HYDRODIURIL) 12.5 MG tablet Take 1 tablet (12.5 mg total) by mouth daily. (Patient not taking: Reported on 12/22/2022)   [DISCONTINUED] rifampin (RIFADIN) 300 MG capsule Take 1 capsule (300 mg total) by mouth 2 (two) times daily. (Patient not taking: Reported on 12/22/2022)   No facility-administered medications prior to visit.    Review of Systems per HPI     Objective    BP 110/70 Comment: home reading  Pulse 98   Temp 98.2 F (36.8 C) (Temporal)   Resp 16   Wt 232 lb (105.2 kg)   BMI 29.79 kg/m    Physical Exam Vitals reviewed.  Constitutional:      General: He is not in acute distress.    Appearance: Normal appearance. He is not diaphoretic.  HENT:     Head: Normocephalic and atraumatic.  Eyes:     General: No scleral icterus.    Conjunctiva/sclera: Conjunctivae normal.  Cardiovascular:     Rate and Rhythm: Normal rate and regular rhythm.     Pulses: Normal pulses.     Heart sounds: Normal heart sounds. No murmur heard. Pulmonary:     Effort: Pulmonary effort is normal. No respiratory distress.     Breath sounds: Normal breath sounds. No wheezing or rhonchi.  Musculoskeletal:     Cervical back: Neck supple.     Right lower leg: No edema.     Left lower leg: No edema.  Lymphadenopathy:     Cervical: No cervical adenopathy.  Skin:    General: Skin is warm and dry.     Findings: No rash.   Neurological:     Mental Status: He is alert and oriented to person, place, and time. Mental status is at baseline.  Psychiatric:        Mood and Affect: Mood normal.        Behavior: Behavior normal.       Results for orders placed or performed in visit on 12/22/22  POCT glycosylated hemoglobin (Hb A1C)  Result Value Ref Range   Hemoglobin A1C 9.4 (A) 4.0 - 5.6 %   Est. average glucose Bld gHb Est-mCnc 223     Assessment & Plan     Problem List Items Addressed This Visit       Cardiovascular and Mediastinum   Hypertension associated with diabetes ([place]    Reports home blood pressures are typically well-controlled, but do fluctuate Encouraged monitoring  of home blood pressures Stopped HCTZ because he was getting low blood pressures Continue lisinopril at current dose No changes today, but may need to increase regimen in the future if still elevated, especially on home readings Recheck metabolic panel      Relevant Medications   dapagliflozin propanediol (FARXIGA) 10 MG TABS tablet   Other Relevant Orders   Comprehensive metabolic panel   Lipid panel     Endocrine   T2DM (type 2 diabetes mellitus) ([place]) - Primary    Uncontrolled with hyperglycemia Encouraged him to resume his [place], though it is not clear if he was ever taking this consistently He stopped Jardiance in the past because he thought it contributed to weight loss Continue metformin XR at current dose Need for foot exam at upcoming CPE Continue ACE inhibitor and statin      Relevant Medications   dapagliflozin propanediol (FARXIGA) 10 MG TABS tablet   Other Relevant Orders   POCT glycosylated hemoglobin (Hb A1C) (Completed)   Hyperlipidemia associated with type 2 diabetes mellitus ([place])    Previously well controlled Continue statin, but is unclear if the patient is taking this consistently or not Encouraged him to resume pending lipid panel if he is not Repeat FLP and CMP Goal LDL <70       Relevant Medications   dapagliflozin propanediol (FARXIGA) 10 MG TABS tablet   Other Relevant Orders   Comprehensive metabolic panel   Lipid panel     Return in about 2 weeks (around 01/05/2023) for CPE (patient desires ASAP).      I, Lavon Paganini, MD, have reviewed all documentation for this visit. The documentation on 12/22/22 for the exam, diagnosis, procedures, and orders are all accurate and complete.   Ovadia Lopp, Dionne Bucy, MD, MPH [facility]

## 2022-12-22 NOTE — Assessment & Plan Note (Signed)
Uncontrolled with hyperglycemia Encouraged him to resume his [place], though it is not clear if he was ever taking this consistently He stopped Jardiance in the past because he thought it contributed to weight loss Continue metformin XR at current dose Need for foot exam at upcoming CPE Continue ACE inhibitor and statin

## 2022-12-22 NOTE — Assessment & Plan Note (Signed)
Previously well controlled Continue statin, but is unclear if the patient is taking this consistently or not Encouraged him to resume pending lipid panel if he is not Repeat FLP and CMP Goal LDL <70

## 2022-12-26 DIAGNOSIS — E1159 Type 2 diabetes mellitus with other circulatory complications: Secondary | ICD-10-CM | POA: Diagnosis not present

## 2022-12-26 DIAGNOSIS — E785 Hyperlipidemia, unspecified: Secondary | ICD-10-CM | POA: Diagnosis not present

## 2022-12-26 DIAGNOSIS — I152 Hypertension secondary to endocrine disorders: Secondary | ICD-10-CM | POA: Diagnosis not present

## 2022-12-26 DIAGNOSIS — E1169 Type 2 diabetes mellitus with other specified complication: Secondary | ICD-10-CM | POA: Diagnosis not present

## 2022-12-26 NOTE — Progress Notes (Unsigned)
I,Emile Kyllo S Nachum Derossett,acting as a Education administrator for Lavon Paganini, MD.,have documented all relevant documentation on the behalf of Lavon Paganini, MD,as directed by  Lavon Paganini, MD while in the presence of Lavon Paganini, MD.    Complete physical exam   Patient: Jonathan Fields   DOB: 09/09/1956   67 y.o. Male  MRN: [id number] Visit Date: 12/29/2022  Today's healthcare provider: Lavon Paganini, MD   No chief complaint on file.  Subjective    Jonathan Fields is a 67 y.o. male who presents today for a complete physical exam.  He reports consuming a {diet types:17450} diet. {Exercise:19826} He generally feels {well/fairly well/poorly:18703}. He reports sleeping {well/fairly well/poorly:18703}. He {does/does not:200015} have additional problems to discuss today.  HPI  ***  Past Medical History:  Diagnosis Date   BPH (benign prostatic hyperplasia)    Bronchitis    Complication of anesthesia    hard to wake up   DM (diabetes mellitus), type 2 ([place])    DVT (deep venous thrombosis) ([place])    post op   ED (erectile dysfunction)    Erythrocytosis 07/04/2020   GERD (gastroesophageal reflux disease)    Hyperlipemia    Hypertension    Hypogonadism in male    Nocturia    Right ankle instability    Sleep apnea    TB lung, latent    Urinary frequency    Past Surgical History:  Procedure Laterality Date   ANKLE ARTHROSCOPY Right 11/13/2021   Procedure: ANKLE ARTHROSCOPY Arthroscopic Irrigation;  Surgeon: [name], DPM;  Location: [facility] ORS;  Service: Podiatry;  Laterality: Right;   ANKLE SURGERY Left    torn ligament   CARPAL TUNNEL RELEASE Right 08/2019   COLONOSCOPY  2009   COLONOSCOPY WITH PROPOFOL N/A 01/23/2022   Procedure: COLONOSCOPY WITH PROPOFOL;  Surgeon: [name], MD;  Location: [facility] ENDOSCOPY;  Service: Endoscopy;  Laterality: N/A;   IRRIGATION AND DEBRIDEMENT ABSCESS Right 11/13/2021   Procedure: IRRIGATION AND DEBRIDEMENT ABSCESS;  Surgeon:  [name], DPM;  Location: [facility] ORS;  Service: Podiatry;  Laterality: Right;   IRRIGATION AND DEBRIDEMENT FOOT Right 11/16/2021   Procedure: IRRIGATION AND DEBRIDEMENT ANKLE;  Surgeon: Edrick Kins, DPM;  Location: [facility] ORS;  Service: Podiatry;  Laterality: Right;   KNEE ARTHROSCOPY Left    right ankle ruptured tendon surgery  2008   TRANSURETHRAL RESECTION OF PROSTATE N/A 02/19/2018   Procedure: TRANSURETHRAL RESECTION OF THE PROSTATE (TURP);  Surgeon: [name], MD;  Location: [facility] ORS;  Service: Urology;  Laterality: N/A;   Social History   Socioeconomic History   Marital status: Married    Spouse name: Not on file   Number of children: 0   Years of education: Not on file   Highest education level: Not on file  Occupational History   Not on file  Tobacco Use   Smoking status: Never   Smokeless tobacco: Current    Types: Snuff   Tobacco comments:    tobaco pouches that produce juice  Vaping Use   Vaping Use: Never used  Substance and Sexual Activity   Alcohol use: Yes    Comment: COUPLE OF DRINKS EVERY WEEK   Drug use: No   Sexual activity: Yes    Partners: Female    Birth control/protection: None  Other Topics Concern   Not on file  Social History Narrative   Not on file   Social Determinants of Health   Financial Resource Strain: Not on file  Food Insecurity: Not on file  Transportation Needs: Not on file  Physical Activity: Not on file  Stress: Not on file  Social Connections: Not on file  Intimate Partner Violence: Not on file   Family Status  Relation Name Status   Father  Deceased   Mother  Alive   Neg Hx  (Not Specified)   Family History  Problem Relation Age of Onset   Heart disease Father    Hypertension Father    Prostate cancer Father    Heart disease Mother    Breast cancer Mother    Colon cancer Neg Hx    Allergies  Allergen Reactions   Penicillins Hives    Has patient had a PCN reaction causing immediate rash,  facial/tongue/throat swelling, SOB or lightheadedness with hypotension: yes Has patient had a PCN reaction causing severe rash involving mucus membranes or skin necrosis: no Has patient had a PCN reaction that required hospitalization: no Has patient had a PCN reaction occurring within the last 10 years: no If all of the above answers are "NO", then may proceed with Cephalosporin use.     Patient Care Team: Virginia Crews, MD as PCP - General (Family Medicine) Earlie Server, MD as Consulting Physician (Oncology)   Medications: Outpatient Medications Prior to Visit  Medication Sig   alfuzosin (UROXATRAL) 10 MG 24 hr tablet Take 1 tablet (10 mg total) by mouth daily with breakfast.   atorvastatin (LIPITOR) 80 MG tablet Take 0.5 tablets (40 mg total) by mouth daily. (Patient not taking: Reported on 07/24/2022)   dapagliflozin propanediol (FARXIGA) 10 MG TABS tablet Take 1 tablet (10 mg total) by mouth daily.   fluticasone (FLONASE) 50 MCG/ACT nasal spray 1-2 sprays daily as needed.   gabapentin (NEURONTIN) 300 MG capsule TAKE 1 CAPSULE(300 MG) BY MOUTH AT BEDTIME   lisinopril (ZESTRIL) 40 MG tablet Take 1 tablet (40 mg total) by mouth daily.   meloxicam (MOBIC) 15 MG tablet Take 15 mg by mouth daily.   metFORMIN (GLUCOPHAGE-XR) 500 MG 24 hr tablet Take 2 tablets (1,000 mg total) by mouth 2 (two) times daily with a meal. Please schedule office visit before any future refill.   MYRBETRIQ 50 MG TB24 tablet Take 1 tablet (50 mg total) by mouth daily.   NEEDLE, DISP, 21 G (BD SAFETYGLIDE SHIELDED NEEDLE) 21G X 1-1/2" MISC Use 21guage needle to inject Testosterone Cypionate 7m.   omeprazole (PRILOSEC) 20 MG capsule Take 20 mg by mouth daily.   Protein POWD Take 1 Scoop by mouth daily.   testosterone cypionate (DEPOTESTOSTERONE CYPIONATE) 200 MG/ML injection INJECT 0.5 MLS INTRAMUSCULARLY EVERY WEEK   No facility-administered medications prior to visit.    Review of Systems  All other systems  reviewed and are negative.   Last CBC Lab Results  Component Value Date   WBC 5.0 07/24/2022   HGB 15.3 07/24/2022   HCT 52.0 (H) 08/12/2022   MCV 80.0 07/24/2022   MCH 25.7 (L) 07/24/2022   RDW 15.5 07/24/2022   PLT 215 025/95/6387  Last metabolic panel Lab Results  Component Value Date   GLUCOSE 164 (H) 06/02/2022   NA 142 06/02/2022   K 4.2 06/02/2022   CL 105 06/02/2022   CO2 19 (L) 06/02/2022   BUN 13 06/02/2022   CREATININE 0.94 06/02/2022   EGFR 89 06/02/2022   CALCIUM 9.4 06/02/2022   PROT 6.6 06/02/2022   ALBUMIN 4.2 06/02/2022   LABGLOB 2.4 06/02/2022   AGRATIO 1.8 06/02/2022  BILITOT 0.7 06/02/2022   ALKPHOS 80 06/02/2022   AST 18 06/02/2022   ALT 19 06/02/2022   ANIONGAP 6 11/18/2021   Last lipids Lab Results  Component Value Date   CHOL 198 06/02/2022   HDL 34 (L) 06/02/2022   LDLCALC 107 (H) 06/02/2022   TRIG 330 (H) 06/02/2022   CHOLHDL 5.8 (H) 06/02/2022   Last hemoglobin A1c Lab Results  Component Value Date   HGBA1C 9.4 (A) 12/22/2022   Last thyroid functions Lab Results  Component Value Date   TSH 2.280 04/01/2021      Objective    There were no vitals taken for this visit. BP Readings from Last 3 Encounters:  12/22/22 110/70  07/24/22 103/84  06/09/22 (!) 143/85   Wt Readings from Last 3 Encounters:  12/22/22 232 lb (105.2 kg)  07/24/22 230 lb 6.4 oz (104.5 kg)  06/09/22 223 lb 9.6 oz (101.4 kg)       Physical Exam  ***  Last depression screening scores    12/22/2022    3:44 PM 10/31/2021    3:11 PM 04/01/2021    4:00 PM  PHQ 2/9 Scores  PHQ - 2 Score 0 1 0  PHQ- 9 Score '1 10 5   '$ Last fall risk screening    12/22/2022    3:44 PM  [place] in the past year? 0  Number falls in past yr: 0  Injury with Fall? 0  Risk for fall due to : No Fall Risks  Follow up Falls evaluation completed   Last Audit-C alcohol use screening    12/22/2022    3:44 PM  Alcohol Use Disorder Test (AUDIT)  1. How often do  you have a drink containing alcohol? 0  2. How many drinks containing alcohol do you have on a typical day when you are drinking? 0  3. How often do you have six or more drinks on one occasion? 0  AUDIT-C Score 0   A score of 3 or more in women, and 4 or more in men indicates increased risk for alcohol abuse, EXCEPT if all of the points are from question 1   No results found for any visits on 12/29/22.  Assessment & Plan    Routine Health Maintenance and Physical Exam  Exercise Activities and Dietary recommendations  Goals   None     Immunization History  Administered Date(s) Administered   Fluad Quad(high Dose 65+) 10/31/2021   Influenza,inj,Quad PF,6+ Mos 09/09/2019   Influenza-Unspecified 09/14/2020   PFIZER(Purple Top)SARS-COV-2 Vaccination 03/14/2020, 04/04/2020, 01/02/2021   Pneumococcal Conjugate-13 01/20/2016, 09/28/2020   Pneumococcal Polysaccharide-23 10/31/2021   Tdap 02/26/2016   Zoster Recombinat (Shingrix) 06/05/2017    Health Maintenance  Topic Date Due   Zoster Vaccines- Shingrix (2 of 2) 07/31/2017   INFLUENZA VACCINE  07/01/2022   COVID-19 Vaccine (4 - 2023-24 season) 08/01/2022   FOOT EXAM  10/31/2022   OPHTHALMOLOGY EXAM  01/02/2023   Diabetic kidney evaluation - Urine ACR  02/26/2023   Diabetic kidney evaluation - eGFR measurement  06/03/2023   HEMOGLOBIN A1C  06/22/2023   DTaP/Tdap/Td (2 - Td or Tdap) 02/25/2026   COLONOSCOPY (Pts 45-57yr Insurance coverage will need to be confirmed)  01/23/2027   Pneumonia Vaccine 67 Years old  Completed   Hepatitis C Screening  Completed   HPV VACCINES  Aged Out    Discussed health benefits of physical activity, and encouraged him to engage in regular exercise appropriate for his age  and condition.  ***  No follow-ups on file.     {provider attestation***:1}   Lavon Paganini, MD  [facility] [phone] (phone) [phone] (fax)  [place]

## 2022-12-27 LAB — COMPREHENSIVE METABOLIC PANEL
ALT: 24 IU/L (ref 0–44)
AST: 18 IU/L (ref 0–40)
Albumin/Globulin Ratio: 2 (ref 1.2–2.2)
Albumin: 4.3 g/dL (ref 3.9–4.9)
Alkaline Phosphatase: 89 IU/L (ref 44–121)
BUN/Creatinine Ratio: 16 (ref 10–24)
BUN: 16 mg/dL (ref 8–27)
Bilirubin Total: 1.1 mg/dL (ref 0.0–1.2)
CO2: 21 mmol/L (ref 20–29)
Calcium: 9.1 mg/dL (ref 8.6–10.2)
Chloride: 99 mmol/L (ref 96–106)
Creatinine, Ser: 1.01 mg/dL (ref 0.76–1.27)
Globulin, Total: 2.1 g/dL (ref 1.5–4.5)
Glucose: 180 mg/dL — ABNORMAL HIGH (ref 70–99)
Potassium: 4.2 mmol/L (ref 3.5–5.2)
Sodium: 139 mmol/L (ref 134–144)
Total Protein: 6.4 g/dL (ref 6.0–8.5)
eGFR: 81 mL/min/{1.73_m2} (ref >59)

## 2022-12-27 LAB — LIPID PANEL
Chol/HDL Ratio: 5.4 ratio — ABNORMAL HIGH (ref 0.0–5.0)
Cholesterol, Total: 206 mg/dL — ABNORMAL HIGH (ref 100–199)
HDL: 38 mg/dL — ABNORMAL LOW (ref >39)
LDL Chol Calc (NIH): 130 mg/dL — ABNORMAL HIGH (ref 0–99)
Triglycerides: 210 mg/dL — ABNORMAL HIGH (ref 0–149)
VLDL Cholesterol Cal: 38 mg/dL (ref 5–40)

## 2022-12-29 ENCOUNTER — Ambulatory Visit (INDEPENDENT_AMBULATORY_CARE_PROVIDER_SITE_OTHER): Payer: BC Managed Care – PPO | Admitting: Family Medicine

## 2022-12-29 ENCOUNTER — Encounter: Payer: Self-pay | Admitting: Family Medicine

## 2022-12-29 VITALS — BP 122/84 | HR 99

## 2022-12-29 DIAGNOSIS — E1169 Type 2 diabetes mellitus with other specified complication: Secondary | ICD-10-CM

## 2022-12-29 DIAGNOSIS — R3589 Other polyuria: Secondary | ICD-10-CM | POA: Diagnosis not present

## 2022-12-29 DIAGNOSIS — R350 Frequency of micturition: Secondary | ICD-10-CM

## 2022-12-29 DIAGNOSIS — L84 Corns and callosities: Secondary | ICD-10-CM | POA: Insufficient documentation

## 2022-12-29 DIAGNOSIS — Z23 Encounter for immunization: Secondary | ICD-10-CM | POA: Diagnosis not present

## 2022-12-29 DIAGNOSIS — Z0000 Encounter for general adult medical examination without abnormal findings: Secondary | ICD-10-CM

## 2022-12-29 LAB — POCT URINALYSIS DIPSTICK
Bilirubin, UA: NEGATIVE
Blood, UA: NEGATIVE
Glucose, UA: POSITIVE — AB
Ketones, UA: NEGATIVE
Leukocytes, UA: NEGATIVE
Nitrite, UA: NEGATIVE
Protein, UA: POSITIVE — AB
Spec Grav, UA: 1.015 (ref 1.010–1.025)
Urobilinogen, UA: 0.2 E.U./dL
pH, UA: 5 (ref 5.0–8.0)

## 2022-12-29 NOTE — Addendum Note (Signed)
Addended by: Barnie Mort on: 12/29/2022 04:47 PM   Modules accepted: Orders

## 2022-12-29 NOTE — Assessment & Plan Note (Signed)
UACR today Discussed role of hyperglycemia in polyuria and necessity of decreasing intake of sugary beverages

## 2022-12-29 NOTE — Assessment & Plan Note (Signed)
New problem Related to inversion of R foot and rubbing of shoe in that area Callus has split and there is pain associated No infection currently Keep area clean and dry Keep opening covered during the day, but okay to sleep with it uncovered at night if wanted Advised to schedule a sooner follow-up with podiatry

## 2022-12-31 LAB — MICROALBUMIN / CREATININE URINE RATIO
Creatinine, Urine: 74.6 mg/dL
Microalb/Creat Ratio: 29 mg/g creat (ref 0–29)
Microalbumin, Urine: 21.9 ug/mL

## 2023-01-01 ENCOUNTER — Ambulatory Visit (INDEPENDENT_AMBULATORY_CARE_PROVIDER_SITE_OTHER): Payer: BC Managed Care – PPO | Admitting: Podiatry

## 2023-01-01 DIAGNOSIS — L853 Xerosis cutis: Secondary | ICD-10-CM

## 2023-01-01 NOTE — Progress Notes (Unsigned)
Right lateral foot dry split callus at the fifth metatarsal base cavovarus foot type.  Dermabond.

## 2023-01-07 ENCOUNTER — Other Ambulatory Visit: Payer: Self-pay | Admitting: Family Medicine

## 2023-01-08 LAB — HM DIABETES EYE EXAM

## 2023-01-13 ENCOUNTER — Ambulatory Visit: Payer: BC Managed Care – PPO | Admitting: Podiatry

## 2023-01-13 ENCOUNTER — Ambulatory Visit (INDEPENDENT_AMBULATORY_CARE_PROVIDER_SITE_OTHER): Payer: BC Managed Care – PPO

## 2023-01-13 DIAGNOSIS — E119 Type 2 diabetes mellitus without complications: Secondary | ICD-10-CM

## 2023-01-13 DIAGNOSIS — M25371 Other instability, right ankle: Secondary | ICD-10-CM

## 2023-01-13 DIAGNOSIS — L97512 Non-pressure chronic ulcer of other part of right foot with fat layer exposed: Secondary | ICD-10-CM

## 2023-01-13 DIAGNOSIS — M79671 Pain in right foot: Secondary | ICD-10-CM

## 2023-01-13 NOTE — Progress Notes (Signed)
Chief Complaint  Patient presents with   Foot Pain    Ulcer on the lateral side of the right foot , X-Rays taken today    HPI: 67 y.o. male presenting today for new complaint of pain and tenderness to the lateral aspect of the right foot secondary to an ulcer/wound that has developed.  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  He has noticed the ulcer developed over the past month.  He was seen here in the office on 01/01/2023 by another physician.  He presents for further treatment and evaluation  Past Medical History:  Diagnosis Date   BPH (benign prostatic hyperplasia)    Bronchitis    Complication of anesthesia    hard to wake up   DM (diabetes mellitus), type 2 ([place])    DVT (deep venous thrombosis) ([place])    post op   ED (erectile dysfunction)    Erythrocytosis 07/04/2020   GERD (gastroesophageal reflux disease)    Hyperlipemia    Hypertension    Hypogonadism in male    Nocturia    Right ankle instability    Sleep apnea    TB lung, latent    Urinary frequency     Past Surgical History:  Procedure Laterality Date   ANKLE ARTHROSCOPY Right 11/13/2021   Procedure: ANKLE ARTHROSCOPY Arthroscopic Irrigation;  Surgeon: [name], DPM;  Location: [facility] ORS;  Service: Podiatry;  Laterality: Right;   ANKLE SURGERY Left    torn ligament   CARPAL TUNNEL RELEASE Right 08/2019   COLONOSCOPY  2009   COLONOSCOPY WITH PROPOFOL N/A 01/23/2022   Procedure: COLONOSCOPY WITH PROPOFOL;  Surgeon: [name], MD;  Location[facility] END[facility]COPY;  Service: Endoscopy;  Laterality: N/A;   IRRIGATION AND DEBRIDEMENT ABSCESS Right 11/13/2021   Procedure: IRRIGATION AND DEBRIDEMENT ABSCESS;  Surgeon: [name], DPM;  Location: [facility] ORS;  Service: Podiatry;  Laterality: Right;   IRRIGATION AND DEBRIDEMENT FOOT Right 11/16/2021   Procedure: IRRIGATION AND DEBRIDEMENT ANKLE;  Surgeon: Edrick Kins, DPM;  Location: [facility] ORS;  Service:  Podiatry;  Laterality: Right;   KNEE ARTHROSCOPY Left    right ankle ruptured tendon surgery  2008   TRANSURETHRAL RESECTION OF PROSTATE N/A 02/19/2018   Procedure: TRANSURETHRAL RESECTION OF THE PROSTATE (TURP);  Surgeon: [name], MD;  Location: [facility] ORS;  Service: Urology;  Laterality: N/A;    Allergies  Allergen Reactions   Penicillins Hives    Has patient had a PCN reaction causing immediate rash, facial/tongue/throat swelling, SOB or lightheadedness with hypotension: yes Has patient had a PCN reaction causing severe rash involving mucus membranes or skin necrosis: no Has patient had a PCN reaction that required hospitalization: no Has patient had a PCN reaction occurring within the last 10 years: no If all of the above answers are "NO", then may proceed with Cephalosporin use.      Physical Exam: General: The patient is alert and oriented x3 in no acute distress.  Dermatology: Skin is warm, dry and supple bilateral lower extremities. Negative for open lesions or macerations.  Vascular: Palpable pedal pulses bilaterally. Capillary refill within normal limits.  Negative for any significant edema or erythema  Neurological: Light touch and protective threshold grossly intact  Musculoskeletal Exam: Cavus type deformity noted with rear foot varus and lateral column loading with weightbearing  Radiographic Exam RT foot 01/13/2023:  Cavovarus type foot deformity noted.  Orthopedic hardware noted along the first metatarsal and  calcaneus.  No acute fractures identified.  No osseous erosions that would be concerning for osteomyelitis.  Assessment: 1.  Ulcer fifth metatarsal tubercle right foot with fat layer exposed 2.  Ankle joint instability/cavovarus foot type right 3.  123456; uncomplicated  Plan of Care:  1. Patient evaluated. X-Rays reviewed.  Comprehensive diabetic foot exam performed today 2.  Medically necessary excisional debridement including subcutaneous tissue  was performed using a tissue nipper.  Excisional debridement of the necrotic nonviable tissue down to healthier bleeding viable tissue was performed with postdebridement measurement same as pre- 3.  Recommend triple antibiotic ointment and a Band-Aid and cushion to offload pressure from shoes 4.  Return to clinic 3 weeks       Edrick Kins, DPM [facility]  Dr. Edrick Kins, DPM    [address], [place] [address]                Office [phone]  Fax [phone]

## 2023-01-26 ENCOUNTER — Encounter: Payer: Self-pay | Admitting: Oncology

## 2023-01-26 ENCOUNTER — Inpatient Hospital Stay (HOSPITAL_BASED_OUTPATIENT_CLINIC_OR_DEPARTMENT_OTHER): Payer: BC Managed Care – PPO | Admitting: Oncology

## 2023-01-26 ENCOUNTER — Inpatient Hospital Stay: Payer: BC Managed Care – PPO

## 2023-01-26 ENCOUNTER — Inpatient Hospital Stay: Payer: BC Managed Care – PPO | Attending: Oncology

## 2023-01-26 VITALS — BP 139/91 | HR 92 | Temp 97.1°F | Resp 18 | Wt 228.4 lb

## 2023-01-26 DIAGNOSIS — Z79899 Other long term (current) drug therapy: Secondary | ICD-10-CM | POA: Diagnosis not present

## 2023-01-26 DIAGNOSIS — D751 Secondary polycythemia: Secondary | ICD-10-CM

## 2023-01-26 DIAGNOSIS — Z79890 Hormone replacement therapy (postmenopausal): Secondary | ICD-10-CM

## 2023-01-26 LAB — CBC WITH DIFFERENTIAL/PLATELET
Abs Immature Granulocytes: 0.05 10*3/uL (ref 0.00–0.07)
Basophils Absolute: 0.1 10*3/uL (ref 0.0–0.1)
Basophils Relative: 1 %
Eosinophils Absolute: 0.1 10*3/uL (ref 0.0–0.5)
Eosinophils Relative: 2 %
HCT: 53.9 % — ABNORMAL HIGH (ref 39.0–52.0)
Hemoglobin: 17.4 g/dL — ABNORMAL HIGH (ref 13.0–17.0)
Immature Granulocytes: 1 %
Lymphocytes Relative: 24 %
Lymphs Abs: 1.4 10*3/uL (ref 0.7–4.0)
MCH: 25.7 pg — ABNORMAL LOW (ref 26.0–34.0)
MCHC: 32.3 g/dL (ref 30.0–36.0)
MCV: 79.6 fL — ABNORMAL LOW (ref 80.0–100.0)
Monocytes Absolute: 0.8 10*3/uL (ref 0.1–1.0)
Monocytes Relative: 13 %
Neutro Abs: 3.6 10*3/uL (ref 1.7–7.7)
Neutrophils Relative %: 59 %
Platelets: 219 10*3/uL (ref 150–400)
RBC: 6.77 MIL/uL — ABNORMAL HIGH (ref 4.22–5.81)
RDW: 15.6 % — ABNORMAL HIGH (ref 11.5–15.5)
WBC: 6 10*3/uL (ref 4.0–10.5)
nRBC: 0 % (ref 0.0–0.2)

## 2023-01-26 NOTE — Progress Notes (Signed)
Patient tolatered phlebotomy well today, performed in LAC using 20g angiocath. 500 cc removed as ordered. HCT 53.9 today. No concerns voiced. Snack and po fluids offered. Stable at discharge. AVS given.

## 2023-01-26 NOTE — Progress Notes (Signed)
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 Labs are reviewed and discussed with patient. Stable hemoglobin 17.4, Hct 53.  Proceed with phlebotomy. Follow-up with urology for testosterone replacement management.   Orders Placed This Encounter  Procedures   CBC with Differential ([place] Only)    Standing Status:   Future    Standing Expiration Date:   01/27/2024   CBC with Differential ([place] Only)    Standing Status:   Future    Standing Expiration Date:   01/27/2024   Hemoglobin and Hematocrit ([facility] Only)    Standing Status:   Future    Standing Expiration Date:   01/27/2024   Sample to Blood Bank    Standing Status:   Future    Standing Expiration Date:   01/27/2024   Follow up  3 months lab H&H +/- Phleb 6 months lab MD +/- Phleb cbc   All questions were answered. The patient knows to call the clinic with any problems, questions or concerns.  Earlie Server, MD, PhD [facility] Hematology Oncology 01/26/2023   HISTORY OF PRESENTING ILLNESS:  Jonathan Fields is a 67 y.o. male who was seen in consultation at the request of Brita Romp, Dionne Bucy, MD for evaluation of polycytosis/erythrocytosis Reviewed patient's recent lab work 03/14/2020, hematocrit 55, 09/13/2019 hematocrit 51.8, 07/22/2019, hematocrit 49.1.  Hemoglobin 17.2.   Patient is on testosterone replacement.  Currently Dr. Bernardo Heater hold off treatments due to erythrocytosis. Patient was is Dr. Dennis Bast to hematology oncology for evaluation and management. Associated signs or symptoms: Patient mention about 15 pounds of unintentional weight loss recently during the past few weeks.  His appetite is unchanged.  He feels that he has eaten similar amount of food recently.  Denies any  increase of activity level.  Some sweating for the past 2 days.  Context:  Smoking history: Denies. Testosterone supplements: History of blood clots: Previously he has an episode of provoked thrombosis after surgery. Daytime somnolence: Denies Family history of polycythemia: Denies  Patient underwent stabilization of right foot complicated by wound dehiscence, septic arthritis of the right ankle, acute osteomyelitis.  Patient was taken to the OR for washout and debridement in December 2022.  Finished antibiotic course.  03/17/2022 for Sclerotherapy for symptomatic varicose veins of the right lower extremity.  INTERVAL HISTORY Jonathan Fields is a 67 y.o. male who has above history reviewed by me today presents for follow up visit for management of secondary erythrocytosis due to testosterone use Patient is on testosterone replacement therapy, no recent change of his dose.  Today he feels well. Occasionally he has experienced lightheaded and double vision. Symptoms spontaneously resolve. Denies any arthralgia, fever, chills.  Review of Systems  Constitutional:  Negative for appetite change, chills, fatigue, fever and unexpected weight change.  HENT:   Negative for hearing loss and voice change.   Eyes:  Negative for eye problems and icterus.  Respiratory:  Negative for chest tightness, cough and shortness of breath.   Cardiovascular:  Negative for chest pain and leg swelling.  Gastrointestinal:  Negative for abdominal distention and abdominal pain.  Endocrine: Negative for hot flashes.  Genitourinary:  Negative for difficulty urinating, dysuria and frequency.   Musculoskeletal:  Negative for arthralgias.  Skin:  Negative for itching and rash.  Neurological:  Negative for light-headedness and numbness.  Hematological:  Negative for adenopathy. Does not bruise/bleed easily.  Psychiatric/Behavioral:  Negative for confusion.     MEDICAL HISTORY:  Past Medical History:  Diagnosis Date    BPH (benign prostatic hyperplasia)    Bronchitis    Complication of anesthesia    hard to wake up   DM (diabetes mellitus), type 2 ([place])    DVT (deep venous thrombosis) ([place])    post op   ED (erectile dysfunction)    Erythrocytosis 07/04/2020   GERD (gastroesophageal reflux disease)    Hyperlipemia    Hypertension    Hypogonadism in male    Nocturia    Right ankle instability    Sleep apnea    TB lung, latent    Urinary frequency     SURGICAL HISTORY: Past Surgical History:  Procedure Laterality Date   ANKLE ARTHROSCOPY Right 11/13/2021   Procedure: ANKLE ARTHROSCOPY Arthroscopic Irrigation;  Surgeon: [name], DPM;  Location: [facility] ORS;  Service: Podiatry;  Laterality: Right;   ANKLE SURGERY Left    torn ligament   CARPAL TUNNEL RELEASE Right 08/2019   COLONOSCOPY  2009   COLONOSCOPY WITH PROPOFOL N/A 01/23/2022   Procedure: COLONOSCOPY WITH PROPOFOL;  Surgeon: [name], MD;  Location: [facility] ENDOSCOPY;  Service: Endoscopy;  Laterality: N/A;   IRRIGATION AND DEBRIDEMENT ABSCESS Right 11/13/2021   Procedure: IRRIGATION AND DEBRIDEMENT ABSCESS;  Surgeon: [name], DPM;  Location: [facility] ORS;  Service: Podiatry;  Laterality: Right;   IRRIGATION AND DEBRIDEMENT FOOT Right 11/16/2021   Procedure: IRRIGATION AND DEBRIDEMENT ANKLE;  Surgeon: Edrick Kins, DPM;  Location: [facility] ORS;  Service: Podiatry;  Laterality: Right;   KNEE ARTHROSCOPY Left    right ankle ruptured tendon surgery  2008   TRANSURETHRAL RESECTION OF PROSTATE N/A 02/19/2018   Procedure: TRANSURETHRAL RESECTION OF THE PROSTATE (TURP);  Surgeon: [name], MD;  Location: [facility] ORS;  Service: Urology;  Laterality: N/A;    SOCIAL HISTORY: Social History   Socioeconomic History   Marital status: Married    Spouse name: Not on file   Number of children: 0   Years of education: Not on file   Highest education level: Not on file  Occupational History   Not on file  Tobacco Use   Smoking  status: Never   Smokeless tobacco: Current    Types: Snuff   Tobacco comments:    tobaco pouches that produce juice  Vaping Use   Vaping Use: Never used  Substance and Sexual Activity   Alcohol use: Yes    Comment: COUPLE OF DRINKS EVERY WEEK   Drug use: No   Sexual activity: Yes    Partners: Female    Birth control/protection: None  Other Topics Concern   Not on file  Social History Narrative   Not on file   Social Determinants of Health   Financial Resource Strain: Not on file  Food Insecurity: Not on file  Transportation Needs: Not on file  Physical Activity: Not on file  Stress: Not on file  Social Connections: Not on file  Intimate Partner Violence: Not on file    FAMILY HISTORY: Family History  Problem Relation Age of Onset   Heart disease Father    Hypertension Father    Prostate cancer Father    Heart disease Mother    Breast cancer Mother    Colon cancer Neg Hx     ALLERGIES:  is allergic to penicillins.  MEDICATIONS:  Current Outpatient Medications  Medication Sig  Dispense Refill   alfuzosin (UROXATRAL) 10 MG 24 hr tablet Take 1 tablet (10 mg total) by mouth daily with breakfast. 30 tablet 11   celecoxib (CELEBREX) 200 MG capsule Take 200 mg by mouth 2 (two) times daily as needed.     dapagliflozin propanediol (FARXIGA) 10 MG TABS tablet Take 1 tablet (10 mg total) by mouth daily. 90 tablet 1   fluticasone (FLONASE) 50 MCG/ACT nasal spray 1-2 sprays daily as needed.  0   gabapentin (NEURONTIN) 300 MG capsule TAKE 1 CAPSULE(300 MG) BY MOUTH AT BEDTIME 90 capsule 1   lisinopril (ZESTRIL) 40 MG tablet Take 1 tablet (40 mg total) by mouth daily. 90 tablet 0   meloxicam (MOBIC) 15 MG tablet Take 15 mg by mouth daily.     metFORMIN (GLUCOPHAGE-XR) 500 MG 24 hr tablet TAKE 2 TABLETS(1000 MG) BY MOUTH TWICE DAILY WITH A MEAL 360 tablet 1   methocarbamol (ROBAXIN) 500 MG tablet Take 500 mg by mouth 2 (two) times daily.     MYRBETRIQ 50 MG TB24 tablet Take 1  tablet (50 mg total) by mouth daily. 30 tablet 11   NEEDLE, DISP, 21 G (BD SAFETYGLIDE SHIELDED NEEDLE) 21G X 1-1/2" MISC Use 21guage needle to inject Testosterone Cypionate 59m. 25 each 0   omeprazole (PRILOSEC) 20 MG capsule Take 20 mg by mouth daily.     Protein POWD Take 1 Scoop by mouth daily.     testosterone cypionate (DEPOTESTOSTERONE CYPIONATE) 200 MG/ML injection INJECT 0.5 MLS INTRAMUSCULARLY EVERY WEEK 10 mL 0   No current facility-administered medications for this visit.     PHYSICAL EXAMINATION: ECOG PERFORMANCE STATUS: 0 - Asymptomatic Vitals:   01/26/23 1353  BP: (!) 139/91  Pulse: 92  Resp: 18  Temp: (!) 97.1 F (36.2 C)   Filed Weights   01/26/23 1353  Weight: 228 lb 6.4 oz (103.6 kg)    Physical Exam Constitutional:      General: He is not in acute distress. HENT:     Head: Normocephalic and atraumatic.  Eyes:     General: No scleral icterus. Cardiovascular:     Rate and Rhythm: Normal rate.  Pulmonary:     Effort: Pulmonary effort is normal. No respiratory distress.  Abdominal:     General: There is no distension.     Palpations: Abdomen is soft.  Musculoskeletal:        General: No deformity. Normal range of motion.     Cervical back: Normal range of motion and neck supple.  Skin:    General: Skin is warm and dry.  Neurological:     Mental Status: He is alert and oriented to person, place, and time. Mental status is at baseline.     Cranial Nerves: No cranial nerve deficit.     Coordination: Coordination normal.  Psychiatric:        Mood and Affect: Mood normal.     RADIOGRAPHIC STUDIES: I have personally reviewed the radiological images as listed and agreed with the findings in the report. No results found.   LABORATORY DATA:  I have reviewed the data as listed    Latest Ref Rng & Units 01/26/2023    1:22 PM 08/12/2022    8:11 AM 07/24/2022    1:04 PM  CBC  WBC 4.0 - 10.5 K/uL 6.0   5.0   Hemoglobin 13.0 - 17.0 g/dL 17.4   15.3    Hematocrit 39.0 - 52.0 % 53.9  52.0  47.7   Platelets  150 - 400 K/uL 219   215       Latest Ref Rng & Units 12/26/2022    8:02 AM 06/02/2022    3:44 PM 11/18/2021    3:19 AM  CMP  Glucose 70 - 99 mg/dL 180  164  175   BUN 8 - 27 mg/dL '16  13  17   '$ Creatinine 0.76 - 1.27 mg/dL 1.01  0.94  0.86   Sodium 134 - 144 mmol/L 139  142  133   Potassium 3.5 - 5.2 mmol/L 4.2  4.2  4.0   Chloride 96 - 106 mmol/L 99  105  101   CO2 20 - 29 mmol/L '21  19  26   '$ Calcium 8.6 - 10.2 mg/dL 9.1  9.4  8.4   Total Protein 6.0 - 8.5 g/dL 6.4  6.6    Total Bilirubin 0.0 - 1.2 mg/dL 1.1  0.7    Alkaline Phos 44 - 121 IU/L 89  80    AST 0 - 40 IU/L 18  18    ALT 0 - 44 IU/L 24  19

## 2023-01-26 NOTE — Assessment & Plan Note (Signed)
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 Labs are reviewed and discussed with patient. Stable hemoglobin 17.4, Hct 53.  Proceed with phlebotomy. Follow-up with urology for testosterone replacement management.

## 2023-01-26 NOTE — Patient Instructions (Signed)

## 2023-01-27 ENCOUNTER — Encounter: Payer: Self-pay | Admitting: Podiatry

## 2023-01-27 ENCOUNTER — Ambulatory Visit (INDEPENDENT_AMBULATORY_CARE_PROVIDER_SITE_OTHER): Payer: BC Managed Care – PPO | Admitting: Podiatry

## 2023-01-27 VITALS — BP 139/84 | HR 98

## 2023-01-27 DIAGNOSIS — E0843 Diabetes mellitus due to underlying condition with diabetic autonomic (poly)neuropathy: Secondary | ICD-10-CM | POA: Diagnosis not present

## 2023-01-27 DIAGNOSIS — L97512 Non-pressure chronic ulcer of other part of right foot with fat layer exposed: Secondary | ICD-10-CM | POA: Diagnosis not present

## 2023-01-27 NOTE — Progress Notes (Signed)
Chief Complaint  Patient presents with   Wound Check    "It's improved some but not completely."    HPI: 67 y.o. male presenting today for follow-up evaluation of an ulcer that developed to the lateral aspect of the right foot.  He does have a history of cavovarus deformity with lateral column loading to the foot.  He has noticed improvement over the past 3 weeks.  He presents for further treatment and evaluation  Past Medical History:  Diagnosis Date   BPH (benign prostatic hyperplasia)    Bronchitis    Complication of anesthesia    hard to wake up   DM (diabetes mellitus), type 2 ([place])    DVT (deep venous thrombosis) ([place])    post op   ED (erectile dysfunction)    Erythrocytosis 07/04/2020   GERD (gastroesophageal reflux disease)    Hyperlipemia    Hypertension    Hypogonadism in male    Nocturia    Right ankle instability    Sleep apnea    TB lung, latent    Urinary frequency     Past Surgical History:  Procedure Laterality Date   ANKLE ARTHROSCOPY Right 11/13/2021   Procedure: ANKLE ARTHROSCOPY Arthroscopic Irrigation;  Surgeon: [name], DPM;  Location: [facility] ORS;  Service: Podiatry;  Laterality: Right;   ANKLE SURGERY Left    torn ligament   CARPAL TUNNEL RELEASE Right 08/2019   COLONOSCOPY  2009   COLONOSCOPY WITH PROPOFOL N/A 01/23/2022   Procedure: COLONOSCOPY WITH PROPOFOL;  Surgeon: [name], MD;  Location[facility] ENDOSCOPY;  Service: Endoscopy;  Laterality: N/A;   IRRIGATION AND DEBRIDEMENT ABSCESS Right 11/13/2021   Procedure: IRRIGATION AND DEBRIDEMENT ABSCESS;  Surgeon: [name], DPM;  Location: [facility] ORS;  Service: Podiatry;  Laterality: Right;   IRRIGATION AND DEBRIDEMENT FOOT Right 11/16/2021   Procedure: IRRIGATION AND DEBRIDEMENT ANKLE;  Surgeon: Edrick Kins, DPM;  Location: [facility] ORS;  Service: Podiatry;  Laterality: Right;   KNEE ARTHROSCOPY Left    right ankle ruptured tendon surgery  2008   TRANSURETHRAL RESECTION OF PROSTATE  N/A 02/19/2018   Procedure: TRANSURETHRAL RESECTION OF THE PROSTATE (TURP);  Surgeon: [name], MD;  Location: [facility] ORS;  Service: Urology;  Laterality: N/A;    Allergies  Allergen Reactions   Penicillins Hives    Has patient had a PCN reaction causing immediate rash, facial/tongue/throat swelling, SOB or lightheadedness with hypotension: yes Has patient had a PCN reaction causing severe rash involving mucus membranes or skin necrosis: no Has patient had a PCN reaction that required hospitalization: no Has patient had a PCN reaction occurring within the last 10 years: no If all of the above answers are "NO", then may proceed with Cephalosporin use.      Physical Exam: General: The patient is alert and oriented x3 in no acute distress.  Dermatology: Skin is warm, dry and supple bilateral lower extremities.  There continues to be hyperkeratotic fissure with full-thickness ulcer to the lateral aspect of the right foot around the fifth metatarsal tubercle area.  Vascular: Palpable pedal pulses bilaterally. Capillary refill within normal limits.  Negative for any significant edema or erythema  Neurological: Light touch and protective threshold grossly intact  Musculoskeletal Exam: Cavus type deformity noted with rear foot varus and lateral column loading with weightbearing  Radiographic Exam RT foot 01/13/2023:  Cavovarus type foot deformity noted.  Orthopedic hardware noted along the first metatarsal and calcaneus.  No acute fractures identified.  No osseous  erosions that would be concerning for osteomyelitis.  Assessment: 1.  Ulcer fifth metatarsal tubercle right foot with fat layer exposed 2.  Ankle joint instability/cavovarus foot type right 3.  123456; uncomplicated  Plan of Care:  1. Patient evaluated.  Overall improvement 2.  Medically necessary excisional debridement including subcutaneous tissue was performed using a tissue nipper.  Excisional debridement of the  necrotic nonviable tissue down to healthier bleeding viable tissue was performed with postdebridement measurement same as pre- 3.  Continue triple antibiotic ointment and a Band-Aid and cushion to offload pressure from shoes 4.  Return to clinic 3 weeks       Edrick Kins, DPM [facility]  Dr. Edrick Kins, DPM    [address], [place] [address]                Office [phone]  Fax [phone]

## 2023-02-09 ENCOUNTER — Other Ambulatory Visit: Payer: Self-pay

## 2023-02-09 ENCOUNTER — Other Ambulatory Visit: Payer: BC Managed Care – PPO

## 2023-02-09 DIAGNOSIS — N401 Enlarged prostate with lower urinary tract symptoms: Secondary | ICD-10-CM

## 2023-02-09 DIAGNOSIS — E291 Testicular hypofunction: Secondary | ICD-10-CM | POA: Diagnosis not present

## 2023-02-09 DIAGNOSIS — M19012 Primary osteoarthritis, left shoulder: Secondary | ICD-10-CM | POA: Diagnosis not present

## 2023-02-10 LAB — TESTOSTERONE: Testosterone: 566 ng/dL (ref 264–916)

## 2023-02-10 LAB — PSA: Prostate Specific Ag, Serum: 1.8 ng/mL (ref 0.0–4.0)

## 2023-02-10 LAB — HEMATOCRIT: Hematocrit: 50.3 % (ref 37.5–51.0)

## 2023-02-13 ENCOUNTER — Encounter: Payer: Self-pay | Admitting: Urology

## 2023-02-13 ENCOUNTER — Ambulatory Visit: Payer: BC Managed Care – PPO | Admitting: Urology

## 2023-02-13 VITALS — BP 133/79 | HR 102 | Ht 74.0 in | Wt 230.0 lb

## 2023-02-13 DIAGNOSIS — E291 Testicular hypofunction: Secondary | ICD-10-CM | POA: Diagnosis not present

## 2023-02-13 DIAGNOSIS — N401 Enlarged prostate with lower urinary tract symptoms: Secondary | ICD-10-CM | POA: Diagnosis not present

## 2023-02-13 NOTE — Progress Notes (Signed)
02/13/2023 9:00 AM   Jonathan Fields 01-09-1956 [id number]  Referring provider: Virginia Crews, [place],  [place] [address]  Chief Complaint  Patient presents with   Other    Hypogonadism    Urologic history: 1.  BPH with lower urinary tract symptoms             -Prior medical management             -UDS consistent with obstruction.             -TURP by Dr. Pilar Jarvis 01/2018; 7 g, benign    2.  Hypogonadism              -On TRT  -Followed in hematology for erythrocytosis   HPI: 67 y.o. male presents for annual follow-up.  Remains on Myrbetriq and alfuzosin Stable LUTS.  Still has urine frequency but he drinks 3 L of water per day  Has nocturia x 2; has been diagnosed with sleep apnea and unable to tolerate CPAP Labs 02/09/2023: Testosterone 566; hematocrit 50.3, PSA 1.8   PMH: Past Medical History:  Diagnosis Date   BPH (benign prostatic hyperplasia)    Bronchitis    Complication of anesthesia    hard to wake up   DM (diabetes mellitus), type 2 ([place])    DVT (deep venous thrombosis) ([place])    post op   ED (erectile dysfunction)    Erythrocytosis 07/04/2020   GERD (gastroesophageal reflux disease)    Hyperlipemia    Hypertension    Hypogonadism in male    Nocturia    Right ankle instability    Sleep apnea    TB lung, latent    Urinary frequency     Surgical History: Past Surgical History:  Procedure Laterality Date   ANKLE ARTHROSCOPY Right 11/13/2021   Procedure: ANKLE ARTHROSCOPY Arthroscopic Irrigation;  Surgeon: [name], DPM;  Location: [facility] ORS;  Service: Podiatry;  Laterality: Right;   ANKLE SURGERY Left    torn ligament   CARPAL TUNNEL RELEASE Right 08/2019   COLONOSCOPY  2009   COLONOSCOPY WITH PROPOFOL N/A 01/23/2022   Procedure: COLONOSCOPY WITH PROPOFOL;  Surgeon: [name], MD;  Location: [facility] ENDOSCOPY;  Service: Endoscopy;  Laterality: N/A;   IRRIGATION AND DEBRIDEMENT ABSCESS Right 11/13/2021    Procedure: IRRIGATION AND DEBRIDEMENT ABSCESS;  Surgeon: [name], DPM;  Location: [facility] ORS;  Service: Podiatry;  Laterality: Right;   IRRIGATION AND DEBRIDEMENT FOOT Right 11/16/2021   Procedure: IRRIGATION AND DEBRIDEMENT ANKLE;  Surgeon: Edrick Kins, DPM;  Location: [facility] ORS;  Service: Podiatry;  Laterality: Right;   KNEE ARTHROSCOPY Left    right ankle ruptured tendon surgery  2008   TRANSURETHRAL RESECTION OF PROSTATE N/A 02/19/2018   Procedure: TRANSURETHRAL RESECTION OF THE PROSTATE (TURP);  Surgeon: [name], MD;  Location: [facility] ORS;  Service: Urology;  Laterality: N/A;    Home Medications:  Allergies as of 02/13/2023       Reactions   Penicillins Hives   Has patient had a PCN reaction causing immediate rash, facial/tongue/throat swelling, SOB or lightheadedness with hypotension: yes Has patient had a PCN reaction causing severe rash involving mucus membranes or skin necrosis: no Has patient had a PCN reaction that required hospitalization: no Has patient had a PCN reaction occurring within the last 10 years: no If all of the above answers are "NO", then may proceed with Cephalosporin use.  Medication List        Accurate as of February 13, 2023  9:00 AM. If you have any questions, ask your nurse or doctor.          STOP taking these medications    methocarbamol 500 MG tablet Commonly known as: ROBAXIN Stopped by: Abbie Sons, MD       TAKE these medications    alfuzosin 10 MG 24 hr tablet Commonly known as: UROXATRAL Take 1 tablet (10 mg total) by mouth daily with breakfast.   BD SafetyGlide Shielded Needle 21G X 1-1/2" Misc Generic drug: NEEDLE (DISP) 21 G Use 21guage needle to inject Testosterone Cypionate 11ml.   celecoxib 200 MG capsule Commonly known as: CELEBREX Take 200 mg by mouth 2 (two) times daily as needed.   dapagliflozin propanediol 10 MG Tabs tablet Commonly known as: FARXIGA Take 1 tablet (10 mg total) by  mouth daily.   fluticasone 50 MCG/ACT nasal spray Commonly known as: FLONASE 1-2 sprays daily as needed.   gabapentin 300 MG capsule Commonly known as: NEURONTIN TAKE 1 CAPSULE(300 MG) BY MOUTH AT BEDTIME   lisinopril 40 MG tablet Commonly known as: ZESTRIL Take 1 tablet (40 mg total) by mouth daily.   meloxicam 15 MG tablet Commonly known as: MOBIC Take 15 mg by mouth daily.   metFORMIN 500 MG 24 hr tablet Commonly known as: GLUCOPHAGE-XR TAKE 2 TABLETS(1000 MG) BY MOUTH TWICE DAILY WITH A MEAL   Myrbetriq 50 MG Tb24 tablet Generic drug: mirabegron ER Take 1 tablet (50 mg total) by mouth daily.   omeprazole 20 MG capsule Commonly known as: PRILOSEC Take 20 mg by mouth daily.   Protein Powd Take 1 Scoop by mouth daily.   testosterone cypionate 200 MG/ML injection Commonly known as: DEPOTESTOSTERONE CYPIONATE INJECT 0.5 MLS INTRAMUSCULARLY EVERY WEEK        Allergies:  Allergies  Allergen Reactions   Penicillins Hives    Has patient had a PCN reaction causing immediate rash, facial/tongue/throat swelling, SOB or lightheadedness with hypotension: yes Has patient had a PCN reaction causing severe rash involving mucus membranes or skin necrosis: no Has patient had a PCN reaction that required hospitalization: no Has patient had a PCN reaction occurring within the last 10 years: no If all of the above answers are "NO", then may proceed with Cephalosporin use.     Family History: Family History  Problem Relation Age of Onset   Heart disease Father    Hypertension Father    Prostate cancer Father    Heart disease Mother    Breast cancer Mother    Colon cancer Neg Hx     Social History:  reports that he has never smoked. His smokeless tobacco use includes snuff. He reports that he does not currently use alcohol. He reports that he does not use drugs.   Physical Exam: BP 133/79   Pulse (!) 102   Ht 6\' 2"  (1.88 m)   Wt 230 lb (104.3 kg)   BMI 29.53 kg/m    Constitutional:  Alert and oriented, No acute distress. HEENT: [place] AT Respiratory: Normal respiratory effort, no increased work of breathing. Neurologic: Grossly intact, no focal deficits, moving all 4 extremities. Psychiatric: Normal mood and affect.   Assessment & Plan:    1.  BPH with LUTS Stable; continue Myrbetriq/alfuzosin We discussed urinary frequency will be normal with his fluid intake  2.  Hypogonadism Stable labs Followed quarterly by hematology for erythrocytosis Follow-up 6 months testosterone  level and office visit 1 year for DRE, testosterone and PSA    Abbie Sons, MD  [place] [address] [place], [place] [address] [phone]

## 2023-02-17 ENCOUNTER — Ambulatory Visit: Payer: BC Managed Care – PPO | Admitting: Podiatry

## 2023-03-03 ENCOUNTER — Ambulatory Visit: Payer: BC Managed Care – PPO | Admitting: Podiatry

## 2023-03-09 ENCOUNTER — Ambulatory Visit: Payer: BC Managed Care – PPO | Admitting: Podiatry

## 2023-03-09 DIAGNOSIS — E0843 Diabetes mellitus due to underlying condition with diabetic autonomic (poly)neuropathy: Secondary | ICD-10-CM

## 2023-03-09 DIAGNOSIS — L97512 Non-pressure chronic ulcer of other part of right foot with fat layer exposed: Secondary | ICD-10-CM

## 2023-03-09 NOTE — Progress Notes (Signed)
Chief Complaint  Patient presents with   Diabetic Ulcer    Right foot diabetic ulcer, right foot, patient has a callus on the left foot     HPI: 67 y.o. male presenting today for follow-up evaluation of an ulcer that developed to the lateral aspect of the right foot.  He does have a history of cavovarus deformity with lateral column loading to the foot.  Patient continues to notice improvement.  He believes the wound has healed.  Presenting for follow-up treatment evaluation  Past Medical History:  Diagnosis Date   BPH (benign prostatic hyperplasia)    Bronchitis    Complication of anesthesia    hard to wake up   DM (diabetes mellitus), type 2 (HCC)    DVT (deep venous thrombosis) (HCC)    post op   ED (erectile dysfunction)    Erythrocytosis 07/04/2020   GERD (gastroesophageal reflux disease)    Hyperlipemia    Hypertension    Hypogonadism in male    Nocturia    Right ankle instability    Sleep apnea    TB lung, latent    Urinary frequency     Past Surgical History:  Procedure Laterality Date   ANKLE ARTHROSCOPY Right 11/13/2021   Procedure: ANKLE ARTHROSCOPY Arthroscopic Irrigation;  Surgeon: Edwin Cap, DPM;  Location: [facility];  Service: Podiatry;  Laterality: Right;   ANKLE SURGERY Left    torn ligament   CARPAL TUNNEL RELEASE Right 08/2019   COLONOSCOPY  2009   COLONOSCOPY WITH PROPOFOL N/A 01/23/2022   Procedure: COLONOSCOPY WITH PROPOFOL;  Surgeon: Midge Minium, MD;  Location: [facility];  Service: Endoscopy;  Laterality: N/A;   IRRIGATION AND DEBRIDEMENT ABSCESS Right 11/13/2021   Procedure: IRRIGATION AND DEBRIDEMENT ABSCESS;  Surgeon: Edwin Cap, DPM;  Location: [facility];  Service: Podiatry;  Laterality: Right;   IRRIGATION AND DEBRIDEMENT FOOT Right 11/16/2021   Procedure: IRRIGATION AND DEBRIDEMENT ANKLE;  Surgeon: Felecia Shelling, DPM;  Location: [facility];  Service: Podiatry;  Laterality: Right;   KNEE ARTHROSCOPY Left    right ankle ruptured  tendon surgery  2008   TRANSURETHRAL RESECTION OF PROSTATE N/A 02/19/2018   Procedure: TRANSURETHRAL RESECTION OF THE PROSTATE (TURP);  Surgeon: Hildred Laser, MD;  Location: [facility];  Service: Urology;  Laterality: N/A;    Allergies  Allergen Reactions   Penicillins Hives    Has patient had a PCN reaction causing immediate rash, facial/tongue/throat swelling, SOB or lightheadedness with hypotension: yes Has patient had a PCN reaction causing severe rash involving mucus membranes or skin necrosis: no Has patient had a PCN reaction that required hospitalization: no Has patient had a PCN reaction occurring within the last 10 years: no If all of the above answers are "NO", then may proceed with Cephalosporin use.      Physical Exam: General: The patient is alert and oriented x3 in no acute distress.  Dermatology: Skin is warm, dry and supple bilateral lower extremities.  There continues to be hyperkeratotic fissure however the ulcer has healed.  Vascular: Palpable pedal pulses bilaterally. Capillary refill within normal limits.  Negative for any significant edema or erythema  Neurological: Light touch and protective threshold grossly intact  Musculoskeletal Exam: Cavus type deformity noted with rear foot varus and lateral column loading with weightbearing  Radiographic Exam RT foot 01/13/2023:  Cavovarus type foot deformity noted.  Orthopedic hardware noted along the first metatarsal and calcaneus.  No acute fractures identified.  No osseous erosions that  would be concerning for osteomyelitis.  Assessment: 1.  Ulcer fifth metatarsal tubercle right foot with fat layer exposed 2.  Ankle joint instability/cavovarus foot type right 3.  T2DM; uncomplicated  Plan of Care:  1. Patient evaluated.  The ulcer to the foot has healed.  There is some hyperkeratotic callus tissue which was debrided today down to healthy skin 2.  Continue hightop shoes that support the ankle and foot 3.   Return to clinic routinely for routine footcare as needed       Felecia Shelling, DPM [facility]  Dr. Felecia Shelling, DPM    [address] [place], [place] [address]                Office [phone]  Fax [phone]

## 2023-03-30 ENCOUNTER — Other Ambulatory Visit: Payer: Self-pay | Admitting: Family Medicine

## 2023-03-30 DIAGNOSIS — I152 Hypertension secondary to endocrine disorders: Secondary | ICD-10-CM

## 2023-03-31 ENCOUNTER — Encounter: Payer: Self-pay | Admitting: Family Medicine

## 2023-03-31 ENCOUNTER — Ambulatory Visit: Payer: BC Managed Care – PPO | Admitting: Family Medicine

## 2023-03-31 VITALS — BP 137/89 | HR 91 | Temp 97.8°F | Resp 12 | Wt 232.0 lb

## 2023-03-31 DIAGNOSIS — Z79899 Other long term (current) drug therapy: Secondary | ICD-10-CM

## 2023-03-31 DIAGNOSIS — R5383 Other fatigue: Secondary | ICD-10-CM | POA: Diagnosis not present

## 2023-03-31 DIAGNOSIS — E1159 Type 2 diabetes mellitus with other circulatory complications: Secondary | ICD-10-CM | POA: Diagnosis not present

## 2023-03-31 DIAGNOSIS — I152 Hypertension secondary to endocrine disorders: Secondary | ICD-10-CM

## 2023-03-31 DIAGNOSIS — E1169 Type 2 diabetes mellitus with other specified complication: Secondary | ICD-10-CM | POA: Diagnosis not present

## 2023-03-31 DIAGNOSIS — E785 Hyperlipidemia, unspecified: Secondary | ICD-10-CM

## 2023-03-31 LAB — POCT GLYCOSYLATED HEMOGLOBIN (HGB A1C)
Est. average glucose Bld gHb Est-mCnc: 212
Hemoglobin A1C: 9 % — AB (ref 4.0–5.6)

## 2023-03-31 MED ORDER — ATORVASTATIN CALCIUM 40 MG PO TABS: 40.0000 mg | ORAL_TABLET | Freq: Every day | ORAL | 3 refills | Status: DC

## 2023-03-31 MED ORDER — RYBELSUS 3 MG PO TABS: 3.0000 mg | ORAL_TABLET | Freq: Every day | ORAL | 0 refills | Status: DC

## 2023-03-31 MED ORDER — RYBELSUS 7 MG PO TABS: 7.0000 mg | ORAL_TABLET | Freq: Every day | ORAL | 3 refills | Status: DC

## 2023-03-31 NOTE — Progress Notes (Signed)
I,Sulibeya S Dimas,acting as a scribe for Shirlee Latch, MD.,have documented all relevant documentation on the behalf of Shirlee Latch, MD,as directed by  Shirlee Latch, MD while in the presence of Shirlee Latch, MD.     Established patient visit   Patient: Jonathan Fields   DOB: 23-Feb-1956   67 y.o. Male  MRN: [id number] Visit Date: 03/31/2023  Today's healthcare provider: Shirlee Latch, MD   Chief Complaint  Patient presents with   Diabetes   Hypertension   Hyperlipidemia   Subjective    HPI  Diabetes Mellitus Type II, follow-up  Lab Results  Component Value Date   HGBA1C 9.0 (A) 03/31/2023   HGBA1C 9.4 (A) 12/22/2022   HGBA1C 7.6 (A) 06/02/2022   Last seen for diabetes 3 months ago.  Management since then includes continuing the same treatment. He reports excellent compliance with treatment. He is not having side effects.   Home blood sugar records:  not being checked  Episodes of hypoglycemia? No    Current insulin regiment: none Most Recent Eye Exam: needs  --------------------------------------------------------------------------------------------------- Hypertension, follow-up  BP Readings from Last 3 Encounters:  03/31/23 137/89  02/13/23 133/79  01/27/23 139/84   Wt Readings from Last 3 Encounters:  03/31/23 232 lb (105.2 kg)  02/13/23 230 lb (104.3 kg)  01/26/23 228 lb 6.4 oz (103.6 kg)     He was last seen for hypertension 3 months ago.  BP at that visit was 122/84. Management since that visit includes no changes. He reports excellent compliance with treatment. He is not having side effects.     Outside blood pressures are not being checked.  --------------------------------------------------------------------------------------------------- Lipid/Cholesterol, follow-up  Last Lipid Panel: Lab Results  Component Value Date   CHOL 206 (H) 12/26/2022   LDLCALC 130 (H) 12/26/2022   HDL 38 (L) 12/26/2022   TRIG 210  (H) 12/26/2022    He was last seen for this 3 months ago.  Management since that visit includes nochanges.  He reports poor compliance with treatment. He is not having side effects. Not sure why he stopped taking it.    Last metabolic panel Lab Results  Component Value Date   GLUCOSE 180 (H) 12/26/2022   NA 139 12/26/2022   K 4.2 12/26/2022   BUN 16 12/26/2022   CREATININE 1.01 12/26/2022   EGFR 81 12/26/2022   GFRNONAA >60 11/18/2021   CALCIUM 9.1 12/26/2022   AST 18 12/26/2022   ALT 24 12/26/2022   The 10-year ASCVD risk score (Arnett DK, et al., 2019) is: 40.4%  ---------------------------------------------------------------------------------------------------   Medications: Outpatient Medications Prior to Visit  Medication Sig   alfuzosin (UROXATRAL) 10 MG 24 hr tablet Take 1 tablet (10 mg total) by mouth daily with breakfast.   celecoxib (CELEBREX) 200 MG capsule Take 200 mg by mouth 2 (two) times daily as needed.   dapagliflozin propanediol (FARXIGA) 10 MG TABS tablet Take 1 tablet (10 mg total) by mouth daily.   fluticasone (FLONASE) 50 MCG/ACT nasal spray 1-2 sprays daily as needed.   gabapentin (NEURONTIN) 300 MG capsule TAKE 1 CAPSULE(300 MG) BY MOUTH AT BEDTIME   lisinopril (ZESTRIL) 40 MG tablet TAKE 1 TABLET(40 MG) BY MOUTH DAILY   meloxicam (MOBIC) 15 MG tablet Take 15 mg by mouth daily.   metFORMIN (GLUCOPHAGE-XR) 500 MG 24 hr tablet TAKE 2 TABLETS(1000 MG) BY MOUTH TWICE DAILY WITH A MEAL   MYRBETRIQ 50 MG TB24 tablet Take 1 tablet (50 mg total) by mouth daily.   NEEDLE, DISP,  21 G (BD SAFETYGLIDE SHIELDED NEEDLE) 21G X 1-1/2" MISC Use 21guage needle to inject Testosterone Cypionate 1ml.   omeprazole (PRILOSEC) 20 MG capsule Take 20 mg by mouth daily.   Protein POWD Take 1 Scoop by mouth daily.   testosterone cypionate (DEPOTESTOSTERONE CYPIONATE) 200 MG/ML injection INJECT 0.5 MLS INTRAMUSCULARLY EVERY WEEK   No facility-administered medications prior to  visit.    Review of Systems per HPI     Objective    BP 137/89 (BP Location: Left Arm, Patient Position: Sitting, Cuff Size: Large)   Pulse 91   Temp 97.8 F (36.6 C) (Temporal)   Resp 12   Wt 232 lb (105.2 kg)   BMI 29.79 kg/m    Physical Exam Vitals reviewed.  Constitutional:      General: He is not in acute distress.    Appearance: Normal appearance. He is not diaphoretic.  HENT:     Head: Normocephalic and atraumatic.  Eyes:     General: No scleral icterus.    Conjunctiva/sclera: Conjunctivae normal.  Cardiovascular:     Rate and Rhythm: Normal rate and regular rhythm.     Pulses: Normal pulses.     Heart sounds: Normal heart sounds. No murmur heard. Pulmonary:     Effort: Pulmonary effort is normal. No respiratory distress.     Breath sounds: Normal breath sounds. No wheezing or rhonchi.  Musculoskeletal:     Cervical back: Neck supple.     Right lower leg: No edema.     Left lower leg: No edema.  Lymphadenopathy:     Cervical: No cervical adenopathy.  Skin:    General: Skin is warm and dry.     Findings: No rash.  Neurological:     Mental Status: He is alert and oriented to person, place, and time. Mental status is at baseline.  Psychiatric:        Mood and Affect: Mood normal.        Behavior: Behavior normal.       Results for orders placed or performed in visit on 03/31/23  POCT glycosylated hemoglobin (Hb A1C)  Result Value Ref Range   Hemoglobin A1C 9.0 (A) 4.0 - 5.6 %   Est. average glucose Bld gHb Est-mCnc 212     Assessment & Plan     Problem List Items Addressed This Visit       Cardiovascular and Mediastinum   Hypertension associated with diabetes (HCC)    Well controlled Continue current medications Reviewed metabolic panel F/u in 3 months       Relevant Medications   Semaglutide (RYBELSUS) 3 MG TABS   Semaglutide (RYBELSUS) 7 MG TABS   atorvastatin (LIPITOR) 40 MG tablet     Endocrine   T2DM (type 2 diabetes mellitus)  (HCC) - Primary    Chronic and uncontrolled Continue current medications Add Rybelsus UTD on vaccines, needs eye exam On ACEi/ARB Resume statin Discussed diet and exercise F/u in 3 months       Relevant Medications   Semaglutide (RYBELSUS) 3 MG TABS   Semaglutide (RYBELSUS) 7 MG TABS   atorvastatin (LIPITOR) 40 MG tablet   Other Relevant Orders   POCT glycosylated hemoglobin (Hb A1C) (Completed)   Hyperlipidemia associated with type 2 diabetes mellitus (HCC)    Was previously well-controlled, but then stopped taking his statin He reports he does not know why he stopped taking this and does not remember any side effects Encouraged to resume atorvastatin 40 mg daily Goal LDL less  than 70      Relevant Medications   Semaglutide (RYBELSUS) 3 MG TABS   Semaglutide (RYBELSUS) 7 MG TABS   atorvastatin (LIPITOR) 40 MG tablet   Other Visit Diagnoses     Other fatigue       Relevant Orders   CBC with Differential/Platelet   Comprehensive metabolic panel   Vitamin B12   VITAMIN D 25 Hydroxy (Vit-D Deficiency, Fractures)   TSH   Long-term use of high-risk medication       Relevant Orders   CBC with Differential/Platelet   Comprehensive metabolic panel   Vitamin B12   VITAMIN D 25 Hydroxy (Vit-D Deficiency, Fractures)   TSH        Return in about 3 months (around 06/30/2023) for chronic disease f/u.      I, Shirlee Latch, MD, have reviewed all documentation for this visit. The documentation on 03/31/23 for the exam, diagnosis, procedures, and orders are all accurate and complete.   Faizah Kandler, Marzella Schlein, MD, MPH [facility]

## 2023-03-31 NOTE — Assessment & Plan Note (Signed)
Well controlled Continue current medications Reviewed metabolic panel F/u in 3 months  

## 2023-03-31 NOTE — Assessment & Plan Note (Signed)
Chronic and uncontrolled Continue current medications Add Rybelsus UTD on vaccines, needs eye exam On ACEi/ARB Resume statin Discussed diet and exercise F/u in 3 months

## 2023-03-31 NOTE — Assessment & Plan Note (Signed)
Was previously well-controlled, but then stopped taking his statin He reports he does not know why he stopped taking this and does not remember any side effects Encouraged to resume atorvastatin 40 mg daily Goal LDL less than 70

## 2023-04-01 LAB — COMPREHENSIVE METABOLIC PANEL
ALT: 24 IU/L (ref 0–44)
AST: 20 IU/L (ref 0–40)
Albumin/Globulin Ratio: 2 (ref 1.2–2.2)
Albumin: 4.1 g/dL (ref 3.9–4.9)
Alkaline Phosphatase: 86 IU/L (ref 44–121)
BUN/Creatinine Ratio: 17 (ref 10–24)
BUN: 15 mg/dL (ref 8–27)
Bilirubin Total: 0.9 mg/dL (ref 0.0–1.2)
CO2: 19 mmol/L — ABNORMAL LOW (ref 20–29)
Calcium: 9.4 mg/dL (ref 8.6–10.2)
Chloride: 103 mmol/L (ref 96–106)
Creatinine, Ser: 0.88 mg/dL (ref 0.76–1.27)
Globulin, Total: 2.1 g/dL (ref 1.5–4.5)
Glucose: 135 mg/dL — ABNORMAL HIGH (ref 70–99)
Potassium: 4.1 mmol/L (ref 3.5–5.2)
Sodium: 139 mmol/L (ref 134–144)
Total Protein: 6.2 g/dL (ref 6.0–8.5)
eGFR: 94 mL/min/{1.73_m2} (ref >59)

## 2023-04-01 LAB — CBC WITH DIFFERENTIAL/PLATELET
Basophils Absolute: 0 10*3/uL (ref 0.0–0.2)
Basos: 0 %
EOS (ABSOLUTE): 0.1 10*3/uL (ref 0.0–0.4)
Eos: 3 %
Hematocrit: 48.9 % (ref 37.5–51.0)
Hemoglobin: 16 g/dL (ref 13.0–17.7)
Immature Grans (Abs): 0 10*3/uL (ref 0.0–0.1)
Immature Granulocytes: 0 %
Lymphocytes Absolute: 1.6 10*3/uL (ref 0.7–3.1)
Lymphs: 30 %
MCH: 25.9 pg — ABNORMAL LOW (ref 26.6–33.0)
MCHC: 32.7 g/dL (ref 31.5–35.7)
MCV: 79 fL (ref 79–97)
Monocytes Absolute: 0.7 10*3/uL (ref 0.1–0.9)
Monocytes: 14 %
Neutrophils Absolute: 2.9 10*3/uL (ref 1.4–7.0)
Neutrophils: 53 %
Platelets: 229 10*3/uL (ref 150–450)
RBC: 6.18 x10E6/uL — ABNORMAL HIGH (ref 4.14–5.80)
RDW: 15.3 % (ref 11.6–15.4)
WBC: 5.5 10*3/uL (ref 3.4–10.8)

## 2023-04-01 LAB — TSH: TSH: 2.94 u[IU]/mL (ref 0.450–4.500)

## 2023-04-01 LAB — VITAMIN D 25 HYDROXY (VIT D DEFICIENCY, FRACTURES): Vit D, 25-Hydroxy: 24 ng/mL — ABNORMAL LOW (ref 30.0–100.0)

## 2023-04-01 LAB — VITAMIN B12: Vitamin B-12: 350 pg/mL (ref 232–1245)

## 2023-04-03 ENCOUNTER — Other Ambulatory Visit: Payer: Self-pay | Admitting: *Deleted

## 2023-04-03 MED ORDER — MYRBETRIQ 50 MG PO TB24: 50.0000 mg | ORAL_TABLET | Freq: Every day | ORAL | 11 refills | Status: DC

## 2023-04-28 ENCOUNTER — Other Ambulatory Visit: Payer: Self-pay

## 2023-04-28 DIAGNOSIS — D751 Secondary polycythemia: Secondary | ICD-10-CM

## 2023-04-29 ENCOUNTER — Inpatient Hospital Stay: Payer: BC Managed Care – PPO

## 2023-05-01 ENCOUNTER — Inpatient Hospital Stay: Payer: BC Managed Care – PPO | Attending: Oncology

## 2023-05-01 ENCOUNTER — Inpatient Hospital Stay: Payer: BC Managed Care – PPO

## 2023-05-01 DIAGNOSIS — D751 Secondary polycythemia: Secondary | ICD-10-CM | POA: Insufficient documentation

## 2023-05-01 LAB — HEMOGLOBIN AND HEMATOCRIT (CANCER CENTER ONLY)
HCT: 47.8 % (ref 39.0–52.0)
Hemoglobin: 15.7 g/dL (ref 13.0–17.0)

## 2023-05-01 NOTE — Progress Notes (Signed)
Hct 47.8. NO phlebotomy today

## 2023-05-14 IMAGING — CT CT ANKLE*R* W/O CM
3 of 6 series · 11 of 36 positions shown, 12 images · non-contrast
Comparison: Radiograph performed on the same date.

CLINICAL DATA: Ankle pain. Right ankle soft tissue swelling and
redness.

EXAM:
CT OF THE RIGHT ANKLE WITHOUT CONTRAST
TECHNIQUE: Multidetector CT imaging of the right ankle was performed according
to the standard protocol. Multiplanar CT image reconstructions were
also generated.

[Series 5: axial bone · axial · 0.30mm/px · z∈[+203,+333]mm · 4 of 196 slices shown, 5 images]
[im 33/196  soft-tissue]
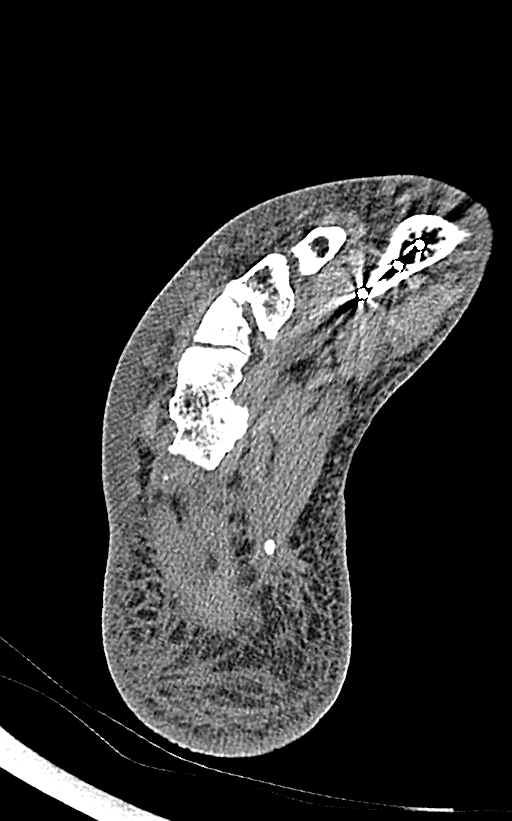
[im 33/196  bone]
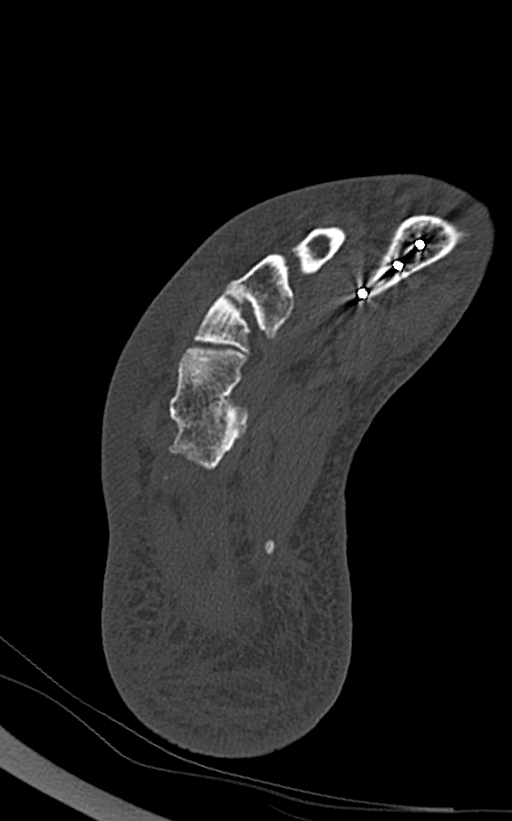
[im 66/196  bone]
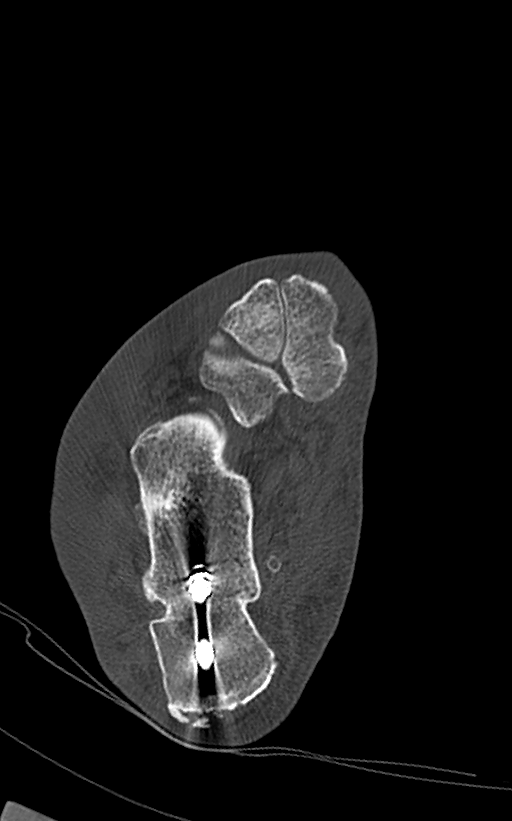
[im 131/196  bone]
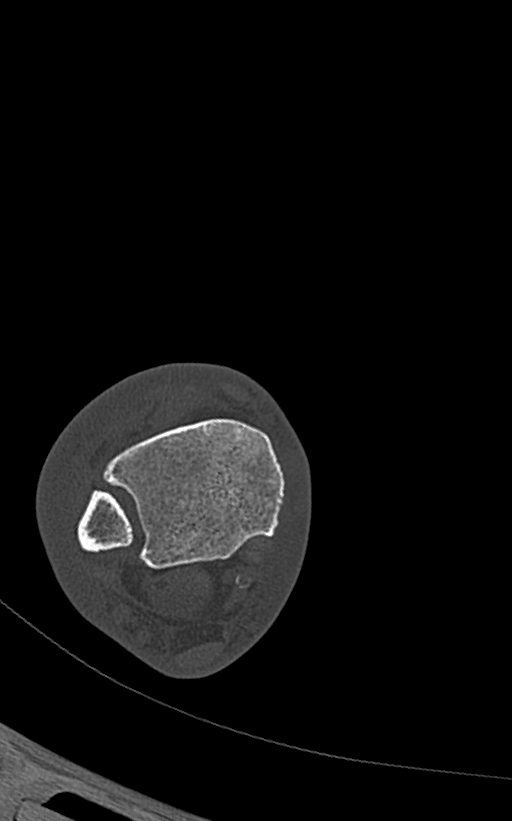
[im 163/196  bone]
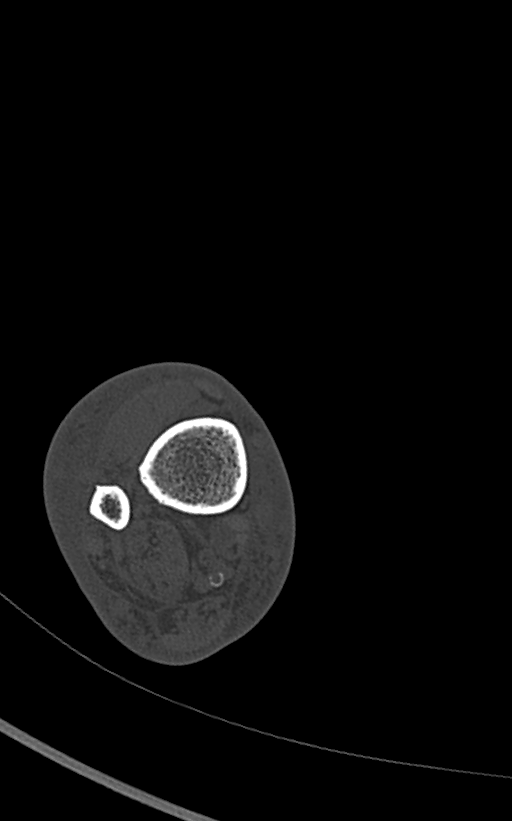

[Series 6: cor bone · coronal · 0.30mm/px · 1 of 251 slices shown]
[im 126/251  bone]
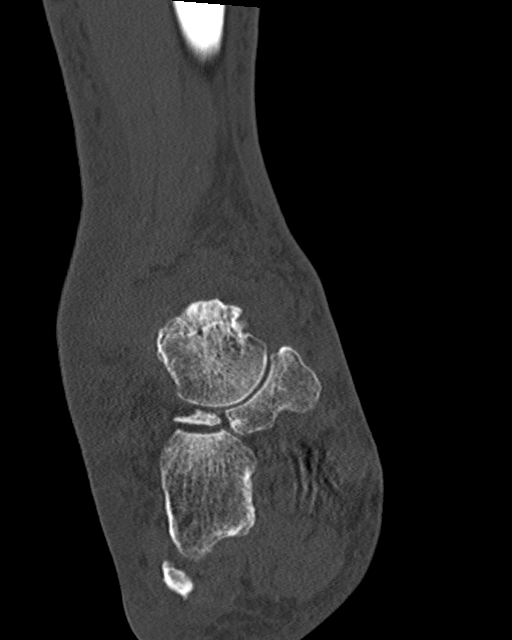

[Series 7: sag bone · sagittal · 0.38mm/px · 6 of 157 slices shown]
[im 19/157  soft-tissue]
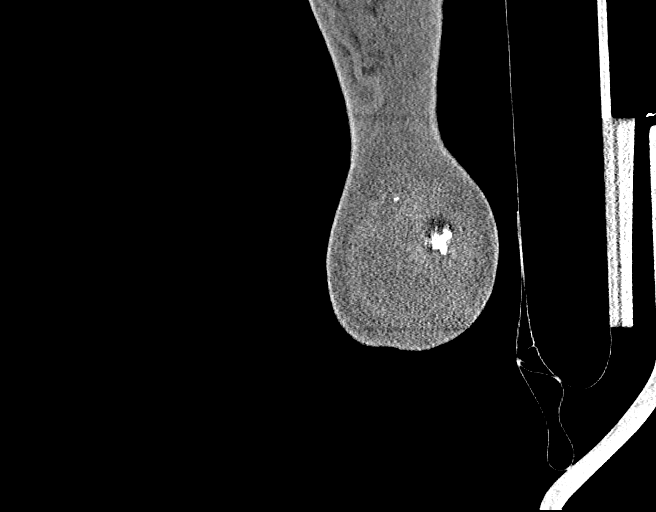
[im 27/157  bone]
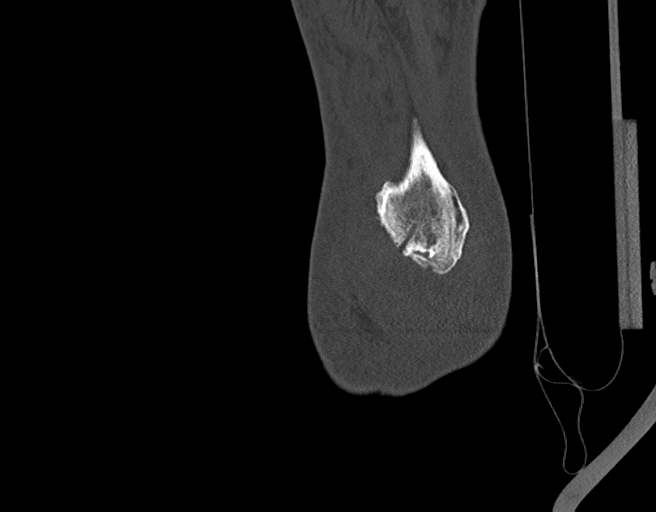
[im 53/157  bone]
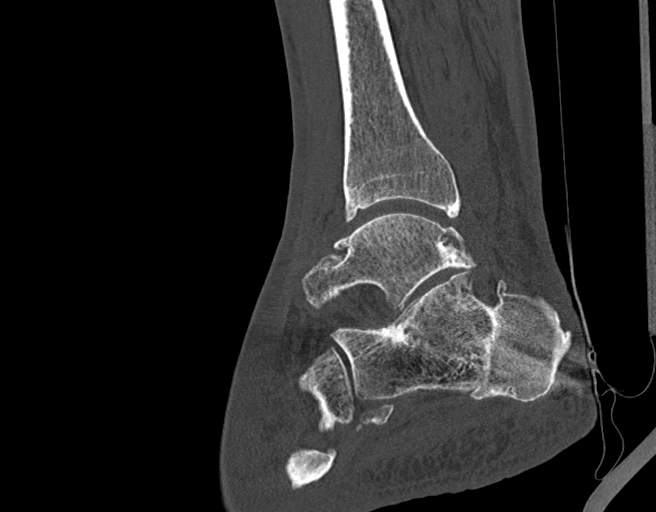
[im 79/157  bone]
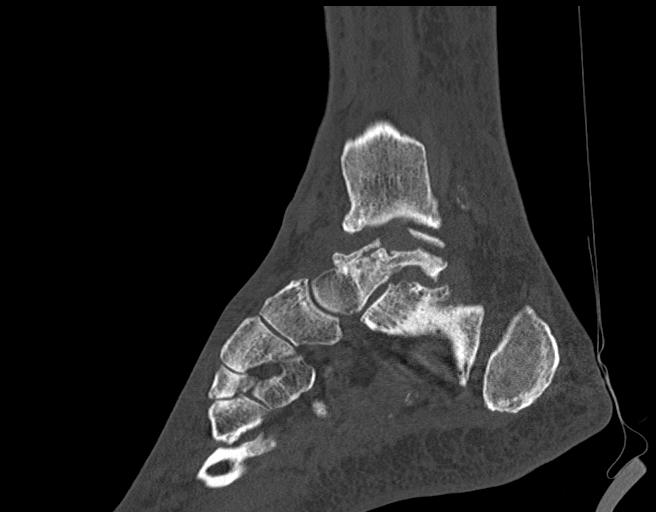
[im 105/157  bone]
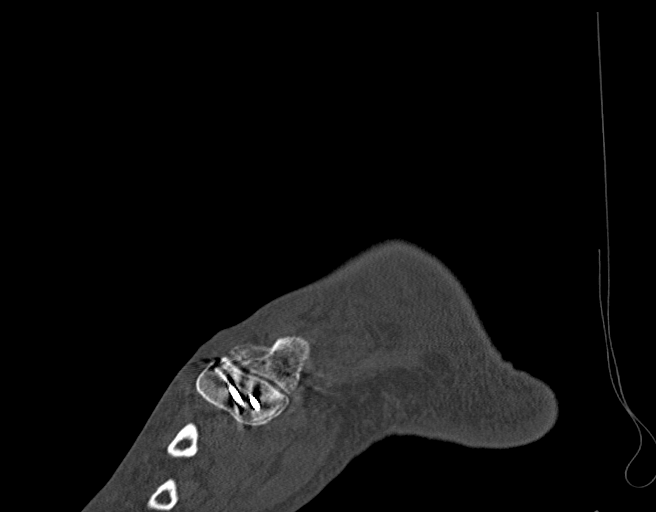
[im 131/157  bone]
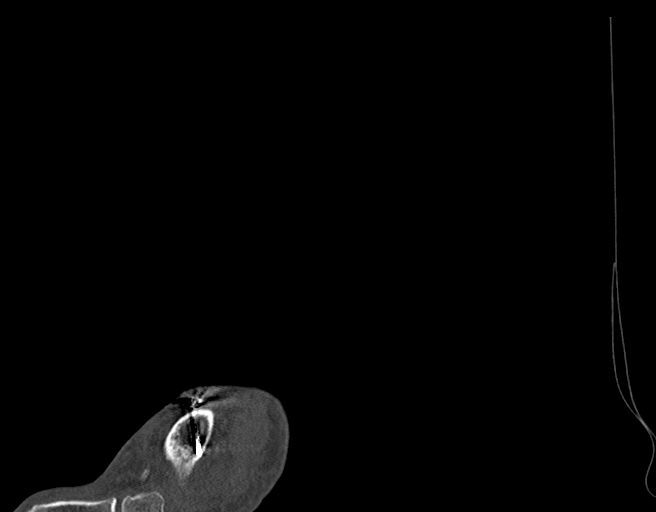

[11 of 36 positions shown; findings below may reference images not displayed]

FINDINGS: Bones/Joint/Cartilage

Postsurgical changes with two thicker smoker screws in the
calcaneus. No acute fracture or dislocation. Arthropathy of the
tibiotalar and talonavicular joints with osteophytes. Partially
imaged hardware in the first metatarsal.

Ligaments

Suboptimally assessed by CT.

Muscles and Tendons

Muscles are normal in bulk.  No evidence of acute tendon tear.

Soft tissues

Marked subcutaneous soft tissue edema with a peripherally enhancing
collection about the lateral aspect of the ankle measuring at least
5.6 x 2.9 by 3.7 cm, likely representing abscess.
IMPRESSION: 1. Peripherally enhancing fluid collection in the subcutaneous soft
tissues on the lateral aspect of the ankle measuring at least 5.6 x
2.9 x 3.7 cm. Generalized subcutaneous soft tissue edema about the
ankle, concerning for cellulitis.

2. Postsurgical changes about the calcaneus without evidence of
acute fracture.

3.  Arthropathy of the tibiotalar and talonavicular joints.

## 2023-05-14 IMAGING — MR MR ANKLE*R* WO/W CM
8 of 9 series · 33 of 40 positions shown · IV contrast (gadavist)
Comparison: CT right ankle 11/10/2021

CLINICAL DATA: Multiple right ankle surgeries. Pain and swelling
around the right ankle.

EXAM:
MRI OF THE RIGHT ANKLE WITHOUT AND WITH CONTRAST
TECHNIQUE: Multiplanar, multisequence MR imaging of the ankle was performed
before and after the administration of intravenous contrast.
CONTRAST:  10mL GADAVIST GADOBUTROL 1 MMOL/ML IV SOLN

[Series 4: PD fat-sat · axial · right · 3.5mm · 0.50mm/px · z∈[-54,+108]mm · 4 of 36 slices shown]
[im 1/36]
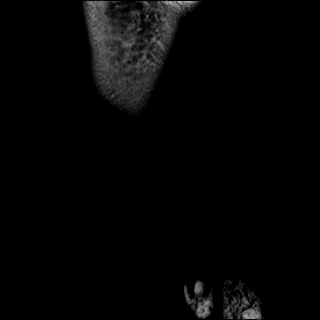
[im 12/36]
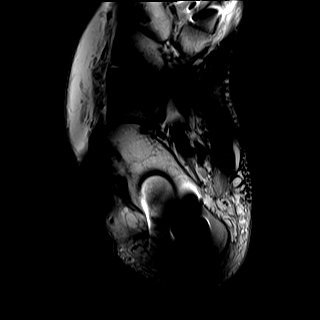
[im 24/36]
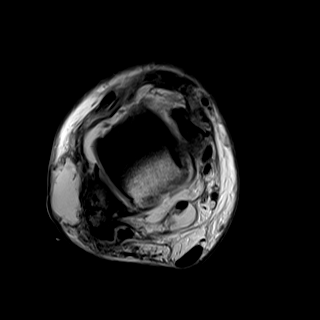
[im 36/36]
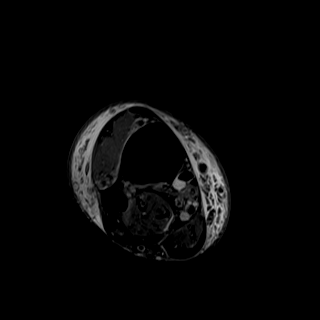

[Series 5: T2 fat-sat · axial · right · 3.5mm · 0.50mm/px · z∈[-54,+108]mm · 5 of 36 slices shown]
[im 1/36]
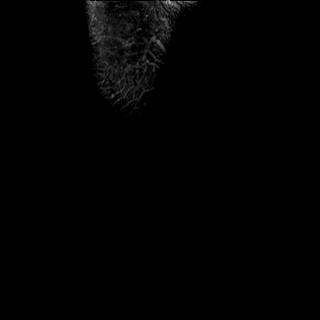
[im 9/36]
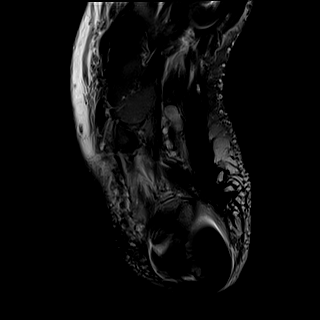
[im 18/36]
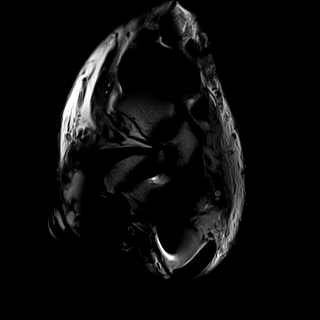
[im 27/36]
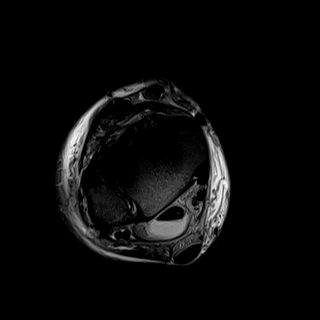
[im 36/36]
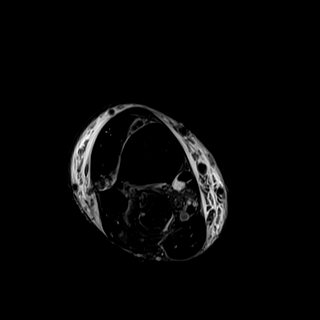

[Series 7: T2 · coronal · right · 3.0mm · 0.62mm/px · 2 of 40 slices shown]
[im 1/40]
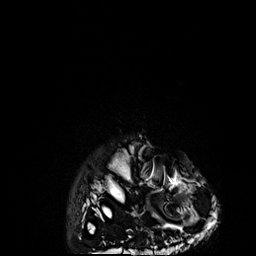
[im 8/40]
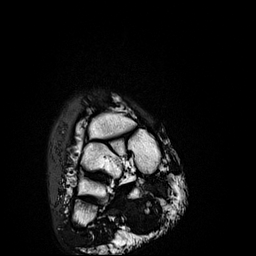

[Series 8: T1 · sagittal · right · 4.5mm · 0.70mm/px · 3 of 22 slices shown]
[im 1/22]
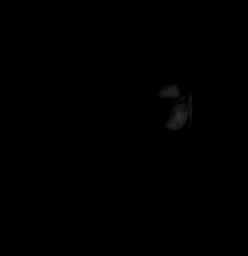
[im 11/22]
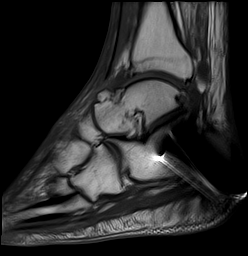
[im 22/22]
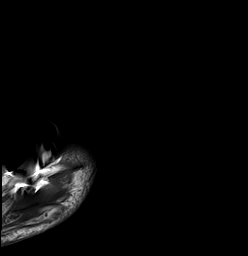

[Series 13: T1 fat-sat · axial · non-contrast · right · 3.5mm · 0.31mm/px · z∈[-54,+108]mm · 5 of 36 slices shown]
[im 1/36]
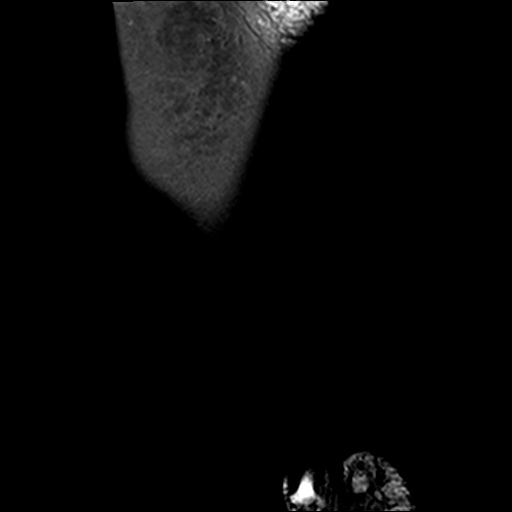
[im 9/36]
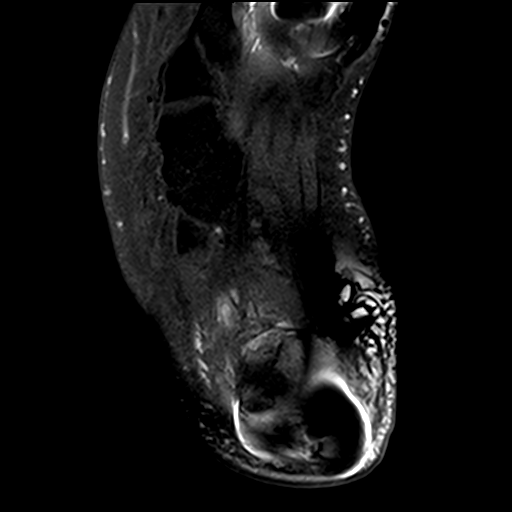
[im 18/36]
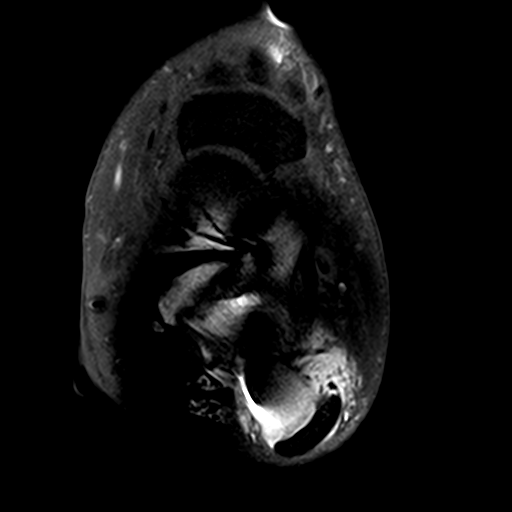
[im 27/36]
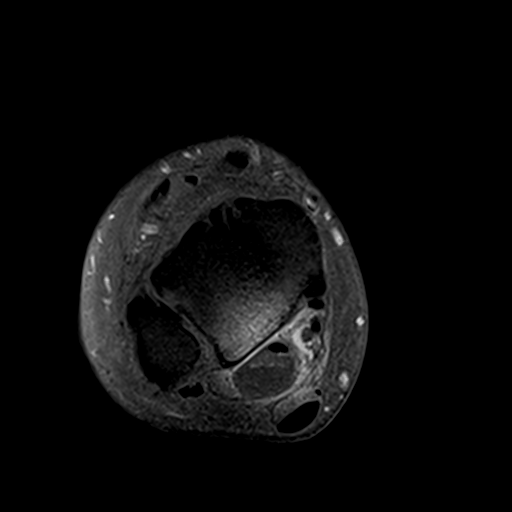
[im 36/36]
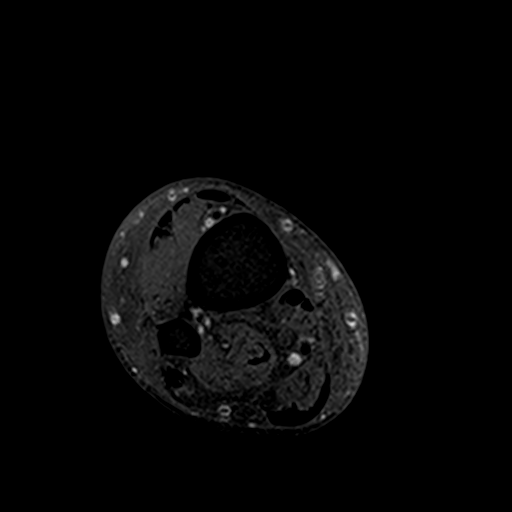

[Series 14: T1 fat-sat post-contrast · axial · right · 3.5mm · 0.31mm/px · z∈[-54,+108]mm · 5 of 36 slices shown (1 of 3)]
[im 1/36]
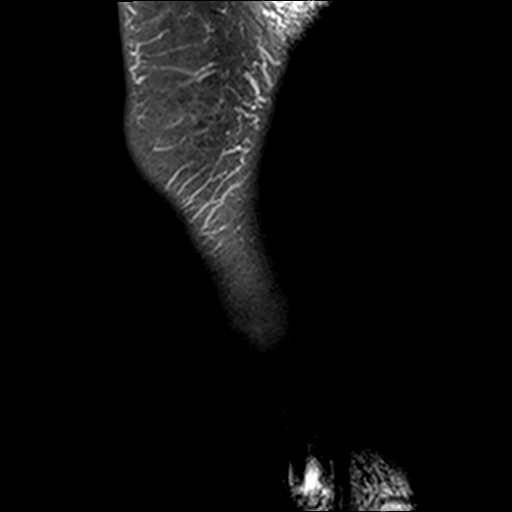
[im 9/36]
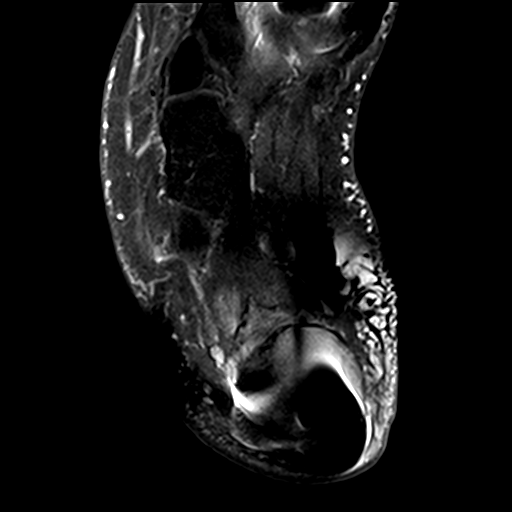
[im 18/36]
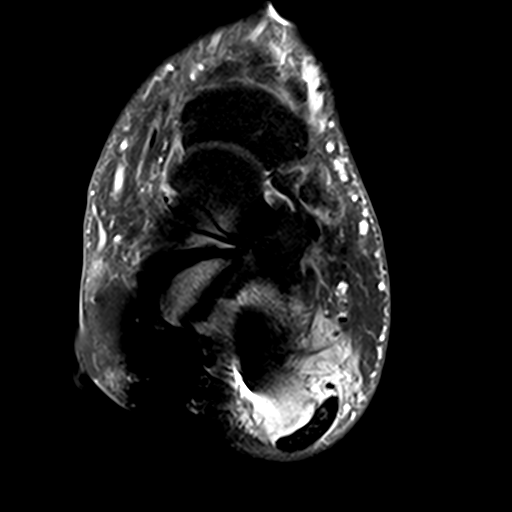
[im 27/36]
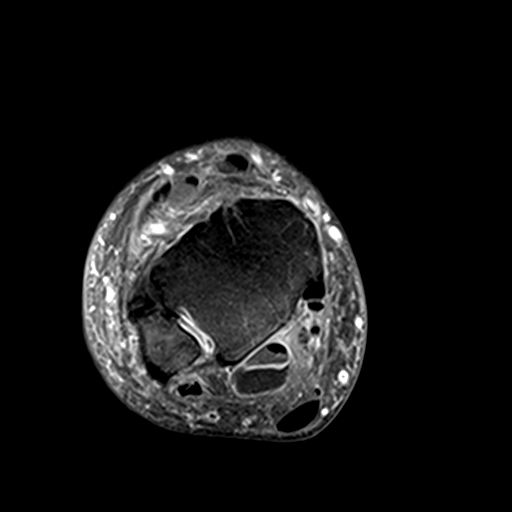
[im 36/36]
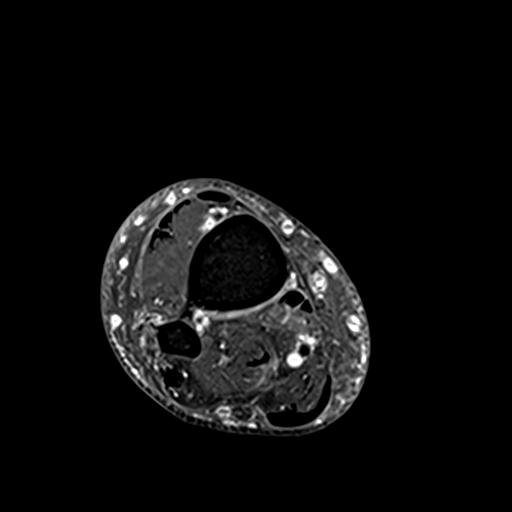

[Series 15: T1 fat-sat post-contrast · coronal · right · 3.0mm · 0.62mm/px · 6 of 40 slices shown (2 of 3)]
[im 1/40]
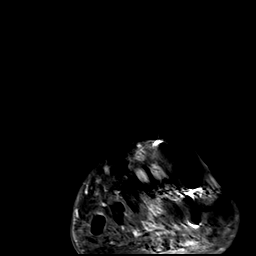
[im 8/40]
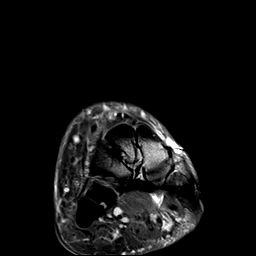
[im 16/40]
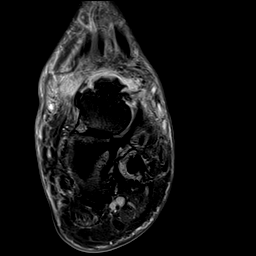
[im 24/40]
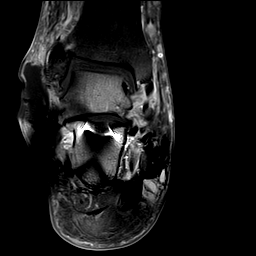
[im 32/40]
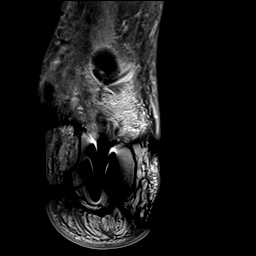
[im 40/40]
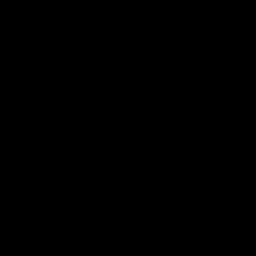

[Series 16: T1 fat-sat post-contrast · sagittal · right · 4.5mm · 0.70mm/px · 3 of 22 slices shown (3 of 3)]
[im 1/22]
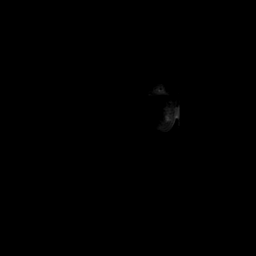
[im 11/22]
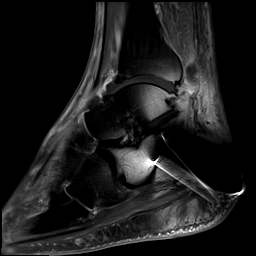
[im 22/22]
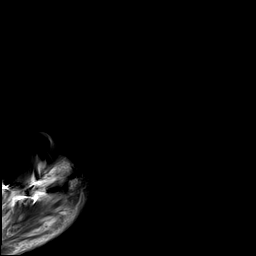

[33 of 40 positions shown; findings below may reference images not displayed]

FINDINGS: Susceptibility artifact resulting from the orthopedic hardware in
the calcaneus partially obscures adjacent soft tissue and osseous
structures.

TENDONS

Peroneal: Peroneal longus tendon intact. Peroneal brevis intact.

Posteromedial: Posterior tibial tendon intact. Flexor hallucis
longus tendon intact. Flexor digitorum longus tendon intact.
Moderate amount of fluid in the flexor hallucis tendon sheath at the
level of the tibiotalar joint.

Anterior: Tibialis anterior tendon intact. Extensor hallucis longus
tendon intact Extensor digitorum longus tendon intact.

Achilles:  Intact.

Plantar Fascia: Intact.

LIGAMENTS

Lateral: Chronic tear of the anterior talofibular ligament and
calcaneofibular ligament. Prior injury of the posterior talofibular
ligament without a complete tear. Anterior and posterior
tibiofibular ligaments intact. Postsurgical changes from prior
anterior talofibular ligament repair which appears torn.

Medial: Not well visualized secondary to susceptibility artifact.

CARTILAGE

Ankle Joint: Moderate ankle joint effusion. Peripherally enhancing
3.6 x 2.1 x 4 cm fluid collection in the soft tissues along the
anterolateral aspect of the ankle joint abutting the lateral
malleolus and contiguous with the tibiotalar joint space most
concerning for an abscess and septic arthritis. Partial-thickness
cartilage loss of the tibiotalar joint.

Subtalar Joints/Sinus Tarsi: Normal subtalar joints. No subtalar
joint effusion. Normal sinus tarsi.

Bones: No acute fracture or dislocation. Complex fluid collection
abuts the lateral malleolus with subcortical bone marrow edema
concerning for osteomyelitis. Arthrodesis of the first TMT joint.
Healed calcaneal osteotomy with orthopedic hardware in place. Mild
osteoarthritis of the talonavicular joint and calcaneocuboid joint.

Soft Tissue: Generalized soft tissue edema around the ankle and
dorsal foot concerning for cellulitis. Muscles are normal without
edema or atrophy. Tarsal tunnel is normal.
IMPRESSION: IMPRESSION
1. Moderate ankle joint effusion. Peripherally enhancing 3.6 x 2.1 x
4 cm fluid collection in the soft tissues along the anterolateral
aspect of the ankle joint abutting the lateral malleolus and
contiguous with the tibiotalar joint space most concerning for an
abscess and septic arthritis. Complex fluid collection abuts the
lateral malleolus with subcortical bone marrow edema concerning for
osteomyelitis.
2. Generalized soft tissue edema around the ankle and dorsal foot
concerning for cellulitis.

## 2023-05-26 ENCOUNTER — Telehealth: Payer: Self-pay | Admitting: Family Medicine

## 2023-05-28 ENCOUNTER — Other Ambulatory Visit: Payer: Self-pay | Admitting: Urology

## 2023-05-28 DIAGNOSIS — E291 Testicular hypofunction: Secondary | ICD-10-CM

## 2023-05-29 NOTE — Telephone Encounter (Signed)
Patient advised and lab appointment made 

## 2023-06-18 ENCOUNTER — Encounter: Payer: Self-pay | Admitting: Oncology

## 2023-06-29 ENCOUNTER — Encounter: Payer: Self-pay | Admitting: Oncology

## 2023-07-02 ENCOUNTER — Ambulatory Visit: Payer: BC Managed Care – PPO | Admitting: Family Medicine

## 2023-07-03 ENCOUNTER — Encounter: Payer: Self-pay | Admitting: Oncology

## 2023-07-06 ENCOUNTER — Encounter: Payer: Self-pay | Admitting: Family Medicine

## 2023-07-06 ENCOUNTER — Ambulatory Visit (INDEPENDENT_AMBULATORY_CARE_PROVIDER_SITE_OTHER): Payer: Self-pay | Admitting: Family Medicine

## 2023-07-06 VITALS — BP 110/72 | HR 78 | Temp 97.6°F | Resp 12 | Ht 74.0 in | Wt 231.5 lb

## 2023-07-06 DIAGNOSIS — E1159 Type 2 diabetes mellitus with other circulatory complications: Secondary | ICD-10-CM

## 2023-07-06 DIAGNOSIS — F5101 Primary insomnia: Secondary | ICD-10-CM

## 2023-07-06 DIAGNOSIS — E785 Hyperlipidemia, unspecified: Secondary | ICD-10-CM

## 2023-07-06 DIAGNOSIS — E1165 Type 2 diabetes mellitus with hyperglycemia: Secondary | ICD-10-CM

## 2023-07-06 DIAGNOSIS — E1169 Type 2 diabetes mellitus with other specified complication: Secondary | ICD-10-CM

## 2023-07-06 DIAGNOSIS — I152 Hypertension secondary to endocrine disorders: Secondary | ICD-10-CM

## 2023-07-06 LAB — POCT GLYCOSYLATED HEMOGLOBIN (HGB A1C): Hemoglobin A1C: 8 % — AB (ref 4.0–5.6)

## 2023-07-06 MED ORDER — FLUTICASONE PROPIONATE 50 MCG/ACT NA SUSP: 2.0000 | Freq: Every day | NASAL | 1 refills | Status: DC

## 2023-07-06 MED ORDER — SITAGLIPTIN PHOSPHATE 50 MG PO TABS
50.0000 mg | ORAL_TABLET | Freq: Every day | ORAL | 2 refills | Status: DC
Start: 2023-07-06 — End: 2024-03-22

## 2023-07-06 MED ORDER — ATORVASTATIN CALCIUM 40 MG PO TABS: 40.0000 mg | ORAL_TABLET | Freq: Every day | ORAL | 3 refills | Status: DC

## 2023-07-06 NOTE — Assessment & Plan Note (Signed)
Not currently on statin therapy. Discussed the benefits of statin therapy in preventing heart attack and stroke, especially in the context of diabetes and hypertension. -resume Atorvastatin. -Recheck lipid panel in 3 months.

## 2023-07-06 NOTE — Progress Notes (Signed)
Established Patient Office Visit  Subjective   Patient ID: Jonathan Fields, male    DOB: 1956/03/17  Age: 67 y.o. MRN: [id number]  Chief Complaint  Patient presents with   Medical Management of Chronic Issues    HPI Discussed the use of AI scribe software for clinical note transcription with the patient, who gave verbal consent to proceed.  History of Present Illness   The patient, with a history of diabetes, hypertension, and hyperlipidemia, presents for a follow-up visit. The patient's primary concern is their diabetes management, specifically their A1c level, which is currently at 8, down from 9, but still above the target of under 7. The patient had been prescribed Rybelsus for diabetes management but stopped taking it due to stomach issues. The patient is currently on metformin XR and Farxiga for diabetes management. The patient also mentions having shoulder pain and taking Aleve PM for sleep, which also helps with the pain. The patient has a history of ankle surgeries and is wary of further surgeries. The patient's blood pressure was good during the visit despite a stressful week. The patient's target weight is 225, and they are currently at 231. The patient exercises three times a week and tries to maintain a clean diet, with occasional indulgence in ice cream.         ROS    Objective:     BP 110/72 (BP Location: Left Arm, Patient Position: Sitting, Cuff Size: Large)   Pulse 78   Temp 97.6 F (36.4 C) (Temporal)   Resp 12   Ht 6\' 2"  (1.88 m)   Wt 231 lb 8 oz (105 kg)   SpO2 96%   BMI 29.72 kg/m    Physical Exam Vitals reviewed.  Constitutional:      General: He is not in acute distress.    Appearance: Normal appearance. He is not diaphoretic.  HENT:     Head: Normocephalic and atraumatic.  Eyes:     General: No scleral icterus.    Conjunctiva/sclera: Conjunctivae normal.  Cardiovascular:     Rate and Rhythm: Normal rate and regular rhythm.     Heart  sounds: Normal heart sounds. No murmur heard. Pulmonary:     Effort: Pulmonary effort is normal. No respiratory distress.     Breath sounds: Normal breath sounds. No wheezing or rhonchi.  Musculoskeletal:     Cervical back: Neck supple.     Right lower leg: No edema.     Left lower leg: No edema.  Lymphadenopathy:     Cervical: No cervical adenopathy.  Skin:    General: Skin is warm and dry.     Findings: No rash.  Neurological:     Mental Status: He is alert and oriented to person, place, and time. Mental status is at baseline.  Psychiatric:        Mood and Affect: Mood normal.        Behavior: Behavior normal.     No results found for any visits on 07/06/23.    The 10-year ASCVD risk score (Arnett DK, et al., 2019) is: 29.4%    Assessment & Plan:   Problem List Items Addressed This Visit       Cardiovascular and Mediastinum   Hypertension associated with diabetes (HCC) - Primary    Well controlled Continue current medications Reviewed metabolic panel F/u in 3 months       Relevant Medications   sitaGLIPtin (JANUVIA) 50 MG tablet   atorvastatin (LIPITOR) 40 MG tablet  Endocrine   T2DM (type 2 diabetes mellitus) (HCC)    A1c improved from 9 to 8, but still above target of <7. Currently on Metformin XR 1000mg  BID and Farxiga 10mg  daily. Rybelsus discontinued due to stomach issues. Discussed adding Januvia to regimen. -Start Januvia 50mg  daily. -Recheck A1c in 3 months. - ROI for last eye exam      Relevant Medications   sitaGLIPtin (JANUVIA) 50 MG tablet   atorvastatin (LIPITOR) 40 MG tablet   Other Relevant Orders   POCT glycosylated hemoglobin (Hb A1C)   Hyperlipidemia associated with type 2 diabetes mellitus (HCC)    Not currently on statin therapy. Discussed the benefits of statin therapy in preventing heart attack and stroke, especially in the context of diabetes and hypertension. -resume Atorvastatin. -Recheck lipid panel in 3 months.       Relevant Medications   sitaGLIPtin (JANUVIA) 50 MG tablet   atorvastatin (LIPITOR) 40 MG tablet     Other   Primary insomnia       Allergic Rhinitis Discussed prescription options for nasal spray. -Prescribe Flonase nasal spray.  Insomnia Currently using Aleve PM for sleep and shoulder pain. Discussed potential risks of regular use of Aleve PM in older adults and alternative options. -Consider switching to Tylenol or using Melatonin for sleep.  General Health Maintenance -Ensure up-to-date eye exam records are obtained from [facility]. -Schedule follow-up appointment in 3 months.        Return in about 3 months (around 10/06/2023) for chronic disease f/u.    Shirlee Latch, MD

## 2023-07-06 NOTE — Assessment & Plan Note (Signed)
A1c improved from 9 to 8, but still above target of <7. Currently on Metformin XR 1000mg  BID and Farxiga 10mg  daily. Rybelsus discontinued due to stomach issues. Discussed adding Januvia to regimen. -Start Januvia 50mg  daily. -Recheck A1c in 3 months. - ROI for last eye exam

## 2023-07-06 NOTE — Assessment & Plan Note (Signed)
Well controlled Continue current medications Reviewed metabolic panel F/u in 3 months  

## 2023-07-14 ENCOUNTER — Ambulatory Visit (INDEPENDENT_AMBULATORY_CARE_PROVIDER_SITE_OTHER): Payer: 59 | Admitting: Podiatry

## 2023-07-14 ENCOUNTER — Encounter: Payer: Self-pay | Admitting: Podiatry

## 2023-07-14 ENCOUNTER — Encounter: Payer: Self-pay | Admitting: Oncology

## 2023-07-14 DIAGNOSIS — M25371 Other instability, right ankle: Secondary | ICD-10-CM

## 2023-07-20 ENCOUNTER — Encounter: Payer: Self-pay | Admitting: Oncology

## 2023-07-20 NOTE — Progress Notes (Signed)
Chief Complaint  Patient presents with   Callouses    "Shave the callouses on my feet."    HPI: 67 y.o. male presenting today for follow-up evaluation of cavovarus foot deformity with ankle instability right lower extremity.  Patient developed symptomatic calluses.  He says that he is doing very well at the moment.  He wears hightop tennis shoes which do help create some stability to the ankle.  No new complaints  Past Medical History:  Diagnosis Date   BPH (benign prostatic hyperplasia)    Bronchitis    Complication of anesthesia    hard to wake up   DM (diabetes mellitus), type 2 (HCC)    DVT (deep venous thrombosis) (HCC)    post op   ED (erectile dysfunction)    Erythrocytosis 07/04/2020   GERD (gastroesophageal reflux disease)    Hyperlipemia    Hypertension    Hypogonadism in male    Nocturia    Right ankle instability    Sleep apnea    TB lung, latent    Urinary frequency     Past Surgical History:  Procedure Laterality Date   ANKLE ARTHROSCOPY Right 11/13/2021   Procedure: ANKLE ARTHROSCOPY Arthroscopic Irrigation;  Surgeon: Edwin Cap, DPM;  Location: [facility] ORS;  Service: Podiatry;  Laterality: Right;   ANKLE SURGERY Left    torn ligament   CARPAL TUNNEL RELEASE Right 08/2019   COLONOSCOPY  2009   COLONOSCOPY WITH PROPOFOL N/A 01/23/2022   Procedure: COLONOSCOPY WITH PROPOFOL;  Surgeon: Midge Minium, MD;  Location: [facility] ENDOSCOPY;  Service: Endoscopy;  Laterality: N/A;   IRRIGATION AND DEBRIDEMENT ABSCESS Right 11/13/2021   Procedure: IRRIGATION AND DEBRIDEMENT ABSCESS;  Surgeon: Edwin Cap, DPM;  Location: [facility] ORS;  Service: Podiatry;  Laterality: Right;   IRRIGATION AND DEBRIDEMENT FOOT Right 11/16/2021   Procedure: IRRIGATION AND DEBRIDEMENT ANKLE;  Surgeon: Felecia Shelling, DPM;  Location: [facility] ORS;  Service: Podiatry;  Laterality: Right;   KNEE ARTHROSCOPY Left    right ankle ruptured tendon surgery  2008   TRANSURETHRAL RESECTION OF PROSTATE  N/A 02/19/2018   Procedure: TRANSURETHRAL RESECTION OF THE PROSTATE (TURP);  Surgeon: Hildred Laser, MD;  Location: [facility] ORS;  Service: Urology;  Laterality: N/A;    Allergies  Allergen Reactions   Penicillins Hives    Has patient had a PCN reaction causing immediate rash, facial/tongue/throat swelling, SOB or lightheadedness with hypotension: yes Has patient had a PCN reaction causing severe rash involving mucus membranes or skin necrosis: no Has patient had a PCN reaction that required hospitalization: no Has patient had a PCN reaction occurring within the last 10 years: no If all of the above answers are "NO", then may proceed with Cephalosporin use.      Physical Exam: General: The patient is alert and oriented x3 in no acute distress.  Dermatology: Skin is warm, dry and supple bilateral lower extremities.  Callus noted to the feet  Vascular: Palpable pedal pulses bilaterally. Capillary refill within normal limits.  Negative for any significant edema or erythema  Neurological: Light touch and protective threshold grossly intact  Musculoskeletal Exam: Cavus type deformity noted with rear foot varus and lateral column loading with weightbearing  Radiographic Exam RT foot 01/13/2023:  Cavovarus type foot deformity noted.  Orthopedic hardware noted along the first metatarsal and calcaneus.  No acute fractures identified.  No osseous erosions that would be concerning for osteomyelitis.  Assessment: 1.  Callus fifth metatarsal tubercle right foot 2.  Ankle  joint instability/cavovarus foot type right 3.  T2DM; uncomplicated  Plan of Care:  -Patient evaluated -Patient is doing very well.  Today there is minimal symptomatic callus to the foot.   -The foot is very stable and he wears hightop shoes to support his ankle and cavovarus foot deformity.  Continue conservative care -Return to clinic as needed       Felecia Shelling, DPM [facility]  Dr. Felecia Shelling, DPM    [address] [place], [place] [address]                Office [phone]  Fax [phone]

## 2023-07-22 ENCOUNTER — Other Ambulatory Visit: Payer: Self-pay

## 2023-07-22 ENCOUNTER — Other Ambulatory Visit: Payer: Self-pay | Admitting: Podiatry

## 2023-07-22 DIAGNOSIS — L97512 Non-pressure chronic ulcer of other part of right foot with fat layer exposed: Secondary | ICD-10-CM

## 2023-07-22 DIAGNOSIS — E291 Testicular hypofunction: Secondary | ICD-10-CM

## 2023-07-22 DIAGNOSIS — E119 Type 2 diabetes mellitus without complications: Secondary | ICD-10-CM

## 2023-07-22 DIAGNOSIS — M79671 Pain in right foot: Secondary | ICD-10-CM

## 2023-07-22 DIAGNOSIS — M25371 Other instability, right ankle: Secondary | ICD-10-CM

## 2023-07-23 LAB — TESTOSTERONE: Testosterone: 775 ng/dL (ref 264–916)

## 2023-07-27 ENCOUNTER — Inpatient Hospital Stay (HOSPITAL_BASED_OUTPATIENT_CLINIC_OR_DEPARTMENT_OTHER): Payer: Self-pay | Admitting: Oncology

## 2023-07-27 ENCOUNTER — Encounter: Payer: Self-pay | Admitting: Oncology

## 2023-07-27 ENCOUNTER — Inpatient Hospital Stay: Payer: Self-pay

## 2023-07-27 ENCOUNTER — Inpatient Hospital Stay: Payer: Self-pay | Attending: Oncology

## 2023-07-27 VITALS — BP 138/90 | HR 109 | Resp 18 | Wt 232.0 lb

## 2023-07-27 DIAGNOSIS — D751 Secondary polycythemia: Secondary | ICD-10-CM | POA: Insufficient documentation

## 2023-07-27 DIAGNOSIS — Z79890 Hormone replacement therapy (postmenopausal): Secondary | ICD-10-CM | POA: Insufficient documentation

## 2023-07-27 LAB — CBC WITH DIFFERENTIAL (CANCER CENTER ONLY)
Abs Immature Granulocytes: 0.02 10*3/uL (ref 0.00–0.07)
Basophils Absolute: 0 10*3/uL (ref 0.0–0.1)
Basophils Relative: 1 %
Eosinophils Absolute: 0.1 10*3/uL (ref 0.0–0.5)
Eosinophils Relative: 2 %
HCT: 48.1 % (ref 39.0–52.0)
Hemoglobin: 16.1 g/dL (ref 13.0–17.0)
Immature Granulocytes: 0 %
Lymphocytes Relative: 25 %
Lymphs Abs: 1.2 10*3/uL (ref 0.7–4.0)
MCH: 26.3 pg (ref 26.0–34.0)
MCHC: 33.5 g/dL (ref 30.0–36.0)
MCV: 78.6 fL — ABNORMAL LOW (ref 80.0–100.0)
Monocytes Absolute: 0.6 10*3/uL (ref 0.1–1.0)
Monocytes Relative: 12 %
Neutro Abs: 3 10*3/uL (ref 1.7–7.7)
Neutrophils Relative %: 60 %
Platelet Count: 178 10*3/uL (ref 150–400)
RBC: 6.12 MIL/uL — ABNORMAL HIGH (ref 4.22–5.81)
RDW: 14.8 % (ref 11.5–15.5)
WBC Count: 5 10*3/uL (ref 4.0–10.5)
nRBC: 0 % (ref 0.0–0.2)

## 2023-07-27 LAB — SAMPLE TO BLOOD BANK

## 2023-07-27 NOTE — Assessment & Plan Note (Signed)
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 Labs are reviewed and discussed with patient. hemoglobin 16.3, Hct 48.  No need for phlebotomy. Follow-up with urology for testosterone replacement management.

## 2023-07-27 NOTE — Progress Notes (Signed)
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 Labs are reviewed and discussed with patient. hemoglobin 16.3, Hct 48.  No need for phlebotomy. Follow-up with urology for testosterone replacement management.   Orders Placed This Encounter  Procedures   CBC with Differential (Cancer Center Only)    Standing Status:   Future    Standing Expiration Date:   07/26/2024   Hold Tube- Blood Bank    Standing Status:   Future    Number of Occurrences:   1    Standing Expiration Date:   07/26/2024   Follow up  4 months lab MD +/- Phleb cbc   All questions were answered. The patient knows to call the clinic with any problems, questions or concerns.  [name], MD, PhD [facility] Hematology Oncology 07/27/2023   HISTORY OF PRESENTING ILLNESS:  Jonathan Fields is a 67 y.o. male who was seen in consultation at the request of Beryle Flock, Marzella Schlein, MD for evaluation of polycytosis/erythrocytosis Reviewed patient's recent lab work 03/14/2020, hematocrit 55, 09/13/2019 hematocrit 51.8, 07/22/2019, hematocrit 49.1.  Hemoglobin 17.2.   Patient is on testosterone replacement.  Currently Dr. Lonna Cobb hold off treatments due to erythrocytosis. Patient was is Dr. Bonita Quin to hematology oncology for evaluation and management. Associated signs or symptoms: Patient mention about 15 pounds of unintentional weight loss recently during the past few weeks.  His appetite is unchanged.  He feels that he has eaten similar amount of food recently.  Denies any increase of activity level.  Some sweating for the past 2 days.  Context:  Smoking history: Denies. Testosterone supplements: History of blood clots: Previously he has an episode of provoked thrombosis after surgery. Daytime somnolence:  Denies Family history of polycythemia: Denies  Patient underwent stabilization of right foot complicated by wound dehiscence, septic arthritis of the right ankle, acute osteomyelitis.  Patient was taken to the OR for washout and debridement in December 2022.  Finished antibiotic course.  03/17/2022 for Sclerotherapy for symptomatic varicose veins of the right lower extremity.  INTERVAL HISTORY Jonathan Fields is a 66 y.o. male who has above history reviewed by me today presents for follow up visit for management of secondary erythrocytosis due to testosterone use Patient is on testosterone replacement therapy, 50mg  per week.  Denies any arthralgia, fever, chills.  Review of Systems  Constitutional:  Negative for appetite change, chills, fatigue, fever and unexpected weight change.  HENT:   Negative for hearing loss and voice change.   Eyes:  Negative for eye problems and icterus.  Respiratory:  Negative for chest tightness, cough and shortness of breath.   Cardiovascular:  Negative for chest pain and leg swelling.  Gastrointestinal:  Negative for abdominal distention and abdominal pain.  Endocrine: Negative for hot flashes.  Genitourinary:  Negative for difficulty urinating, dysuria and frequency.   Musculoskeletal:  Negative for arthralgias.  Skin:  Negative for itching and rash.  Neurological:  Negative for light-headedness and numbness.  Hematological:  Negative for adenopathy. Does not bruise/bleed easily.  Psychiatric/Behavioral:  Negative for confusion.     MEDICAL HISTORY:  Past Medical History:  Diagnosis Date   BPH (benign prostatic hyperplasia)    Bronchitis    Complication of anesthesia    hard to wake up   DM (diabetes mellitus), type 2 (HCC)    DVT (deep  venous thrombosis) (HCC)    post op   ED (erectile dysfunction)    Erythrocytosis 07/04/2020   GERD (gastroesophageal reflux disease)    Hyperlipemia    Hypertension    Hypogonadism in male    Nocturia    Right  ankle instability    Sleep apnea    TB lung, latent    Urinary frequency     SURGICAL HISTORY: Past Surgical History:  Procedure Laterality Date   ANKLE ARTHROSCOPY Right 11/13/2021   Procedure: ANKLE ARTHROSCOPY Arthroscopic Irrigation;  Surgeon: Edwin Cap, DPM;  Location: [facility] ORS;  Service: Podiatry;  Laterality: Right;   ANKLE SURGERY Left    torn ligament   CARPAL TUNNEL RELEASE Right 08/2019   COLONOSCOPY  2009   COLONOSCOPY WITH PROPOFOL N/A 01/23/2022   Procedure: COLONOSCOPY WITH PROPOFOL;  Surgeon: Midge Minium, MD;  Location[facility] ENDOSCOPY;  Service: Endoscopy;  Laterality: N/A;   IRRIGATION AND DEBRIDEMENT ABSCESS Right 11/13/2021   Procedure: IRRIGATION AND DEBRIDEMENT ABSCESS;  Surgeon: Edwin Cap, DPM;  Location: [facility] ORS;  Service: Podiatry;  Laterality: Right;   IRRIGATION AND DEBRIDEMENT FOOT Right 11/16/2021   Procedure: IRRIGATION AND DEBRIDEMENT ANKLE;  Surgeon: Felecia Shelling, DPM;  Location: [facility] ORS;  Service: Podiatry;  Laterality: Right;   KNEE ARTHROSCOPY Left    right ankle ruptured tendon surgery  2008   TRANSURETHRAL RESECTION OF PROSTATE N/A 02/19/2018   Procedure: TRANSURETHRAL RESECTION OF THE PROSTATE (TURP);  Surgeon: Hildred Laser, MD;  Location: [facility] ORS;  Service: Urology;  Laterality: N/A;    SOCIAL HISTORY: Social History   Socioeconomic History   Marital status: Married    Spouse name: Not on file   Number of children: 0   Years of education: Not on file   Highest education level: Not on file  Occupational History   Not on file  Tobacco Use   Smoking status: Never   Smokeless tobacco: Current    Types: Snuff   Tobacco comments:    tobaco pouches that produce juice, dip occasionally  Vaping Use   Vaping status: Never Used  Substance and Sexual Activity   Alcohol use: Not Currently    Comment: COUPLE OF DRINKS EVERY WEEK   Drug use: No   Sexual activity: Yes    Partners: Female    Birth control/protection:  None  Other Topics Concern   Not on file  Social History Narrative   Not on file   Social Determinants of Health   Financial Resource Strain: Not on file  Food Insecurity: Not on file  Transportation Needs: Not on file  Physical Activity: Not on file  Stress: Not on file  Social Connections: Not on file  Intimate Partner Violence: Not on file    FAMILY HISTORY: Family History  Problem Relation Age of Onset   Heart disease Father    Hypertension Father    Prostate cancer Father    Heart disease Mother    Breast cancer Mother    Colon cancer Neg Hx     ALLERGIES:  is allergic to penicillins.  MEDICATIONS:  Current Outpatient Medications  Medication Sig Dispense Refill   dapagliflozin propanediol (FARXIGA) 10 MG TABS tablet Take 1 tablet (10 mg total) by mouth daily. 90 tablet 1   fluticasone (FLONASE) 50 MCG/ACT nasal spray Place 2 sprays into both nostrils daily. 48 g 1   lisinopril (ZESTRIL) 40 MG tablet TAKE 1 TABLET(40 MG) BY MOUTH DAILY 90 tablet 0   meloxicam (  MOBIC) 15 MG tablet Take 15 mg by mouth daily.     metFORMIN (GLUCOPHAGE-XR) 500 MG 24 hr tablet TAKE 2 TABLETS(1000 MG) BY MOUTH TWICE DAILY WITH A MEAL 360 tablet 1   MYRBETRIQ 50 MG TB24 tablet Take 1 tablet (50 mg total) by mouth daily. 30 tablet 11   NEEDLE, DISP, 21 G (BD SAFETYGLIDE SHIELDED NEEDLE) 21G X 1-1/2" MISC Use 21guage needle to inject Testosterone Cypionate 1ml. 25 each 0   omeprazole (PRILOSEC) 20 MG capsule Take 20 mg by mouth daily.     Protein POWD Take 1 Scoop by mouth daily.     testosterone cypionate (DEPOTESTOSTERONE CYPIONATE) 200 MG/ML injection INJECT 0.5 MLS IN THE MUSCLE EVERY WEEK. DISCARD REMAINDER OF VIAL 10 mL 0   atorvastatin (LIPITOR) 40 MG tablet Take 1 tablet (40 mg total) by mouth daily. (Patient not taking: Reported on 07/27/2023) 90 tablet 3   sitaGLIPtin (JANUVIA) 50 MG tablet Take 1 tablet (50 mg total) by mouth daily. (Patient not taking: Reported on 07/27/2023) 30  tablet 2   No current facility-administered medications for this visit.     PHYSICAL EXAMINATION: ECOG PERFORMANCE STATUS: 0 - Asymptomatic Vitals:   07/27/23 1320 07/27/23 1323  BP:  (!) 138/90  Pulse:  (!) 109  Resp: 18 18  SpO2:  96%   Filed Weights   07/27/23 1320  Weight: 232 lb (105.2 kg)    Physical Exam Constitutional:      General: He is not in acute distress. HENT:     Head: Normocephalic and atraumatic.  Eyes:     General: No scleral icterus. Cardiovascular:     Rate and Rhythm: Normal rate.  Pulmonary:     Effort: Pulmonary effort is normal. No respiratory distress.  Abdominal:     General: There is no distension.     Palpations: Abdomen is soft.  Musculoskeletal:        General: Normal range of motion.     Cervical back: Normal range of motion and neck supple.  Skin:    General: Skin is warm and dry.  Neurological:     Mental Status: He is alert and oriented to person, place, and time. Mental status is at baseline.  Psychiatric:        Mood and Affect: Mood normal.     RADIOGRAPHIC STUDIES: I have personally reviewed the radiological images as listed and agreed with the findings in the report. DG Foot Complete Right  Result Date: 07/22/2023 Please see detailed radiograph report in office note.    LABORATORY DATA:  I have reviewed the data as listed    Latest Ref Rng & Units 07/27/2023   12:41 PM 05/01/2023   12:43 PM 03/31/2023    3:56 PM  CBC  WBC 4.0 - 10.5 K/uL 5.0   5.5   Hemoglobin 13.0 - 17.0 g/dL 44.0  10.2  72.5   Hematocrit 39.0 - 52.0 % 48.1  47.8  48.9   Platelets 150 - 400 K/uL 178   229       Latest Ref Rng & Units 03/31/2023    3:56 PM 12/26/2022    8:02 AM 06/02/2022    3:44 PM  CMP  Glucose 70 - 99 mg/dL 366  440  347   BUN 8 - 27 mg/dL 15  16  13    Creatinine 0.76 - 1.27 mg/dL 4.25  9.56  3.87   Sodium 134 - 144 mmol/L 139  139  142   Potassium  3.5 - 5.2 mmol/L 4.1  4.2  4.2   Chloride 96 - 106 mmol/L 103  99  105    CO2 20 - 29 mmol/L 19  21  19    Calcium 8.6 - 10.2 mg/dL 9.4  9.1  9.4   Total Protein 6.0 - 8.5 g/dL 6.2  6.4  6.6   Total Bilirubin 0.0 - 1.2 mg/dL 0.9  1.1  0.7   Alkaline Phos 44 - 121 IU/L 86  89  80   AST 0 - 40 IU/L 20  18  18    ALT 0 - 44 IU/L 24  24  19

## 2023-07-28 ENCOUNTER — Telehealth: Payer: Self-pay | Admitting: Family Medicine

## 2023-07-28 DIAGNOSIS — E1159 Type 2 diabetes mellitus with other circulatory complications: Secondary | ICD-10-CM

## 2023-07-28 DIAGNOSIS — I152 Hypertension secondary to endocrine disorders: Secondary | ICD-10-CM

## 2023-07-28 MED ORDER — LISINOPRIL 40 MG PO TABS
40.0000 mg | ORAL_TABLET | Freq: Every day | ORAL | 1 refills | Status: DC
Start: 2023-07-28 — End: 2024-04-27

## 2023-07-28 NOTE — Telephone Encounter (Signed)
[facility] faxed refill request for the following medications:  lisinopril (ZESTRIL) 40 MG tablet   Please advise.  

## 2023-07-30 ENCOUNTER — Telehealth: Payer: Self-pay | Admitting: Family Medicine

## 2023-07-30 MED ORDER — DAPAGLIFLOZIN PROPANEDIOL 10 MG PO TABS: 10.0000 mg | ORAL_TABLET | Freq: Every day | ORAL | 1 refills | Status: DC

## 2023-07-30 NOTE — Telephone Encounter (Signed)
[facility] faxed refill request for the following medications:   dapagliflozin propanediol (FARXIGA) 10 MG TABS tablet     Please advise.

## 2023-08-13 ENCOUNTER — Telehealth: Payer: Self-pay | Admitting: Family Medicine

## 2023-08-13 NOTE — Telephone Encounter (Signed)
Patient reports he is only using [facility] in [place]

## 2023-08-13 NOTE — Telephone Encounter (Signed)
[facility] for the following medications:   dapagliflozin propanediol (FARXIGA) 10 MG TABS tablet   90 day supply with 3 refills   Please advise

## 2023-08-13 NOTE — Telephone Encounter (Signed)
LRF 07/30/23 #90 1RF No refills placed today

## 2023-08-25 ENCOUNTER — Other Ambulatory Visit: Payer: Self-pay | Admitting: *Deleted

## 2023-08-25 ENCOUNTER — Telehealth: Payer: Self-pay | Admitting: Urology

## 2023-08-25 MED ORDER — GEMTESA 75 MG PO TABS: 75.0000 mg | ORAL_TABLET | Freq: Every day | ORAL | 11 refills | Status: DC

## 2023-08-25 NOTE — Telephone Encounter (Signed)
Pt said he is having issues getting RX filled at [facility] in [place] for Myrbetriq 50 mg.

## 2023-08-25 NOTE — Telephone Encounter (Signed)
Tried to do a prior [place] on Myrbetriq his insurance denise. They give me a list that he could try Gemtesa with no auth needed, I talked to [place] and sent in gemtesa in .

## 2023-08-26 ENCOUNTER — Other Ambulatory Visit: Payer: Self-pay | Admitting: Family Medicine

## 2023-08-26 ENCOUNTER — Telehealth: Payer: Self-pay | Admitting: Family Medicine

## 2023-08-26 NOTE — Telephone Encounter (Signed)
Cover my meds sent a rejected now on Gemtesa. Now they would like patient to try Oxbutynin.

## 2023-08-26 NOTE — Telephone Encounter (Unsigned)
Copied from CRM [id number]. Topic: General - Other >> Aug 26, 2023 12:35 PM Everette C wrote: Reason for CRM: Medication Refill - Medication: dapagliflozin propanediol (FARXIGA) 10 MG TABS tablet [[id number]] - 90 day supply   Has the patient contacted their pharmacy? Yes.   (Agent: If no, request that the patient contact the pharmacy for the refill. If patient does not wish to contact the pharmacy document the reason why and proceed with request.) (Agent: If yes, when and what did the pharmacy advise?)  Preferred Pharmacy (with phone number or street name): [facility] [place] [place] - [address] [place] [address] Phone: [phone] Fax: [phone] Hours: Not open 24 hours   Has the patient been seen for an appointment in the last year OR does the patient have an upcoming appointment? Yes.    Agent: Please be advised that RX refills may take up to 3 business days. We ask that you follow-up with your pharmacy.

## 2023-08-27 MED ORDER — DAPAGLIFLOZIN PROPANEDIOL 10 MG PO TABS: 10.0000 mg | ORAL_TABLET | Freq: Every day | ORAL | 0 refills | Status: DC

## 2023-08-27 NOTE — Telephone Encounter (Signed)
Pt requests change of pharmacy Requested Prescriptions  Pending Prescriptions Disp Refills   dapagliflozin propanediol (FARXIGA) 10 MG TABS tablet 90 tablet 0    Sig: Take 1 tablet (10 mg total) by mouth daily.     Endocrinology:  Diabetes - SGLT2 Inhibitors Failed - 08/26/2023  1:31 PM      Failed - HBA1C is between 0 and 7.9 and within 180 days    Hemoglobin A1C  Date Value Ref Range Status  07/06/2023 8.0 (A) 4.0 - 5.6 % Final  11/26/2018 7.0  Final   Hgb A1c MFr Bld  Date Value Ref Range Status  01/24/2022 8.3 (H) 4.8 - 5.6 % Final    Comment:             Prediabetes: 5.7 - 6.4          Diabetes: >6.4          Glycemic control for adults with diabetes: <7.0    A1c  Date Value Ref Range Status  02/09/2019 7.3  Final         Passed - Cr in normal range and within 360 days    Creatinine, Ser  Date Value Ref Range Status  03/31/2023 0.88 0.76 - 1.27 mg/dL Final         Passed - eGFR in normal range and within 360 days    GFR calc Af Amer  Date Value Ref Range Status  09/28/2020 88 >59 mL/min/1.73 Final    Comment:    **In accordance with recommendations from the NKF-ASN Task force,**   [facility] is in the process of updating its eGFR calculation to the   2021 CKD-EPI creatinine equation that estimates kidney function   without a race variable.    GFR, Estimated  Date Value Ref Range Status  11/18/2021 >60 >60 mL/min Final    Comment:    (NOTE) Calculated using the CKD-EPI Creatinine Equation (2021)    eGFR  Date Value Ref Range Status  03/31/2023 94 >59 mL/min/1.73 Final         Passed - Valid encounter within last 6 months    Recent Outpatient Visits           1 month ago Hypertension associated with diabetes [facility])   [place] [facility] [place], Marzella Schlein, MD   4 months ago Type 2 diabetes mellitus with other specified complication, without long-term current use of insulin [facility])   [place] [facility] [place],  Marzella Schlein, MD   8 months ago Encounter for annual physical exam   [place] [facility] [place], Marzella Schlein, MD   8 months ago Type 2 diabetes mellitus with other specified complication, without long-term current use of insulin [facility])   [place] [facility] [place], Marzella Schlein, MD   1 year ago Type 2 diabetes mellitus with other specified complication, without long-term current use of insulin [facility])   [place] [facility] [place], Darl Householder, DO       Future Appointments             In 1 month [name], Marzella Schlein, MD [facility]   In 4 months [name], Marzella Schlein, MD [facility]   In 5 months Stoioff, Verna Czech, MD [facility] [place]

## 2023-08-28 ENCOUNTER — Other Ambulatory Visit: Payer: Self-pay | Admitting: *Deleted

## 2023-08-28 MED ORDER — OXYBUTYNIN CHLORIDE ER 10 MG PO TB24: 10.0000 mg | ORAL_TABLET | Freq: Every day | ORAL | 1 refills | Status: DC

## 2023-08-28 NOTE — Telephone Encounter (Signed)
Sent in oxbutynin . Will follow up in one month

## 2023-08-28 NOTE — Telephone Encounter (Signed)
Can send in Rx oxybutynin XL 10 mg daily #30/1 refill Let him know this has more side effects than Gemtesa with most common side effects being dry mouth, constipation and possible urinary retention.  Will need a 1 month PA follow-up with bladder scan to make sure he is emptying adequately.

## 2023-08-28 NOTE — Addendum Note (Signed)
Addended by: [name] on: 08/28/2023 12:59 PM   Modules accepted: Orders

## 2023-09-02 ENCOUNTER — Other Ambulatory Visit: Payer: Self-pay | Admitting: Family Medicine

## 2023-09-03 NOTE — Telephone Encounter (Signed)
Requested Prescriptions  Pending Prescriptions Disp Refills   metFORMIN (GLUCOPHAGE-XR) 500 MG 24 hr tablet [Pharmacy Med Name: METFORMIN ER 500MG  24HR TABS] 360 tablet 0    Sig: TAKE 2 TABLETS(1000 MG) BY MOUTH TWICE DAILY WITH A MEAL     Endocrinology:  Diabetes - Biguanides Failed - 09/02/2023  2:10 PM      Failed - HBA1C is between 0 and 7.9 and within 180 days    Hemoglobin A1C  Date Value Ref Range Status  07/06/2023 8.0 (A) 4.0 - 5.6 % Final  11/26/2018 7.0  Final   Hgb A1c MFr Bld  Date Value Ref Range Status  01/24/2022 8.3 (H) 4.8 - 5.6 % Final    Comment:             Prediabetes: 5.7 - 6.4          Diabetes: >6.4          Glycemic control for adults with diabetes: <7.0    A1c  Date Value Ref Range Status  02/09/2019 7.3  Final         Passed - Cr in normal range and within 360 days    Creatinine, Ser  Date Value Ref Range Status  03/31/2023 0.88 0.76 - 1.27 mg/dL Final         Passed - eGFR in normal range and within 360 days    GFR calc Af Amer  Date Value Ref Range Status  09/28/2020 88 >59 mL/min/1.73 Final    Comment:    **In accordance with recommendations from the NKF-ASN Task force,**   [facility] is in the process of updating its eGFR calculation to the   2021 CKD-EPI creatinine equation that estimates kidney function   without a race variable.    GFR, Estimated  Date Value Ref Range Status  11/18/2021 >60 >60 mL/min Final    Comment:    (NOTE) Calculated using the CKD-EPI Creatinine Equation (2021)    eGFR  Date Value Ref Range Status  03/31/2023 94 >59 mL/min/1.73 Final         Passed - B12 Level in normal range and within 720 days    Vitamin B-12  Date Value Ref Range Status  03/31/2023 350 232 - 1,245 pg/mL Final         Passed - Valid encounter within last 6 months    Recent Outpatient Visits           1 month ago Hypertension associated with diabetes [facility])   [place] [facility] [place], Jonathan Schlein, MD   5  months ago Type 2 diabetes mellitus with other specified complication, without long-term current use of insulin [facility])   [place] [facility] [place], Jonathan Schlein, MD   8 months ago Encounter for annual physical exam   [place] [facility] [place], Jonathan Schlein, MD   8 months ago Type 2 diabetes mellitus with other specified complication, without long-term current use of insulin [facility])   [place] [facility] [place], Jonathan Schlein, MD   1 year ago Type 2 diabetes mellitus with other specified complication, without long-term current use of insulin [facility])   [place] [facility] Miranda, Darl Householder, DO       Future Appointments             In 3 weeks McGowan, Elana Alm [facility] [place]   In 1 month [name], Jonathan Schlein, MD [facility], [facility]  In 3 months [name], Jonathan Schlein, MD [facility], [facility]   In 5 months Stoioff, Verna Czech, [facility] [place]            Passed - CBC within normal limits and completed in the last 12 months    WBC  Date Value Ref Range Status  01/26/2023 6.0 4.0 - 10.5 K/uL Final   WBC Count  Date Value Ref Range Status  07/27/2023 5.0 4.0 - 10.5 K/uL Final   RBC  Date Value Ref Range Status  07/27/2023 6.12 (H) 4.22 - 5.81 MIL/uL Final   Hemoglobin  Date Value Ref Range Status  07/27/2023 16.1 13.0 - 17.0 g/dL Final  16/09/9603 54.0 13.0 - 17.7 g/dL Final   HCT  Date Value Ref Range Status  07/27/2023 48.1 39.0 - 52.0 % Final   Hematocrit  Date Value Ref Range Status  03/31/2023 48.9 37.5 - 51.0 % Final   MCHC  Date Value Ref Range Status  07/27/2023 33.5 30.0 - 36.0 g/dL Final   [facility]  Date Value Ref Range Status  07/27/2023 26.3 26.0 - 34.0 pg Final   MCV  Date Value Ref Range Status  07/27/2023 78.6 (L) 80.0 - 100.0 fL Final  03/31/2023 79 79 - 97 fL Final   No results found for:  "PLTCOUNTKUC", "LABPLAT", "POCPLA" RDW  Date Value Ref Range Status  07/27/2023 14.8 11.5 - 15.5 % Final  03/31/2023 15.3 11.6 - 15.4 % Final

## 2023-09-15 ENCOUNTER — Ambulatory Visit (INDEPENDENT_AMBULATORY_CARE_PROVIDER_SITE_OTHER): Payer: 59 | Admitting: Podiatry

## 2023-09-15 ENCOUNTER — Encounter: Payer: Self-pay | Admitting: Podiatry

## 2023-09-15 VITALS — BP 146/91 | HR 92

## 2023-09-15 DIAGNOSIS — M25371 Other instability, right ankle: Secondary | ICD-10-CM

## 2023-09-24 NOTE — Progress Notes (Signed)
09/29/2023 2:35 PM   Jonathan Fields January 03, 1956 [id number]  Referring provider: Erasmo Downer, MD [address] [place],  [place] [address]  Urological history: 1. BPH with LU TS -PSA (01/2023) 1.8 -Oxybutynin XL 10 mg daily  2. ED -Contributing factors of age, BPH, diabetes, hypertension, sleep apnea, hyperlipidemia and obesity  3.  Hypogonadism -Contributing factors of age, diabetes and sleep apnea -Testosterone level (07/2023) 775 -Hemoglobin/hematocrit (07/2023) 16.1/48.1 -Testosterone cypionate 200 mg/mL, 0.5 cc every 7 days  Chief Complaint  Patient presents with   Follow-up   HPI: Jonathan Fields is a 67 y.o. male who presents today for follow up.   Previous records reviewed.   He had been on Myrbetriq for his irritable voiding symptoms, but it did become cost prohibitive.  He was then switched to oxybutynin XL 10 mg daily.  I PSS 9/5  PVR 153 mL   He is having issues with daytime frequency and nocturia.  He is drinking 5 bottles of water daily per his primary care physician's request.  HE states he is taking alfuzosin in the am and oxybutynin at night.     IPSS     Row Name 09/29/23 1400         International Prostate Symptom Score   How often have you had the sensation of not emptying your bladder? Less than 1 in 5     How often have you had to urinate less than every two hours? About half the time     How often have you found you stopped and started again several times when you urinated? Less than 1 in 5 times     How often have you found it difficult to postpone urination? Less than 1 in 5 times     How often have you had a weak urinary stream? Not at All     How often have you had to strain to start urination? Not at All     How many times did you typically get up at night to urinate? 3 Times     Total IPSS Score 9       Quality of Life due to urinary symptoms   If you were to spend the rest of your life with your urinary  condition just the way it is now how would you feel about that? Unhappy              Score:  1-7 Mild 8-19 Moderate 20-35 Severe   PMH: Past Medical History:  Diagnosis Date   BPH (benign prostatic hyperplasia)    Bronchitis    Complication of anesthesia    hard to wake up   DM (diabetes mellitus), type 2 (HCC)    DVT (deep venous thrombosis) (HCC)    post op   ED (erectile dysfunction)    Erythrocytosis 07/04/2020   GERD (gastroesophageal reflux disease)    Hyperlipemia    Hypertension    Hypogonadism in male    Nocturia    Right ankle instability    Sleep apnea    TB lung, latent    Urinary frequency     Surgical History: Past Surgical History:  Procedure Laterality Date   ANKLE ARTHROSCOPY Right 11/13/2021   Procedure: ANKLE ARTHROSCOPY Arthroscopic Irrigation;  Surgeon: Edwin Cap, DPM;  Location: [facility] ORS;  Service: Podiatry;  Laterality: Right;   ANKLE SURGERY Left    torn ligament   CARPAL TUNNEL RELEASE Right 08/2019   COLONOSCOPY  2009   COLONOSCOPY WITH PROPOFOL N/A 01/23/2022   Procedure: COLONOSCOPY WITH PROPOFOL;  Surgeon: Midge Minium, MD;  Location: [facility] ENDOSCOPY;  Service: Endoscopy;  Laterality: N/A;   IRRIGATION AND DEBRIDEMENT ABSCESS Right 11/13/2021   Procedure: IRRIGATION AND DEBRIDEMENT ABSCESS;  Surgeon: Edwin Cap, DPM;  Location: [facility] ORS;  Service: Podiatry;  Laterality: Right;   IRRIGATION AND DEBRIDEMENT FOOT Right 11/16/2021   Procedure: IRRIGATION AND DEBRIDEMENT ANKLE;  Surgeon: Felecia Shelling, DPM;  Location: [facility] ORS;  Service: Podiatry;  Laterality: Right;   KNEE ARTHROSCOPY Left    right ankle ruptured tendon surgery  2008   TRANSURETHRAL RESECTION OF PROSTATE N/A 02/19/2018   Procedure: TRANSURETHRAL RESECTION OF THE PROSTATE (TURP);  Surgeon: Hildred Laser, MD;  Location: [facility] ORS;  Service: Urology;  Laterality: N/A;    Home Medications:  Allergies as of 09/29/2023       Reactions   Penicillins Hives    Has patient had a PCN reaction causing immediate rash, facial/tongue/throat swelling, SOB or lightheadedness with hypotension: yes Has patient had a PCN reaction causing severe rash involving mucus membranes or skin necrosis: no Has patient had a PCN reaction that required hospitalization: no Has patient had a PCN reaction occurring within the last 10 years: no If all of the above answers are "NO", then may proceed with Cephalosporin use.        Medication List        Accurate as of September 29, 2023  2:35 PM. If you have any questions, ask your nurse or doctor.          2-3CC SYRINGE 3 ML Misc 1 mg by Does not apply route every 14 (fourteen) days. Started by: Michiel Cowboy   alfuzosin 10 MG 24 hr tablet Commonly known as: UROXATRAL Take 1 tablet (10 mg total) by mouth daily with breakfast. Started by: Carollee Herter Ernan Runkles   atorvastatin 40 MG tablet Commonly known as: LIPITOR Take 1 tablet (40 mg total) by mouth daily.   BD Disp Needles 18G X 1-1/2" Misc Generic drug: NEEDLE (DISP) 18 G 1 mg by Does not apply route every 14 (fourteen) days. Started by: Michiel Cowboy   BD SafetyGlide Shielded Needle 21G X 1-1/2" Misc Generic drug: NEEDLE (DISP) 21 G Use 21guage needle to inject Testosterone Cypionate 1ml.   dapagliflozin propanediol 10 MG Tabs tablet Commonly known as: FARXIGA Take 1 tablet (10 mg total) by mouth daily.   fluticasone 50 MCG/ACT nasal spray Commonly known as: FLONASE Place 2 sprays into both nostrils daily.   lisinopril 40 MG tablet Commonly known as: ZESTRIL Take 1 tablet (40 mg total) by mouth daily.   meloxicam 15 MG tablet Commonly known as: MOBIC Take 15 mg by mouth daily.   metFORMIN 500 MG 24 hr tablet Commonly known as: GLUCOPHAGE-XR TAKE 2 TABLETS(1000 MG) BY MOUTH TWICE DAILY WITH A MEAL   omeprazole 20 MG capsule Commonly known as: PRILOSEC Take 20 mg by mouth daily.   oxybutynin 10 MG 24 hr tablet Commonly known as:  DITROPAN-XL Take 1 tablet (10 mg total) by mouth daily.   Protein Powd Take 1 Scoop by mouth daily.   sitaGLIPtin 50 MG tablet Commonly known as: Januvia Take 1 tablet (50 mg total) by mouth daily.   testosterone cypionate 200 MG/ML injection Commonly known as: DEPOTESTOSTERONE CYPIONATE INJECT 0.5 MLS IN THE MUSCLE EVERY WEEK. DISCARD REMAINDER OF VIAL        Allergies:  Allergies  Allergen Reactions  Penicillins Hives    Has patient had a PCN reaction causing immediate rash, facial/tongue/throat swelling, SOB or lightheadedness with hypotension: yes Has patient had a PCN reaction causing severe rash involving mucus membranes or skin necrosis: no Has patient had a PCN reaction that required hospitalization: no Has patient had a PCN reaction occurring within the last 10 years: no If all of the above answers are "NO", then may proceed with Cephalosporin use.     Family History: Family History  Problem Relation Age of Onset   Heart disease Father    Hypertension Father    Prostate cancer Father    Heart disease Mother    Breast cancer Mother    Colon cancer Neg Hx     Social History:  reports that he has never smoked. His smokeless tobacco use includes snuff. He reports that he does not currently use alcohol. He reports that he does not use drugs.  ROS: Pertinent ROS in HPI  Physical Exam: BP (!) 146/83   Pulse 98   Wt 232 lb (105.2 kg)   BMI 29.79 kg/m   Constitutional:  Well nourished. Alert and oriented, No acute distress. HEENT: [place] AT, moist mucus membranes.  Trachea midline Cardiovascular: No clubbing, cyanosis, or edema. Respiratory: Normal respiratory effort, no increased work of breathing. GU: No CVA tenderness.  No bladder fullness or masses.  Patient with uncircumcised phallus. Foreskin easily retracted  Urethral meatus is patent.  No penile discharge. No penile lesions or rashes. Scrotum without lesions, cysts, rashes and/or edema.  Testicles are  located scrotally bilaterally. No masses are appreciated in the testicles. Left and right epididymis are normal. Rectal: Patient with  normal sphincter tone. Anus and perineum without scarring or rashes. No rectal masses are appreciated. Prostate is approximately 50 + grams, no nodules are appreciated. Seminal vesicles could not be palpated Neurologic: Grossly intact, no focal deficits, moving all 4 extremities. Psychiatric: Normal mood and affect.  Laboratory Data: Lab Results  Component Value Date   WBC 5.0 07/27/2023   HGB 16.1 07/27/2023   HCT 48.1 07/27/2023   MCV 78.6 (L) 07/27/2023   PLT 178 07/27/2023   Lab Results  Component Value Date   CREATININE 0.88 03/31/2023   Lab Results  Component Value Date   TESTOSTERONE 775 07/22/2023   Lab Results  Component Value Date   HGBA1C 8.0 (A) 07/06/2023   Lab Results  Component Value Date   TSH 2.940 03/31/2023      Component Value Date/Time   CHOL 206 (H) 12/26/2022 0802   HDL 38 (L) 12/26/2022 0802   CHOLHDL 5.4 (H) 12/26/2022 0802   LDLCALC 130 (H) 12/26/2022 0802   Lab Results  Component Value Date   AST 20 03/31/2023   Lab Results  Component Value Date   ALT 24 03/31/2023  I have reviewed the labs.   Pertinent Imaging:  09/29/23 14:07  Scan Result 153 ml    Assessment & Plan:   1. Testosterone deficiency  -testosterone levels are therapeutic -H & H WNL -continue testosterone cypionate 200 mg/milliliters, 0. 5 cc weekly  2. BPH with LUTS -PSA stable -PVR < 300 cc -most bothersome symptoms are frequency and nocturia -continue conservative management, avoiding bladder irritants and timed voiding's -Continue alfuzosin 10 mg daily -Discontinue oxybutynin XL 10 mg daily  Return in about 6 months (around 03/29/2024) for PSA, testosterone (one week after injection) H & H, IPSS, SHIM and exam.  These notes generated with voice recognition software. I apologize  for typographical errors.  Cloretta Ned  [facility] [address]  [address] [place], [place] [address] [phone]

## 2023-09-28 ENCOUNTER — Other Ambulatory Visit: Payer: Self-pay | Admitting: Urology

## 2023-09-28 NOTE — Progress Notes (Signed)
Chief Complaint  Patient presents with   Callouses    "That bunion on my left foot is back.  It hurts."    HPI: 67 y.o. male presenting today for follow-up evaluation of cavovarus foot deformity with ankle instability right lower extremity.  Patient states that his right foot is doing well.  He has developed a symptomatic callus noted to the left foot which is tender.  Presenting for further treatment evaluation  Past Medical History:  Diagnosis Date   BPH (benign prostatic hyperplasia)    Bronchitis    Complication of anesthesia    hard to wake up   DM (diabetes mellitus), type 2 (HCC)    DVT (deep venous thrombosis) (HCC)    post op   ED (erectile dysfunction)    Erythrocytosis 07/04/2020   GERD (gastroesophageal reflux disease)    Hyperlipemia    Hypertension    Hypogonadism in male    Nocturia    Right ankle instability    Sleep apnea    TB lung, latent    Urinary frequency     Past Surgical History:  Procedure Laterality Date   ANKLE ARTHROSCOPY Right 11/13/2021   Procedure: ANKLE ARTHROSCOPY Arthroscopic Irrigation;  Surgeon: Edwin Cap, DPM;  Location: [facility] ORS;  Service: Podiatry;  Laterality: Right;   ANKLE SURGERY Left    torn ligament   CARPAL TUNNEL RELEASE Right 08/2019   COLONOSCOPY  2009   COLONOSCOPY WITH PROPOFOL N/A 01/23/2022   Procedure: COLONOSCOPY WITH PROPOFOL;  Surgeon: Midge Minium, MD;  Location: [facility] ENDOSCOPY;  Service: Endoscopy;  Laterality: N/A;   IRRIGATION AND DEBRIDEMENT ABSCESS Right 11/13/2021   Procedure: IRRIGATION AND DEBRIDEMENT ABSCESS;  Surgeon: Edwin Cap, DPM;  Location: [facility] ORS;  Service: Podiatry;  Laterality: Right;   IRRIGATION AND DEBRIDEMENT FOOT Right 11/16/2021   Procedure: IRRIGATION AND DEBRIDEMENT ANKLE;  Surgeon: Felecia Shelling, DPM;  Location: [facility] ORS;  Service: Podiatry;  Laterality: Right;   KNEE ARTHROSCOPY Left    right ankle ruptured tendon surgery  2008   TRANSURETHRAL RESECTION OF PROSTATE N/A  02/19/2018   Procedure: TRANSURETHRAL RESECTION OF THE PROSTATE (TURP);  Surgeon: Hildred Laser, MD;  Location: [facility] ORS;  Service: Urology;  Laterality: N/A;    Allergies  Allergen Reactions   Penicillins Hives    Has patient had a PCN reaction causing immediate rash, facial/tongue/throat swelling, SOB or lightheadedness with hypotension: yes Has patient had a PCN reaction causing severe rash involving mucus membranes or skin necrosis: no Has patient had a PCN reaction that required hospitalization: no Has patient had a PCN reaction occurring within the last 10 years: no If all of the above answers are "NO", then may proceed with Cephalosporin use.      Physical Exam: General: The patient is alert and oriented x3 in no acute distress.  Dermatology: Skin is warm, dry and supple bilateral lower extremities.  Symptomatic callus noted to the plantar aspect of the left foot with associated tenderness with palpation as well as ambulation.  No open wounds noted  Vascular: Palpable pedal pulses bilaterally. Capillary refill within normal limits.  Negative for any significant edema or erythema  Neurological: Light touch and protective threshold grossly intact  Musculoskeletal Exam: Cavus type deformity noted with rear foot varus and lateral column loading with weightbearing  Radiographic Exam RT foot 01/13/2023:  Cavovarus type foot deformity noted.  Orthopedic hardware noted along the first metatarsal and calcaneus.  No acute fractures identified.  No osseous  erosions that would be concerning for osteomyelitis.  Assessment: 1.  Callus fifth metatarsal tubercle right foot; currently asymptomatic 2.  Ankle joint instability/cavovarus foot type right 3.  T2DM; uncomplicated 4.  Symptomatic callus left foot  Plan of Care:  -Patient evaluated -Patient is doing very well.  Today there is minimal symptomatic callus to the foot.   -The foot is very stable and he wears hightop shoes to  support his ankle and cavovarus foot deformity.  Continue conservative care - Excisional debridement of the hyperkeratotic callus was performed today to the left foot.  Patient felt relief after debridement.  No incident or bleeding noted -Return to clinic as needed       Felecia Shelling, DPM [facility]  Dr. Felecia Shelling, DPM    [address] [place], [place] [address]                Office [phone]  Fax [phone]

## 2023-09-29 ENCOUNTER — Ambulatory Visit (INDEPENDENT_AMBULATORY_CARE_PROVIDER_SITE_OTHER): Payer: 59 | Admitting: Urology

## 2023-09-29 ENCOUNTER — Encounter: Payer: Self-pay | Admitting: Urology

## 2023-09-29 ENCOUNTER — Encounter: Payer: Self-pay | Admitting: Oncology

## 2023-09-29 VITALS — BP 146/83 | HR 98 | Wt 232.0 lb

## 2023-09-29 DIAGNOSIS — E291 Testicular hypofunction: Secondary | ICD-10-CM

## 2023-09-29 DIAGNOSIS — N529 Male erectile dysfunction, unspecified: Secondary | ICD-10-CM

## 2023-09-29 DIAGNOSIS — N401 Enlarged prostate with lower urinary tract symptoms: Secondary | ICD-10-CM

## 2023-09-29 LAB — BLADDER SCAN AMB NON-IMAGING: Scan Result: 153

## 2023-09-29 MED ORDER — SYRINGE 2-3 ML 3 ML MISC
1.0000 mg | 3 refills | Status: AC
Start: 2023-09-29 — End: ?

## 2023-09-29 MED ORDER — ALFUZOSIN HCL ER 10 MG PO TB24: 10.0000 mg | ORAL_TABLET | Freq: Every day | ORAL | 3 refills | Status: DC

## 2023-09-29 MED ORDER — BD SAFETYGLIDE SHIELDED NEEDLE 21G X 1-1/2" MISC
0 refills | Status: AC
Start: 2023-09-29 — End: ?

## 2023-09-29 MED ORDER — BD DISP NEEDLES 18G X 1-1/2" MISC
1.0000 mg | 0 refills | Status: AC
Start: 2023-09-29 — End: ?

## 2023-09-29 MED ORDER — TESTOSTERONE CYPIONATE 200 MG/ML IM SOLN
INTRAMUSCULAR | 0 refills | Status: DC
Start: 2023-09-29 — End: 2024-02-10

## 2023-09-29 NOTE — Patient Instructions (Signed)
Continue the alfuzosin 10 mg daily  Discontinue the oxybutynin XL 10 mg daily

## 2023-10-05 ENCOUNTER — Telehealth: Payer: Self-pay | Admitting: Family Medicine

## 2023-10-05 NOTE — Telephone Encounter (Signed)
Received a fax from covermymeds for Farxiga 10mg   Key: [id number]

## 2023-10-06 ENCOUNTER — Ambulatory Visit (INDEPENDENT_AMBULATORY_CARE_PROVIDER_SITE_OTHER): Payer: PRIVATE HEALTH INSURANCE | Admitting: Family Medicine

## 2023-10-06 ENCOUNTER — Encounter: Payer: Self-pay | Admitting: Family Medicine

## 2023-10-06 VITALS — BP 107/82 | HR 102 | Temp 98.4°F | Ht 74.0 in | Wt 226.0 lb

## 2023-10-06 DIAGNOSIS — I152 Hypertension secondary to endocrine disorders: Secondary | ICD-10-CM

## 2023-10-06 DIAGNOSIS — E1165 Type 2 diabetes mellitus with hyperglycemia: Secondary | ICD-10-CM

## 2023-10-06 DIAGNOSIS — E559 Vitamin D deficiency, unspecified: Secondary | ICD-10-CM

## 2023-10-06 DIAGNOSIS — G5601 Carpal tunnel syndrome, right upper limb: Secondary | ICD-10-CM

## 2023-10-06 DIAGNOSIS — E1169 Type 2 diabetes mellitus with other specified complication: Secondary | ICD-10-CM | POA: Diagnosis not present

## 2023-10-06 DIAGNOSIS — E1159 Type 2 diabetes mellitus with other circulatory complications: Secondary | ICD-10-CM | POA: Diagnosis not present

## 2023-10-06 DIAGNOSIS — Z23 Encounter for immunization: Secondary | ICD-10-CM | POA: Diagnosis not present

## 2023-10-06 DIAGNOSIS — E785 Hyperlipidemia, unspecified: Secondary | ICD-10-CM

## 2023-10-06 DIAGNOSIS — R41840 Attention and concentration deficit: Secondary | ICD-10-CM

## 2023-10-06 DIAGNOSIS — R5383 Other fatigue: Secondary | ICD-10-CM | POA: Diagnosis not present

## 2023-10-06 NOTE — Assessment & Plan Note (Signed)
Previously prescribed Januvia but did not take it due to cost. Jonathan Fields was also unaffordable - gave info to call insurance company. Discussed the use of savings cards for medication affordability. Recommended rechecking A1c levels. - Use Januvia savings card - Recheck A1c levels

## 2023-10-06 NOTE — Telephone Encounter (Signed)
Called patient's plan.  They said no prior authorization needed for Farxiga. They are waiting on patient to call them back before they can process it. Called patient and he asked if the information can be given to him when he comes to his appointment.  Contact [employer] at [phone]. Reference number [id number]

## 2023-10-06 NOTE — Assessment & Plan Note (Signed)
Previously well controlled Continue statin Repeat FLP and CMP  

## 2023-10-06 NOTE — Progress Notes (Signed)
Established Patient Office Visit  Subjective   Patient ID: Jonathan Fields, male    DOB: [birth year]  Age: 68 y.o. MRN: [id number]  Chief Complaint  Patient presents with   Medical Management of Chronic Issues   Hypertension   Diabetes   Hyperlipidemia    HPI  Discussed the use of AI scribe software for clinical note transcription with the patient, who gave verbal consent to proceed.  History of Present Illness   The patient, a 67 year old individual with a history of diabetes, presents with multiple concerns. They report feeling tired and having difficulty focusing. They also express concerns about memory issues, although they are able to perform daily tasks without significant problems. The patient also mentions numbness in the fingers, which has been a lingering issue post carpal tunnel surgery. They have been trying self-remedies such as pressing the ends of the fingers but report no significant improvement. The patient also discusses their struggle with relaxation and stress management, indicating a possible underlying anxiety issue. They express interest in exploring meditation as a potential solution. The patient also mentions being on medication for diabetes but is facing issues with the cost of the medication.         ROS    Objective:     BP 107/82 (BP Location: Right Arm, Patient Position: Sitting, Cuff Size: Large)   Pulse (!) 102   Temp 98.4 F (36.9 C) (Oral)   Ht 6\' 2"  (1.88 m)   Wt 226 lb (102.5 kg)   SpO2 97%   BMI 29.02 kg/m    Physical Exam Vitals reviewed.  Constitutional:      General: He is not in acute distress.    Appearance: Normal appearance. He is not diaphoretic.  HENT:     Head: Normocephalic and atraumatic.  Eyes:     General: No scleral icterus.    Conjunctiva/sclera: Conjunctivae normal.  Cardiovascular:     Rate and Rhythm: Normal rate and regular rhythm.     Heart sounds: Normal heart sounds. No murmur heard. Pulmonary:      Effort: Pulmonary effort is normal. No respiratory distress.     Breath sounds: Normal breath sounds. No wheezing or rhonchi.  Musculoskeletal:     Cervical back: Neck supple.     Right lower leg: No edema.     Left lower leg: No edema.  Lymphadenopathy:     Cervical: No cervical adenopathy.  Skin:    General: Skin is warm and dry.     Findings: No rash.  Neurological:     Mental Status: He is alert and oriented to person, place, and time. Mental status is at baseline.  Psychiatric:        Mood and Affect: Mood normal.        Behavior: Behavior normal.      No results found for any visits on 10/06/23.    The 10-year ASCVD risk score (Arnett DK, et al., 2019) is: 28.2%    Assessment & Plan:   Problem List Items Addressed This Visit       Cardiovascular and Mediastinum   Hypertension associated with diabetes (HCC)    Well controlled Continue current medications Reviewed metabolic panel F/u in 3 months       Relevant Orders   Comprehensive metabolic panel     Endocrine   T2DM (type 2 diabetes mellitus) (HCC) - Primary    Previously prescribed Januvia but did not take it due to cost. Marcelline Deist was also unaffordable -  gave info to call insurance company. Discussed the use of savings cards for medication affordability. Recommended rechecking A1c levels. - Use Januvia savings card - Recheck A1c levels      Relevant Orders   Hemoglobin A1c   Comprehensive metabolic panel   Lipid panel   Hyperlipidemia associated with type 2 diabetes mellitus (HCC)    Previously well controlled Continue statin Repeat FLP and CMP      Relevant Orders   Comprehensive metabolic panel   Lipid panel     Nervous and Auditory   Carpal tunnel syndrome of right wrist   Relevant Orders   Ambulatory referral to Hand Surgery   Other Visit Diagnoses     Encounter for immunization       Relevant Orders   Flu Vaccine Trivalent High Dose (Fluad) (Completed)   Avitaminosis D        Relevant Orders   VITAMIN D 25 Hydroxy (Vit-D Deficiency, Fractures)   Other fatigue       Relevant Orders   B12   Concentration deficit               Fatigue Reports feeling fatigued. Previous B12 levels in April were normal. Consumes meat, so dietary B12 deficiency is unlikely. Considering age-related absorption issues. Discussed rechecking B12 and vitamin D levels. - Recheck B12 levels - Recheck vitamin D levels  Memory and Concentration Issues Reports difficulty focusing and occasional forgetfulness. In-office memory test was normal (30/30). Discussed potential impact of stress and anxiety on concentration. Type A personality and struggles with relaxation. Recommended guided meditation and self-compassion techniques. - Recommend guided meditation - Discuss self-compassion techniques  Post-Carpal Tunnel Syndrome Reports numbness in three fingers of the right hand post carpal tunnel surgery. Previous treatment with gabapentin was ineffective. Referred to hand specialist at [facility] for further evaluation. - Refer to hand specialist at [facility]  General Health Maintenance Routine recheck of cholesterol, kidney, and liver function, and A1c levels. - Recheck cholesterol levels - Recheck kidney function - Recheck liver function - Recheck A1c levels  Follow-up - Schedule physical in three months - Send to lab for blood draw.       Total time spent on today's visit was greater than 40 minutes, including both face-to-face time and nonface-to-face time personally spent on review of chart (labs and imaging), discussing labs and goals, discussing further work-up, treatment options, referrals to specialist, answering patient's questions, and coordinating care.   Return in about 3 months (around 01/06/2024) for CPE.    Shirlee Latch, MD

## 2023-10-06 NOTE — Assessment & Plan Note (Signed)
Well controlled Continue current medications Reviewed metabolic panel F/u in 3 months  

## 2023-10-08 LAB — COMPREHENSIVE METABOLIC PANEL
ALT: 22 IU/L (ref 0–44)
AST: 15 IU/L (ref 0–40)
Albumin: 4.2 g/dL (ref 3.9–4.9)
Alkaline Phosphatase: 91 [IU]/L (ref 44–121)
BUN/Creatinine Ratio: 18 (ref 10–24)
BUN: 23 mg/dL (ref 8–27)
Bilirubin Total: 1.1 mg/dL (ref 0.0–1.2)
CO2: 19 mmol/L — ABNORMAL LOW (ref 20–29)
Calcium: 9.4 mg/dL (ref 8.6–10.2)
Chloride: 100 mmol/L (ref 96–106)
Creatinine, Ser: 1.25 mg/dL (ref 0.76–1.27)
Globulin, Total: 2.6 g/dL (ref 1.5–4.5)
Glucose: 181 mg/dL — ABNORMAL HIGH (ref 70–99)
Potassium: 4.6 mmol/L (ref 3.5–5.2)
Sodium: 137 mmol/L (ref 134–144)
Total Protein: 6.8 g/dL (ref 6.0–8.5)
eGFR: 63 mL/min/{1.73_m2} (ref >59)

## 2023-10-08 LAB — VITAMIN D 25 HYDROXY (VIT D DEFICIENCY, FRACTURES): Vit D, 25-Hydroxy: 28.2 ng/mL — ABNORMAL LOW (ref 30.0–100.0)

## 2023-10-08 LAB — HEMOGLOBIN A1C
Est. average glucose Bld gHb Est-mCnc: 223 mg/dL
Hgb A1c MFr Bld: 9.4 % — ABNORMAL HIGH (ref 4.8–5.6)

## 2023-10-08 LAB — LIPID PANEL
Chol/HDL Ratio: 6.2 ratio — ABNORMAL HIGH (ref 0.0–5.0)
Cholesterol, Total: 241 mg/dL — ABNORMAL HIGH (ref 100–199)
HDL: 39 mg/dL — ABNORMAL LOW (ref >39)
LDL Chol Calc (NIH): 127 mg/dL — ABNORMAL HIGH (ref 0–99)
Triglycerides: 416 mg/dL — ABNORMAL HIGH (ref 0–149)
VLDL Cholesterol Cal: 75 mg/dL — ABNORMAL HIGH (ref 5–40)

## 2023-10-08 LAB — VITAMIN B12: Vitamin B-12: 468 pg/mL (ref 232–1245)

## 2023-10-09 ENCOUNTER — Other Ambulatory Visit: Payer: Self-pay

## 2023-10-09 DIAGNOSIS — Z79899 Other long term (current) drug therapy: Secondary | ICD-10-CM

## 2023-10-12 ENCOUNTER — Telehealth: Payer: Self-pay

## 2023-10-12 NOTE — Progress Notes (Signed)
   Care Guide Note  10/12/2023 Name: [name] MRN: [id number] DOB: [birth year]  Referred by: Erasmo Downer, MD Reason for referral : Care Coordination (Outreach to schedule with Pharm d )   Kaveh Blowers Rhinehart is a 67 y.o. year old male who is a primary care patient of Beryle Flock, Marzella Schlein, MD. Bodyn Robuck Havener was referred to the pharmacist for assistance related to DM.    Successful contact was made with the patient to discuss pharmacy services including being ready for the pharmacist to call at least 5 minutes before the scheduled appointment time, to have medication bottles and any blood sugar or blood pressure readings ready for review. The patient agreed to meet with the pharmacist via with the pharmacist via telephone visit on (date/time).  10/19/2023  Penne Lash, RMA Care Guide [facility]  [place], [place] [address] Direct Dial: [phone] [email or web address]

## 2023-10-19 ENCOUNTER — Other Ambulatory Visit: Payer: Self-pay | Admitting: Pharmacist

## 2023-10-19 NOTE — Progress Notes (Signed)
10/19/2023 Name: Jonathan Fields MRN: [id number] DOB: 23-Jun-1956  Chief Complaint  Patient presents with   Medication Management    Jonathan Fields is a 67 y.o. year old male who presented for a telephone visit.   They were referred to the pharmacist by their PCP for assistance in managing medication access.    Subjective:  Care Team: Primary Care Provider: Erasmo Downer, MD ; Next Scheduled Visit: 01/07/24 Clinical Pharmacist: Marlowe Aschoff, PharmD  Medication Access/Adherence  Current Pharmacy:  [facility] [phone] [address], [place] - [address] AT [facility] OF [address] [place] [address] [place] [address] Phone: [phone] Fax: [phone]  [facility] - [place], [place] - [address] [place] [address] Phone: [phone] Fax: [phone]  [facility] [place], [place] - [address] [place] [address] Phone: [phone] Fax: [phone]   Patient reports affordability concerns with their medications: Yes - Farxiga Patient reports access/transportation concerns to their pharmacy: No  Patient reports adherence concerns with their medications:  Yes - Not taking Atorvastatin   Diabetes:  Current medications: Metformin 500mg  (at max dose of 200mg  daily currently), Farxiga 10mg  (not taking due $$$), Januvi50mg  (not taking due to $$$) Medications tried in the past: Unknown   Patient denies hypoglycemic s/sx including dizziness, shakiness, sweating. Patient denies hyperglycemic symptoms including polyuria, polydipsia, polyphagia, nocturia, neuropathy, blurred vision.  **Main issue of cost concerns with [place] and Januvia today   Current medication access support: [employer] employee insurance under wife (Getting [facility] Advantage plan next month)  Hyperlipidemia/ASCVD Risk Reduction  Current lipid lowering medications: Atorvastatin 40mg - reports not  taking this (no side effects concern either) Medications tried in the past: Unknown  Antiplatelet regimen: None  ASCVD History: None Family History: Father and mother (heart disease) Risk Factors: T2DM    The 10-year ASCVD risk score (Arnett DK, et al., 2019) is: 31.8%   Values used to calculate the score:     Age: 68 years     Sex: Male     Is Non-Hispanic African American: No     Diabetic: Yes     Tobacco smoker: No     Systolic Blood Pressure: 107 mmHg     Is BP treated: Yes     HDL Cholesterol: 39 mg/dL     Total Cholesterol: 241 mg/dL     Objective:  Lab Results  Component Value Date   HGBA1C 9.4 (H) 10/06/2023    Lab Results  Component Value Date   CREATININE 1.25 10/06/2023   BUN 23 10/06/2023   NA 137 10/06/2023   K 4.6 10/06/2023   CL 100 10/06/2023   CO2 19 (L) 10/06/2023    Lab Results  Component Value Date   CHOL 241 (H) 10/06/2023   HDL 39 (L) 10/06/2023   LDLCALC 127 (H) 10/06/2023   TRIG 416 (H) 10/06/2023   CHOLHDL 6.2 (H) 10/06/2023    Medications Reviewed Today     Reviewed by Pollie Friar, RPH (Pharmacist) on 10/19/23 at 1456  Med List Status: <None>   Medication Order Taking? Sig Documenting Provider Last Dose Status Informant  alfuzosin (UROXATRAL) 10 MG 24 hr tablet [id number] Yes Take 1 tablet (10 mg total) by mouth daily with breakfast. Michiel Cowboy A, PA-C Taking Active   atorvastatin (LIPITOR) 40 MG tablet [id number] Yes Take 1 tablet (40 mg total) by mouth  daily. Erasmo Downer, MD Taking Active   celecoxib (CELEBREX) 200 MG capsule [id number] No Take 200 mg by mouth 2 (two) times daily.  Patient not taking: Reported on 10/19/2023   [provider] Not Taking Active   Cholecalciferol (VITAMIN D3) 50 MCG (2000 UT) capsule [id number] Yes Take 2,000 Units by mouth daily. [provider] Taking Active   dapagliflozin propanediol (FARXIGA) 10 MG TABS tablet [id number] Yes Take 1 tablet (10 mg total) by mouth  daily. Erasmo Downer, MD Taking Active   fluticasone [facility]) 50 MCG/ACT nasal spray [id number] Yes Place 2 sprays into both nostrils daily. Erasmo Downer, MD Taking Active   lisinopril (ZESTRIL) 40 MG tablet [id number] Yes Take 1 tablet (40 mg total) by mouth daily. Erasmo Downer, MD Taking Active   meloxicam [facility]) 15 MG tablet [id number] Yes Take 15 mg by mouth daily. [provider] Taking Active Self  metFORMIN (GLUCOPHAGE-XR) 500 MG 24 hr tablet [id number] Yes TAKE 2 TABLETS(1000 MG) BY MOUTH TWICE DAILY WITH A MEAL [name], Marzella Schlein, MD Taking Active   NEEDLE, DISP, 18 G (BD DISP NEEDLES) 18G X 1-1/2" MISC [id number] Yes 1 mg by Does not apply route every 14 (fourteen) days. Harle Battiest, PA-C Taking Active   NEEDLE, DISP, 21 G (BD SAFETYGLIDE SHIELDED NEEDLE) 21G X 1-1/2" MISC [id number] Yes Use 21guage needle to inject Testosterone Cypionate 1ml. Michiel Cowboy A, PA-C Taking Active   omeprazole (PRILOSEC) 20 MG capsule [id number] No Take 20 mg by mouth daily.  Patient not taking: Reported on 10/19/2023   [provider] Not Taking Active Self  oxybutynin (DITROPAN-XL) 10 MG 24 hr tablet [id number] Yes Take 1 tablet (10 mg total) by mouth daily. [name], MD Taking Active   Protein POWD [id number] Yes Take 1 Scoop by mouth daily. [provider] Taking Active Self  sitaGLIPtin (JANUVIA) 50 MG tablet [id number] No Take 1 tablet (50 mg total) by mouth daily.  Patient not taking: Reported on 10/19/2023   Erasmo Downer, MD Not Taking Active   Syringe, Disposable, (2-3CC SYRINGE) 3 ML MISC [id number] Yes 1 mg by Does not apply route every 14 (fourteen) days. Michiel Cowboy A, PA-C Taking Active   testosterone cypionate (DEPOTESTOSTERONE CYPIONATE) 200 MG/ML injection [id number] Yes INJECT 0.5 MLS IN THE MUSCLE EVERY WEEK. DISCARD REMAINDER OF VIAL McGowan, Shannon A, PA-C Taking Active               Assessment/Plan:    Diabetes: - Currently uncontrolled - Reviewed long term cardiovascular and renal outcomes of uncontrolled blood sugar - Reviewed goal A1c, goal fasting, and goal 2 hour post prandial glucose - Recommend to check glucose daily     Hyperlipidemia/ASCVD Risk Reduction: - Currently uncontrolled.  - Reviewed long term complications of uncontrolled cholesterol - Counseling provided on importance of taking Atorvastatin daily due to pancreatitis vs heart attack/stroke at current levels- will work on improving adherence (no side effect concerns)   **Follow Up Plan:**  - Follow-up in 1 month to see if Marcelline Deist + Januvia is covered under new insurance- should be $45 each now (affordable); was about ~$500 for 1 month prior - Could not attach [place] copay card at the pharmacy for this month; when I called [employer] they said it needs approval through some other portion of their company, but can only be processed with patient approval  - Will be getting [facility] Medicare Advantage plan to start 11/01/23- will re-start the medication at that  time and officially start Hali Marry, PharmD [facility] Phone Number: [phone]

## 2023-10-29 ENCOUNTER — Encounter: Payer: Self-pay | Admitting: Oncology

## 2023-11-02 ENCOUNTER — Telehealth: Payer: Self-pay | Admitting: Family Medicine

## 2023-11-02 MED ORDER — METFORMIN HCL ER 500 MG PO TB24: ORAL_TABLET | ORAL | 0 refills | Status: DC

## 2023-11-02 NOTE — Telephone Encounter (Signed)
[facility] faxed refill request for the following medications:  metFORMIN (GLUCOPHAGE-XR) 500 MG 24 hr tablet   90 day supply  Please advise.

## 2023-11-02 NOTE — Telephone Encounter (Signed)
refilled 

## 2023-11-05 ENCOUNTER — Other Ambulatory Visit: Payer: Self-pay | Admitting: Family Medicine

## 2023-11-06 NOTE — Telephone Encounter (Signed)
Requested Prescriptions  Refused Prescriptions Disp Refills   metFORMIN (GLUCOPHAGE-XR) 500 MG 24 hr tablet [Pharmacy Med Name: METFORMIN ER 500MG  24HR TABS] 360 tablet 0    Sig: TAKE 2 TABLETS(1000 MG) BY MOUTH TWICE DAILY WITH A MEAL     Endocrinology:  Diabetes - Biguanides Failed - 11/05/2023  8:09 AM      Failed - HBA1C is between 0 and 7.9 and within 180 days    Hemoglobin A1C  Date Value Ref Range Status  11/26/2018 7.0  Final   Hgb A1c MFr Bld  Date Value Ref Range Status  10/06/2023 9.4 (H) 4.8 - 5.6 % Final    Comment:             Prediabetes: 5.7 - 6.4          Diabetes: >6.4          Glycemic control for adults with diabetes: <7.0    A1c  Date Value Ref Range Status  02/09/2019 7.3  Final         Passed - Cr in normal range and within 360 days    Creatinine, Ser  Date Value Ref Range Status  10/06/2023 1.25 0.76 - 1.27 mg/dL Final         Passed - eGFR in normal range and within 360 days    GFR calc Af Amer  Date Value Ref Range Status  09/28/2020 88 >59 mL/min/1.73 Final    Comment:    **In accordance with recommendations from the NKF-ASN Task force,**   [facility] is in the process of updating its eGFR calculation to the   2021 CKD-EPI creatinine equation that estimates kidney function   without a race variable.    GFR, Estimated  Date Value Ref Range Status  11/18/2021 >60 >60 mL/min Final    Comment:    (NOTE) Calculated using the CKD-EPI Creatinine Equation (2021)    eGFR  Date Value Ref Range Status  10/06/2023 63 >59 mL/min/1.73 Final         Passed - B12 Level in normal range and within 720 days    Vitamin B-12  Date Value Ref Range Status  10/06/2023 468 232 - 1,245 pg/mL Final         Passed - Valid encounter within last 6 months    Recent Outpatient Visits           1 month ago Type 2 diabetes mellitus with hyperglycemia, without long-term current use of insulin (HCC)   [facility] [place], Marzella Schlein,  MD   4 months ago Hypertension associated with diabetes [facility])   [place] [facility] [place], Marzella Schlein, MD   7 months ago Type 2 diabetes mellitus with other specified complication, without long-term current use of insulin [facility])   [name] [place], Marzella Schlein, MD   10 months ago Encounter for annual physical exam    [facility], Marzella Schlein, MD   10 months ago Type 2 diabetes mellitus with other specified complication, without long-term current use of insulin [facility])    [facility] [place], Marzella Schlein, MD       Future Appointments             In 2 months [name], Marzella Schlein, MD [facility]   In 3 months Stoioff, Verna Czech, MD [facility]rology    In 4 months McGowan, Elana Alm [facility]  Urology [place]            Passed - CBC within normal limits and completed in the last 12 months    WBC  Date Value Ref Range Status  01/26/2023 6.0 4.0 - 10.5 K/uL Final   WBC Count  Date Value Ref Range Status  07/27/2023 5.0 4.0 - 10.5 K/uL Final   RBC  Date Value Ref Range Status  07/27/2023 6.12 (H) 4.22 - 5.81 MIL/uL Final   Hemoglobin  Date Value Ref Range Status  07/27/2023 16.1 13.0 - 17.0 g/dL Final  21/30/8657 84.6 13.0 - 17.7 g/dL Final   HCT  Date Value Ref Range Status  07/27/2023 48.1 39.0 - 52.0 % Final   Hematocrit  Date Value Ref Range Status  03/31/2023 48.9 37.5 - 51.0 % Final   MCHC  Date Value Ref Range Status  07/27/2023 33.5 30.0 - 36.0 g/dL Final   [facility]  Date Value Ref Range Status  07/27/2023 26.3 26.0 - 34.0 pg Final   MCV  Date Value Ref Range Status  07/27/2023 78.6 (L) 80.0 - 100.0 fL Final  03/31/2023 79 79 - 97 fL Final   No results found for: "PLTCOUNTKUC", "LABPLAT", "POCPLA" RDW  Date Value Ref Range Status  07/27/2023 14.8 11.5 - 15.5 % Final  03/31/2023 15.3 11.6 - 15.4 %  Final

## 2023-11-11 ENCOUNTER — Encounter: Payer: Self-pay | Admitting: Oncology

## 2023-11-16 ENCOUNTER — Other Ambulatory Visit: Payer: Self-pay | Admitting: Pharmacist

## 2023-11-16 NOTE — Progress Notes (Signed)
11/16/2023 Name: Jonathan Fields MRN: [id number] DOB: 03/04/1956  Chief Complaint  Patient presents with   Medication Management    Jonathan Fields is a 67 y.o. year old male who presented for a telephone visit.   They were referred to the pharmacist by their PCP for assistance in managing medication access.    Subjective:  Care Team: Primary Care Provider: Erasmo Downer, MD ; Next Scheduled Visit: 01/07/24 Clinical Pharmacist: Marlowe Aschoff, PharmD  Medication Access/Adherence  Current Pharmacy:  [facility] [phone], [place] - [address] AT [facility] OF [address] [place] [address] [place] [address] Phone: [phone] Fax: [phone]  [facility] [place], [place] - [address] [place] [address] Phone: [phone] Fax: [phone]   Patient reports affordability concerns with their medications: Yes - Farxiga Patient reports access/transportation concerns to their pharmacy: No  Patient reports adherence concerns with their medications:  Yes - Not taking Atorvastatin   Diabetes:  Current medications: Metformin 500mg  (at max dose of 2000mg  daily), Farxiga 10mg  (just restarted), Januvia 50mg  (not taking due to $$$) Medications tried in the past: Unknown   Patient denies hypoglycemic s/sx including dizziness, shakiness, sweating. Patient denies hyperglycemic symptoms including polyuria, polydipsia, polyphagia, nocturia, neuropathy, blurred vision.  **Main issue of cost concerns with [place] and Januvia today   Current medication access support: [employer] employee insurance under wife (Getting [facility] Advantage plan next month)  Hyperlipidemia/ASCVD Risk Reduction  Current lipid lowering medications: Atorvastatin 40mg - (just restarted) Medications tried in the past: Unknown  Antiplatelet regimen: None  ASCVD History: None Family History: Father and mother (heart disease) Risk Factors:  T2DM    The 10-year ASCVD risk score (Arnett DK, et al., 2019) is: 31.8%   Values used to calculate the score:     Age: 26 years     Sex: Male     Is Non-Hispanic African American: No     Diabetic: Yes     Tobacco smoker: No     Systolic Blood Pressure: 107 mmHg     Is BP treated: Yes     HDL Cholesterol: 39 mg/dL     Total Cholesterol: 241 mg/dL     Objective:  Lab Results  Component Value Date   HGBA1C 9.4 (H) 10/06/2023    Lab Results  Component Value Date   CREATININE 1.25 10/06/2023   BUN 23 10/06/2023   NA 137 10/06/2023   K 4.6 10/06/2023   CL 100 10/06/2023   CO2 19 (L) 10/06/2023    Lab Results  Component Value Date   CHOL 241 (H) 10/06/2023   HDL 39 (L) 10/06/2023   LDLCALC 127 (H) 10/06/2023   TRIG 416 (H) 10/06/2023   CHOLHDL 6.2 (H) 10/06/2023    Medications Reviewed Today   Medications were not reviewed in this encounter       Assessment/Plan:   Diabetes: - Currently uncontrolled - Reviewed long term cardiovascular and renal outcomes of uncontrolled blood sugar - Reviewed goal A1c, goal fasting, and goal 2 hour post prandial glucose - Recommend to check glucose daily   Hyperlipidemia/ASCVD Risk Reduction: - Currently uncontrolled.  - Reviewed long term complications of uncontrolled cholesterol - Counseling provided on importance of taking Atorvastatin daily due to pancreatitis vs heart attack/stroke at current levels- will work on improving adherence (no side effect concerns)   **Follow Up Plan:**  - No follow-up needed as cost concerns are  addressed and that was reason for referral - [facility] insurance started on 11/01/23- was able to restart Marcelline Deist but was having issues with getting the Januvia - Called the pharmacy about Januvia and it was filled- cost of $47 for 30DS; will discuss with PCP if cost concern with medications    Marlowe Aschoff, PharmD [facility] Phone Number: [phone]

## 2023-11-17 ENCOUNTER — Encounter: Payer: Self-pay | Admitting: Oncology

## 2023-11-18 ENCOUNTER — Inpatient Hospital Stay: Payer: Medicare Other

## 2023-11-18 ENCOUNTER — Encounter: Payer: Self-pay | Admitting: Oncology

## 2023-11-18 ENCOUNTER — Inpatient Hospital Stay: Payer: Medicare Other | Attending: Oncology

## 2023-11-18 ENCOUNTER — Inpatient Hospital Stay (HOSPITAL_BASED_OUTPATIENT_CLINIC_OR_DEPARTMENT_OTHER): Payer: Medicare Other | Admitting: Oncology

## 2023-11-18 VITALS — BP 154/94 | HR 94 | Temp 96.0°F | Resp 18 | Wt 233.4 lb

## 2023-11-18 DIAGNOSIS — Z79890 Hormone replacement therapy (postmenopausal): Secondary | ICD-10-CM | POA: Diagnosis not present

## 2023-11-18 DIAGNOSIS — D751 Secondary polycythemia: Secondary | ICD-10-CM | POA: Insufficient documentation

## 2023-11-18 DIAGNOSIS — Z79899 Other long term (current) drug therapy: Secondary | ICD-10-CM | POA: Diagnosis not present

## 2023-11-18 DIAGNOSIS — Z86718 Personal history of other venous thrombosis and embolism: Secondary | ICD-10-CM | POA: Diagnosis not present

## 2023-11-18 LAB — CBC WITH DIFFERENTIAL (CANCER CENTER ONLY)
Abs Immature Granulocytes: 0.05 10*3/uL (ref 0.00–0.07)
Basophils Absolute: 0 10*3/uL (ref 0.0–0.1)
Basophils Relative: 1 %
Eosinophils Absolute: 0.2 10*3/uL (ref 0.0–0.5)
Eosinophils Relative: 3 %
HCT: 50.5 % (ref 39.0–52.0)
Hemoglobin: 16.7 g/dL (ref 13.0–17.0)
Immature Granulocytes: 1 %
Lymphocytes Relative: 22 %
Lymphs Abs: 1.4 10*3/uL (ref 0.7–4.0)
MCH: 27.1 pg (ref 26.0–34.0)
MCHC: 33.1 g/dL (ref 30.0–36.0)
MCV: 82 fL (ref 80.0–100.0)
Monocytes Absolute: 0.8 10*3/uL (ref 0.1–1.0)
Monocytes Relative: 12 %
Neutro Abs: 4 10*3/uL (ref 1.7–7.7)
Neutrophils Relative %: 61 %
Platelet Count: 235 10*3/uL (ref 150–400)
RBC: 6.16 MIL/uL — ABNORMAL HIGH (ref 4.22–5.81)
RDW: 13.2 % (ref 11.5–15.5)
WBC Count: 6.4 10*3/uL (ref 4.0–10.5)
nRBC: 0 % (ref 0.0–0.2)

## 2023-11-18 NOTE — Assessment & Plan Note (Signed)
Follow-up with urology for testosterone replacement management.

## 2023-11-18 NOTE — Progress Notes (Signed)
No phlebotomy today. Pt desires to come on a different day per Lanora Manis, [place]

## 2023-11-18 NOTE — Assessment & Plan Note (Addendum)
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 Labs are reviewed and discussed with patient. Hct 50.5 Will proceed with phlebotomy.

## 2023-11-18 NOTE — Progress Notes (Signed)
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 Labs are reviewed and discussed with patient. Hct 50.5 Will proceed with phlebotomy.   Long-term current use of testosterone replacement therapy Follow-up with urology for testosterone replacement management.   Orders Placed This Encounter  Procedures   CBC with Differential (Cancer Center Only)    Standing Status:   Future    Expected Date:   05/18/2024    Expiration Date:   11/17/2024   Follow up  4 months lab MD +/- Phleb cbc   All questions were answered. The patient knows to call the clinic with any problems, questions or concerns.  [name], MD, PhD [facility] Hematology Oncology 11/18/2023   HISTORY OF PRESENTING ILLNESS:  Jonathan Fields is a 67 y.o. male who was seen in consultation at the request of Beryle Flock, Marzella Schlein, MD for evaluation of polycytosis/erythrocytosis Reviewed patient's recent lab work 03/14/2020, hematocrit 55, 09/13/2019 hematocrit 51.8, 07/22/2019, hematocrit 49.1.  Hemoglobin 17.2.   Patient is on testosterone replacement.  Currently Dr. Lonna Cobb hold off treatments due to erythrocytosis. Patient was is Dr. Bonita Quin to hematology oncology for evaluation and management. Associated signs or symptoms: Patient mention about 15 pounds of unintentional weight loss recently during the past few weeks.  His appetite is unchanged.  He feels that he has eaten similar amount of food recently.  Denies any increase of activity level.  Some sweating for the past 2 days.  Context:  Smoking history: Denies. Testosterone supplements: History of blood clots: Previously he has an episode of provoked thrombosis after surgery. Daytime somnolence: Denies Family history of polycythemia:  Denies  Patient underwent stabilization of right foot complicated by wound dehiscence, septic arthritis of the right ankle, acute osteomyelitis.  Patient was taken to the OR for washout and debridement in December 2022.  Finished antibiotic course.  03/17/2022 for Sclerotherapy for symptomatic varicose veins of the right lower extremity.  INTERVAL HISTORY Jonathan Fields is a 67 y.o. male who has above history reviewed by me today presents for follow up visit for management of secondary erythrocytosis due to testosterone use Patient is on testosterone replacement therapy Denies any arthralgia, fever, chills.  Review of Systems  Constitutional:  Negative for appetite change, chills, fatigue, fever and unexpected weight change.  HENT:   Negative for hearing loss and voice change.   Eyes:  Negative for eye problems and icterus.  Respiratory:  Negative for chest tightness, cough and shortness of breath.   Cardiovascular:  Negative for chest pain and leg swelling.  Gastrointestinal:  Negative for abdominal distention and abdominal pain.  Endocrine: Negative for hot flashes.  Genitourinary:  Negative for difficulty urinating, dysuria and frequency.   Musculoskeletal:  Negative for arthralgias.  Skin:  Negative for itching and rash.  Neurological:  Negative for light-headedness and numbness.  Hematological:  Negative for adenopathy. Does not bruise/bleed easily.  Psychiatric/Behavioral:  Negative for confusion.     MEDICAL HISTORY:  Past Medical History:  Diagnosis Date   BPH (benign prostatic hyperplasia)    Bronchitis    Complication of anesthesia    hard to wake up   DM (diabetes mellitus), type 2 (HCC)    DVT (deep venous thrombosis) (HCC)    post op   ED (erectile dysfunction)    Erythrocytosis 07/04/2020   GERD (gastroesophageal reflux  disease)    Hyperlipemia    Hypertension    Hypogonadism in male    Nocturia    Right ankle instability    Sleep apnea    TB lung, latent     Urinary frequency     SURGICAL HISTORY: Past Surgical History:  Procedure Laterality Date   ANKLE ARTHROSCOPY Right 11/13/2021   Procedure: ANKLE ARTHROSCOPY Arthroscopic Irrigation;  Surgeon: Edwin Cap, DPM;  Location: [facility] ORS;  Service: Podiatry;  Laterality: Right;   ANKLE SURGERY Left    torn ligament   CARPAL TUNNEL RELEASE Right 08/2019   COLONOSCOPY  2009   COLONOSCOPY WITH PROPOFOL N/A 01/23/2022   Procedure: COLONOSCOPY WITH PROPOFOL;  Surgeon: Midge Minium, MD;  Location: [facility] ENDOSCOPY;  Service: Endoscopy;  Laterality: N/A;   IRRIGATION AND DEBRIDEMENT ABSCESS Right 11/13/2021   Procedure: IRRIGATION AND DEBRIDEMENT ABSCESS;  Surgeon: Edwin Cap, DPM;  Location: [facility] ORS;  Service: Podiatry;  Laterality: Right;   IRRIGATION AND DEBRIDEMENT FOOT Right 11/16/2021   Procedure: IRRIGATION AND DEBRIDEMENT ANKLE;  Surgeon: Felecia Shelling, DPM;  Location: [facility] ORS;  Service: Podiatry;  Laterality: Right;   KNEE ARTHROSCOPY Left    right ankle ruptured tendon surgery  2008   TRANSURETHRAL RESECTION OF PROSTATE N/A 02/19/2018   Procedure: TRANSURETHRAL RESECTION OF THE PROSTATE (TURP);  Surgeon: Hildred Laser, MD;  Location: [facility] ORS;  Service: Urology;  Laterality: N/A;    SOCIAL HISTORY: Social History   Socioeconomic History   Marital status: Married    Spouse name: Not on file   Number of children: 0   Years of education: Not on file   Highest education level: Not on file  Occupational History   Not on file  Tobacco Use   Smoking status: Never   Smokeless tobacco: Current    Types: Snuff   Tobacco comments:    tobaco pouches that produce juice, dip occasionally  Vaping Use   Vaping status: Never Used  Substance and Sexual Activity   Alcohol use: Not Currently    Comment: COUPLE OF DRINKS EVERY WEEK   Drug use: No   Sexual activity: Yes    Partners: Female    Birth control/protection: None  Other Topics Concern   Not on file  Social  History Narrative   Not on file   Social Drivers of Health   Financial Resource Strain: Not on file  Food Insecurity: Not on file  Transportation Needs: Not on file  Physical Activity: Not on file  Stress: Not on file  Social Connections: Not on file  Intimate Partner Violence: Not on file    FAMILY HISTORY: Family History  Problem Relation Age of Onset   Heart disease Father    Hypertension Father    Prostate cancer Father    Heart disease Mother    Breast cancer Mother    Colon cancer Neg Hx     ALLERGIES:  is allergic to penicillins.  MEDICATIONS:  Current Outpatient Medications  Medication Sig Dispense Refill   alfuzosin (UROXATRAL) 10 MG 24 hr tablet Take 1 tablet (10 mg total) by mouth daily with breakfast. 90 tablet 3   atorvastatin (LIPITOR) 40 MG tablet Take 1 tablet (40 mg total) by mouth daily. 90 tablet 3   Cholecalciferol (VITAMIN D3) 50 MCG (2000 UT) capsule Take 2,000 Units by mouth daily.     dapagliflozin propanediol (FARXIGA) 10 MG TABS tablet Take 1 tablet (10 mg total) by mouth daily. 90 tablet 0  fluticasone (FLONASE) 50 MCG/ACT nasal spray Place 2 sprays into both nostrils daily. 48 g 1   lisinopril (ZESTRIL) 40 MG tablet Take 1 tablet (40 mg total) by mouth daily. 90 tablet 1   meloxicam (MOBIC) 15 MG tablet Take 15 mg by mouth daily.     metFORMIN (GLUCOPHAGE-XR) 500 MG 24 hr tablet TAKE 2 TABLETS(1000 MG) BY MOUTH TWICE DAILY WITH A MEAL 360 tablet 0   NEEDLE, DISP, 18 G (BD DISP NEEDLES) 18G X 1-1/2" MISC 1 mg by Does not apply route every 14 (fourteen) days. 50 each 0   NEEDLE, DISP, 21 G (BD SAFETYGLIDE SHIELDED NEEDLE) 21G X 1-1/2" MISC Use 21guage needle to inject Testosterone Cypionate 1ml. 25 each 0   oxybutynin (DITROPAN-XL) 10 MG 24 hr tablet Take 1 tablet (10 mg total) by mouth daily. 30 tablet 1   Protein POWD Take 1 Scoop by mouth daily.     Syringe, Disposable, (2-3CC SYRINGE) 3 ML MISC 1 mg by Does not apply route every 14 (fourteen)  days. 25 each 3   testosterone cypionate (DEPOTESTOSTERONE CYPIONATE) 200 MG/ML injection INJECT 0.5 MLS IN THE MUSCLE EVERY WEEK. DISCARD REMAINDER OF VIAL 10 mL 0   celecoxib (CELEBREX) 200 MG capsule Take 200 mg by mouth 2 (two) times daily. (Patient not taking: Reported on 11/18/2023)     omeprazole (PRILOSEC) 20 MG capsule Take 20 mg by mouth daily. (Patient not taking: Reported on 10/19/2023)     sitaGLIPtin (JANUVIA) 50 MG tablet Take 1 tablet (50 mg total) by mouth daily. (Patient not taking: Reported on 11/18/2023) 30 tablet 2   No current facility-administered medications for this visit.     PHYSICAL EXAMINATION: ECOG PERFORMANCE STATUS: 0 - Asymptomatic Vitals:   11/18/23 1441  BP: (!) 154/94  Pulse: 94  Resp: 18  Temp: (!) 96 F (35.6 C)   Filed Weights   11/18/23 1441  Weight: 233 lb 6.4 oz (105.9 kg)    Physical Exam Constitutional:      General: He is not in acute distress. HENT:     Head: Normocephalic and atraumatic.  Eyes:     General: No scleral icterus. Cardiovascular:     Rate and Rhythm: Normal rate.  Pulmonary:     Effort: Pulmonary effort is normal. No respiratory distress.  Abdominal:     General: There is no distension.     Palpations: Abdomen is soft.  Musculoskeletal:        General: Normal range of motion.     Cervical back: Normal range of motion and neck supple.  Skin:    General: Skin is warm and dry.  Neurological:     Mental Status: He is alert and oriented to person, place, and time. Mental status is at baseline.  Psychiatric:        Mood and Affect: Mood normal.     RADIOGRAPHIC STUDIES: I have personally reviewed the radiological images as listed and agreed with the findings in the report. No results found.   LABORATORY DATA:  I have reviewed the data as listed    Latest Ref Rng & Units 11/18/2023    1:41 PM 07/27/2023   12:41 PM 05/01/2023   12:43 PM  CBC  WBC 4.0 - 10.5 K/uL 6.4  5.0    Hemoglobin 13.0 - 17.0 g/dL  11.9  14.7  82.9   Hematocrit 39.0 - 52.0 % 50.5  48.1  47.8   Platelets 150 - 400 K/uL 235  178  Latest Ref Rng & Units 10/06/2023    3:38 PM 03/31/2023    3:56 PM 12/26/2022    8:02 AM  CMP  Glucose 70 - 99 mg/dL 161  096  045   BUN 8 - 27 mg/dL 23  15  16    Creatinine 0.76 - 1.27 mg/dL 4.09  8.11  9.14   Sodium 134 - 144 mmol/L 137  139  139   Potassium 3.5 - 5.2 mmol/L 4.6  4.1  4.2   Chloride 96 - 106 mmol/L 100  103  99   CO2 20 - 29 mmol/L 19  19  21    Calcium 8.6 - 10.2 mg/dL 9.4  9.4  9.1   Total Protein 6.0 - 8.5 g/dL 6.8  6.2  6.4   Total Bilirubin 0.0 - 1.2 mg/dL 1.1  0.9  1.1   Alkaline Phos 44 - 121 IU/L 91  86  89   AST 0 - 40 IU/L 15  20  18    ALT 0 - 44 IU/L 22  24  24

## 2023-11-23 ENCOUNTER — Inpatient Hospital Stay: Payer: Medicare Other

## 2023-11-27 ENCOUNTER — Inpatient Hospital Stay: Payer: Medicare Other

## 2023-11-27 ENCOUNTER — Encounter: Payer: Self-pay | Admitting: Oncology

## 2023-11-27 VITALS — BP 126/84 | HR 93 | Temp 96.6°F

## 2023-11-27 DIAGNOSIS — D751 Secondary polycythemia: Secondary | ICD-10-CM | POA: Diagnosis not present

## 2023-11-27 DIAGNOSIS — Z79899 Other long term (current) drug therapy: Secondary | ICD-10-CM | POA: Diagnosis not present

## 2023-11-27 DIAGNOSIS — Z86718 Personal history of other venous thrombosis and embolism: Secondary | ICD-10-CM | POA: Diagnosis not present

## 2023-11-27 MED ORDER — SODIUM CHLORIDE 0.9 % IV SOLN
Freq: Once | INTRAVENOUS | Status: DC
Start: 2023-11-27 — End: 2023-11-27
  Filled 2023-11-27: qty 250

## 2023-11-28 DIAGNOSIS — L84 Corns and callosities: Secondary | ICD-10-CM | POA: Diagnosis not present

## 2023-12-07 ENCOUNTER — Encounter: Payer: Self-pay | Admitting: Oncology

## 2023-12-08 ENCOUNTER — Ambulatory Visit: Payer: Medicare Other | Admitting: Podiatry

## 2023-12-08 ENCOUNTER — Encounter: Payer: Self-pay | Admitting: Podiatry

## 2023-12-08 DIAGNOSIS — M216X9 Other acquired deformities of unspecified foot: Secondary | ICD-10-CM | POA: Diagnosis not present

## 2023-12-08 DIAGNOSIS — E0843 Diabetes mellitus due to underlying condition with diabetic autonomic (poly)neuropathy: Secondary | ICD-10-CM

## 2023-12-08 DIAGNOSIS — L97522 Non-pressure chronic ulcer of other part of left foot with fat layer exposed: Secondary | ICD-10-CM | POA: Diagnosis not present

## 2023-12-08 NOTE — Progress Notes (Signed)
 Chief Complaint  Patient presents with   Diabetes    I think I have this callus again.  I think I let it get too bad.    Subjective:  68 y.o. male with PMHx of diabetes mellitus presenting today for evaluation of a symptomatic painful callus underlying the fifth MTP of the left foot.  Patient has a long history of cavovarus deformity with lateral column loading.  Over the past month he has noticed increased pain and tenderness associated to the left foot.  Presenting for care   Past Medical History:  Diagnosis Date   BPH (benign prostatic hyperplasia)    Bronchitis    Complication of anesthesia    hard to wake up   DM (diabetes mellitus), type 2 (HCC)    DVT (deep venous thrombosis) (HCC)    post op   ED (erectile dysfunction)    Erythrocytosis 07/04/2020   GERD (gastroesophageal reflux disease)    Hyperlipemia    Hypertension    Hypogonadism in male    Nocturia    Right ankle instability    Sleep apnea    TB lung, latent    Urinary frequency     Past Surgical History:  Procedure Laterality Date   ANKLE ARTHROSCOPY Right 11/13/2021   Procedure: ANKLE ARTHROSCOPY Arthroscopic Irrigation;  Surgeon: Silva Juliene SAUNDERS, DPM;  Location: [facility] OR[facility];  Service: Podiatry;  Laterality: Right;   ANKLE SURGERY Left    torn ligament   CARPAL TUNNEL RELEASE Right 08/2019   COLONOSCOPY  2009   COLONOSCOPY WITH PROPOFOL  N/A 01/23/2022   Procedure: COLONOSCOPY WITH PROPOFOL ;  Surgeon: Jinny Carmine, MD;  Location: [facility] ENDOSCOPY;  Service: Endoscopy;  Laterality: N/A;   IRRIGATION AND DEBRIDEMENT ABSCESS Right 11/13/2021   Procedure: IRRIGATION AND DEBRIDEMENT ABSCESS;  Surgeon: Silva Juliene SAUNDERS, DPM;  Location: [facility] OR[facility];  Service: Podiatry;  Laterality: Right;   IRRIGATION AND DEBRIDEMENT FOOT Right 11/16/2021   Procedure: IRRIGATION AND DEBRIDEMENT ANKLE;  Surgeon: Janit Thresa HERO, DPM;  Location: [facility];  Service: Podiatry;  Laterality: Right;   KNEE ARTHROSCOPY Left    right ankle  ruptured tendon surgery  2008   TRANSURETHRAL RESECTION OF PROSTATE N/A 02/19/2018   Procedure: TRANSURETHRAL RESECTION OF THE PROSTATE (TURP);  Surgeon: Chauncey Redell Agent, MD;  Location: [facility] ORS;  Service: Urology;  Laterality: N/A;    Allergies  Allergen Reactions   Penicillins Hives    Has patient had a PCN reaction causing immediate rash, facial/tongue/throat swelling, SOB or lightheadedness with hypotension: yes Has patient had a PCN reaction causing severe rash involving mucus membranes or skin necrosis: no Has patient had a PCN reaction that required hospitalization: no Has patient had a PCN reaction occurring within the last 10 years: no If all of the above answers are NO, then may proceed with Cephalosporin use.      Objective/Physical Exam General: The patient is alert and oriented x3 in no acute distress.  Dermatology:  Wound #1 noted to the plantar aspect of the fifth MTP measuring approximately 1.3 x 1.3 x 0.2 cm (LxWxD).   To the noted ulceration(s), there is no eschar. There is a moderate amount of slough, fibrin, and necrotic tissue noted. Granulation tissue and wound base is red. There is a minimal amount of serosanguineous drainage noted. There is no exposed bone muscle-tendon ligament or joint. There is no malodor. Periwound integrity is intact. Skin is warm, dry and supple bilateral lower extremities.  Vascular: Palpable pedal pulses bilaterally. No edema  or erythema noted. Capillary refill within normal limits.  Neurological: Grossly intact via light touch  Musculoskeletal Exam: Cavovarus deformity bilateral feet with lateral column loading with weightbearing  Assessment: 1.  Ulcer plantar aspect of the fifth MTP left secondary to diabetes mellitus 2. diabetes mellitus w/ peripheral neuropathy 3. Cavovarus deformity bilateral feet   Plan of Care:  1. Patient was evaluated. 2. medically necessary excisional debridement including subcutaneous tissue was  performed using a tissue nipper and a chisel blade. Excisional debridement of all the necrotic nonviable tissue down to healthy bleeding viable tissue was performed with post-debridement measurements same as pre-. 3. the wound was cleansed and dry sterile dressing applied. 4. patient is to return to clinic in 2 weeks.   Thresa EMERSON Sar, DPM [facility]  Dr. Thresa EMERSON Sar, DPM    [address] [place], [place] [address]                Office [phone]  Fax [phone]

## 2023-12-10 ENCOUNTER — Other Ambulatory Visit: Payer: Self-pay | Admitting: Family Medicine

## 2023-12-16 ENCOUNTER — Encounter: Payer: Self-pay | Admitting: Oncology

## 2023-12-21 DIAGNOSIS — M19012 Primary osteoarthritis, left shoulder: Secondary | ICD-10-CM | POA: Diagnosis not present

## 2023-12-22 ENCOUNTER — Ambulatory Visit: Payer: 59 | Admitting: Podiatry

## 2023-12-22 ENCOUNTER — Encounter: Payer: Self-pay | Admitting: Podiatry

## 2023-12-22 ENCOUNTER — Ambulatory Visit: Payer: Medicare Other | Admitting: Podiatry

## 2023-12-22 DIAGNOSIS — M216X9 Other acquired deformities of unspecified foot: Secondary | ICD-10-CM

## 2023-12-22 NOTE — Progress Notes (Signed)
No chief complaint on file.   Subjective:  68 y.o. male with PMHx of diabetes mellitus presenting today for follow-up evaluation of an ulcer to the plantar aspect of the fifth MTP left foot.  Patient states that he feels significantly better.  He no longer has any pain or tenderness.   Past Medical History:  Diagnosis Date   BPH (benign prostatic hyperplasia)    Bronchitis    Complication of anesthesia    hard to wake up   DM (diabetes mellitus), type 2 (HCC)    DVT (deep venous thrombosis) (HCC)    post op   ED (erectile dysfunction)    Erythrocytosis 07/04/2020   GERD (gastroesophageal reflux disease)    Hyperlipemia    Hypertension    Hypogonadism in male    Nocturia    Right ankle instability    Sleep apnea    TB lung, latent    Urinary frequency     Past Surgical History:  Procedure Laterality Date   ANKLE ARTHROSCOPY Right 11/13/2021   Procedure: ANKLE ARTHROSCOPY Arthroscopic Irrigation;  Surgeon: Edwin Cap, DPM;  Location: [facility] OR[facility];  Service: Podiatry;  Laterality: Right;   ANKLE SURGERY Left    torn ligament   CARPAL TUNNEL RELEASE Right 08/2019   COLONOSCOPY  2009   COLONOSCOPY WITH PROPOFOL N/A 01/23/2022   Procedure: COLONOSCOPY WITH PROPOFOL;  Surgeon: Midge Minium, MD;  Location: [facility] ENDOSCOPY;  Service: Endoscopy;  Laterality: N/A;   IRRIGATION AND DEBRIDEMENT ABSCESS Right 11/13/2021   Procedure: IRRIGATION AND DEBRIDEMENT ABSCESS;  Surgeon: Edwin Cap, DPM;  Location: [facility] OR[facility];  Service: Podiatry;  Laterality: Right;   IRRIGATION AND DEBRIDEMENT FOOT Right 11/16/2021   Procedure: IRRIGATION AND DEBRIDEMENT ANKLE;  Surgeon: Felecia Shelling, DPM;  Location: [facility] OR[facility];  Service: Podiatry;  Laterality: Right;   KNEE ARTHROSCOPY Left    right ankle ruptured tendon surgery  2008   TRANSURETHRAL RESECTION OF PROSTATE N/A 02/19/2018   Procedure: TRANSURETHRAL RESECTION OF THE PROSTATE (TURP);  Surgeon: Hildred Laser, MD;  Location: [facility] OR[facility];   Service: Urology;  Laterality: N/A;    Allergies  Allergen Reactions   Penicillins Hives    Has patient had a PCN reaction causing immediate rash, facial/tongue/throat swelling, SOB or lightheadedness with hypotension: yes Has patient had a PCN reaction causing severe rash involving mucus membranes or skin necrosis: no Has patient had a PCN reaction that required hospitalization: no Has patient had a PCN reaction occurring within the last 10 years: no If all of the above answers are "NO", then may proceed with Cephalosporin use.      Objective/Physical Exam General: The patient is alert and oriented x3 in no acute distress.  Dermatology:  The ulcer to the plantar aspect of the fifth MTP left foot has completely resolved.  Currently there is no open wound.  It feels good without any pain or tenderness with palpation  Vascular: Palpable pedal pulses bilaterally. No edema or erythema noted. Capillary refill within normal limits.  Neurological: Grossly intact via light touch  Musculoskeletal Exam: Cavovarus deformity bilateral feet with lateral column loading with weightbearing  Assessment: 1.  Ulcer plantar aspect of the fifth MTP left secondary to diabetes mellitus 2. diabetes mellitus w/ peripheral neuropathy 3. Cavovarus deformity bilateral feet  4.  PSxHx lateral ankle stabilization procedure 09/05/2021 with secondary infection  Plan of Care:  -Patient was evaluated. -Patient doing very well.  The ulcer to the plantar aspect of the fifth MTP of  the left foot has healed and resolved completely. -Continue wearing good supportive shoes and sneakers.  Advised against going barefoot -Return to clinic for routine footcare    Felecia Shelling, DPM [facility]  Dr. Felecia Shelling, DPM    [address] [place], [place] [address]                Office [phone]  Fax [phone]

## 2023-12-28 ENCOUNTER — Encounter: Payer: BC Managed Care – PPO | Admitting: Family Medicine

## 2023-12-31 DIAGNOSIS — M25511 Pain in right shoulder: Secondary | ICD-10-CM | POA: Diagnosis not present

## 2023-12-31 DIAGNOSIS — M25512 Pain in left shoulder: Secondary | ICD-10-CM | POA: Diagnosis not present

## 2024-01-01 ENCOUNTER — Telehealth: Payer: Self-pay | Admitting: Podiatry

## 2024-01-01 NOTE — Telephone Encounter (Signed)
Pt called and asked if I was nicole and I said no and gave pt my name. He asked if he could be worked in today to be seen by Dr Logan Bores.  I explained that the office closes at 230 and that he also has surgery at 2pm today. So I apologized but could not get him in today. I scheduled him for next Tuesday when Dr Logan Bores is back in [place] office. He said he would call and cxl if it got better.

## 2024-01-05 ENCOUNTER — Ambulatory Visit (INDEPENDENT_AMBULATORY_CARE_PROVIDER_SITE_OTHER): Payer: Medicare Other | Admitting: Podiatry

## 2024-01-05 ENCOUNTER — Encounter: Payer: Self-pay | Admitting: Podiatry

## 2024-01-05 DIAGNOSIS — L97512 Non-pressure chronic ulcer of other part of right foot with fat layer exposed: Secondary | ICD-10-CM | POA: Diagnosis not present

## 2024-01-05 DIAGNOSIS — M216X9 Other acquired deformities of unspecified foot: Secondary | ICD-10-CM

## 2024-01-05 MED ORDER — SILVER SULFADIAZINE 1 % EX CREA: 1.0000 | TOPICAL_CREAM | Freq: Every day | CUTANEOUS | 1 refills | Status: DC

## 2024-01-05 NOTE — Progress Notes (Signed)
 Chief Complaint  Patient presents with   Wound Check    This is my other foot today.  It's a callus on the side of my right foot.    Subjective:  68 y.o. male with PMHx of diabetes mellitus presenting today for follow-up evaluation of an ulcer to the plantar aspect of the fifth MTP left foot.  Patient has noticed a crack along the lateral aspect of the foot.  It is very sensitive and tender to touch and with weightbearing.   Past Medical History:  Diagnosis Date   BPH (benign prostatic hyperplasia)    Bronchitis    Complication of anesthesia    hard to wake up   DM (diabetes mellitus), type 2 (HCC)    DVT (deep venous thrombosis) (HCC)    post op   ED (erectile dysfunction)    Erythrocytosis 07/04/2020   GERD (gastroesophageal reflux disease)    Hyperlipemia    Hypertension    Hypogonadism in male    Nocturia    Right ankle instability    Sleep apnea    TB lung, latent    Urinary frequency     Past Surgical History:  Procedure Laterality Date   ANKLE ARTHROSCOPY Right 11/13/2021   Procedure: ANKLE ARTHROSCOPY Arthroscopic Irrigation;  Surgeon: Silva Juliene SAUNDERS, DPM;  Location: [facility] ORS;  Service: Podiatry;  Laterality: Right;   ANKLE SURGERY Left    torn ligament   CARPAL TUNNEL RELEASE Right 08/2019   COLONOSCOPY  2009   COLONOSCOPY WITH PROPOFOL  N/A 01/23/2022   Procedure: COLONOSCOPY WITH PROPOFOL ;  Surgeon: Jinny Carmine, MD;  Location: [facility] ENDOSCOPY;  Service: Endoscopy;  Laterality: N/A;   IRRIGATION AND DEBRIDEMENT ABSCESS Right 11/13/2021   Procedure: IRRIGATION AND DEBRIDEMENT ABSCESS;  Surgeon: Silva Juliene SAUNDERS, DPM;  Location: [facility] ORS;  Service: Podiatry;  Laterality: Right;   IRRIGATION AND DEBRIDEMENT FOOT Right 11/16/2021   Procedure: IRRIGATION AND DEBRIDEMENT ANKLE;  Surgeon: Janit Thresa HERO, DPM;  Location: [facility] OR[facility];  Service: Podiatry;  Laterality: Right;   KNEE ARTHROSCOPY Left    right ankle ruptured tendon surgery  2008   TRANSURETHRAL RESECTION  OF PROSTATE N/A 02/19/2018   Procedure: TRANSURETHRAL RESECTION OF THE PROSTATE (TURP);  Surgeon: Chauncey Redell Agent, MD;  Location: [facility] ORS;  Service: Urology;  Laterality: N/A;    Allergies  Allergen Reactions   Penicillins Hives    Has patient had a PCN reaction causing immediate rash, facial/tongue/throat swelling, SOB or lightheadedness with hypotension: yes Has patient had a PCN reaction causing severe rash involving mucus membranes or skin necrosis: no Has patient had a PCN reaction that required hospitalization: no Has patient had a PCN reaction occurring within the last 10 years: no If all of the above answers are NO, then may proceed with Cephalosporin use.      Objective/Physical Exam General: The patient is alert and oriented x3 in no acute distress.  Dermatology:  Deep fissure noted along the dorsal aspect of the fifth metatarsal tubercle of the right foot.  Extending into the subcutaneous tissue.  Associated tenderness with palpation.  With debridement the wound/fissure measures approximately 2 cm in length x 0.3 cm in width time 0.2 cm in depth. Granular wound base.  There is some callus tissue around the fissure area  Vascular: Palpable pedal pulses bilaterally. No edema or erythema noted. Capillary refill within normal limits.  Neurological: Grossly intact via light touch  Musculoskeletal Exam: Cavovarus deformity bilateral feet with lateral column loading with weightbearing.  This deformity is what is contributing to the pinching of the skin creating the callus and fissure  Assessment: 1.  Ulcer/left skin lateral aspect of the right foot just cephalad to the fifth metatarsal tubercle 2. diabetes mellitus w/ peripheral neuropathy 3. Cavovarus deformity bilateral feet  4.  PSxHx lateral ankle stabilization procedure 09/05/2021 with secondary infection  Plan of Care:  -Patient was evaluated. - Medically necessary excisional debridement including subcutaneous  tissue was performed today using a tissue nipper.  Excisional debridement of the necrotic nonviable tissue down to healthier bleeding viable tissue was performed with postdebridement measurement same as pre- -Silvadene  cream and a light dressing applied. -Prescription for Silvadene  cream.  Apply twice daily -In regards to the cavovarus deformity of the foot, he continues to wear hightop sneakers which seems to be the best to alleviate his foot pain and create stability -Return to clinic 3 weeks    Thresa EMERSON Sar, DPM [facility]  Dr. Thresa EMERSON Sar, DPM    [address] [place], [place] [address]                Office [phone]  Fax [phone]

## 2024-01-07 ENCOUNTER — Ambulatory Visit (INDEPENDENT_AMBULATORY_CARE_PROVIDER_SITE_OTHER): Payer: Medicare Other | Admitting: Family Medicine

## 2024-01-07 ENCOUNTER — Encounter: Payer: Self-pay | Admitting: Oncology

## 2024-01-07 VITALS — BP 126/88 | HR 88 | Ht 74.0 in | Wt 231.7 lb

## 2024-01-07 DIAGNOSIS — I152 Hypertension secondary to endocrine disorders: Secondary | ICD-10-CM

## 2024-01-07 DIAGNOSIS — E1169 Type 2 diabetes mellitus with other specified complication: Secondary | ICD-10-CM | POA: Diagnosis not present

## 2024-01-07 DIAGNOSIS — E1159 Type 2 diabetes mellitus with other circulatory complications: Secondary | ICD-10-CM | POA: Diagnosis not present

## 2024-01-07 DIAGNOSIS — Z0001 Encounter for general adult medical examination with abnormal findings: Secondary | ICD-10-CM | POA: Diagnosis not present

## 2024-01-07 DIAGNOSIS — Z0000 Encounter for general adult medical examination without abnormal findings: Secondary | ICD-10-CM

## 2024-01-07 DIAGNOSIS — F5101 Primary insomnia: Secondary | ICD-10-CM

## 2024-01-07 DIAGNOSIS — E559 Vitamin D deficiency, unspecified: Secondary | ICD-10-CM | POA: Diagnosis not present

## 2024-01-07 DIAGNOSIS — Z7984 Long term (current) use of oral hypoglycemic drugs: Secondary | ICD-10-CM

## 2024-01-07 DIAGNOSIS — E1165 Type 2 diabetes mellitus with hyperglycemia: Secondary | ICD-10-CM | POA: Diagnosis not present

## 2024-01-07 DIAGNOSIS — E785 Hyperlipidemia, unspecified: Secondary | ICD-10-CM | POA: Diagnosis not present

## 2024-01-07 MED ORDER — GABAPENTIN 300 MG PO CAPS: 300.0000 mg | ORAL_CAPSULE | Freq: Every day | ORAL | 1 refills | Status: DC

## 2024-01-07 NOTE — Progress Notes (Signed)
 Annual Wellness Visit     Patient: Jonathan Fields, Male    DOB: 1956-06-18, 68 y.o.   MRN: [id number] Visit Date: 01/07/2024  Today's Provider: Jon Eva, MD   Chief Complaint  Patient presents with   Annual Exam    Last completed Diet - General well balanced Exercise - 2 to 3 times a week for one hour Feeling - well Sleeping - fairly well Concerns- Medication question   Medication Consultation    Would like to know why he no longer has gabapentin .    Care Management    Diabetic eye Exam - 1 year ago, [facility]   Subjective    Jonathan Fields is a 68 y.o. male who presents today for his Annual Wellness Visit.  Discussed the use of AI scribe software for clinical note transcription with the patient, who gave verbal consent to proceed.  History of Present Illness   The patient, with a history of diabetes, hypertension, and hyperlipidemia, presents with concerns about frequent urination despite being on medication. He reports urinating every couple of hours, which he describes as a lot. The patient is currently on atorvastatin  for cholesterol management, afluozosin for urinary issues, and other medications for diabetes and hypertension. He also mentions a previous prescription for gabapentin , which he believes helped with sleep and nerve pain.  In addition to the urinary issues, the patient also reports ongoing shoulder pain due to a torn rotator cuff. He has been taking Celebrex and meloxicam for the pain but reports no significant relief. The patient mentions a possible upcoming surgery for the shoulder issue.  The patient also mentions a foot ulcer that has been treated by a podiatrist. He reports that the foot is sore and the ulcer is open, causing discomfort when walking.  Lastly, the patient expresses frustration with the cost of his medications, particularly Januvia , which he reports has increased significantly since he started Medicare. He  expresses confusion and frustration with the complexity of Medicare and the lack of clear communication about costs.             Medications: Outpatient Medications Prior to Visit  Medication Sig   alfuzosin  (UROXATRAL ) 10 MG 24 hr tablet Take 1 tablet (10 mg total) by mouth daily with breakfast.   atorvastatin  (LIPITOR) 40 MG tablet Take 1 tablet (40 mg total) by mouth daily.   celecoxib (CELEBREX) 200 MG capsule Take 200 mg by mouth 2 (two) times daily.   Cholecalciferol (VITAMIN D3) 50 MCG (2000 UT) capsule Take 2,000 Units by mouth daily.   dapagliflozin  propanediol (FARXIGA ) 10 MG TABS tablet Take 1 tablet (10 mg total) by mouth daily.   fluticasone  (FLONASE ) 50 MCG/ACT nasal spray SHAKE LIQUID AND USE 2 SPRAYS IN EACH NOSTRIL DAILY   lisinopril  (ZESTRIL ) 40 MG tablet Take 1 tablet (40 mg total) by mouth daily.   meloxicam (MOBIC) 15 MG tablet Take 15 mg by mouth daily.   metFORMIN  (GLUCOPHAGE -XR) 500 MG 24 hr tablet TAKE 2 TABLETS(1000 MG) BY MOUTH TWICE DAILY WITH A MEAL   NEEDLE, DISP, 18 G (BD DISP NEEDLES) 18G X 1-1/2 MISC 1 mg by Does not apply route every 14 (fourteen) days.   NEEDLE, DISP, 21 G (BD SAFETYGLIDE SHIELDED NEEDLE) 21G X 1-1/2 MISC Use 21guage needle to inject Testosterone  Cypionate 1ml.   omeprazole (PRILOSEC) 20 MG capsule Take 20 mg by mouth daily.   Protein POWD Take 1 Scoop by mouth daily.   silver  sulfADIAZINE  (  SILVADENE ) 1 % cream Apply 1 Application topically daily.   sitaGLIPtin  (JANUVIA ) 50 MG tablet Take 1 tablet (50 mg total) by mouth daily.   Syringe, Disposable, (2-3CC SYRINGE) 3 ML MISC 1 mg by Does not apply route every 14 (fourteen) days.   testosterone  cypionate (DEPOTESTOSTERONE CYPIONATE) 200 MG/ML injection INJECT 0.5 MLS IN THE MUSCLE EVERY WEEK. DISCARD REMAINDER OF VIAL   [DISCONTINUED] oxybutynin  (DITROPAN -XL) 10 MG 24 hr tablet Take 1 tablet (10 mg total) by mouth daily.   No facility-administered medications prior to visit.     Allergies  Allergen Reactions   Penicillins Hives    Has patient had a PCN reaction causing immediate rash, facial/tongue/throat swelling, SOB or lightheadedness with hypotension: yes Has patient had a PCN reaction causing severe rash involving mucus membranes or skin necrosis: no Has patient had a PCN reaction that required hospitalization: no Has patient had a PCN reaction occurring within the last 10 years: no If all of the above answers are NO, then may proceed with Cephalosporin use.     Patient Care Team: Myrla Jon HERO, MD as PCP - General (Family Medicine) Babara Call, MD as Consulting Physician (Oncology)  Review of Systems       Objective    Vitals: BP 126/88   Pulse 88   Ht 6' 2 (1.88 m)   Wt 231 lb 11.2 oz (105.1 kg)   SpO2 96%   BMI 29.75 kg/m      Physical Exam Vitals reviewed.  Constitutional:      General: He is not in acute distress.    Appearance: Normal appearance. He is well-developed. He is not diaphoretic.  HENT:     Head: Normocephalic and atraumatic.     Right Ear: Tympanic membrane, ear canal and external ear normal.     Left Ear: Tympanic membrane, ear canal and external ear normal.     Nose: Nose normal.     Mouth/Throat:     Mouth: Mucous membranes are moist.     Pharynx: Oropharynx is clear. No oropharyngeal exudate.  Eyes:     General: No scleral icterus.    Conjunctiva/sclera: Conjunctivae normal.     Pupils: Pupils are equal, round, and reactive to light.  Neck:     Thyroid : No thyromegaly.  Cardiovascular:     Rate and Rhythm: Normal rate and regular rhythm.     Heart sounds: Normal heart sounds. No murmur heard. Pulmonary:     Effort: Pulmonary effort is normal. No respiratory distress.     Breath sounds: Normal breath sounds. No wheezing or rales.  Abdominal:     General: There is no distension.     Palpations: Abdomen is soft.     Tenderness: There is no abdominal tenderness.  Musculoskeletal:        General:  No deformity.     Cervical back: Neck supple.     Right lower leg: No edema.     Left lower leg: No edema.  Lymphadenopathy:     Cervical: No cervical adenopathy.  Skin:    General: Skin is warm and dry.     Findings: No rash.  Neurological:     Mental Status: He is alert and oriented to person, place, and time. Mental status is at baseline.     Gait: Gait normal.  Psychiatric:        Mood and Affect: Mood normal.        Behavior: Behavior normal.  Thought Content: Thought content normal.     Most recent functional status assessment:    01/07/2024    3:02 PM  In your present state of health, do you have any difficulty performing the following activities:  Hearing? 0  Vision? 1  Difficulty concentrating or making decisions? 1  Walking or climbing stairs? 0  Dressing or bathing? 0  Doing errands, shopping? 0  Preparing Food and eating ? N  Using the Toilet? N  In the past six months, have you accidently leaked urine? Y  Do you have problems with loss of bowel control? N  Managing your Medications? N  Managing your Finances? N  Housekeeping or managing your Housekeeping? N   Most recent fall risk assessment:    01/07/2024    3:04 PM  Fall Risk   Falls in the past year? 1  Number falls in past yr: 0  Injury with Fall? 0  Risk for fall due to : History of fall(s)  Follow up Falls evaluation completed    Most recent depression screenings:    01/07/2024    3:08 PM 10/06/2023    3:00 PM  PHQ 2/9 Scores  PHQ - 2 Score 1 1  PHQ- 9 Score  6   Most recent cognitive screening:    01/07/2024    3:05 PM  6CIT Screen  What Year? 0 points  What month? 0 points  What time? 0 points  Count back from 20 0 points  Months in reverse 0 points  Repeat phrase 0 points  Total Score 0 points   Most recent Audit-C alcohol use screening    01/07/2024    5:13 AM  Alcohol Use Disorder Test (AUDIT)  1. How often do you have a drink containing alcohol? 2  2. How many drinks  containing alcohol do you have on a typical day when you are drinking? 0  3. How often do you have six or more drinks on one occasion? 0  AUDIT-C Score 2      Patient-reported   A score of 3 or more in women, and 4 or more in men indicates increased risk for alcohol abuse, EXCEPT if all of the points are from question 1   No results found.  No results found for any visits on 01/07/24.  Assessment & Plan     Annual wellness visit done today including the all of the following: Reviewed patient's Family Medical History Reviewed and updated list of patient's medical providers Assessment of cognitive impairment was done Assessed patient's functional ability Established a written schedule for health screening services Health Risk Assessent Completed and Reviewed  Exercise Activities and Dietary recommendations  Goals   None     Immunization History  Administered Date(s) Administered   Fluad Quad(high Dose 65+) 10/31/2021, 12/29/2022   Fluad Trivalent(High Dose 65+) 10/06/2023   Influenza,inj,Quad PF,6+ Mos 09/09/2019   Influenza-Unspecified 09/14/2020   PFIZER(Purple Top)SARS-COV-2 Vaccination 03/14/2020, 04/04/2020, 01/02/2021   Pneumococcal Conjugate-13 01/20/2016, 09/28/2020   Pneumococcal Polysaccharide-23 10/31/2021   Tdap 02/26/2016   Zoster Recombinant(Shingrix ) 06/05/2017, 12/29/2022    Health Maintenance  Topic Date Due   OPHTHALMOLOGY EXAM  01/02/2023   COVID-19 Vaccine (4 - 2024-25 season) 08/02/2023   Diabetic kidney evaluation - Urine ACR  12/30/2023   HEMOGLOBIN A1C  04/04/2024   Diabetic kidney evaluation - eGFR measurement  10/05/2024   FOOT EXAM  01/06/2025   Medicare Annual Wellness (AWV)  01/06/2025   DTaP/Tdap/Td (2 - Td or Tdap) 02/25/2026  Colonoscopy  01/23/2027   Pneumonia Vaccine 26+ Years old  Completed   INFLUENZA VACCINE  Completed   Hepatitis C Screening  Completed   Zoster Vaccines- Shingrix   Completed   HPV VACCINES  Aged Out      Discussed health benefits of physical activity, and encouraged him to engage in regular exercise appropriate for his age and condition.    Problem List Items Addressed This Visit       Cardiovascular and Mediastinum   Hypertension associated with diabetes (HCC)   Controlled with lisinopril  40 mg daily. Blood pressure was good during the visit. - Continue lisinopril  40 mg daily      Relevant Orders   Basic Metabolic Panel (BMET)   Urine Albumin/Creatinine with ratio (send out) [[id number]]   AMB Referral VBCI Care Management     Endocrine   T2DM (type 2 diabetes mellitus) (HCC)   previously uncontrolled with hyperglycemia.  Chronic condition managed with Farxiga , metformin  XR, and Januvia . Discussed recent issues with medication costs due to Medicare changes. Explained Medicare's new deductible and out-of-pocket maximum policy. Emphasized importance of maintaining A1c under 8 for potential shoulder surgery. - Order A1c test - Refer to pharmacist for medication cost management - Schedule follow-up in 3 months      Relevant Orders   Hemoglobin A1c   AMB Referral VBCI Care Management   Hyperlipidemia associated with type 2 diabetes mellitus (HCC)   Managed with atorvastatin . Cholesterol levels checked in November 2023 are well-controlled. - Continue atorvastatin       Relevant Orders   AMB Referral VBCI Care Management     Other   Primary insomnia   Other Visit Diagnoses       Encounter for annual wellness visit (AWV) in Medicare patient    -  Primary     Encounter for annual physical exam         Avitaminosis D       Relevant Orders   VITAMIN D  25 Hydroxy (Vit-D Deficiency, Fractures)           Chronic Pain Chronic pain due to torn rotator cuff and nerve pain. Currently using Celebrex and meloxicam for arthritis pain. Gabapentin  was previously used for nerve pain and sleep. Discussed potential shoulder surgery due to limited range of motion and daily pain. Surgery  likely needed as conservative treatments are insufficient. - Prescribe gabapentin  300 mg at bedtime - Continue Celebrex and meloxicam (never take together or in the same day) as needed  Urinary Frequency Persistent urinary frequency despite alfuzosin . - Continue alfuzosin   Gastroesophageal Reflux Disease (GERD) Managed with omeprazole. - Continue omeprazole  General Health Maintenance Routine wellness visit. Blood pressure, kidney function, vitamin D , and A1c to be checked. Urine test for diabetes management. Eye exam due in February 2024. - Order blood tests for kidney function, vitamin D , and A1c - Order urine test - Ensure annual eye exam is scheduled  Follow-up - Schedule follow-up appointment in 3 months.        Return in about 3 months (around 04/05/2024) for chronic disease f/u.     Jon Eva, MD  [facility] [phone] (phone) [phone] (fax)  [facility]

## 2024-01-07 NOTE — Assessment & Plan Note (Signed)
 Controlled with lisinopril  40 mg daily. Blood pressure was good during the visit. - Continue lisinopril  40 mg daily

## 2024-01-07 NOTE — Assessment & Plan Note (Signed)
 previously uncontrolled with hyperglycemia.  Chronic condition managed with Farxiga , metformin  XR, and Januvia . Discussed recent issues with medication costs due to Medicare changes. Explained Medicare's new deductible and out-of-pocket maximum policy. Emphasized importance of maintaining A1c under 8 for potential shoulder surgery. - Order A1c test - Refer to pharmacist for medication cost management - Schedule follow-up in 3 months

## 2024-01-07 NOTE — Assessment & Plan Note (Signed)
 Managed with atorvastatin . Cholesterol levels checked in November 2023 are well-controlled. - Continue atorvastatin 

## 2024-01-08 ENCOUNTER — Encounter: Payer: Self-pay | Admitting: Family Medicine

## 2024-01-08 LAB — BASIC METABOLIC PANEL
BUN/Creatinine Ratio: 12 (ref 10–24)
BUN: 13 mg/dL (ref 8–27)
CO2: 24 mmol/L (ref 20–29)
Calcium: 9.6 mg/dL (ref 8.6–10.2)
Chloride: 104 mmol/L (ref 96–106)
Creatinine, Ser: 1.06 mg/dL (ref 0.76–1.27)
Glucose: 158 mg/dL — ABNORMAL HIGH (ref 70–99)
Potassium: 4.4 mmol/L (ref 3.5–5.2)
Sodium: 143 mmol/L (ref 134–144)
eGFR: 76 mL/min/{1.73_m2} (ref >59)

## 2024-01-08 LAB — HEMOGLOBIN A1C
Est. average glucose Bld gHb Est-mCnc: 206 mg/dL
Hgb A1c MFr Bld: 8.8 % — ABNORMAL HIGH (ref 4.8–5.6)

## 2024-01-08 LAB — VITAMIN D 25 HYDROXY (VIT D DEFICIENCY, FRACTURES): Vit D, 25-Hydroxy: 46.8 ng/mL (ref 30.0–100.0)

## 2024-01-08 LAB — MICROALBUMIN / CREATININE URINE RATIO
Creatinine, Urine: 79.7 mg/dL
Microalb/Creat Ratio: 13 mg/g{creat} (ref 0–29)
Microalbumin, Urine: 10.2 ug/mL

## 2024-01-13 ENCOUNTER — Telehealth: Payer: Self-pay

## 2024-01-13 NOTE — Progress Notes (Signed)
Care Guide Pharmacy Note  01/13/2024 Name: Jonathan Fields MRN: [id number] DOB: [birth year]  Referred By: Erasmo Downer, MD Reason for referral: Care Coordination (Outreach to schedule with Pharm d )   Jonathan Fields is a 68 y.o. year old male who is a primary care patient of Erasmo Downer, MD.  Jonathan Fields was referred to the pharmacist for assistance related to: HTN and DMII  Successful contact was made with the patient to discuss pharmacy services including being ready for the pharmacist to call at least 5 minutes before the scheduled appointment time and to have medication bottles and any blood pressure readings ready for review. The patient agreed to meet with the pharmacist via telephone visit on (date/time).02/04/2024  Penne Lash , RMA     [place]  [facility], [facility] Guide  Direct Dial: [phone]  Website: [email or web address]

## 2024-01-15 ENCOUNTER — Ambulatory Visit (INDEPENDENT_AMBULATORY_CARE_PROVIDER_SITE_OTHER): Payer: Medicare Other | Admitting: Physician Assistant

## 2024-01-15 ENCOUNTER — Encounter: Payer: Self-pay | Admitting: Physician Assistant

## 2024-01-15 VITALS — BP 135/94 | HR 102 | Temp 99.2°F | Ht 74.0 in | Wt 225.0 lb

## 2024-01-15 DIAGNOSIS — R509 Fever, unspecified: Secondary | ICD-10-CM

## 2024-01-15 LAB — POC SOFIA 2 FLU + SARS ANTIGEN FIA
Influenza A, POC: POSITIVE — AB
Influenza B, POC: NEGATIVE
SARS Coronavirus 2 Ag: NEGATIVE

## 2024-01-15 LAB — POCT RAPID STREP A (OFFICE): Rapid Strep A Screen: NEGATIVE

## 2024-01-16 NOTE — Progress Notes (Signed)
Established patient visit  Patient: Jonathan Fields   DOB: [birth year]   68 y.o. Male  MRN: [id number] Visit Date: 01/15/2024  Today's healthcare provider: Debera Lat, PA-C   Chief Complaint  Patient presents with   URI    Started about 4 days ago, with cough and congestion   Subjective      Discussed the use of AI scribe software for clinical note transcription with the patient, who gave verbal consent to proceed.  History of Present Illness   The patient, with a history of sleep apnea and acid reflux, presents with symptoms of congestion and cough for the past four days. The congestion is severe enough to disrupt his sleep. The patient describes the congestion as being in his head and chest, and he has been using over-the-counter medication for relief. The patient has not experienced any fever, ear problems, or eye drainage. He has been using Flonase nasal spray and taking Tylenol for symptom management. The patient has not been taking antihistamines or Mucinex. He has been drinking water regularly. The patient has not been smoking. The patient also has a history of heartburn, for which he takes Prilosec daily.           01/07/2024    3:08 PM 10/06/2023    3:00 PM 07/06/2023    2:50 PM  Depression screen PHQ 2/9  Decreased Interest 1 1 1   Down, Depressed, Hopeless 0 0 0  PHQ - 2 Score 1 1 1   Altered sleeping  1 0  Tired, decreased energy  2 1  Change in appetite  0 0  Feeling bad or failure about yourself   0 0  Trouble concentrating  1 1  Moving slowly or fidgety/restless  1 0  Suicidal thoughts  0 0  PHQ-9 Score  6 3  Difficult doing work/chores  Not difficult at all Not difficult at all      01/07/2024    3:08 PM 10/06/2023    3:00 PM  GAD 7 : Generalized Anxiety Score  Nervous, Anxious, on Edge 1 0  Control/stop worrying 0 0  Worry too much - different things 0 0  Trouble relaxing 1 2  Restless 1 0  Easily annoyed or irritable 1 1  Afraid - awful might happen 0  0  Total GAD 7 Score 4 3  Anxiety Difficulty Not difficult at all Not difficult at all    Medications: Outpatient Medications Prior to Visit  Medication Sig   alfuzosin (UROXATRAL) 10 MG 24 hr tablet Take 1 tablet (10 mg total) by mouth daily with breakfast.   atorvastatin (LIPITOR) 40 MG tablet Take 1 tablet (40 mg total) by mouth daily.   celecoxib (CELEBREX) 200 MG capsule Take 200 mg by mouth 2 (two) times daily.   Cholecalciferol (VITAMIN D3) 50 MCG (2000 UT) capsule Take 2,000 Units by mouth daily.   dapagliflozin propanediol (FARXIGA) 10 MG TABS tablet Take 1 tablet (10 mg total) by mouth daily.   fluticasone (FLONASE) 50 MCG/ACT nasal spray SHAKE LIQUID AND USE 2 SPRAYS IN EACH NOSTRIL DAILY   gabapentin (NEURONTIN) 300 MG capsule Take 1 capsule (300 mg total) by mouth at bedtime.   lisinopril (ZESTRIL) 40 MG tablet Take 1 tablet (40 mg total) by mouth daily.   meloxicam (MOBIC) 15 MG tablet Take 15 mg by mouth daily.   metFORMIN (GLUCOPHAGE-XR) 500 MG 24 hr tablet TAKE 2 TABLETS(1000 MG) BY MOUTH TWICE DAILY WITH A MEAL   NEEDLE, DISP, 18  G (BD DISP NEEDLES) 18G X 1-1/2" MISC 1 mg by Does not apply route every 14 (fourteen) days.   NEEDLE, DISP, 21 G (BD SAFETYGLIDE SHIELDED NEEDLE) 21G X 1-1/2" MISC Use 21guage needle to inject Testosterone Cypionate 1ml.   omeprazole (PRILOSEC) 20 MG capsule Take 20 mg by mouth daily.   Protein POWD Take 1 Scoop by mouth daily.   silver sulfADIAZINE (SILVADENE) 1 % cream Apply 1 Application topically daily.   sitaGLIPtin (JANUVIA) 50 MG tablet Take 1 tablet (50 mg total) by mouth daily.   Syringe, Disposable, (2-3CC SYRINGE) 3 ML MISC 1 mg by Does not apply route every 14 (fourteen) days.   testosterone cypionate (DEPOTESTOSTERONE CYPIONATE) 200 MG/ML injection INJECT 0.5 MLS IN THE MUSCLE EVERY WEEK. DISCARD REMAINDER OF VIAL   No facility-administered medications prior to visit.    Review of Systems All negative Except see HPI        Objective    BP (!) 135/94   Pulse (!) 102   Temp 99.2 F (37.3 C) (Oral)   Ht 6\' 2"  (1.88 m)   Wt 225 lb (102.1 kg)   BMI 28.89 kg/m     Physical Exam Vitals reviewed.  Constitutional:      General: He is in acute distress.     Appearance: Normal appearance. He is not ill-appearing, toxic-appearing or diaphoretic.  HENT:     Head: Normocephalic and atraumatic.     Right Ear: Ear canal and external ear normal. There is impacted cerumen (partially).     Left Ear: Ear canal and external ear normal. There is impacted cerumen (partuially).     Nose: Congestion and rhinorrhea present.     Mouth/Throat:     Pharynx: Posterior oropharyngeal erythema present.  Eyes:     General: No scleral icterus.       Right eye: No discharge.        Left eye: No discharge.     Extraocular Movements: Extraocular movements intact.     Conjunctiva/sclera: Conjunctivae normal.     Pupils: Pupils are equal, round, and reactive to light.  Cardiovascular:     Rate and Rhythm: Normal rate and regular rhythm.     Pulses: Normal pulses.     Heart sounds: Normal heart sounds. No murmur heard. Pulmonary:     Effort: Pulmonary effort is normal. No respiratory distress.     Breath sounds: Normal breath sounds. No wheezing or rhonchi.  Abdominal:     General: Abdomen is flat. Bowel sounds are normal.     Palpations: Abdomen is soft.  Musculoskeletal:        General: Normal range of motion.     Cervical back: Normal range of motion and neck supple.     Right lower leg: No edema.     Left lower leg: No edema.  Lymphadenopathy:     Cervical: No cervical adenopathy.  Skin:    General: Skin is warm and dry.     Findings: No rash.  Neurological:     General: No focal deficit present.     Mental Status: He is alert and oriented to person, place, and time. Mental status is at baseline.  Psychiatric:        Behavior: Behavior normal.        Thought Content: Thought content normal.      Results for  orders placed or performed in visit on 01/15/24  POCT rapid strep A  Result Value Ref Range   Rapid  Strep A Screen Negative Negative  POC SOFIA 2 FLU + SARS ANTIGEN FIA  Result Value Ref Range   Influenza A, POC Positive (A) Negative   Influenza B, POC Negative Negative   SARS Coronavirus 2 Ag Negative Negative        Assessment and Plan    Influenza X4 - 5 days Patient presents with symptoms of congestion, cough, and wheezing for the past four days. No fever reported. Flu test positive. -Symptomatic treatment recommended due to late presentation. -Advise patient to self-isolate due to contagious nature of the flu.  Nasal examination shows clear passages. -Start nasal saline rinse before using Flonase. -Continue Flonase as previously prescribed. -Consider Zyrtec to help with symptoms.  Gastroesophageal Reflux Disease (GERD) Patient reports taking Prilosec daily for heartburn. -Continue Prilosec as prescribed.  Sleep Problems Patient reports difficulty sleeping due to congestion. -Continue current treatment for sleep apnea. -Address congestion to improve sleep quality, see above  Follow-up Monitor symptoms and if he worsens or persists for more than ten days, consider antibiotics for possible bacterial infection.      Orders Placed This Encounter  Procedures   POCT rapid strep A    Associate with J02.9   POC SOFIA 2 FLU + SARS ANTIGEN FIA    No follow-ups on file.   The patient was advised to call back or seek an in-person evaluation if the symptoms worsen or if the condition fails to improve as anticipated.  I discussed the assessment and treatment plan with the patient. The patient was provided an opportunity to ask questions and all were answered. The patient agreed with the plan and demonstrated an understanding of the instructions.  I, Debera Lat, PA-C have reviewed all documentation for this visit. The documentation on 01/15/2024  for the exam, diagnosis,  procedures, and orders are all accurate and complete.  Debera Lat, [facility], MMS [facility] [phone] (phone) [phone] (fax)  [facility]

## 2024-01-19 DIAGNOSIS — M25512 Pain in left shoulder: Secondary | ICD-10-CM | POA: Diagnosis not present

## 2024-01-19 DIAGNOSIS — M19012 Primary osteoarthritis, left shoulder: Secondary | ICD-10-CM | POA: Diagnosis not present

## 2024-01-22 DIAGNOSIS — J0100 Acute maxillary sinusitis, unspecified: Secondary | ICD-10-CM | POA: Diagnosis not present

## 2024-01-25 ENCOUNTER — Other Ambulatory Visit: Payer: Self-pay | Admitting: Orthopaedic Surgery

## 2024-01-25 DIAGNOSIS — M25512 Pain in left shoulder: Secondary | ICD-10-CM

## 2024-01-26 ENCOUNTER — Ambulatory Visit (INDEPENDENT_AMBULATORY_CARE_PROVIDER_SITE_OTHER): Payer: Medicare Other | Admitting: Podiatry

## 2024-01-26 ENCOUNTER — Encounter: Payer: Self-pay | Admitting: Podiatry

## 2024-01-26 DIAGNOSIS — M216X9 Other acquired deformities of unspecified foot: Secondary | ICD-10-CM

## 2024-01-26 DIAGNOSIS — L97512 Non-pressure chronic ulcer of other part of right foot with fat layer exposed: Secondary | ICD-10-CM | POA: Diagnosis not present

## 2024-01-26 NOTE — Progress Notes (Signed)
 Chief Complaint  Patient presents with   Diabetic Ulcer    "It's still pretty sore but I think it's starting to come together."    Subjective:  68 y.o. male with PMHx of diabetes mellitus presenting today for follow-up evaluation of a    Past Medical History:  Diagnosis Date   Allergy    Anxiety    Arthritis    BPH (benign prostatic hyperplasia)    Bronchitis    Complication of anesthesia    hard to wake up   DM (diabetes mellitus), type 2 (HCC)    DVT (deep venous thrombosis) (HCC)    post op   ED (erectile dysfunction)    Erythrocytosis 07/04/2020   GERD (gastroesophageal reflux disease)    Hyperlipemia    Hypertension    Hypogonadism in male    Nocturia    Right ankle instability    Sleep apnea    TB lung, latent    Urinary frequency     Past Surgical History:  Procedure Laterality Date   ANKLE ARTHROSCOPY Right 11/13/2021   Procedure: ANKLE ARTHROSCOPY Arthroscopic Irrigation;  Surgeon: Edwin Cap, DPM;  Location: [facility] ORS;  Service: Podiatry;  Laterality: Right;   ANKLE SURGERY Left    torn ligament   CARPAL TUNNEL RELEASE Right 08/2019   COLONOSCOPY  2009   COLONOSCOPY WITH PROPOFOL N/A 01/23/2022   Procedure: COLONOSCOPY WITH PROPOFOL;  Surgeon: Midge Minium, MD;  Location: [facility] ENDOSCOPY;  Service: Endoscopy;  Laterality: N/A;   HERNIA REPAIR     IRRIGATION AND DEBRIDEMENT ABSCESS Right 11/13/2021   Procedure: IRRIGATION AND DEBRIDEMENT ABSCESS;  Surgeon: Edwin Cap, DPM;  Location: [facility] ORS;  Service: Podiatry;  Laterality: Right;   IRRIGATION AND DEBRIDEMENT FOOT Right 11/16/2021   Procedure: IRRIGATION AND DEBRIDEMENT ANKLE;  Surgeon: Felecia Shelling, DPM;  Location: [facility] ORS;  Service: Podiatry;  Laterality: Right;   KNEE ARTHROSCOPY Left    right ankle ruptured tendon surgery  2008   TRANSURETHRAL RESECTION OF PROSTATE N/A 02/19/2018   Procedure: TRANSURETHRAL RESECTION OF THE PROSTATE (TURP);  Surgeon: Hildred Laser, MD;   Location: [facility] ORS;  Service: Urology;  Laterality: N/A;    Allergies  Allergen Reactions   Penicillins Hives    Has patient had a PCN reaction causing immediate rash, facial/tongue/throat swelling, SOB or lightheadedness with hypotension: yes Has patient had a PCN reaction causing severe rash involving mucus membranes or skin necrosis: no Has patient had a PCN reaction that required hospitalization: no Has patient had a PCN reaction occurring within the last 10 years: no If all of the above answers are "NO", then may proceed with Cephalosporin use.      Objective/Physical Exam General: The patient is alert and oriented x3 in no acute distress.  Dermatology:  Deep fissure noted along the dorsal aspect of the fifth metatarsal tubercle of the right foot.  Extending into the subcutaneous tissue.  Associated tenderness with palpation.  With debridement the wound/fissure measures approximately 2 cm in length x 0.3 cm in width time 0.2 cm in depth. Granular wound base.  There is some callus tissue around the fissure area  Vascular: Palpable pedal pulses bilaterally. No edema or erythema noted. Capillary refill within normal limits.  Neurological: Grossly intact via light touch  Musculoskeletal Exam: Cavovarus deformity bilateral feet with lateral column loading with weightbearing.  This deformity is what is contributing to the pinching of the skin creating the callus and fissure  Assessment: 1.  Ulcer/left  skin lateral aspect of the right foot just cephalad to the fifth metatarsal tubercle 2. diabetes mellitus w/ peripheral neuropathy 3. Cavovarus deformity bilateral feet  4.  PSxHx lateral ankle stabilization procedure 09/05/2021 with secondary infection  Plan of Care:  -Patient was evaluated. - Medically necessary excisional debridement including subcutaneous tissue was performed today using a tissue nipper.  Excisional debridement of the necrotic nonviable tissue down to healthier  bleeding viable tissue was performed with postdebridement measurement same as pre- -Silvadene cream and a light dressing applied. -Prescription for Silvadene cream.  Apply twice daily -In regards to the cavovarus deformity of the foot, he continues to wear hightop sneakers which seems to be the best to alleviate his foot pain and create stability -Return to clinic 3 weeks    Felecia Shelling, DPM [facility]  Dr. Felecia Shelling, DPM    [address] [place], [place] [address]                Office [phone]  Fax [phone]

## 2024-01-29 ENCOUNTER — Ambulatory Visit: Payer: Medicare Other

## 2024-02-01 ENCOUNTER — Encounter: Payer: Self-pay | Admitting: Oncology

## 2024-02-01 ENCOUNTER — Ambulatory Visit: Payer: BC Managed Care – PPO | Admitting: Urology

## 2024-02-01 ENCOUNTER — Encounter: Payer: Self-pay | Admitting: Family Medicine

## 2024-02-01 ENCOUNTER — Ambulatory Visit (INDEPENDENT_AMBULATORY_CARE_PROVIDER_SITE_OTHER): Payer: Medicare Other | Admitting: Family Medicine

## 2024-02-01 VITALS — BP 122/85 | HR 100 | Ht 74.0 in | Wt 232.5 lb

## 2024-02-01 DIAGNOSIS — Z7984 Long term (current) use of oral hypoglycemic drugs: Secondary | ICD-10-CM | POA: Diagnosis not present

## 2024-02-01 DIAGNOSIS — E1165 Type 2 diabetes mellitus with hyperglycemia: Secondary | ICD-10-CM | POA: Diagnosis not present

## 2024-02-01 DIAGNOSIS — Z01818 Encounter for other preprocedural examination: Secondary | ICD-10-CM | POA: Diagnosis not present

## 2024-02-01 DIAGNOSIS — E1159 Type 2 diabetes mellitus with other circulatory complications: Secondary | ICD-10-CM | POA: Diagnosis not present

## 2024-02-01 DIAGNOSIS — E1169 Type 2 diabetes mellitus with other specified complication: Secondary | ICD-10-CM | POA: Diagnosis not present

## 2024-02-01 DIAGNOSIS — I152 Hypertension secondary to endocrine disorders: Secondary | ICD-10-CM | POA: Diagnosis not present

## 2024-02-01 DIAGNOSIS — G4730 Sleep apnea, unspecified: Secondary | ICD-10-CM | POA: Diagnosis not present

## 2024-02-01 DIAGNOSIS — E785 Hyperlipidemia, unspecified: Secondary | ICD-10-CM

## 2024-02-01 NOTE — Progress Notes (Unsigned)
 Established patient visit   Patient: Jonathan Fields   DOB: [birth year]   68 y.o. Male  MRN: [id number] Visit Date: 02/01/2024  Today's healthcare provider: Shirlee Latch, MD   No chief complaint on file.  Subjective    HPI   Discussed the use of AI scribe software for clinical note transcription with the patient, who gave verbal consent to proceed.  History of Present Illness           Pt is a 68 y.o. male who is here for preoperative clearance for L shoulder RTSA  1) High Risk Cardiac Conditions  1) Recent MI - No.  2) Decompensated Heart Failure - No.  3) Unstable angina - No.  4) Symptomatic arrythmia - No.  5) Sx Valvular Disease - No.  2) Intermediate Risk Factors - DM, CKD, CVA, CHF, CAD - Yes.    2) Functional Status - > 4 mets (Walk, run, climb stairs) Yes.  Kateri Mc Activity Status Index: 58.2 (9.89 METS)  3) Surgery Specific Risk - Intermediate   4) Further Noninvasive evaluation -   1) EKG - Yes.     1) Hx of CVA, CAD, DM, CKD  2) Echo - No.   1) Worsening dyspnea   3) Stress Testing - Active Cardiac Disease - No.  5) Need for medical therapy - Beta Blocker, Statins indicated ? No.         Medications: Outpatient Medications Prior to Visit  Medication Sig  . alfuzosin (UROXATRAL) 10 MG 24 hr tablet Take 1 tablet (10 mg total) by mouth daily with breakfast.  . atorvastatin (LIPITOR) 40 MG tablet Take 1 tablet (40 mg total) by mouth daily.  . celecoxib (CELEBREX) 200 MG capsule Take 200 mg by mouth 2 (two) times daily.  . Cholecalciferol (VITAMIN D3) 50 MCG (2000 UT) capsule Take 2,000 Units by mouth daily.  . dapagliflozin propanediol (FARXIGA) 10 MG TABS tablet Take 1 tablet (10 mg total) by mouth daily.  . fluticasone (FLONASE) 50 MCG/ACT nasal spray SHAKE LIQUID AND USE 2 SPRAYS IN EACH NOSTRIL DAILY  . gabapentin (NEURONTIN) 300 MG capsule Take 1 capsule (300 mg total) by mouth at bedtime.  Marland Kitchen lisinopril (ZESTRIL) 40 MG tablet Take  1 tablet (40 mg total) by mouth daily.  . meloxicam (MOBIC) 15 MG tablet Take 15 mg by mouth daily.  . metFORMIN (GLUCOPHAGE-XR) 500 MG 24 hr tablet TAKE 2 TABLETS(1000 MG) BY MOUTH TWICE DAILY WITH A MEAL  . NEEDLE, DISP, 18 G (BD DISP NEEDLES) 18G X 1-1/2" MISC 1 mg by Does not apply route every 14 (fourteen) days.  Marland Kitchen NEEDLE, DISP, 21 G (BD SAFETYGLIDE SHIELDED NEEDLE) 21G X 1-1/2" MISC Use 21guage needle to inject Testosterone Cypionate 1ml.  Marland Kitchen omeprazole (PRILOSEC) 20 MG capsule Take 20 mg by mouth daily.  . Protein POWD Take 1 Scoop by mouth daily.  . silver sulfADIAZINE (SILVADENE) 1 % cream Apply 1 Application topically daily.  . sitaGLIPtin (JANUVIA) 50 MG tablet Take 1 tablet (50 mg total) by mouth daily.  . Syringe, Disposable, (2-3CC SYRINGE) 3 ML MISC 1 mg by Does not apply route every 14 (fourteen) days.  Marland Kitchen testosterone cypionate (DEPOTESTOSTERONE CYPIONATE) 200 MG/ML injection INJECT 0.5 MLS IN THE MUSCLE EVERY WEEK. DISCARD REMAINDER OF VIAL   No facility-administered medications prior to visit.    Review of Systems {Insert previous labs (optional):23779} {See past labs  Heme  Chem  Endocrine  Serology  Results Review (  optional):1}   Objective    There were no vitals taken for this visit. {Insert last BP/Wt (optional):23777}{See vitals history (optional):1}  Physical Exam   No results found for any visits on 02/01/24.  Assessment & Plan     Problem List Items Addressed This Visit   None               I have independently evaluated patient.  Jonathan Fields is a 68 y.o. male who is moderate (need to lower A1c to get to low risk - we will recheck before surgery) risk for a intermediate risk surgery.  There are modifiable risk factors (A1c). Peyton Najjar D Mclarty's RCRI/NSQIP calculation for MACE is: 2.2%.    - EKG stable - UA per surgeon request -Review meds: NO ACE-I/ARB day of surgery.      No follow-ups on file.       Shirlee Latch, MD  [facility] [phone] (phone) [phone] (fax)  [facility]

## 2024-02-02 ENCOUNTER — Encounter: Payer: Self-pay | Admitting: Family Medicine

## 2024-02-02 LAB — SPECIMEN STATUS REPORT

## 2024-02-02 LAB — URINALYSIS, ROUTINE W REFLEX MICROSCOPIC
Bilirubin, UA: NEGATIVE
Leukocytes,UA: NEGATIVE
Nitrite, UA: NEGATIVE
Protein,UA: NEGATIVE
RBC, UA: NEGATIVE
Specific Gravity, UA: >=1.03 — AB (ref 1.005–1.030)
Urobilinogen, Ur: 0.2 mg/dL (ref 0.2–1.0)
pH, UA: 5 (ref 5.0–7.5)

## 2024-02-02 NOTE — Patient Instructions (Signed)
  Diet Recommendations for Diabetes   Starchy (carb) foods include: Bread, rice, pasta, potatoes, corn, crackers, bagels, muffins, all baked goods.  (Fruits, milk, and yogurt also have carbohydrate, but most of these foods will not spike your blood sugar as the starchy foods will.)  A few fruits do cause high blood sugars; use small portions of bananas (limit to 1/2 at a time), grapes, watermelon, and most tropical fruits.    Protein foods include: Meat, fish, poultry, eggs, dairy foods, and beans such as pinto and kidney beans (beans also provide carbohydrate).   1. Limit starchy foods to TWO per meal and ONE per snack. ONE portion of a starchy  food is equal to the following:   - ONE slice of bread (or its equivalent, such as half of a hamburger bun).   - 1/2 cup of a "scoopable" starchy food such as potatoes or rice.   - 15 grams of carbohydrate as shown on food label.  2. Include at every meal: a protein food, a carb food, and vegetables and/or fruit.   - Obtain twice as many veg's as protein or carbohydrate foods for both lunch and dinner.   - Fresh or frozen veg's are best.   - Try to keep frozen veg's on hand for a quick vegetable serving.        

## 2024-02-03 ENCOUNTER — Encounter: Payer: Self-pay | Admitting: Oncology

## 2024-02-04 ENCOUNTER — Telehealth: Payer: Self-pay | Admitting: Pharmacist

## 2024-02-04 ENCOUNTER — Other Ambulatory Visit: Payer: Self-pay | Admitting: Pharmacist

## 2024-02-04 NOTE — Progress Notes (Signed)
   02/04/2024  Patient ID: Jonathan Fields, male   DOB: 04-02-1956, 68 y.o.   MRN: [id number]  Attempted to call patient regarding scheduled call at [facility] today. Unable to reach. Left voicemail explaining the following information and to call back with any further concerns. Will send a MyChart message explaining the following as well.  Referral for cost assistance for [place] for first fill of the year.  Insurance has $255 drug deductible. Therefore, expecting first 1 month supply fill to be around $302 due to deductible + copay. The medication cost will then be $47 monthly for both the [place]. If this is too expensive, can consider patient assistance.  See patient is preparing for L shoulder surgery on 04/04/24 and will need to get BG readings down quickly over 2 months. Patient assistance may take awhile for shipments to arrive if approved, so paying at the pharmacy would be preferred starting off if possible.   Appears he is trying to avoid insulin as well. Rybelsus discontinued previously due to stomach issues.  Could technically try glipizide or pioglitazone as well. Can also ask about trialing Ozempic.    Ricka Burdock, PharmD [facility] Phone Number: [phone]

## 2024-02-05 ENCOUNTER — Ambulatory Visit
Admission: RE | Admit: 2024-02-05 | Discharge: 2024-02-05 | Disposition: A | Discharge status: Home / Self Care | Payer: Medicare Other | Source: Ambulatory Visit | Attending: Orthopaedic Surgery | Admitting: Orthopaedic Surgery

## 2024-02-05 ENCOUNTER — Telehealth: Payer: Self-pay

## 2024-02-05 ENCOUNTER — Encounter: Payer: Self-pay | Admitting: Oncology

## 2024-02-05 DIAGNOSIS — M19012 Primary osteoarthritis, left shoulder: Secondary | ICD-10-CM | POA: Diagnosis not present

## 2024-02-05 DIAGNOSIS — M25512 Pain in left shoulder: Secondary | ICD-10-CM | POA: Insufficient documentation

## 2024-02-05 DIAGNOSIS — Z01818 Encounter for other preprocedural examination: Secondary | ICD-10-CM | POA: Diagnosis not present

## 2024-02-05 NOTE — Telephone Encounter (Signed)
 Copied from CRM [id number]. Topic: Clinical - Medication Question >> Feb 05, 2024  8:20 AM Carlatta H wrote: Reason for CRM: Please have Ricka Burdock call the patient regarding medication

## 2024-02-08 ENCOUNTER — Ambulatory Visit: Payer: BC Managed Care – PPO | Admitting: Urology

## 2024-02-08 ENCOUNTER — Telehealth: Payer: Self-pay | Admitting: Pharmacist

## 2024-02-08 DIAGNOSIS — E1165 Type 2 diabetes mellitus with hyperglycemia: Secondary | ICD-10-CM

## 2024-02-08 NOTE — Progress Notes (Signed)
   02/08/2024  Patient ID: Jonathan Fields, male   DOB: [birth year], 68 y.o.   MRN: [id number]  Called and spoke with the patient on the phone to verify medication costs listed in prior telephone encounter.  Confirmed understanding of $302 medication cost with Januvia. Will then be $47 monthly for [place] and Januvia afterwards. Reports this is affordable.  Plan to check-in on BG readings in 2 weeks to see how they are doing. Needs to get A1c to <7.5% by first week of May for surgery. Patient requested to get A1c re-checked 1 week sooner, as he wants it checked for surgery approval prior to requesting days off from work. Must be at least 1 week in advance for request. Therefore, requesting week of 4/14-4/18 to have it checked instead. Will send message to Dr. B to discuss.    Ricka Burdock, PharmD [facility] Phone Number: [phone]

## 2024-02-08 NOTE — Telephone Encounter (Signed)
 Ok to order A1c and let him know to have it checked the week before 4/24.  I know this is earlier than 3 m from last one, but need for pre-op. Thanks!

## 2024-02-08 NOTE — Telephone Encounter (Signed)
 Pt advised. Verbalized understanding. Order placed

## 2024-02-09 ENCOUNTER — Ambulatory Visit: Payer: BC Managed Care – PPO | Admitting: Urology

## 2024-02-10 ENCOUNTER — Ambulatory Visit: Payer: BC Managed Care – PPO | Admitting: Urology

## 2024-02-10 ENCOUNTER — Encounter: Payer: Self-pay | Admitting: Urology

## 2024-02-10 ENCOUNTER — Other Ambulatory Visit: Payer: Self-pay | Admitting: Urology

## 2024-02-10 VITALS — BP 131/78 | HR 108 | Ht 74.0 in | Wt 225.0 lb

## 2024-02-10 DIAGNOSIS — N401 Enlarged prostate with lower urinary tract symptoms: Secondary | ICD-10-CM | POA: Diagnosis not present

## 2024-02-10 DIAGNOSIS — Z125 Encounter for screening for malignant neoplasm of prostate: Secondary | ICD-10-CM

## 2024-02-10 DIAGNOSIS — E291 Testicular hypofunction: Secondary | ICD-10-CM

## 2024-02-10 MED ORDER — SYRINGE 23G X 1" 3 ML MISC: 0 refills | Status: DC

## 2024-02-10 MED ORDER — ALFUZOSIN HCL ER 10 MG PO TB24
10.0000 mg | ORAL_TABLET | Freq: Two times a day (BID) | ORAL | 0 refills | Status: DC
Start: 2024-02-10 — End: 2024-03-09

## 2024-02-10 MED ORDER — TESTOSTERONE CYPIONATE 200 MG/ML IM SOLN: INTRAMUSCULAR | 0 refills | Status: DC

## 2024-02-10 NOTE — Progress Notes (Signed)
 I, Jonathan Fields, acting as a scribe for Jonathan Altes, MD., have documented all relevant documentation on the behalf of Jonathan Altes, MD, as directed by Jonathan Altes, MD while in the presence of Jonathan Altes, MD.  02/10/2024 3:50 PM   Jonathan Fields 1956-11-26 [id number]  Referring provider: Erasmo Downer, MD [address] [place],  [place] [address]  Chief Complaint  Patient presents with   Hypogonadism   Urologic history: 1.  BPH with lower urinary tract symptoms Prior medical management UDS consistent with obstruction. TURP by Dr. Sherryl Barters 01/2018; 7 g, benign    2.  Hypogonadism  On TRT Followed in hematology for erythrocytosis  HPI: Jonathan Fields is a 68 y.o. male presents for a follow-up visit.  He did see Michiel Cowboy 09/29/23 and had been on Oxybutynin XL as Myrbetriq became cost-prohibited. He subsequently has discontinued his Oxybutynin and remains on alfuzosin with no worsening of his avoiding symptoms. He does have urinary frequency, but continues to drink approximately 3 liters of water per day. Nocturia is stable x2. He has a history of sleep apnea and unable to tolerate CPAP. Last labs August 24 with testosterone level of 7.75 Followed by hematology for erythrocytosis and had phlebotomy December 2024.   PMH: Past Medical History:  Diagnosis Date   Allergy    Anxiety    Arthritis    BPH (benign prostatic hyperplasia)    Bronchitis    Complication of anesthesia    hard to wake up   DM (diabetes mellitus), type 2 (HCC)    DVT (deep venous thrombosis) (HCC)    post op   ED (erectile dysfunction)    Erythrocytosis 07/04/2020   GERD (gastroesophageal reflux disease)    Hyperlipemia    Hypertension    Hypogonadism in male    Nocturia    Right ankle instability    Sleep apnea    TB lung, latent    Urinary frequency     Surgical History: Past Surgical History:  Procedure Laterality Date   ANKLE ARTHROSCOPY  Right 11/13/2021   Procedure: ANKLE ARTHROSCOPY Arthroscopic Irrigation;  Surgeon: Edwin Cap, DPM;  Location: [facility] ORS;  Service: Podiatry;  Laterality: Right;   ANKLE SURGERY Left    torn ligament   CARPAL TUNNEL RELEASE Right 08/2019   COLONOSCOPY  2009   COLONOSCOPY WITH PROPOFOL N/A 01/23/2022   Procedure: COLONOSCOPY WITH PROPOFOL;  Surgeon: Midge Minium, MD;  Location: [facility] ENDOSCOPY;  Service: Endoscopy;  Laterality: N/A;   HERNIA REPAIR     IRRIGATION AND DEBRIDEMENT ABSCESS Right 11/13/2021   Procedure: IRRIGATION AND DEBRIDEMENT ABSCESS;  Surgeon: Edwin Cap, DPM;  Location: [facility] ORS;  Service: Podiatry;  Laterality: Right;   IRRIGATION AND DEBRIDEMENT FOOT Right 11/16/2021   Procedure: IRRIGATION AND DEBRIDEMENT ANKLE;  Surgeon: Felecia Shelling, DPM;  Location: [facility] ORS;  Service: Podiatry;  Laterality: Right;   KNEE ARTHROSCOPY Left    right ankle ruptured tendon surgery  2008   TRANSURETHRAL RESECTION OF PROSTATE N/A 02/19/2018   Procedure: TRANSURETHRAL RESECTION OF THE PROSTATE (TURP);  Surgeon: Hildred Laser, MD;  Location: [facility] ORS;  Service: Urology;  Laterality: N/A;    Home Medications:  Allergies as of 02/10/2024       Reactions   Penicillins Hives   Has patient had a PCN reaction causing immediate rash, facial/tongue/throat swelling, SOB or lightheadedness with hypotension: yes Has patient had a PCN reaction causing severe  rash involving mucus membranes or skin necrosis: no Has patient had a PCN reaction that required hospitalization: no Has patient had a PCN reaction occurring within the last 10 years: no If all of the above answers are "NO", then may proceed with Cephalosporin use.        Medication List        Accurate as of February 10, 2024  3:50 PM. If you have any questions, ask your nurse or doctor.          2-3CC SYRINGE 3 ML Misc 1 mg by Does not apply route every 14 (fourteen) days.   alfuzosin 10 MG 24 hr tablet Commonly  known as: UROXATRAL Take 1 tablet (10 mg total) by mouth in the morning and at bedtime. What changed: when to take this Changed by: Jonathan Fields   atorvastatin 40 MG tablet Commonly known as: LIPITOR Take 1 tablet (40 mg total) by mouth daily.   BD Disp Needles 18G X 1-1/2" Misc Generic drug: NEEDLE (DISP) 18 G 1 mg by Does not apply route every 14 (fourteen) days.   BD SafetyGlide Shielded Needle 21G X 1-1/2" Misc Generic drug: NEEDLE (DISP) 21 G Use 21guage needle to inject Testosterone Cypionate 1ml.   celecoxib 200 MG capsule Commonly known as: CELEBREX Take 200 mg by mouth 2 (two) times daily.   dapagliflozin propanediol 10 MG Tabs tablet Commonly known as: FARXIGA Take 1 tablet (10 mg total) by mouth daily.   fluticasone 50 MCG/ACT nasal spray Commonly known as: FLONASE SHAKE LIQUID AND USE 2 SPRAYS IN EACH NOSTRIL DAILY   gabapentin 300 MG capsule Commonly known as: NEURONTIN Take 1 capsule (300 mg total) by mouth at bedtime.   lisinopril 40 MG tablet Commonly known as: ZESTRIL Take 1 tablet (40 mg total) by mouth daily.   meloxicam 15 MG tablet Commonly known as: MOBIC Take 15 mg by mouth daily.   metFORMIN 500 MG 24 hr tablet Commonly known as: GLUCOPHAGE-XR TAKE 2 TABLETS(1000 MG) BY MOUTH TWICE DAILY WITH A MEAL   omeprazole 20 MG capsule Commonly known as: PRILOSEC Take 20 mg by mouth daily.   Protein Powd Take 1 Scoop by mouth daily.   silver sulfADIAZINE 1 % cream Commonly known as: Silvadene Apply 1 Application topically daily.   sitaGLIPtin 50 MG tablet Commonly known as: Januvia Take 1 tablet (50 mg total) by mouth daily.   SYRINGE 3CC/23GX1" 23G X 1" 3 ML Misc Use as directed Started by: Jonathan Fields   testosterone cypionate 200 MG/ML injection Commonly known as: DEPOTESTOSTERONE CYPIONATE INJECT 0.5 MLS IN THE MUSCLE EVERY WEEK. DISCARD REMAINDER OF VIAL   Vitamin D3 50 MCG (2000 UT) capsule Take 2,000 Units by mouth  daily.        Allergies:  Allergies  Allergen Reactions   Penicillins Hives    Has patient had a PCN reaction causing immediate rash, facial/tongue/throat swelling, SOB or lightheadedness with hypotension: yes Has patient had a PCN reaction causing severe rash involving mucus membranes or skin necrosis: no Has patient had a PCN reaction that required hospitalization: no Has patient had a PCN reaction occurring within the last 10 years: no If all of the above answers are "NO", then may proceed with Cephalosporin use.     Family History: Family History  Problem Relation Age of Onset   Heart disease Father    Hypertension Father    Prostate cancer Father    Heart disease Mother    Breast  cancer Mother    Colon cancer Neg Hx     Social History:  reports that he has never smoked. His smokeless tobacco use includes snuff. He reports that he does not currently use alcohol. He reports that he does not use drugs.   Physical Exam: BP 131/78   Pulse (!) 108   Ht 6\' 2"  (1.88 m)   Wt 225 lb (102.1 kg)   BMI 28.89 kg/m   Constitutional:  Alert and oriented, No acute distress. HEENT: [place] AT, moist mucus membranes.  Trachea midline, no masses. Cardiovascular: No clubbing, cyanosis, or edema. Respiratory: Normal respiratory effort, no increased work of breathing. GI: Abdomen is soft, nontender, nondistended, no abdominal masses Skin: No rashes, bruises or suspicious lesions. Neurologic: Grossly intact, no focal deficits, moving all 4 extremities. Psychiatric: Normal mood and affect.  Assessment & Plan:    1. BPH with LUTS Stable on alfuzosin. He inquired if he could take alfuzosin twice daily, and was instructed this would be okay to try. He was given Rx for a 9month trial of 10 milligrams twice daily, and will call back regarding efficacy.   2. Hypogonadism Testosterone, PSA drawn today. His H/H is checked by erythrocytosis.  Follow-up 6 months testosterone level and office  visit 1 year for DRE, testosterone and PSA He was scheduled for a PA follow-up next month with labs and these appointments were cancelled.  [facility] [address], [address] [place], [place] [address] [phone]

## 2024-02-11 LAB — PSA: Prostate Specific Ag, Serum: 1.3 ng/mL (ref 0.0–4.0)

## 2024-02-11 LAB — TESTOSTERONE: Testosterone: 380 ng/dL (ref 264–916)

## 2024-02-18 ENCOUNTER — Telehealth: Payer: Self-pay | Admitting: Pharmacist

## 2024-02-18 DIAGNOSIS — E1165 Type 2 diabetes mellitus with hyperglycemia: Secondary | ICD-10-CM

## 2024-02-18 MED ORDER — ONETOUCH DELICA PLUS LANCET33G MISC: 3 refills | Status: AC

## 2024-02-18 MED ORDER — ONETOUCH ULTRA TEST VI STRP: ORAL_STRIP | 3 refills | Status: DC

## 2024-02-18 MED ORDER — ONETOUCH ULTRA 2 W/DEVICE KIT
PACK | 0 refills | Status: DC
Start: 2024-02-18 — End: 2024-06-23

## 2024-02-18 NOTE — Progress Notes (Signed)
   02/18/2024  Patient ID: Jonathan Fields, male   DOB: 1956-05-23, 68 y.o.   MRN: [id number]  Called and spoke with the patient on the phone today for diabetes follow-up. Patient reports getting Januvia form the pharmacy and starting it.  Has not been checking blood sugars at home as his supplies are expired. Advised I would send scripts to the pharmacy for testing supplies and would recommend he starting checking his fasting blood sugar and writing it down.  This way we can estimate his upcoming A1c and change medications prior to it being re-checked. If it is above 7.5%, his surgery will likely be delayed another 3 months.   Patient confirmed understanding. Will check-in again in 1.5 weeks to see about blood sugar readings.    Ricka Burdock, PharmD [facility]  Phone Number: [phone]

## 2024-02-22 ENCOUNTER — Telehealth: Payer: Self-pay

## 2024-02-22 NOTE — Telephone Encounter (Signed)
 Patient was identified as falling into the [place] Measure - Diabetes.   Patient was: Appointment scheduled for lab or office visit for A1c.  A1C on 01/07/24 trending down. Follow up appointment is scheduled

## 2024-02-23 ENCOUNTER — Ambulatory Visit: Payer: Medicare Other | Admitting: Podiatry

## 2024-02-24 ENCOUNTER — Telehealth: Payer: Self-pay | Admitting: Pharmacist

## 2024-02-24 ENCOUNTER — Telehealth: Payer: Self-pay

## 2024-02-24 NOTE — Progress Notes (Signed)
   02/24/2024  Patient ID: Jonathan Fields, male   DOB: 12-26-1955, 68 y.o.   MRN: [id number]  Called and met with the patient on the phone for 40 minutes trying to get lancet and meter to work. Pricked finger at least 3 times and system would not give a result. Unable to get it to work even after getting an ample amount of blood to test.  Due to this, plan to meet the patient at [facility] office at 3:30PM on Monday to review testing supplies.    Ricka Burdock, PharmD [facility]  Phone Number: [phone]

## 2024-02-24 NOTE — Telephone Encounter (Signed)
 Copied from CRM [id number]. Topic: Clinical - Medication Question >> Feb 24, 2024 10:15 AM Elle L wrote: Reason for CRM: The patient is requesting to speak to Raven regarding his blood testing equipment. The patient's call back number is [phone].

## 2024-02-26 ENCOUNTER — Ambulatory Visit: Admitting: Podiatry

## 2024-02-29 ENCOUNTER — Other Ambulatory Visit: Payer: Self-pay | Admitting: Pharmacist

## 2024-02-29 DIAGNOSIS — E1165 Type 2 diabetes mellitus with hyperglycemia: Secondary | ICD-10-CM

## 2024-02-29 MED ORDER — LANCETS 28G THIN MISC: 1.0000 | Freq: Every day | 2 refills | Status: AC

## 2024-02-29 NOTE — Addendum Note (Signed)
 Addended by: Linna Darner on: 02/29/2024 04:18 PM   Modules accepted: Orders

## 2024-02-29 NOTE — Progress Notes (Signed)
   02/29/2024  Patient ID: Jonathan Fields, male   DOB: [birth year], 68 y.o.   MRN: [id number]  Met with the patient in-office today for glucometer and lancet device review. Patient's lancing device is not working. Nurse Janey Greaser confirmed this as well.   Due to this, patient provided a few lancets to hold him over for the next few weeks, which we need to get blood sugar readings. Will send new script to the pharmacy for individual lancets- aware insurance will likely not cover these but knows it is necessary.  BG at 4PM was 116 today. Discussed goals of FBG <130 and daytime BG after meals is 180. Last ate about 4 hours ago.   Patient plans to check FBG every other day and write them down. Will report at follow-up call.  Follow-up call at 3:30PM on 03/07/24.   **Need to have BG of <130 on average prior to lab testing for surgery.   Ricka Burdock, PharmD [facility]  Phone Number: [phone]

## 2024-03-01 ENCOUNTER — Ambulatory Visit: Admitting: Podiatry

## 2024-03-01 ENCOUNTER — Encounter: Payer: Self-pay | Admitting: Podiatry

## 2024-03-01 DIAGNOSIS — M216X9 Other acquired deformities of unspecified foot: Secondary | ICD-10-CM | POA: Diagnosis not present

## 2024-03-01 DIAGNOSIS — L97512 Non-pressure chronic ulcer of other part of right foot with fat layer exposed: Secondary | ICD-10-CM

## 2024-03-01 MED ORDER — SILVER SULFADIAZINE 1 % EX CREA: 1.0000 | TOPICAL_CREAM | Freq: Every day | CUTANEOUS | 1 refills | Status: AC

## 2024-03-01 NOTE — Progress Notes (Signed)
 Chief Complaint  Patient presents with   Diabetic Ulcer    "It has improved a lot but I have another area that I want him to look at." N - another place L - plantar lateral right D - 2 weeks O - suddenly C - very sore A - walking T - put that cream that he gave me on it, stayed off of it last weekend    Subjective:  68 y.o. male with PMHx of diabetes mellitus presenting today for follow-up evaluation of a fissure ulcer to the lateral aspect of the right foot.  He has noticed some improvement.  No new complaints   Past Medical History:  Diagnosis Date   Allergy    Anxiety    Arthritis    BPH (benign prostatic hyperplasia)    Bronchitis    Complication of anesthesia    hard to wake up   DM (diabetes mellitus), type 2 (HCC)    DVT (deep venous thrombosis) (HCC)    post op   ED (erectile dysfunction)    Erythrocytosis 07/04/2020   GERD (gastroesophageal reflux disease)    Hyperlipemia    Hypertension    Hypogonadism in male    Nocturia    Right ankle instability    Sleep apnea    TB lung, latent    Urinary frequency     Past Surgical History:  Procedure Laterality Date   ANKLE ARTHROSCOPY Right 11/13/2021   Procedure: ANKLE ARTHROSCOPY Arthroscopic Irrigation;  Surgeon: Edwin Cap, DPM;  Location: [facility];  Service: Podiatry;  Laterality: Right;   ANKLE SURGERY Left    torn ligament   CARPAL TUNNEL RELEASE Right 08/2019   COLONOSCOPY  2009   COLONOSCOPY WITH PROPOFOL N/A 01/23/2022   Procedure: COLONOSCOPY WITH PROPOFOL;  Surgeon: Midge Minium, MD;  Location: [facility];  Service: Endoscopy;  Laterality: N/A;   HERNIA REPAIR     IRRIGATION AND DEBRIDEMENT ABSCESS Right 11/13/2021   Procedure: IRRIGATION AND DEBRIDEMENT ABSCESS;  Surgeon: Edwin Cap, DPM;  Location: [facility];  Service: Podiatry;  Laterality: Right;   IRRIGATION AND DEBRIDEMENT FOOT Right 11/16/2021   Procedure: IRRIGATION AND DEBRIDEMENT ANKLE;  Surgeon: Felecia Shelling, DPM;   Location: [facility];  Service: Podiatry;  Laterality: Right;   KNEE ARTHROSCOPY Left    right ankle ruptured tendon surgery  2008   TRANSURETHRAL RESECTION OF PROSTATE N/A 02/19/2018   Procedure: TRANSURETHRAL RESECTION OF THE PROSTATE (TURP);  Surgeon: Hildred Laser, MD;  Location: [facility];  Service: Urology;  Laterality: N/A;    Allergies  Allergen Reactions   Penicillins Hives    Has patient had a PCN reaction causing immediate rash, facial/tongue/throat swelling, SOB or lightheadedness with hypotension: yes Has patient had a PCN reaction causing severe rash involving mucus membranes or skin necrosis: no Has patient had a PCN reaction that required hospitalization: no Has patient had a PCN reaction occurring within the last 10 years: no If all of the above answers are "NO", then may proceed with Cephalosporin use.      Objective/Physical Exam General: The patient is alert and oriented x3 in no acute distress.  Dermatology:  The deep fissure along the lateral aspect of the fifth metatarsal base is significantly improved.  It only measures approximately 1.0 x 0.5 x 0.1 cm.  Granular wound base.  No exposed bone.  No clinical indication of infection.  Vascular: Palpable pedal pulses bilaterally. No edema or erythema noted. Capillary refill within normal limits.  Neurological: Grossly intact via light touch  Musculoskeletal Exam: Rigid cavovarus deformity bilateral feet with lateral column loading with weightbearing.  This deformity is what is contributing to the pinching of the skin creating the callus and fissure  Assessment: 1.  Ulcer/left skin lateral aspect of the right foot just cephalad to the fifth metatarsal tubercle 2. diabetes mellitus w/ peripheral neuropathy 3. Cavovarus deformity bilateral feet  4.  PSxHx lateral ankle stabilization procedure 09/05/2021 with secondary infection  Plan of Care:  -Patient was evaluated. - Medically necessary excisional debridement  including subcutaneous tissue was performed today using a tissue nipper.  Excisional debridement of the necrotic nonviable tissue down to healthier bleeding viable tissue was performed with postdebridement measurement same as pre- -continue Silvadene cream and a light dressing applied. -Prescription for Silvadene cream.  Apply twice daily -In regards to the cavovarus deformity of the foot, he continues to wear hightop sneakers which seems to be the best to alleviate his foot pain and create stability -Return to clinic 3 weeks    Felecia Shelling, DPM [facility]  Dr. Felecia Shelling, DPM    [address] [place], [place] [address]                Office [phone]  Fax [phone]

## 2024-03-07 ENCOUNTER — Other Ambulatory Visit: Payer: Self-pay | Admitting: Pharmacist

## 2024-03-07 DIAGNOSIS — E1165 Type 2 diabetes mellitus with hyperglycemia: Secondary | ICD-10-CM

## 2024-03-07 NOTE — Progress Notes (Unsigned)
   03/07/2024  Patient ID: Jonathan Fields, male   DOB: 27-Jul-1956, 68 y.o.   MRN: [id number]  Called and spoke with the patient on the phone today. York Spaniel he has been trying daily since our office visit to get BG readings with meter and followed steps shown. However, no results from the meter, even though it worked in-office.  DM medications: Metformin ER 2000mg  max dose, Farxiga 10mg  (max dose), Januvia 50mg  daily  Shoulder surgery on 04/04/24. Advised if able to getting FBG readings and see they have been controlled and A1c comes back higher, could get fructosamine checked. However due to high risk procedure, patient would prefer to wait until next A1c 3 months later to re-schedule surgery if above goal on 03/22/24 check.   Plan:  - Follow-up on 03/23/24 with A1c results  - Contact [facility] with information about A1c check and will decide about needing to re-schedule or not  - [facility]- [phone] with Dr. Franco Collet - Get more fasting BG readings until then - Called pharmacy about lancets since patient did not receive- said they need to process script since system is reading it wrong and to call back in 1.5 hours - Advised to bring meter and strips to pharmacy to get pharmacist watch him do it to see what is wrong at lancet pick-up  Update from 03/08/24: - Lancets will be $7.53 for a 100 count at [facility] location tomorrow - [facility] said they can't order individual lancets  - Message sent for patient understanding    Ricka Burdock, PharmD [facility]  Phone Number: [phone]

## 2024-03-08 ENCOUNTER — Encounter: Payer: Self-pay | Admitting: Oncology

## 2024-03-08 ENCOUNTER — Other Ambulatory Visit: Payer: Self-pay | Admitting: Urology

## 2024-03-08 ENCOUNTER — Other Ambulatory Visit: Payer: Self-pay

## 2024-03-08 DIAGNOSIS — N401 Enlarged prostate with lower urinary tract symptoms: Secondary | ICD-10-CM

## 2024-03-08 MED ORDER — EMBRACE PRESSURE ACTIVATED 28G MISC
0 refills | Status: AC
  Filled 2024-03-08: qty 100, 100d supply, fill #0

## 2024-03-09 ENCOUNTER — Other Ambulatory Visit: Payer: Self-pay

## 2024-03-09 ENCOUNTER — Encounter: Payer: Self-pay | Admitting: *Deleted

## 2024-03-13 ENCOUNTER — Other Ambulatory Visit: Payer: Self-pay | Admitting: Urology

## 2024-03-13 DIAGNOSIS — N401 Enlarged prostate with lower urinary tract symptoms: Secondary | ICD-10-CM

## 2024-03-15 ENCOUNTER — Telehealth: Payer: Self-pay | Admitting: Family Medicine

## 2024-03-15 MED ORDER — METFORMIN HCL ER 500 MG PO TB24: ORAL_TABLET | ORAL | 0 refills | Status: DC

## 2024-03-15 NOTE — Telephone Encounter (Signed)
 [facility] fax[facility] refill request for the following medications:  metFORMIN (GLUCOPHAGE-XR) 500 MG 24 hr tablet   Please advise.

## 2024-03-22 ENCOUNTER — Ambulatory Visit: Admitting: Family Medicine

## 2024-03-22 ENCOUNTER — Ambulatory Visit: Admitting: Podiatry

## 2024-03-22 ENCOUNTER — Other Ambulatory Visit: Payer: Self-pay

## 2024-03-22 ENCOUNTER — Encounter: Payer: Self-pay | Admitting: Family Medicine

## 2024-03-22 VITALS — BP 146/90 | HR 89 | Ht 74.0 in | Wt 230.4 lb

## 2024-03-22 DIAGNOSIS — E1159 Type 2 diabetes mellitus with other circulatory complications: Secondary | ICD-10-CM

## 2024-03-22 DIAGNOSIS — I152 Hypertension secondary to endocrine disorders: Secondary | ICD-10-CM

## 2024-03-22 DIAGNOSIS — E1165 Type 2 diabetes mellitus with hyperglycemia: Secondary | ICD-10-CM | POA: Diagnosis not present

## 2024-03-22 DIAGNOSIS — Z7984 Long term (current) use of oral hypoglycemic drugs: Secondary | ICD-10-CM | POA: Diagnosis not present

## 2024-03-22 LAB — POCT GLYCOSYLATED HEMOGLOBIN (HGB A1C): Hemoglobin A1C: 7.7 % — AB (ref 4.0–5.6)

## 2024-03-22 MED ORDER — SITAGLIPTIN PHOSPHATE 100 MG PO TABS
100.0000 mg | ORAL_TABLET | Freq: Every day | ORAL | 2 refills | Status: DC
Start: 2024-03-22 — End: 2024-05-25

## 2024-03-22 MED ORDER — AMLODIPINE BESYLATE 5 MG PO TABS: 5.0000 mg | ORAL_TABLET | Freq: Every day | ORAL | 1 refills | Status: DC

## 2024-03-22 NOTE — Assessment & Plan Note (Signed)
 Blood pressure is elevated at 146/90 mmHg. Previously on lisinopril  40 mg daily, which is the maximum dose. Self-discontinued hydrochlorothiazide  12.5 mg daily due to increased urination. Discussed need for better blood pressure control, especially preoperatively. Amlodipine  was chosen to avoid increased urination. - Prescribe amlodipine  5 mg daily in the morning. - Recheck blood pressure in one month.

## 2024-03-22 NOTE — Progress Notes (Signed)
 Established patient visit   Patient: Jonathan Fields   DOB: [birth year]   68 y.o. Male  MRN: [id number] Visit Date: 03/22/2024  Today's healthcare provider: Aden Agreste, MD   Chief Complaint  Patient presents with   Medical Management of Chronic Issues   Diabetes    No symptoms to report.  Unable to check at home as meter provided he is not sure how to use   Subjective    Diabetes   HPI     Diabetes    Additional comments: No symptoms to report.  Unable to check at home as meter provided he is not sure how to use      Last edited by Pasty Bongo, CMA on 03/22/2024  3:17 PM.       Discussed the use of AI scribe software for clinical note transcription with the patient, who gave verbal consent to proceed.  History of Present Illness   A 68 year old patient with a history of hypertension, type 2 diabetes, and hyperlipidemia presents for a diabetes follow-up. The patient has been struggling with hyperglycemia despite being on multiple medications including Lipitor 40mg  daily, Farxiga  10mg  daily, Lisinopril  40mg  daily, Metformin  XR 1000mg  twice daily, and Januvia  50mg  daily. The patient is scheduled for surgery next month and is working on lowering his A1c levels. The patient's A1c levels have shown a decreasing trend from 9.4 to 8.8 and currently at 7.7. The patient has been making dietary changes to manage his blood sugar levels, reducing sweet and starchy foods. The patient is also experiencing high blood pressure, which varies and is currently higher than usual. The patient has a history of self-discontinuing hydrochlorothiazide  due to increased urination, which was inconvenient due to his job.         Medications: Outpatient Medications Prior to Visit  Medication Sig   alfuzosin  (UROXATRAL ) 10 MG 24 hr tablet TAKE 1 TABLET(10 MG) BY MOUTH IN THE MORNING AND AT BEDTIME   atorvastatin  (LIPITOR) 40 MG tablet Take 1 tablet (40 mg total) by mouth daily.    Blood Glucose Monitoring Suppl (ONE TOUCH ULTRA 2) w/Device KIT Use as directed to check blood sugar daily. Dx E11.65   celecoxib (CELEBREX) 200 MG capsule Take 200 mg by mouth 2 (two) times daily.   Cholecalciferol (VITAMIN D3) 50 MCG (2000 UT) capsule Take 2,000 Units by mouth daily.   dapagliflozin  propanediol (FARXIGA ) 10 MG TABS tablet Take 1 tablet (10 mg total) by mouth daily.   fluticasone  (FLONASE ) 50 MCG/ACT nasal spray SHAKE LIQUID AND USE 2 SPRAYS IN EACH NOSTRIL DAILY   gabapentin  (NEURONTIN ) 300 MG capsule Take 1 capsule (300 mg total) by mouth at bedtime.   glucose blood (ONETOUCH ULTRA TEST) test strip Use as directed to check blood sugar daily. Dx E11.65   Lancets (EMBRACE PRESSURE ACTIVATED 28G) MISC Use to check blood sugar daily. Dx E11.65   Lancets (ONETOUCH DELICA PLUS LANCET33G) MISC Use as directed to check blood sugar daily. Dx E11.65   Lancets 28G Thin MISC 1 Lancet by Does not apply route daily. Dx E11.65   lisinopril  (ZESTRIL ) 40 MG tablet Take 1 tablet (40 mg total) by mouth daily.   meloxicam (MOBIC) 15 MG tablet Take 15 mg by mouth daily.   metFORMIN  (GLUCOPHAGE -XR) 500 MG 24 hr tablet TAKE 2 TABLETS(1000 MG) BY MOUTH TWICE DAILY WITH A MEAL   NEEDLE, DISP, 18 G (BD DISP NEEDLES) 18G X 1-1/2" MISC 1 mg by Does not apply route  every 14 (fourteen) days.   NEEDLE, DISP, 21 G (BD SAFETYGLIDE SHIELDED NEEDLE) 21G X 1-1/2" MISC Use 21guage needle to inject Testosterone  Cypionate 1ml.   omeprazole (PRILOSEC) 20 MG capsule Take 20 mg by mouth daily.   Protein POWD Take 1 Scoop by mouth daily.   silver  sulfADIAZINE  (SILVADENE ) 1 % cream Apply 1 Application topically daily.   Syringe, Disposable, (2-3CC SYRINGE) 3 ML MISC 1 mg by Does not apply route every 14 (fourteen) days.   Syringe/Needle, Disp, (SYRINGE 3CC/23GX1") 23G X 1" 3 ML MISC Use as directed   testosterone  cypionate (DEPOTESTOSTERONE CYPIONATE) 200 MG/ML injection INJECT 0.5 MLS IN THE MUSCLE EVERY WEEK. DISCARD  REMAINDER OF VIAL   [DISCONTINUED] sitaGLIPtin  (JANUVIA ) 50 MG tablet Take 1 tablet (50 mg total) by mouth daily.   No facility-administered medications prior to visit.    Review of Systems     Objective    BP (!) 146/90 (BP Location: Left Arm, Patient Position: Sitting, Cuff Size: Large)   Pulse 89   Ht 6\' 2"  (1.88 m)   Wt 230 lb 6.4 oz (104.5 kg)   SpO2 97%   BMI 29.58 kg/m    Physical Exam Vitals reviewed.  Constitutional:      General: He is not in acute distress.    Appearance: Normal appearance. He is not diaphoretic.  HENT:     Head: Normocephalic and atraumatic.  Eyes:     General: No scleral icterus.    Conjunctiva/sclera: Conjunctivae normal.  Cardiovascular:     Rate and Rhythm: Normal rate and regular rhythm.     Heart sounds: Normal heart sounds. No murmur heard. Pulmonary:     Effort: Pulmonary effort is normal. No respiratory distress.     Breath sounds: Normal breath sounds. No wheezing or rhonchi.  Musculoskeletal:     Cervical back: Neck supple.     Right lower leg: No edema.     Left lower leg: No edema.  Lymphadenopathy:     Cervical: No cervical adenopathy.  Skin:    General: Skin is warm and dry.     Findings: No rash.  Neurological:     Mental Status: He is alert and oriented to person, place, and time. Mental status is at baseline.  Psychiatric:        Mood and Affect: Mood normal.        Behavior: Behavior normal.      Results for orders placed or performed in visit on 03/22/24  POCT HgB A1C  Result Value Ref Range   Hemoglobin A1C 7.7 (A) 4.0 - 5.6 %   HbA1c POC (<> result, manual entry)     HbA1c, POC (prediabetic range)     HbA1c, POC (controlled diabetic range)      Assessment & Plan     Problem List Items Addressed This Visit       Cardiovascular and Mediastinum   Hypertension associated with diabetes (HCC)   Blood pressure is elevated at 146/90 mmHg. Previously on lisinopril  40 mg daily, which is the maximum dose.  Self-discontinued hydrochlorothiazide  12.5 mg daily due to increased urination. Discussed need for better blood pressure control, especially preoperatively. Amlodipine  was chosen to avoid increased urination. - Prescribe amlodipine  5 mg daily in the morning. - Recheck blood pressure in one month.      Relevant Medications   sitaGLIPtin  (JANUVIA ) 100 MG tablet   amLODipine  (NORVASC ) 5 MG tablet     Endocrine   T2DM (type 2 diabetes mellitus) (  HCC) - Primary   A1c has decreased from 9.4% to 8.8% to 7.7%, but remains above the surgical target of 7.5%. He is considering delaying surgery to further reduce A1c. Dietary modifications and the importance of maintaining blood glucose control were discussed. He prefers fingerstick glucose monitoring over continuous glucose monitoring. Plans to delay surgery for three months to achieve an A1c below 7% (at least <7.5). - Increase Januvia  to 100 mg daily. - Schedule a nurse visit for glucometer training this week. Offered CGM but patient declined - continue other DM meds without change - Schedule follow-up in one month to recheck A1c. - Send preoperative form to surgeon indicating plan to delay surgery for three months to improve A1c.      Relevant Medications   sitaGLIPtin  (JANUVIA ) 100 MG tablet   Other Relevant Orders   POCT HgB A1C (Completed)        Hyperlipidemia Managed with Lipitor 40 mg daily.       Return in about 4 weeks (around 04/19/2024) for chronic disease f/u and Nurse visit for glucometer training later this week.      Total time spent on today's visit was greater than 40 minutes, including both face-to-face time and nonface-to-face time personally spent on review of chart (labs and imaging), discussing labs and goals, treatment options, answering patient's questions, and coordinating care.    Aden Agreste, MD  [facility] [phone] (phone) [phone] (fax)  [facility]

## 2024-03-22 NOTE — Assessment & Plan Note (Signed)
 A1c has decreased from 9.4% to 8.8% to 7.7%, but remains above the surgical target of 7.5%. He is considering delaying surgery to further reduce A1c. Dietary modifications and the importance of maintaining blood glucose control were discussed. He prefers fingerstick glucose monitoring over continuous glucose monitoring. Plans to delay surgery for three months to achieve an A1c below 7% (at least <7.5). - Increase Januvia  to 100 mg daily. - Schedule a nurse visit for glucometer training this week. Offered CGM but patient declined - continue other DM meds without change - Schedule follow-up in one month to recheck A1c. - Send preoperative form to surgeon indicating plan to delay surgery for three months to improve A1c.

## 2024-03-23 ENCOUNTER — Telehealth: Payer: Self-pay | Admitting: Family Medicine

## 2024-03-23 ENCOUNTER — Other Ambulatory Visit: Payer: Self-pay | Admitting: Pharmacist

## 2024-03-23 NOTE — Progress Notes (Signed)
   03/23/2024  Patient ID: Jonathan Fields, male   DOB: Feb 21, 1956, 68 y.o.   MRN: [id number]  Spoke with the patient on the phone today. Reports based on last visit his A1c was 7.7% which was above the 7.5% cutoff for surgery. Will push back surgery another 3 months. Focusing on BP and A1c control.   Plans to get new Januvia  100mg  and Amlodipine  5mg  from the pharmacy today and start. Will also come in for a nurse visit when able to see if the meter can be fixed.   Agreed to call in 4 weeks to see how things are going with BG and BP.   Delvin File, PharmD [facility]  Phone Number: [phone]

## 2024-03-23 NOTE — Telephone Encounter (Signed)
 [facility] pharmacy is requesting refill amLODipine (NORVASC) 5 MG tablet   Please advise

## 2024-03-24 ENCOUNTER — Ambulatory Visit: Admitting: Family Medicine

## 2024-03-28 ENCOUNTER — Ambulatory Visit (INDEPENDENT_AMBULATORY_CARE_PROVIDER_SITE_OTHER): Admitting: Family Medicine

## 2024-03-28 ENCOUNTER — Telehealth: Payer: Self-pay | Admitting: Family Medicine

## 2024-03-28 ENCOUNTER — Encounter: Payer: Self-pay | Admitting: Family Medicine

## 2024-03-28 DIAGNOSIS — E1165 Type 2 diabetes mellitus with hyperglycemia: Secondary | ICD-10-CM | POA: Diagnosis not present

## 2024-03-28 NOTE — Patient Instructions (Addendum)
 Please kep in mind normal fasting blood sugar is considered to be in between is 80-100. Dr. David Escort wants yours <120 ideally (goal to work toward) and Post-prandial (2 hours after eating) <180. If you have any questions please let us  know. I have also attached a video ofhow to use your glucometer if you need to see again at a later time.   How to Use a Glucose Meter In this video, you will learn how to test your blood sugar at home using a device called a glucose meter or glucometer. To view the content, go to this web address: https://pe.elsevier.com/h9963EHP  This video will expire on: 11/11/2025. If you need access to this video following this date, please reach out to the healthcare provider who assigned it to you. This information is not intended to replace advice given to you by your health care provider. Make sure you discuss any questions you have with your health care provider. Elsevier Patient Education  2024 [employer].

## 2024-03-28 NOTE — Progress Notes (Signed)
 Nurse visit only  Chief Complaint  Patient presents with   Diabetes    Pt is present to be instructed on how to use his glucometer to check his daily blood sugars.      Glucometer training. Gave expected/goal ranges of blood sugar:  normal fasting is 80-100. want him <120 ideally (goal to work toward). Post-prandial (2 hours after eating) <180   Thierno Hun, Stan Eans, MD, MPH [facility]

## 2024-03-28 NOTE — Telephone Encounter (Signed)
 [facility] pharmacy faxed refill request for the following medications:   sitaGLIPtin  (JANUVIA ) 100 MG tablet    Please advise

## 2024-03-28 NOTE — Addendum Note (Signed)
 Addended by: Darrow End on: 03/28/2024 05:22 PM   Modules accepted: Orders

## 2024-03-29 ENCOUNTER — Encounter: Payer: Self-pay | Admitting: Podiatry

## 2024-03-29 ENCOUNTER — Ambulatory Visit: Payer: Self-pay | Admitting: Urology

## 2024-03-29 ENCOUNTER — Ambulatory Visit: Admitting: Podiatry

## 2024-03-29 ENCOUNTER — Telehealth: Payer: Self-pay | Admitting: Family Medicine

## 2024-03-29 DIAGNOSIS — M216X9 Other acquired deformities of unspecified foot: Secondary | ICD-10-CM | POA: Diagnosis not present

## 2024-03-29 NOTE — Telephone Encounter (Signed)
 Spoke with [place] at pharmacy who confirmed recent rx picked up and that rx was received on 03/22/24  She reports request in system will be cancelled

## 2024-03-29 NOTE — Progress Notes (Signed)
 Chief Complaint  Patient presents with   Diabetic Ulcer    "I want him to look at both feet today.  The other one needs some attention too.  My right one is looking much better."    Subjective:  68 y.o. male with PMHx of diabetes mellitus presenting today for follow-up evaluation of a fissure ulcer to the lateral aspect of the right foot.  Calluses are stable   Past Medical History:  Diagnosis Date   Allergy    Anxiety    Arthritis    BPH (benign prostatic hyperplasia)    Bronchitis    Complication of anesthesia    hard to wake up   DM (diabetes mellitus), type 2 (HCC)    DVT (deep venous thrombosis) (HCC)    post op   ED (erectile dysfunction)    Erythrocytosis 07/04/2020   GERD (gastroesophageal reflux disease)    Hyperlipemia    Hypertension    Hypogonadism in male    Nocturia    Right ankle instability    Sleep apnea    TB lung, latent    Urinary frequency     Past Surgical History:  Procedure Laterality Date   ANKLE ARTHROSCOPY Right 11/13/2021   Procedure: ANKLE ARTHROSCOPY Arthroscopic Irrigation;  Surgeon: Floyce Hutching, DPM;  Location: [facility];  Service: Podiatry;  Laterality: Right;   ANKLE SURGERY Left    torn ligament   CARPAL TUNNEL RELEASE Right 08/2019   COLONOSCOPY  2009   COLONOSCOPY WITH PROPOFOL  N/A 01/23/2022   Procedure: COLONOSCOPY WITH PROPOFOL ;  Surgeon: Marnee Sink, MD;  Location: [facility];  Service: Endoscopy;  Laterality: N/A;   HERNIA REPAIR     IRRIGATION AND DEBRIDEMENT ABSCESS Right 11/13/2021   Procedure: IRRIGATION AND DEBRIDEMENT ABSCESS;  Surgeon: Floyce Hutching, DPM;  Location: [facility];  Service: Podiatry;  Laterality: Right;   IRRIGATION AND DEBRIDEMENT FOOT Right 11/16/2021   Procedure: IRRIGATION AND DEBRIDEMENT ANKLE;  Surgeon: Dot Gazella, DPM;  Location: [facility];  Service: Podiatry;  Laterality: Right;   KNEE ARTHROSCOPY Left    right ankle ruptured tendon surgery  2008   TRANSURETHRAL RESECTION OF  PROSTATE N/A 02/19/2018   Procedure: TRANSURETHRAL RESECTION OF THE PROSTATE (TURP);  Surgeon: Bart Born, MD;  Location: [facility];  Service: Urology;  Laterality: N/A;    Allergies  Allergen Reactions   Penicillins Hives    Has patient had a PCN reaction causing immediate rash, facial/tongue/throat swelling, SOB or lightheadedness with hypotension: yes Has patient had a PCN reaction causing severe rash involving mucus membranes or skin necrosis: no Has patient had a PCN reaction that required hospitalization: no Has patient had a PCN reaction occurring within the last 10 years: no If all of the above answers are "NO", then may proceed with Cephalosporin use.      Objective/Physical Exam General: The patient is alert and oriented x3 in no acute distress.  Dermatology:  Significant pigment.  No open wounds noted.  Calluses noted to the lateral aspect of the fifth metatarsal tubercle and plantar aspect of the fifth MTP left foot  Vascular: Palpable pedal pulses bilaterally. No edema or erythema noted. Capillary refill within normal limits.  Neurological: Grossly intact via light touch  Musculoskeletal Exam: Rigid cavovarus deformity bilateral feet with lateral column loading with weightbearing.  This deformity is what is contributing to the pinching of the skin creating the callus and fissure  Assessment: 1.  Ulcer/left skin lateral aspect of the right  foot just cephalad to the fifth metatarsal tubercle 2. diabetes mellitus w/ peripheral neuropathy 3. Cavovarus deformity bilateral feet  4.  PSxHx lateral ankle stabilization procedure 09/05/2021 with secondary infection  Plan of Care:  -Patient was evaluated. - Light debridement of the callus was performed today using a 312 scalpel and tissue nipper.  Patient tolerated this well. -Continue lotion or Silvadene  cream as needed -Continue hightop sneakers that provide stability to the ankles -Return to clinic as  needed    Dot Gazella, DPM [facility]  Dr. Dot Gazella, DPM    [name] [place], [place] [address]                Office [phone]  Fax [phone]

## 2024-03-29 NOTE — Telephone Encounter (Signed)
 [facility] pharmacy is requesting refill sitaGLIPtin  (JANUVIA ) 100 MG tablet  Please advise

## 2024-04-05 ENCOUNTER — Ambulatory Visit: Payer: Medicare Other | Admitting: Family Medicine

## 2024-04-12 ENCOUNTER — Other Ambulatory Visit: Payer: Self-pay | Admitting: Family Medicine

## 2024-04-18 ENCOUNTER — Other Ambulatory Visit: Payer: Self-pay | Admitting: Pharmacist

## 2024-04-18 NOTE — Progress Notes (Signed)
   04/18/2024  Patient ID: Jonathan Fields, male   DOB: 30-Dec-1955, 68 y.o.   MRN: [id number]  Called and spoke with the patient on the phone today. Reports he still was NOT able to get glucometer to work. Just got a new one yesterday to see if that works better. Tried it and the device timed out while trying to get enough blood. Said currently issue is still getting enough blood based to test with.   Advised to bring supplies tomorrow to see if Dr. B or a nurse can help with demonstration once more.   Next options would be getting ReliOn Micro glucometer from [facility] (smaller sample needed) or trying CGM. If A1c comes back good, may not need to check at all going forward for now.   Requested call in 1 week to see where he's at during that timeframe with device/new A1c.   Delvin File, PharmD [facility]  Phone Number: [phone]

## 2024-04-19 ENCOUNTER — Other Ambulatory Visit: Payer: Self-pay | Admitting: Family Medicine

## 2024-04-19 ENCOUNTER — Ambulatory Visit: Admitting: Family Medicine

## 2024-04-19 DIAGNOSIS — E1165 Type 2 diabetes mellitus with hyperglycemia: Secondary | ICD-10-CM

## 2024-04-22 ENCOUNTER — Other Ambulatory Visit: Payer: Self-pay | Admitting: Family Medicine

## 2024-04-26 ENCOUNTER — Other Ambulatory Visit: Payer: Self-pay | Admitting: Pharmacist

## 2024-04-26 DIAGNOSIS — E1165 Type 2 diabetes mellitus with hyperglycemia: Secondary | ICD-10-CM

## 2024-04-26 MED ORDER — TRUE METRIX METER W/DEVICE KIT: PACK | 0 refills | Status: DC

## 2024-04-26 MED ORDER — TRUE METRIX BLOOD GLUCOSE TEST VI STRP: ORAL_STRIP | 11 refills | Status: DC

## 2024-04-26 NOTE — Addendum Note (Signed)
 Addended by: Eldredge Veldhuizen M on: 04/26/2024 04:19 PM   Modules accepted: Orders

## 2024-04-26 NOTE — Progress Notes (Signed)
   04/26/2024  Patient ID: Jonathan Fields, male   DOB: Aug 19, 1956, 68 y.o.   MRN: [id number]  Patient reports he has still tried every day with the meter and is unable to get readings.   Advised we will send the [employer] meter to [facility]. Will be cash paying, but willing to try anything else at this rate.   Scheduled to meet with me in-person again on next Wednesday at 4PM to review the meter.    Delvin File, PharmD [facility]  Phone Number: [phone]

## 2024-04-27 ENCOUNTER — Other Ambulatory Visit: Payer: Self-pay

## 2024-04-27 ENCOUNTER — Other Ambulatory Visit: Payer: Self-pay | Admitting: Family Medicine

## 2024-04-27 DIAGNOSIS — E1159 Type 2 diabetes mellitus with other circulatory complications: Secondary | ICD-10-CM

## 2024-04-27 NOTE — Telephone Encounter (Signed)
Converted to ref req

## 2024-04-27 NOTE — Telephone Encounter (Signed)
 [facility] pharmacy is requesting refill lisinopril (ZESTRIL) 40 MG tablet   Please advise

## 2024-04-28 MED ORDER — LISINOPRIL 40 MG PO TABS: 40.0000 mg | ORAL_TABLET | Freq: Every day | ORAL | 1 refills | Status: DC

## 2024-05-02 ENCOUNTER — Telehealth: Payer: Self-pay

## 2024-05-02 NOTE — Telephone Encounter (Signed)
 Called and spoke with the patient on the phone. Reports he went to the pharmacy to get glucometer True Metrix today and they did not have it. Advised I will give them a call to see what is happening with the meter.

## 2024-05-02 NOTE — Telephone Encounter (Signed)
 Called the pharmacy. [facility] is filling their brand of True Metrix strips with a discount for $35 and he will need to get the meter from the pharmacy. They will start working to fill it now. The True Metrix meter will come from the front of the pharmacy and cannot be billed per the technician.   Called patient and discussed the above. Advised he needs the new test strips to go with the new meter. Confirmed understanding. Meter should be about $14 to $20. Will call back if any further concerns and I will see him Wednesday at the office.

## 2024-05-02 NOTE — Telephone Encounter (Signed)
 Copied from CRM [id number]. Topic: General - Other >> May 02, 2024  9:29 AM Everette C wrote: Reason for CRM: The patient is requesting to be contacted by Clementeen Custard, PharmD when possible

## 2024-05-04 ENCOUNTER — Other Ambulatory Visit: Payer: Self-pay | Admitting: Pharmacist

## 2024-05-04 NOTE — Progress Notes (Signed)
   05/04/2024  Patient ID: Jonathan Fields, male   DOB: [birth year], 68 y.o.   MRN: [id number]  Met with the patient in-office today. Brought True Metrix meter supplies.  Was able to use device and get is set-up. Patient has been smearing the blood on the strip and getting an error everytime instead of gently tapping at the bottom for it to suck the blood up. Confirmed understanding with difference in how to get readings now.  Told to place device on flat surface and bring his finger + blood to the strip to gently tap it.   Reading was 126 while in the office today. Advised this is at goal.  Will check every day or every other day readings and write them down for now.   Follow-up in 3 weeks to see how readings look.   Delvin File, PharmD [facility]  Phone Number: [phone]

## 2024-05-06 DIAGNOSIS — M19012 Primary osteoarthritis, left shoulder: Secondary | ICD-10-CM | POA: Diagnosis not present

## 2024-05-18 ENCOUNTER — Encounter: Payer: Self-pay | Admitting: Oncology

## 2024-05-18 ENCOUNTER — Inpatient Hospital Stay (HOSPITAL_BASED_OUTPATIENT_CLINIC_OR_DEPARTMENT_OTHER): Payer: Medicare Other | Admitting: Oncology

## 2024-05-18 ENCOUNTER — Inpatient Hospital Stay: Payer: Medicare Other

## 2024-05-18 ENCOUNTER — Inpatient Hospital Stay: Payer: Medicare Other | Attending: Oncology

## 2024-05-18 VITALS — BP 125/77 | HR 87 | Resp 14

## 2024-05-18 VITALS — BP 120/79 | HR 92 | Temp 97.9°F | Resp 14 | Ht 74.0 in | Wt 225.2 lb

## 2024-05-18 DIAGNOSIS — Z79899 Other long term (current) drug therapy: Secondary | ICD-10-CM | POA: Diagnosis not present

## 2024-05-18 DIAGNOSIS — D751 Secondary polycythemia: Secondary | ICD-10-CM

## 2024-05-18 LAB — CBC WITH DIFFERENTIAL (CANCER CENTER ONLY)
Abs Immature Granulocytes: 0.07 10*3/uL (ref 0.00–0.07)
Basophils Absolute: 0 10*3/uL (ref 0.0–0.1)
Basophils Relative: 1 %
Eosinophils Absolute: 0.1 10*3/uL (ref 0.0–0.5)
Eosinophils Relative: 2 %
HCT: 50.5 % (ref 39.0–52.0)
Hemoglobin: 16.9 g/dL (ref 13.0–17.0)
Immature Granulocytes: 1 %
Lymphocytes Relative: 20 %
Lymphs Abs: 1.4 10*3/uL (ref 0.7–4.0)
MCH: 27.8 pg (ref 26.0–34.0)
MCHC: 33.5 g/dL (ref 30.0–36.0)
MCV: 83.1 fL (ref 80.0–100.0)
Monocytes Absolute: 0.7 10*3/uL (ref 0.1–1.0)
Monocytes Relative: 10 %
Neutro Abs: 4.5 10*3/uL (ref 1.7–7.7)
Neutrophils Relative %: 66 %
Platelet Count: 211 10*3/uL (ref 150–400)
RBC: 6.08 MIL/uL — ABNORMAL HIGH (ref 4.22–5.81)
RDW: 13.7 % (ref 11.5–15.5)
WBC Count: 6.8 10*3/uL (ref 4.0–10.5)
nRBC: 0 % (ref 0.0–0.2)

## 2024-05-18 MED ORDER — SODIUM CHLORIDE 0.9 % IV SOLN
Freq: Once | INTRAVENOUS | Status: DC
Start: 2024-05-18 — End: 2024-05-18
  Filled 2024-05-18: qty 250

## 2024-05-18 NOTE — Patient Instructions (Signed)

## 2024-05-18 NOTE — Progress Notes (Signed)
 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 Labs are reviewed and discussed with patient. Hct 50.5 Will proceed with phlebotomy.    Orders Placed This Encounter  Procedures   CBC with Differential (Cancer Center Only)    Standing Status:   Future    Expected Date:   11/17/2024    Expiration Date:   02/15/2025   Follow up  6 months lab MD +/- Phleb cbc   All questions were answered. The patient knows to call the clinic with any problems, questions or concerns.  Timmy Forbes, MD, PhD [facility] Hematology Oncology 05/18/2024   HISTORY OF PRESENTING ILLNESS:  Jonathan Fields is a 68 y.o. male who was seen in consultation at the request of David Escort, Stan Eans, MD for evaluation of polycytosis/erythrocytosis Reviewed patient's recent lab work 03/14/2020, hematocrit 55, 09/13/2019 hematocrit 51.8, 07/22/2019, hematocrit 49.1.  Hemoglobin 17.2.   Patient is on testosterone  replacement.  Currently Dr. Cherylene Corrente hold off treatments due to erythrocytosis. Patient was is Dr. Elene Griffes to hematology oncology for evaluation and management. Associated signs or symptoms: Patient mention about 15 pounds of unintentional weight loss recently during the past few weeks.  His appetite is unchanged.  He feels that he has eaten similar amount of food recently.  Denies any increase of activity level.  Some sweating for the past 2 days.  Context:  Smoking history: Denies. Testosterone  supplements: History of blood clots: Previously he has an episode of provoked thrombosis after surgery. Daytime somnolence: Denies Family history of polycythemia: Denies  Patient underwent stabilization of right foot complicated by wound dehiscence, septic arthritis of the right ankle, acute  osteomyelitis.  Patient was taken to the OR for washout and debridement in December 2022.  Finished antibiotic course.  03/17/2022 for Sclerotherapy for symptomatic varicose veins of the right lower extremity.  INTERVAL HISTORY Jonathan Fields is a 68 y.o. male who has above history reviewed by me today presents for follow up visit for management of secondary erythrocytosis due to testosterone  use Patient is on testosterone  replacement therapy Patient has no new complaints. Review of Systems  Constitutional:  Negative for appetite change, chills, fatigue, fever and unexpected weight change.  HENT:   Negative for hearing loss and voice change.   Eyes:  Negative for eye problems and icterus.  Respiratory:  Negative for chest tightness, cough and shortness of breath.   Cardiovascular:  Negative for chest pain and leg swelling.  Gastrointestinal:  Negative for abdominal distention and abdominal pain.  Endocrine: Negative for hot flashes.  Genitourinary:  Negative for difficulty urinating, dysuria and frequency.   Musculoskeletal:  Negative for arthralgias.  Skin:  Negative for itching and rash.  Neurological:  Negative for light-headedness and numbness.  Hematological:  Negative for adenopathy. Does not bruise/bleed easily.  Psychiatric/Behavioral:  Negative for confusion.     MEDICAL HISTORY:  Past Medical History:  Diagnosis Date   Allergy    Anxiety    Arthritis    BPH (benign prostatic hyperplasia)    Bronchitis    Complication of anesthesia    hard to wake up   DM (diabetes mellitus), type 2 (HCC)    DVT (deep venous thrombosis) (HCC)    post op   ED (erectile dysfunction)    Erythrocytosis 07/04/2020   GERD (gastroesophageal reflux disease)  Hyperlipemia    Hypertension    Hypogonadism in male    Nocturia    Right ankle instability    Sleep apnea    TB lung, latent    Urinary frequency     SURGICAL HISTORY: Past Surgical History:  Procedure Laterality Date    ANKLE ARTHROSCOPY Right 11/13/2021   Procedure: ANKLE ARTHROSCOPY Arthroscopic Irrigation;  Surgeon: Floyce Hutching, DPM;  Location: [facility];  Service: Podiatry;  Laterality: Right;   ANKLE SURGERY Left    torn ligament   CARPAL TUNNEL RELEASE Right 08/2019   COLONOSCOPY  2009   COLONOSCOPY WITH PROPOFOL  N/A 01/23/2022   Procedure: COLONOSCOPY WITH PROPOFOL ;  Surgeon: Marnee Sink, MD;  Location: [facility];  Service: Endoscopy;  Laterality: N/A;   HERNIA REPAIR     IRRIGATION AND DEBRIDEMENT ABSCESS Right 11/13/2021   Procedure: IRRIGATION AND DEBRIDEMENT ABSCESS;  Surgeon: Floyce Hutching, DPM;  Location: [facility];  Service: Podiatry;  Laterality: Right;   IRRIGATION AND DEBRIDEMENT FOOT Right 11/16/2021   Procedure: IRRIGATION AND DEBRIDEMENT ANKLE;  Surgeon: Dot Gazella, DPM;  Location: [facility];  Service: Podiatry;  Laterality: Right;   KNEE ARTHROSCOPY Left    right ankle ruptured tendon surgery  2008   TRANSURETHRAL RESECTION OF PROSTATE N/A 02/19/2018   Procedure: TRANSURETHRAL RESECTION OF THE PROSTATE (TURP);  Surgeon: Bart Born, MD;  Location: [facility];  Service: Urology;  Laterality: N/A;    SOCIAL HISTORY: Social History   Socioeconomic History   Marital status: Married    Spouse name: Not on file   Number of children: 0   Years of education: Not on file   Highest education level: Associate degree: occupational, Scientist, product/process development, or vocational program  Occupational History   Not on file  Tobacco Use   Smoking status: Never   Smokeless tobacco: Current    Types: Snuff   Tobacco comments:    tobaco pouches that produce juice, dip occasionally  Vaping Use   Vaping status: Never Used  Substance and Sexual Activity   Alcohol use: Not Currently    Comment: COUPLE OF DRINKS EVERY WEEK   Drug use: No   Sexual activity: Yes    Partners: Female    Birth control/protection: None  Other Topics Concern   Not on file  Social History Narrative   Not on file    Social Drivers of Health   Financial Resource Strain: Low Risk  (01/07/2024)   Overall Financial Resource Strain (CARDIA)    Difficulty of Paying Living Expenses: Not hard at all  Food Insecurity: No Food Insecurity (01/07/2024)   Hunger Vital Sign    Worried About Running Out of Food in the Last Year: Never true    Ran Out of Food in the Last Year: Never true  Transportation Needs: No Transportation Needs (01/07/2024)   PRAPARE - Administrator, Civil Service (Medical): No    Lack of Transportation (Non-Medical): No  Physical Activity: Sufficiently Active (01/07/2024)   Exercise Vital Sign    Days of Exercise per Week: 5 days    Minutes of Exercise per Session: 60 min  Stress: Stress Concern Present (01/07/2024)   [employer] of Occupational Health - Occupational Stress Questionnaire    Feeling of Stress : To some extent  Social Connections: Moderately Integrated (01/07/2024)   Social Connection and Isolation Panel    Frequency of Communication with Friends and Family: Three times a week    Frequency of Social Gatherings with  Friends and Family: Once a week    Attends Religious Services: More than 4 times per year    Active Member of [employer] or Organizations: No    Attends Engineer, structural: Not on file    Marital Status: Married  Catering manager Violence: Not At Risk (01/07/2024)   Humiliation, Afraid, Rape, and Kick questionnaire    Fear of Current or Ex-Partner: No    Emotionally Abused: No    Physically Abused: No    Sexually Abused: No    FAMILY HISTORY: Family History  Problem Relation Age of Onset   Heart disease Father    Hypertension Father    Prostate cancer Father    Heart disease Mother    Breast cancer Mother    Colon cancer Neg Hx     ALLERGIES:  is allergic to penicillins.  MEDICATIONS:  Current Outpatient Medications  Medication Sig Dispense Refill   alfuzosin  (UROXATRAL ) 10 MG 24 hr tablet TAKE 1 TABLET(10 MG) BY MOUTH IN THE  MORNING AND AT BEDTIME 60 tablet 0   amLODipine  (NORVASC ) 5 MG tablet TAKE 1 TABLET(5 MG) BY MOUTH DAILY 30 tablet 1   atorvastatin  (LIPITOR) 40 MG tablet Take 1 tablet (40 mg total) by mouth daily. 90 tablet 3   Blood Glucose Monitoring Suppl (ONE TOUCH ULTRA 2) w/Device KIT Use as directed to check blood sugar daily. Dx E11.65 1 kit 0   Blood Glucose Monitoring Suppl (TRUE METRIX METER) w/Device KIT Use as directed to check blood sugar daily. 1 kit 0   celecoxib (CELEBREX) 200 MG capsule Take 200 mg by mouth 2 (two) times daily.     Cholecalciferol (VITAMIN D3) 50 MCG (2000 UT) capsule Take 2,000 Units by mouth daily.     dapagliflozin  propanediol (FARXIGA ) 10 MG TABS tablet Take 1 tablet (10 mg total) by mouth daily. 90 tablet 0   fluticasone  (FLONASE ) 50 MCG/ACT nasal spray SHAKE LIQUID AND USE 2 SPRAYS IN EACH NOSTRIL DAILY 48 g 1   gabapentin  (NEURONTIN ) 300 MG capsule TAKE 1 CAPSULE(300 MG) BY MOUTH AT BEDTIME 90 capsule 1   glucose blood (ONETOUCH ULTRA TEST) test strip Use as directed to check blood sugar daily. Dx E11.65 100 each 3   glucose blood (TRUE METRIX BLOOD GLUCOSE TEST) test strip Use as instructed to check blood sugar daily. Trying a new meter- not covered by insurance. 100 each 11   Lancets (EMBRACE PRESSURE ACTIVATED 28G) MISC Use to check blood sugar daily. Dx E11.65 100 each 0   Lancets (ONETOUCH DELICA PLUS LANCET33G) MISC Use as directed to check blood sugar daily. Dx E11.65 100 each 3   Lancets 28G Thin MISC 1 Lancet by Does not apply route daily. Dx E11.65 50 each 2   lisinopril  (ZESTRIL ) 40 MG tablet Take 1 tablet (40 mg total) by mouth daily. 90 tablet 1   meloxicam (MOBIC) 15 MG tablet Take 15 mg by mouth daily.     metFORMIN  (GLUCOPHAGE -XR) 500 MG 24 hr tablet TAKE 2 TABLETS(1000 MG) BY MOUTH TWICE DAILY WITH A MEAL 360 tablet 0   NEEDLE, DISP, 18 G (BD DISP NEEDLES) 18G X 1-1/2 MISC 1 mg by Does not apply route every 14 (fourteen) days. 50 each 0   NEEDLE, DISP,  21 G (BD SAFETYGLIDE SHIELDED NEEDLE) 21G X 1-1/2 MISC Use 21guage needle to inject Testosterone  Cypionate 1ml. 25 each 0   omeprazole (PRILOSEC) 20 MG capsule Take 20 mg by mouth daily.     Protein  POWD Take 1 Scoop by mouth daily.     silver  sulfADIAZINE  (SILVADENE ) 1 % cream Apply 1 Application topically daily. 50 g 1   sitaGLIPtin  (JANUVIA ) 100 MG tablet Take 1 tablet (100 mg total) by mouth daily. 30 tablet 2   Syringe, Disposable, (2-3CC SYRINGE) 3 ML MISC 1 mg by Does not apply route every 14 (fourteen) days. 25 each 3   Syringe/Needle, Disp, (SYRINGE 3CC/23GX1) 23G X 1 3 ML MISC Use as directed 50 each 0   testosterone  cypionate (DEPOTESTOSTERONE CYPIONATE) 200 MG/ML injection INJECT 0.5 MLS IN THE MUSCLE EVERY WEEK. DISCARD REMAINDER OF VIAL 10 mL 0   No current facility-administered medications for this visit.     PHYSICAL EXAMINATION: ECOG PERFORMANCE STATUS: 0 - Asymptomatic Vitals:   05/18/24 1446  BP: 120/79  Pulse: 92  Resp: 14  Temp: 97.9 F (36.6 C)  SpO2: 97%   Filed Weights   05/18/24 1446  Weight: 225 lb 3.2 oz (102.2 kg)    Physical Exam Constitutional:      General: He is not in acute distress. HENT:     Head: Normocephalic and atraumatic.   Eyes:     General: No scleral icterus.   Cardiovascular:     Rate and Rhythm: Normal rate.  Pulmonary:     Effort: Pulmonary effort is normal. No respiratory distress.  Abdominal:     General: There is no distension.     Palpations: Abdomen is soft.   Musculoskeletal:        General: Normal range of motion.     Cervical back: Normal range of motion and neck supple.   Skin:    General: Skin is warm and dry.   Neurological:     Mental Status: He is alert and oriented to person, place, and time. Mental status is at baseline.   Psychiatric:        Mood and Affect: Mood normal.     RADIOGRAPHIC STUDIES: I have personally reviewed the radiological images as listed and agreed with the findings in  the report. No results found.   LABORATORY DATA:  I have reviewed the data as listed    Latest Ref Rng & Units 05/18/2024    2:23 PM 11/18/2023    1:41 PM 07/27/2023   12:41 PM  CBC  WBC 4.0 - 10.5 K/uL 6.8  6.4  5.0   Hemoglobin 13.0 - 17.0 g/dL 86.5  78.4  69.6   Hematocrit 39.0 - 52.0 % 50.5  50.5  48.1   Platelets 150 - 400 K/uL 211  235  178       Latest Ref Rng & Units 01/07/2024    3:36 PM 10/06/2023    3:38 PM 03/31/2023    3:56 PM  CMP  Glucose 70 - 99 mg/dL 295  284  132   BUN 8 - 27 mg/dL 13  23  15    Creatinine 0.76 - 1.27 mg/dL 4.40  1.02  7.25   Sodium 134 - 144 mmol/L 143  137  139   Potassium 3.5 - 5.2 mmol/L 4.4  4.6  4.1   Chloride 96 - 106 mmol/L 104  100  103   CO2 20 - 29 mmol/L 24  19  19    Calcium  8.6 - 10.2 mg/dL 9.6  9.4  9.4   Total Protein 6.0 - 8.5 g/dL  6.8  6.2   Total Bilirubin 0.0 - 1.2 mg/dL  1.1  0.9   Alkaline Phos 44 -  121 IU/L  91  86   AST 0 - 40 IU/L  15  20   ALT 0 - 44 IU/L  22  24

## 2024-05-18 NOTE — Progress Notes (Signed)
 Jonathan Fields presents today for phlebotomy per MD orders. Phlebotomy procedure started at 1508 and ended at 1520. 500 mls removed. Patient tolerated procedure well. IV needle removed intact.

## 2024-05-18 NOTE — Assessment & Plan Note (Signed)
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 Labs are reviewed and discussed with patient. Hct 50.5 Will proceed with phlebotomy.

## 2024-05-19 ENCOUNTER — Other Ambulatory Visit: Payer: Self-pay | Admitting: Urology

## 2024-05-19 DIAGNOSIS — N401 Enlarged prostate with lower urinary tract symptoms: Secondary | ICD-10-CM

## 2024-05-25 ENCOUNTER — Other Ambulatory Visit: Payer: Self-pay | Admitting: Pharmacist

## 2024-05-25 ENCOUNTER — Other Ambulatory Visit: Payer: Self-pay | Admitting: Family Medicine

## 2024-05-25 NOTE — Progress Notes (Signed)
   05/25/2024  Patient ID: Jonathan Fields, male   DOB: 1956-07-01, 68 y.o.   MRN: [id number]  Tried calling patient. Unable to leave voicemail at this time due to mailbox being full. Will try again in 1 week.    Aloysius Lewis, PharmD [facility]  Phone Number: [phone]

## 2024-05-26 ENCOUNTER — Other Ambulatory Visit: Payer: Self-pay | Admitting: Family Medicine

## 2024-05-26 NOTE — Telephone Encounter (Signed)
 Copied from CRM [id number]. Topic: Appointments - Scheduling Inquiry for Clinic >> May 25, 2024  4:09 PM Winona R wrote: Pt calling to resch his missed Patient outreach 30 min appointment. Please assist with rescheduling

## 2024-05-27 NOTE — Progress Notes (Signed)
 Pt advised. Verbalized understanding and will wait to hear from Dr.B

## 2024-05-30 NOTE — Progress Notes (Signed)
 Agree with adding Glipizide XR 5 mg daily #30 r2 (please send Rx and let patient know). Watch out for hypoglycemia symptoms. Needs to eat after taking glipizide each morning.

## 2024-05-31 MED ORDER — GLIPIZIDE 5 MG PO TABS: 5.0000 mg | ORAL_TABLET | Freq: Two times a day (BID) | ORAL | 2 refills | Status: DC

## 2024-05-31 NOTE — Progress Notes (Signed)
 Pt advised. Verbalized understanding.

## 2024-05-31 NOTE — Addendum Note (Signed)
 Addended by: LILIAN SEVERO RAMAN on: 05/31/2024 01:20 PM   Modules accepted: Orders

## 2024-06-14 ENCOUNTER — Other Ambulatory Visit: Payer: Self-pay | Admitting: Family Medicine

## 2024-06-20 ENCOUNTER — Encounter: Payer: Self-pay | Admitting: Oncology

## 2024-06-23 ENCOUNTER — Ambulatory Visit (INDEPENDENT_AMBULATORY_CARE_PROVIDER_SITE_OTHER): Admitting: Family Medicine

## 2024-06-23 ENCOUNTER — Encounter: Payer: Self-pay | Admitting: Family Medicine

## 2024-06-23 VITALS — BP 119/84 | HR 104 | Temp 98.1°F | Ht 74.0 in | Wt 229.9 lb

## 2024-06-23 DIAGNOSIS — E1159 Type 2 diabetes mellitus with other circulatory complications: Secondary | ICD-10-CM | POA: Diagnosis not present

## 2024-06-23 DIAGNOSIS — I152 Hypertension secondary to endocrine disorders: Secondary | ICD-10-CM | POA: Diagnosis not present

## 2024-06-23 DIAGNOSIS — G5601 Carpal tunnel syndrome, right upper limb: Secondary | ICD-10-CM | POA: Diagnosis not present

## 2024-06-23 DIAGNOSIS — Z7984 Long term (current) use of oral hypoglycemic drugs: Secondary | ICD-10-CM

## 2024-06-23 DIAGNOSIS — E785 Hyperlipidemia, unspecified: Secondary | ICD-10-CM | POA: Diagnosis not present

## 2024-06-23 DIAGNOSIS — E1169 Type 2 diabetes mellitus with other specified complication: Secondary | ICD-10-CM | POA: Diagnosis not present

## 2024-06-23 DIAGNOSIS — E1165 Type 2 diabetes mellitus with hyperglycemia: Secondary | ICD-10-CM

## 2024-06-23 MED ORDER — ACCU-CHEK GUIDE ME W/DEVICE KIT: PACK | 0 refills | Status: AC

## 2024-06-23 MED ORDER — SYRINGE 23G X 1" 3 ML MISC: 0 refills | Status: AC

## 2024-06-23 MED ORDER — ACCU-CHEK GUIDE TEST VI STRP: ORAL_STRIP | 12 refills | Status: DC

## 2024-06-23 MED ORDER — ACCU-CHEK SOFTCLIX LANCETS MISC: 12 refills | Status: DC

## 2024-06-23 NOTE — Progress Notes (Unsigned)
 Established Patient Office Visit  Subjective   Patient ID: Jonathan Fields, male    DOB: 1955/12/20  Age: 68 y.o. MRN: [id number]  Chief Complaint  Patient presents with   Medical Management of Chronic Issues    DM2    Jonathan Fields is a 68 yo male with a history of HTN, T2DM, and HLD who presents to the clinic today for follow up management of chronic conditions.  On interview, he reports having good control his blood pressure with his current medication regimen. He is not measure his pressures at home. He denies any symptoms or adverse medication side effects. He also reports having better control of his diabetes at home with his updated medication regimen. He denies any medication side effects, but reports experiencing brief periods of lightheadedness and dizziness every two to three days in the afternoon. Additionally, he reports managing his hyperlipidemia with medication every other day. He reports occasionally forgetting to take his medication.  His only other concern today is a long-standing history of numbness in his fingertips, particularly in the median fingers of his right hand    {History (Optional):23778}  ROS    Objective:     BP 119/84 (BP Location: Left Arm, Patient Position: Sitting, Cuff Size: Normal)   Pulse (!) 104   Temp 98.1 F (36.7 C) (Oral)   Ht 6' 2 (1.88 m)   Wt 229 lb 14.4 oz (104.3 kg)   SpO2 97%   BMI 29.52 kg/m  {Vitals History (Optional):23777}  Physical Exam Vitals reviewed.  Constitutional:      General: He is not in acute distress.    Appearance: Normal appearance. He is not ill-appearing or diaphoretic.  HENT:     Right Ear: Tympanic membrane, ear canal and external ear normal.     Left Ear: Tympanic membrane, ear canal and external ear normal.     Nose: Nose normal.     Mouth/Throat:     Mouth: Mucous membranes are moist.     Pharynx: Oropharynx is clear. No oropharyngeal exudate or posterior oropharyngeal erythema.  Eyes:      General: No scleral icterus.    Conjunctiva/sclera: Conjunctivae normal.     Pupils: Pupils are equal, round, and reactive to light.  Cardiovascular:     Rate and Rhythm: Normal rate and regular rhythm.     Pulses: Normal pulses.     Heart sounds: Normal heart sounds. No murmur heard.    No friction rub. No gallop.  Pulmonary:     Effort: Pulmonary effort is normal. No respiratory distress.     Breath sounds: Normal breath sounds. No wheezing.  Abdominal:     General: Bowel sounds are normal. There is no distension.     Palpations: Abdomen is soft.     Tenderness: There is no abdominal tenderness. There is no guarding.  Musculoskeletal:     Right shoulder: Normal.     Left shoulder: No swelling. Decreased range of motion (chronic history because of rotator cuff).     Cervical back: Normal range of motion.     Right lower leg: No edema.     Left lower leg: No edema.  Lymphadenopathy:     Cervical: No cervical adenopathy.  Skin:    General: Skin is warm and dry.     Capillary Refill: Capillary refill takes less than 2 seconds.  Neurological:     Mental Status: He is alert and oriented to person, place, and time.  Psychiatric:  Mood and Affect: Mood normal.      No results found for any visits on 06/23/24.  {Labs (Optional):23779}  The 10-year ASCVD risk score (Arnett DK, et al., 2019) is: 37.1%    Assessment & Plan:   Problem List Items Addressed This Visit       Cardiovascular and Mediastinum   Hypertension associated with diabetes (HCC)   Well controlled on current medication regimen compared to previous measurements in the 140s/90s at last visit. Reports taking medication as recommended with no symptoms or adverse medication side effects. Does not measure home pressures, but in office measurement was 119/84. Last CMP was stable - Continue amlodipine  5 mg daily - Continue lisinopril  40 mg daily - Order CMP      Relevant Orders   Comprehensive  metabolic panel with GFR     Endocrine   T2DM (type 2 diabetes mellitus) (HCC) - Primary   Currently better managed with new medication regimen. Last A1C was 7.7, but unable to get POCT A1C today. Reports morning blood sugars have lowered to the 100-120s from the 160s. Made significant dietary changes to help achieve pre-operative A1C goal. Reports experiencing periods of lightheadedness/dizziness in the afternoon even after snacking. Discussed strategy of moving medication to evening instead of morning and evening to help mitigate lightheadedness. - Continue Januvia  100 mg daily, taking at night before dinner - Continue Farxiga  10 mg daily, taking at night before dinner - Continue Glipizide  5 mg daily, taking at night before dinner - Order CMP and A1C, sending A1C to surgeon - Follow up in three months to reevaluate      Relevant Orders   Hemoglobin A1c   Hyperlipidemia associated with type 2 diabetes mellitus (HCC)   Currently controlled on current medication regimen. Reports taking medicine every other day because patient forgets to take on occasion. No other symptoms or side effects of note. Last lipid panel showed elevated cholesterol and triglycerides. - Continue atorvastatin  40 mg daily - Order lipid panel      Relevant Orders   Comprehensive metabolic panel with GFR   Lipid panel     Nervous and Auditory   Carpal tunnel syndrome of right wrist   Numbness is currently managed conservatively. Status post carpal tunnel release with numbness in his right hand in a median nerve distribution after the wrist. Intact strength and sensation. - Continue conservative management of numbness      General Health Maintenance Discussed wellness and preventative health screenings to achieve health maintenance goals. - Encouraged flu and Covid vaccinations in the fall - Requested patient's recent eye exam - Up to date on other wellness measures and preventative screenings   No follow-ups  on file.    Elia LULLA Blanch, Medical Student

## 2024-06-23 NOTE — Assessment & Plan Note (Signed)
 Currently better managed with new medication regimen. Last A1C was 7.7, but unable to get POCT A1C today. Reports morning blood sugars have lowered to the 100-120s from the 160s. Made significant dietary changes to help achieve pre-operative A1C goal. Reports experiencing periods of lightheadedness/dizziness in the afternoon even after snacking. Discussed strategy of moving medication to evening instead of morning and evening to help mitigate lightheadedness. - Continue Januvia  100 mg daily, taking at night before dinner - Continue Farxiga  10 mg daily, taking at night before dinner - Continue Glipizide  5 mg daily, taking at night before dinner - Order CMP and A1C, sending A1C to surgeon - Follow up in three months to reevaluate

## 2024-06-23 NOTE — Assessment & Plan Note (Signed)
 Currently controlled on current medication regimen. Reports taking medicine every other day because patient forgets to take on occasion. No other symptoms or side effects of note. Last lipid panel showed elevated cholesterol and triglycerides. - Continue atorvastatin  40 mg daily - Order lipid panel

## 2024-06-23 NOTE — Assessment & Plan Note (Signed)
 Well controlled on current medication regimen compared to previous measurements in the 140s/90s at last visit. Reports taking medication as recommended with no symptoms or adverse medication side effects. Does not measure home pressures, but in office measurement was 119/84. Last CMP was stable - Continue amlodipine  5 mg daily - Continue lisinopril  40 mg daily - Order CMP

## 2024-06-23 NOTE — Assessment & Plan Note (Signed)
 Numbness is currently managed conservatively. Status post carpal tunnel release with numbness in his right hand in a median nerve distribution after the wrist. Intact strength and sensation. - Continue conservative management of numbness

## 2024-06-24 DIAGNOSIS — E785 Hyperlipidemia, unspecified: Secondary | ICD-10-CM | POA: Diagnosis not present

## 2024-06-24 DIAGNOSIS — E1159 Type 2 diabetes mellitus with other circulatory complications: Secondary | ICD-10-CM | POA: Diagnosis not present

## 2024-06-24 DIAGNOSIS — E1169 Type 2 diabetes mellitus with other specified complication: Secondary | ICD-10-CM | POA: Diagnosis not present

## 2024-06-24 DIAGNOSIS — I152 Hypertension secondary to endocrine disorders: Secondary | ICD-10-CM | POA: Diagnosis not present

## 2024-06-25 LAB — COMPREHENSIVE METABOLIC PANEL WITH GFR
ALT: 25 IU/L (ref 0–44)
AST: 21 IU/L (ref 0–40)
Albumin: 4 g/dL (ref 3.9–4.9)
Alkaline Phosphatase: 73 IU/L (ref 44–121)
BUN/Creatinine Ratio: 24 (ref 10–24)
BUN: 21 mg/dL (ref 8–27)
Bilirubin Total: 0.8 mg/dL (ref 0.0–1.2)
CO2: 20 mmol/L (ref 20–29)
Calcium: 9.3 mg/dL (ref 8.6–10.2)
Chloride: 105 mmol/L (ref 96–106)
Creatinine, Ser: 0.89 mg/dL (ref 0.76–1.27)
Globulin, Total: 2.2 g/dL (ref 1.5–4.5)
Glucose: 145 mg/dL — ABNORMAL HIGH (ref 70–99)
Potassium: 4.1 mmol/L (ref 3.5–5.2)
Sodium: 143 mmol/L (ref 134–144)
Total Protein: 6.2 g/dL (ref 6.0–8.5)
eGFR: 93 mL/min/1.73 (ref >59)

## 2024-06-25 LAB — LIPID PANEL
Chol/HDL Ratio: 5.8 ratio — ABNORMAL HIGH (ref 0.0–5.0)
Cholesterol, Total: 209 mg/dL — ABNORMAL HIGH (ref 100–199)
HDL: 36 mg/dL — ABNORMAL LOW (ref >39)
LDL Chol Calc (NIH): 110 mg/dL — ABNORMAL HIGH (ref 0–99)
Triglycerides: 368 mg/dL — ABNORMAL HIGH (ref 0–149)
VLDL Cholesterol Cal: 63 mg/dL — ABNORMAL HIGH (ref 5–40)

## 2024-06-25 LAB — HEMOGLOBIN A1C
Est. average glucose Bld gHb Est-mCnc: 171 mg/dL
Hgb A1c MFr Bld: 7.6 % — ABNORMAL HIGH (ref 4.8–5.6)

## 2024-06-27 ENCOUNTER — Ambulatory Visit: Payer: Self-pay | Admitting: Family Medicine

## 2024-06-27 ENCOUNTER — Telehealth: Payer: Self-pay

## 2024-06-27 ENCOUNTER — Other Ambulatory Visit: Payer: Self-pay | Admitting: Family Medicine

## 2024-06-27 ENCOUNTER — Other Ambulatory Visit: Payer: Self-pay

## 2024-06-27 DIAGNOSIS — E1165 Type 2 diabetes mellitus with hyperglycemia: Secondary | ICD-10-CM

## 2024-06-27 DIAGNOSIS — E1169 Type 2 diabetes mellitus with other specified complication: Secondary | ICD-10-CM

## 2024-06-27 DIAGNOSIS — Z01818 Encounter for other preprocedural examination: Secondary | ICD-10-CM

## 2024-06-27 MED ORDER — GLIPIZIDE ER 5 MG PO TB24: 5.0000 mg | ORAL_TABLET | Freq: Every day | ORAL | 1 refills | Status: AC

## 2024-06-27 NOTE — Telephone Encounter (Signed)
 Pt advised. Verbalized understanding as well as he would like to still do another A1c in 1 month just in case letter is not sufficient enough for surgery. Reports he has cut back on carb in take, no sweets and getting home reading in low 100's so not sure why A1c is not reflecting that. Asked if he has ever seen nutrition and he has said no but will think about it  FYI

## 2024-06-27 NOTE — Telephone Encounter (Signed)
 Copied from CRM [id number]. Topic: Clinical - Medication Question >> Jun 27, 2024 10:34 AM Nathanel BROCKS wrote: Reason for CRM: is the new medication going to be sent in to pharmacy ( I read him Dr Rona notes) he also wants the letter to be sent in to dr to try to have his surgery.. Please advise pt.

## 2024-06-27 NOTE — Telephone Encounter (Signed)
 Letter sent to Dr Gini.  Rx was sent earlier today by CMA.

## 2024-06-28 ENCOUNTER — Encounter: Payer: Self-pay | Admitting: Family Medicine

## 2024-06-28 NOTE — Addendum Note (Signed)
 Addended by: CHERRY CHIQUITA HERO on: 06/28/2024 08:12 AM   Modules accepted: Orders

## 2024-06-28 NOTE — Telephone Encounter (Signed)
 Ok to place order for 1 month A1c

## 2024-06-28 NOTE — Telephone Encounter (Signed)
 Order placed

## 2024-07-12 NOTE — Telephone Encounter (Unsigned)
 Copied from CRM [id number]. Topic: Clinical - Medication Refill >> Jul 12, 2024  4:38 PM Harlene ORN wrote: Medication: glucose blood (ACCU-CHEK GUIDE TEST) test strip, Lancets (ONETOUCH DELICA PLUS LANCET33G) MISC  Has the patient contacted their pharmacy? No (Agent: If no, request that the patient contact the pharmacy for the refill. If patient does not wish to contact the pharmacy document the reason why and proceed with request.) (Agent: If yes, when and what did the pharmacy advise?)  This is the patient's preferred pharmacy:  [facility][id number] [place], [place] - [address] AT [facility] OF [address] [place] [address] [place] [address] Phone: [phone] Fax: [phone]  Is this the correct pharmacy for this prescription? Yes If no, delete pharmacy and type the correct one.   Has the prescription been filled recently? No  Is the patient out of the medication? No  Has the patient been seen for an appointment in the last year OR does the patient have an upcoming appointment? Yes  Can we respond through MyChart? Yes  Agent: Please be advised that Rx refills may take up to 3 business days. We ask that you follow-up with your pharmacy.

## 2024-07-12 NOTE — Telephone Encounter (Signed)
 Ok to send Rx of testing supplies as requested

## 2024-07-13 ENCOUNTER — Other Ambulatory Visit: Payer: Self-pay

## 2024-07-13 MED ORDER — ACCU-CHEK SOFTCLIX LANCETS MISC: 12 refills | Status: AC

## 2024-07-13 MED ORDER — ACCU-CHEK GUIDE TEST VI STRP: ORAL_STRIP | 12 refills | Status: AC

## 2024-07-13 NOTE — Telephone Encounter (Signed)
 Done

## 2024-07-21 ENCOUNTER — Other Ambulatory Visit: Payer: Self-pay

## 2024-07-21 DIAGNOSIS — E1165 Type 2 diabetes mellitus with hyperglycemia: Secondary | ICD-10-CM

## 2024-07-22 ENCOUNTER — Ambulatory Visit: Payer: Self-pay | Admitting: Family Medicine

## 2024-07-22 LAB — HEMOGLOBIN A1C
Est. average glucose Bld gHb Est-mCnc: 163 mg/dL
Hgb A1c MFr Bld: 7.3 % — ABNORMAL HIGH (ref 4.8–5.6)

## 2024-07-29 ENCOUNTER — Other Ambulatory Visit: Payer: Self-pay | Admitting: Urology

## 2024-07-29 DIAGNOSIS — N401 Enlarged prostate with lower urinary tract symptoms: Secondary | ICD-10-CM

## 2024-08-03 ENCOUNTER — Encounter: Payer: Self-pay | Admitting: Oncology

## 2024-08-04 DIAGNOSIS — Z01818 Encounter for other preprocedural examination: Secondary | ICD-10-CM | POA: Diagnosis not present

## 2024-08-12 ENCOUNTER — Other Ambulatory Visit

## 2024-08-12 DIAGNOSIS — M25512 Pain in left shoulder: Secondary | ICD-10-CM | POA: Diagnosis not present

## 2024-08-16 ENCOUNTER — Encounter: Payer: Self-pay | Admitting: Oncology

## 2024-08-16 DIAGNOSIS — M25512 Pain in left shoulder: Secondary | ICD-10-CM | POA: Diagnosis not present

## 2024-08-17 DIAGNOSIS — Z4889 Encounter for other specified surgical aftercare: Secondary | ICD-10-CM | POA: Diagnosis not present

## 2024-08-22 ENCOUNTER — Other Ambulatory Visit

## 2024-08-22 DIAGNOSIS — E291 Testicular hypofunction: Secondary | ICD-10-CM

## 2024-08-23 ENCOUNTER — Other Ambulatory Visit: Payer: Self-pay | Admitting: Family Medicine

## 2024-08-23 ENCOUNTER — Ambulatory Visit: Payer: Self-pay | Admitting: Urology

## 2024-08-23 LAB — TESTOSTERONE: Testosterone: 250 ng/dL — ABNORMAL LOW (ref 264–916)

## 2024-09-01 ENCOUNTER — Telehealth: Payer: Self-pay | Admitting: Family Medicine

## 2024-09-01 DIAGNOSIS — E1165 Type 2 diabetes mellitus with hyperglycemia: Secondary | ICD-10-CM

## 2024-09-01 MED ORDER — SITAGLIPTIN PHOSPHATE 100 MG PO TABS: 100.0000 mg | ORAL_TABLET | Freq: Every day | ORAL | 1 refills | Status: AC

## 2024-09-01 NOTE — Telephone Encounter (Signed)
 [facility] faxed refill request for the following medications:   JANUVIA  100 MG tablet   Advanced refill approval request  Please advise.

## 2024-09-13 ENCOUNTER — Other Ambulatory Visit: Payer: Self-pay | Admitting: Family Medicine

## 2024-09-13 DIAGNOSIS — E1165 Type 2 diabetes mellitus with hyperglycemia: Secondary | ICD-10-CM

## 2024-09-29 ENCOUNTER — Encounter: Payer: Self-pay | Admitting: Family Medicine

## 2024-09-29 ENCOUNTER — Other Ambulatory Visit: Payer: Self-pay | Admitting: Family Medicine

## 2024-09-29 ENCOUNTER — Ambulatory Visit (INDEPENDENT_AMBULATORY_CARE_PROVIDER_SITE_OTHER): Admitting: Family Medicine

## 2024-09-29 VITALS — BP 147/97 | HR 91 | Ht 74.0 in | Wt 233.9 lb

## 2024-09-29 DIAGNOSIS — G4730 Sleep apnea, unspecified: Secondary | ICD-10-CM

## 2024-09-29 DIAGNOSIS — E66811 Obesity, class 1: Secondary | ICD-10-CM | POA: Diagnosis not present

## 2024-09-29 DIAGNOSIS — Z7984 Long term (current) use of oral hypoglycemic drugs: Secondary | ICD-10-CM

## 2024-09-29 DIAGNOSIS — E785 Hyperlipidemia, unspecified: Secondary | ICD-10-CM

## 2024-09-29 DIAGNOSIS — Z6830 Body mass index (BMI) 30.0-30.9, adult: Secondary | ICD-10-CM

## 2024-09-29 DIAGNOSIS — Z23 Encounter for immunization: Secondary | ICD-10-CM | POA: Diagnosis not present

## 2024-09-29 DIAGNOSIS — E1159 Type 2 diabetes mellitus with other circulatory complications: Secondary | ICD-10-CM | POA: Diagnosis not present

## 2024-09-29 DIAGNOSIS — E1169 Type 2 diabetes mellitus with other specified complication: Secondary | ICD-10-CM

## 2024-09-29 DIAGNOSIS — I152 Hypertension secondary to endocrine disorders: Secondary | ICD-10-CM

## 2024-09-29 NOTE — Assessment & Plan Note (Signed)
 Hyperlipidemia managed with atorvastatin . He is not consistently taking atorvastatin . - Ensure atorvastatin  is taken daily - Provide updated medication list for him to verify and ensure adherence

## 2024-09-29 NOTE — Assessment & Plan Note (Signed)
 Discussed importance of healthy weight management Discussed diet and exercise

## 2024-09-29 NOTE — Assessment & Plan Note (Signed)
 Hypertension with recent elevated readings, likely due to missed doses of antihypertensive medications. Currently on lisinopril  and amlodipine , but not consistently taking amlodipine . - Ensure consistent daily intake of lisinopril  and amlodipine  - Provide updated medication list for him to verify and ensure adherence - Recheck blood pressure in one month - Recheck blood pressure before leaving the office

## 2024-09-29 NOTE — Assessment & Plan Note (Signed)
 Type 2 diabetes mellitus with fluctuating blood glucose levels, ranging from 96 to 150 mg/dL, with recent readings around 120 mg/dL. He is working on reducing carbohydrate intake and adjusting diet to manage blood sugar levels. - Continue Farxiga  and metformin  as prescribed - Ensure glipizide  is taken with breakfast - Continue Januvia  (sitagliptin ) - Recheck A1c in one month - Encourage dietary modifications focusing on reducing carbohydrate intake and choosing complex carbohydrates over simple ones

## 2024-09-29 NOTE — Progress Notes (Signed)
 Established patient visit   Patient: Jonathan Fields   DOB: [birth year]   68 y.o. Male  MRN: [id number] Visit Date: 09/29/2024  Today's healthcare provider: Jon Eva, MD   Chief Complaint  Patient presents with   Medical Management of Chronic Issues   Hyperlipidemia    Numbness/tingling in hands   Hypertension   Diabetes   Subjective    Hyperlipidemia  Hypertension  Diabetes   HPI     Hyperlipidemia    Additional comments: Numbness/tingling in hands      Last edited by Lilian Fitzpatrick, CMA on 09/29/2024  3:44 PM.       Discussed the use of AI scribe software for clinical note transcription with the patient, who gave verbal consent to proceed.  History of Present Illness   Jonathan Fields is a 68 year old male with hypertension and diabetes who presents for medication review and blood pressure management. He is accompanied by his wife.  His blood pressure has been fluctuating, and he did not take his lisinopril  today due to a busy schedule with physical therapy. He usually takes lisinopril  40 mg daily in the morning and has not been taking amlodipine . His diastolic blood pressure has been around 90 for several years, with a goal of maintaining it around 80.  He manages diabetes with metformin , Farxiga , and Januvia . Blood sugar levels range from 96 to 150, with recent readings around 120. He is managing carbohydrate intake by reducing portion sizes and choosing brown carbs over white carbs. He takes metformin  two pills twice a day and is consistent with his diabetes medications.  He is seven weeks post-surgery and attends physical therapy daily. Initially, he experienced significant fatigue and was unable to drive. He still experiences some pain after physical therapy sessions but is improving.  He is not currently taking Celebrex or meloxicam for pain management. He received a flu shot today and is up to date with pneumonia and shingles vaccines. He  inquires about the RSV vaccine, which he has not received yet.         Medications: Outpatient Medications Prior to Visit  Medication Sig   Accu-Chek Softclix Lancets lancets Use as instructed   alfuzosin  (UROXATRAL ) 10 MG 24 hr tablet TAKE 1 TABLET(10 MG) BY MOUTH IN THE MORNING AND AT BEDTIME   amLODipine  (NORVASC ) 5 MG tablet TAKE 1 TABLET(5 MG) BY MOUTH DAILY   atorvastatin  (LIPITOR) 40 MG tablet Take 1 tablet (40 mg total) by mouth daily.   Blood Glucose Monitoring Suppl (ACCU-CHEK GUIDE ME) w/Device KIT Use to monitor blood sugar three times daily   Cholecalciferol (VITAMIN D3) 50 MCG (2000 UT) capsule Take 2,000 Units by mouth daily.   FARXIGA  10 MG TABS tablet TAKE 1 TABLET(10 MG) BY MOUTH DAILY   fluticasone  (FLONASE ) 50 MCG/ACT nasal spray SHAKE LIQUID AND USE 2 SPRAYS IN EACH NOSTRIL DAILY   gabapentin  (NEURONTIN ) 300 MG capsule TAKE 1 CAPSULE(300 MG) BY MOUTH AT BEDTIME   glipiZIDE  (GLIPIZIDE  XL) 5 MG 24 hr tablet Take 1 tablet (5 mg total) by mouth daily with breakfast.   glucose blood (ACCU-CHEK GUIDE TEST) test strip Use to check blood sugar before meals three times daily   Lancets (EMBRACE PRESSURE ACTIVATED 28G) MISC Use to check blood sugar daily. Dx E11.65   Lancets (ONETOUCH DELICA PLUS LANCET33G) MISC Use as directed to check blood sugar daily. Dx E11.65   Lancets 28G Thin MISC 1 Lancet by Does not apply route  daily. Dx E11.65   lisinopril  (ZESTRIL ) 40 MG tablet Take 1 tablet (40 mg total) by mouth daily.   metFORMIN  (GLUCOPHAGE -XR) 500 MG 24 hr tablet TAKE 2 TABLETS(1000 MG) BY MOUTH TWICE DAILY WITH A MEAL   NEEDLE, DISP, 18 G (BD DISP NEEDLES) 18G X 1-1/2 MISC 1 mg by Does not apply route every 14 (fourteen) days.   NEEDLE, DISP, 21 G (BD SAFETYGLIDE SHIELDED NEEDLE) 21G X 1-1/2 MISC Use 21guage needle to inject Testosterone  Cypionate 1ml.   omeprazole (PRILOSEC) 20 MG capsule Take 20 mg by mouth daily.   Protein POWD Take 1 Scoop by mouth daily.   silver   sulfADIAZINE  (SILVADENE ) 1 % cream Apply 1 Application topically daily.   sitaGLIPtin  (JANUVIA ) 100 MG tablet Take 1 tablet (100 mg total) by mouth daily. TAKE 1 TABLET(100 MG) BY MOUTH DAILY   Syringe, Disposable, (2-3CC SYRINGE) 3 ML MISC 1 mg by Does not apply route every 14 (fourteen) days.   Syringe/Needle, Disp, (SYRINGE 3CC/23GX1) 23G X 1 3 ML MISC Use as directed   testosterone  cypionate (DEPOTESTOSTERONE CYPIONATE) 200 MG/ML injection INJECT 0.5 MLS IN THE MUSCLE EVERY WEEK. DISCARD REMAINDER OF VIAL   [DISCONTINUED] celecoxib (CELEBREX) 200 MG capsule Take 200 mg by mouth 2 (two) times daily.   [DISCONTINUED] meloxicam (MOBIC) 15 MG tablet Take 15 mg by mouth daily.   No facility-administered medications prior to visit.    Review of Systems     Objective    BP (!) 147/97 (BP Location: Left Arm, Patient Position: Sitting, Cuff Size: Large)   Pulse 91   Ht 6' 2 (1.88 m)   Wt 233 lb 14.4 oz (106.1 kg)   SpO2 99%   BMI 30.03 kg/m    Physical Exam Vitals reviewed.  Constitutional:      General: He is not in acute distress.    Appearance: Normal appearance. He is not diaphoretic.  HENT:     Head: Normocephalic and atraumatic.  Eyes:     General: No scleral icterus.    Conjunctiva/sclera: Conjunctivae normal.  Cardiovascular:     Rate and Rhythm: Normal rate and regular rhythm.     Heart sounds: Normal heart sounds. No murmur heard. Pulmonary:     Effort: Pulmonary effort is normal. No respiratory distress.     Breath sounds: Normal breath sounds. No wheezing or rhonchi.  Musculoskeletal:     Cervical back: Neck supple.     Right lower leg: No edema.     Left lower leg: No edema.  Lymphadenopathy:     Cervical: No cervical adenopathy.  Skin:    General: Skin is warm and dry.  Neurological:     Mental Status: He is alert and oriented to person, place, and time. Mental status is at baseline.  Psychiatric:        Mood and Affect: Mood normal.        Behavior:  Behavior normal.      No results found for any visits on 09/29/24.  Assessment & Plan     Problem List Items Addressed This Visit       Cardiovascular and Mediastinum   Hypertension associated with diabetes (HCC)   Hypertension with recent elevated readings, likely due to missed doses of antihypertensive medications. Currently on lisinopril  and amlodipine , but not consistently taking amlodipine . - Ensure consistent daily intake of lisinopril  and amlodipine  - Provide updated medication list for him to verify and ensure adherence - Recheck blood pressure in one month - Recheck  blood pressure before leaving the office        Respiratory   Sleep apnea   Recommend compliance with mouth piece        Endocrine   T2DM (type 2 diabetes mellitus) (HCC) - Primary   Type 2 diabetes mellitus with fluctuating blood glucose levels, ranging from 96 to 150 mg/dL, with recent readings around 120 mg/dL. He is working on reducing carbohydrate intake and adjusting diet to manage blood sugar levels. - Continue Farxiga  and metformin  as prescribed - Ensure glipizide  is taken with breakfast - Continue Januvia  (sitagliptin ) - Recheck A1c in one month - Encourage dietary modifications focusing on reducing carbohydrate intake and choosing complex carbohydrates over simple ones      Hyperlipidemia associated with type 2 diabetes mellitus (HCC)   Hyperlipidemia managed with atorvastatin . He is not consistently taking atorvastatin . - Ensure atorvastatin  is taken daily - Provide updated medication list for him to verify and ensure adherence        Other   Obesity   Discussed importance of healthy weight management Discussed diet and exercise       Other Visit Diagnoses       Immunization due       Relevant Orders   Flu vaccine HIGH DOSE PF(Fluzone Trivalent) (Completed)           Postoperative care following upper extremity surgery Postoperative care following upper extremity surgery  with ongoing physical therapy. Experiencing some pain post-therapy, but no need for regular analgesics. Monitoring for nerve conduction issues in the upper extremity with Ortho - Continue physical therapy - Use analgesics like Celebrex or meloxicam as needed for pain  General Health Maintenance General health maintenance is up to date with vaccinations. Discussed the need for RSV and COVID vaccines. - Obtain RSV vaccine at the pharmacy - Consider annual COVID booster vaccine - Schedule eye exam, preferably with a local provider for diabetic eye exam       Return in about 1 month (around 10/30/2024) for chronic disease f/u.       Jon Eva, MD  [facility] [phone] (phone) [phone] (fax)  [facility]

## 2024-09-29 NOTE — Assessment & Plan Note (Signed)
Recommend compliance with mouth piece

## 2024-09-30 NOTE — Telephone Encounter (Signed)
 Requested Prescriptions  Refused Prescriptions Disp Refills   glipiZIDE  (GLUCOTROL  XL) 5 MG 24 hr tablet [Pharmacy Med Name: GLIPIZIDE  ER 5MG  TABLETS] 90 tablet 1    Sig: TAKE 1 TABLET(5 MG) BY MOUTH DAILY WITH BREAKFAST     Endocrinology:  Diabetes - Sulfonylureas Passed - 09/30/2024  3:25 PM      Passed - HBA1C is between 0 and 7.9 and within 180 days    Hemoglobin A1C  Date Value Ref Range Status  11/26/2018 7.0  Final   Hgb A1c MFr Bld  Date Value Ref Range Status  07/21/2024 7.3 (H) 4.8 - 5.6 % Final    Comment:             Prediabetes: 5.7 - 6.4          Diabetes: >6.4          Glycemic control for adults with diabetes: <7.0    A1c  Date Value Ref Range Status  02/09/2019 7.3  Final         Passed - Cr in normal range and within 360 days    Creatinine, Ser  Date Value Ref Range Status  06/24/2024 0.89 0.76 - 1.27 mg/dL Final         Passed - Valid encounter within last 6 months    Recent Outpatient Visits           Yesterday Type 2 diabetes mellitus with other specified complication, without long-term current use of insulin  [facility])   [place] [facility] [place], Jon HERO, MD   3 months ago Type 2 diabetes mellitus with hyperglycemia, without long-term current use of insulin  [facility])   [place] [facility] [place], Jon HERO, MD   6 months ago Type 2 diabetes mellitus with hyperglycemia, without long-term current use of insulin  [facility])   [place] [facility] [place], Jon HERO, MD   6 months ago Type 2 diabetes mellitus with hyperglycemia, without long-term current use of insulin  [facility])   [place] [facility] [place], Jon HERO, MD   8 months ago Pre-op evaluation   [place] [facility] [place], Jon HERO, MD       Future Appointments             In 4 months Stoioff, Glendia BROCKS, MD [facility] [place]

## 2024-10-04 ENCOUNTER — Other Ambulatory Visit: Payer: Self-pay | Admitting: Family Medicine

## 2024-10-04 NOTE — Telephone Encounter (Unsigned)
 Copied from CRM [id number]. Topic: Clinical - Medication Refill >> Oct 04, 2024  9:01 AM Kendralyn S wrote: Medication:  amLODipine  (NORVASC ) 5 MG tablet   Has the patient contacted their pharmacy? Yes (Agent: If no, request that the patient contact the pharmacy for the refill. If patient does not wish to contact the pharmacy document the reason why and proceed with request.) (Agent: If yes, when and what did the pharmacy advise?)  This is the patient's preferred pharmacy:  [facility] #[id number] [place] [place], [place] - [address] AT [facility] OF [address] [place] [address] [place] [address] Phone: [phone] Fax: [phone]  Is this the correct pharmacy for this prescription? Yes If no, delete pharmacy and type the correct one.   Has the prescription been filled recently? No  Is the patient out of the medication? Yes  Has the patient been seen for an appointment in the last year OR does the patient have an upcoming appointment? Yes  Can we respond through MyChart? Yes  Agent: Please be advised that Rx refills may take up to 3 business days. We ask that you follow-up with your pharmacy.

## 2024-10-05 MED ORDER — AMLODIPINE BESYLATE 5 MG PO TABS: 5.0000 mg | ORAL_TABLET | Freq: Every day | ORAL | 0 refills | Status: DC

## 2024-10-05 NOTE — Telephone Encounter (Signed)
 Requested Prescriptions  Pending Prescriptions Disp Refills   amLODipine  (NORVASC ) 5 MG tablet 90 tablet 0    Sig: Take 1 tablet (5 mg total) by mouth daily.     Cardiovascular: Calcium  Channel Blockers 2 Failed - 10/05/2024 12:51 PM      Failed - Last BP in normal range    BP Readings from Last 1 Encounters:  09/29/24 (!) 147/97         Passed - Last Heart Rate in normal range    Pulse Readings from Last 1 Encounters:  09/29/24 91         Passed - Valid encounter within last 6 months    Recent Outpatient Visits           6 days ago Type 2 diabetes mellitus with other specified complication, without long-term current use of insulin  [facility])   [facility] [place], Jon HERO, MD   3 months ago Type 2 diabetes mellitus with hyperglycemia, without long-term current use of insulin  [facility])   [facility] [place], Jon HERO, MD   6 months ago Type 2 diabetes mellitus with hyperglycemia, without long-term current use of insulin  [facility])   [place] [facility] [place], Jon HERO, MD   6 months ago Type 2 diabetes mellitus with hyperglycemia, without long-term current use of insulin  [facility])   [place] [facility] [address] [place], Jon HERO, MD   8 months ago Pre-op evaluation   [place] [facility] [place], Jon HERO, MD       Future Appointments             In 4 months Stoioff, Glendia BROCKS, MD [facility] [place]

## 2024-10-21 ENCOUNTER — Other Ambulatory Visit: Payer: Self-pay | Admitting: Family Medicine

## 2024-10-21 DIAGNOSIS — E1159 Type 2 diabetes mellitus with other circulatory complications: Secondary | ICD-10-CM

## 2024-11-07 ENCOUNTER — Telehealth: Payer: Self-pay | Admitting: Family Medicine

## 2024-11-07 NOTE — Telephone Encounter (Unsigned)
 Copied from CRM [id number]. Topic: Clinical - Medication Refill >> Nov 07, 2024  3:47 PM Rachelle R wrote: Medication:  metFORMIN  (GLUCOPHAGE -XR) 500 MG 24 hr tablet  glipiZIDE  (GLIPIZIDE  XL) 5 MG 24 hr tablet  Has the patient contacted their pharmacy? Yes, call dr  This is the patient's preferred pharmacy:  [facility] #[id number] - [place], [place] - [address] AT [facility] OF [address] [place] [address] [place] [address] Phone: [phone] Fax: [phone]  Is this the correct pharmacy for this prescription? Yes If no, delete pharmacy and type the correct one.   Has the prescription been filled recently? No  Is the patient out of the medication? Yes  Has the patient been seen for an appointment in the last year OR does the patient have an upcoming appointment? Yes  Can we respond through MyChart? Yes  Agent: Please be advised that Rx refills may take up to 3 business days. We ask that you follow-up with your pharmacy.

## 2024-11-08 ENCOUNTER — Ambulatory Visit: Admitting: Podiatry

## 2024-11-08 DIAGNOSIS — M216X9 Other acquired deformities of unspecified foot: Secondary | ICD-10-CM

## 2024-11-08 NOTE — Progress Notes (Signed)
 Chief Complaint  Patient presents with   Diabetes    DFC. He has callouses bilateral    Subjective:  68 y.o. male with PMHx of diabetes mellitus presenting today for follow-up evaluation of a recurrent calluses to the lateral aspect of the bilateral feet secondary to cavus foot deformity   Past Medical History:  Diagnosis Date   Allergy    Anxiety    Arthritis    BPH (benign prostatic hyperplasia)    Bronchitis    Complication of anesthesia    hard to wake up   DM (diabetes mellitus), type 2 (HCC)    DVT (deep venous thrombosis) (HCC)    post op   ED (erectile dysfunction)    Erythrocytosis 07/04/2020   GERD (gastroesophageal reflux disease)    Hyperlipemia    Hypertension    Hypogonadism in male    Nocturia    Right ankle instability    Sleep apnea    TB lung, latent    Urinary frequency     Past Surgical History:  Procedure Laterality Date   ANKLE ARTHROSCOPY Right 11/13/2021   Procedure: ANKLE ARTHROSCOPY Arthroscopic Irrigation;  Surgeon: Silva Juliene SAUNDERS, DPM;  Location: [facility] ORS;  Service: Podiatry;  Laterality: Right;   ANKLE SURGERY Left    torn ligament   CARPAL TUNNEL RELEASE Right 08/2019   COLONOSCOPY  2009   COLONOSCOPY WITH PROPOFOL  N/A 01/23/2022   Procedure: COLONOSCOPY WITH PROPOFOL ;  Surgeon: Jinny Carmine, MD;  Location: [facility] ENDOSCOPY;  Service: Endoscopy;  Laterality: N/A;   HERNIA REPAIR     IRRIGATION AND DEBRIDEMENT ABSCESS Right 11/13/2021   Procedure: IRRIGATION AND DEBRIDEMENT ABSCESS;  Surgeon: Silva Juliene SAUNDERS, DPM;  Location: [facility] ORS;  Service: Podiatry;  Laterality: Right;   IRRIGATION AND DEBRIDEMENT FOOT Right 11/16/2021   Procedure: IRRIGATION AND DEBRIDEMENT ANKLE;  Surgeon: Janit Thresa HERO, DPM;  Location: [facility] ORS;  Service: Podiatry;  Laterality: Right;   KNEE ARTHROSCOPY Left    right ankle ruptured tendon surgery  2008   TRANSURETHRAL RESECTION OF PROSTATE N/A 02/19/2018   Procedure: TRANSURETHRAL RESECTION OF THE  PROSTATE (TURP);  Surgeon: Chauncey Redell Agent, MD;  Location: [facility] ORS;  Service: Urology;  Laterality: N/A;    Allergies  Allergen Reactions   Penicillins Hives    Has patient had a PCN reaction causing immediate rash, facial/tongue/throat swelling, SOB or lightheadedness with hypotension: yes Has patient had a PCN reaction causing severe rash involving mucus membranes or skin necrosis: no Has patient had a PCN reaction that required hospitalization: no Has patient had a PCN reaction occurring within the last 10 years: no If all of the above answers are NO, then may proceed with Cephalosporin use.      Objective/Physical Exam General: The patient is alert and oriented x3 in no acute distress.  Dermatology:  Significant pigment.  No open wounds noted.  Calluses noted to the lateral aspect of the fifth metatarsal tubercle and plantar aspect of the fifth MTP left foot  Vascular: Palpable pedal pulses bilaterally. No edema or erythema noted. Capillary refill within normal limits.  Neurological: Grossly intact via light touch  Musculoskeletal Exam: Rigid cavovarus deformity bilateral feet with lateral column loading with weightbearing.  This deformity is what is contributing to the pinching of the skin creating the callus and fissure  Assessment: 1.  Calluses bilateral feet 2. diabetes mellitus w/ peripheral neuropathy 3. Cavovarus deformity bilateral feet  4.  PSxHx lateral ankle stabilization procedure 09/05/2021 with secondary infection  Plan of Care:  -Patient was evaluated. - Light debridement of the callus was performed today using a 312 scalpel and tissue nipper.  Patient tolerated this well. -Continue lotion or Silvadene  cream as needed -Continue hightop sneakers that provide stability to the ankles -Return to clinic as needed    Thresa EMERSON Sar, DPM [facility]  Dr. Thresa EMERSON Sar, DPM    [address] [place], [place] [address]                Office [phone]  Fax [phone]

## 2024-11-09 MED ORDER — METFORMIN HCL ER 500 MG PO TB24: ORAL_TABLET | ORAL | 0 refills | Status: DC

## 2024-11-09 NOTE — Telephone Encounter (Signed)
 Requested Prescriptions  Pending Prescriptions Disp Refills   metFORMIN  (GLUCOPHAGE -XR) 500 MG 24 hr tablet 360 tablet 0    Sig: TAKE 2 TABLETS(1000 MG) BY MOUTH TWICE DAILY WITH A MEAL     Endocrinology:  Diabetes - Biguanides Failed - 11/09/2024  3:19 PM      Failed - CBC within normal limits and completed in the last 12 months    WBC  Date Value Ref Range Status  01/26/2023 6.0 4.0 - 10.5 K/uL Final   WBC Count  Date Value Ref Range Status  05/18/2024 6.8 4.0 - 10.5 K/uL Final   RBC  Date Value Ref Range Status  05/18/2024 6.08 (H) 4.22 - 5.81 MIL/uL Final   Hemoglobin  Date Value Ref Range Status  05/18/2024 16.9 13.0 - 17.0 g/dL Final  95/69/7975 83.9 13.0 - 17.7 g/dL Final   HCT  Date Value Ref Range Status  05/18/2024 50.5 39.0 - 52.0 % Final   Hematocrit  Date Value Ref Range Status  03/31/2023 48.9 37.5 - 51.0 % Final   MCHC  Date Value Ref Range Status  05/18/2024 33.5 30.0 - 36.0 g/dL Final   [facility]  Date Value Ref Range Status  05/18/2024 27.8 26.0 - 34.0 pg Final   MCV  Date Value Ref Range Status  05/18/2024 83.1 80.0 - 100.0 fL Final  03/31/2023 79 79 - 97 fL Final   No results found for: PLTCOUNTKUC, LABPLAT, POCPLA RDW  Date Value Ref Range Status  05/18/2024 13.7 11.5 - 15.5 % Final  03/31/2023 15.3 11.6 - 15.4 % Final         Passed - Cr in normal range and within 360 days    Creatinine, Ser  Date Value Ref Range Status  06/24/2024 0.89 0.76 - 1.27 mg/dL Final         Passed - HBA1C is between 0 and 7.9 and within 180 days    Hemoglobin A1C  Date Value Ref Range Status  11/26/2018 7.0  Final   Hgb A1c MFr Bld  Date Value Ref Range Status  07/21/2024 7.3 (H) 4.8 - 5.6 % Final    Comment:             Prediabetes: 5.7 - 6.4          Diabetes: >6.4          Glycemic control for adults with diabetes: <7.0    A1c  Date Value Ref Range Status  02/09/2019 7.3  Final         Passed - eGFR in normal range and within 360 days     GFR calc Af Amer  Date Value Ref Range Status  09/28/2020 88 >59 mL/min/1.73 Final    Comment:    **In accordance with recommendations from the NKF-ASN Task force,**   [facility] is in the process of updating its eGFR calculation to the   2021 CKD-EPI creatinine equation that estimates kidney function   without a race variable.    GFR, Estimated  Date Value Ref Range Status  11/18/2021 >60 >60 mL/min Final    Comment:    (NOTE) Calculated using the CKD-EPI Creatinine Equation (2021)    eGFR  Date Value Ref Range Status  06/24/2024 93 >59 mL/min/1.73 Final         Passed - B12 Level in normal range and within 720 days    Vitamin B-12  Date Value Ref Range Status  10/06/2023 468 232 - 1,245 pg/mL Final  Passed - Valid encounter within last 6 months    Recent Outpatient Visits           1 month ago Type 2 diabetes mellitus with other specified complication, without long-term current use of insulin  (HCC)   [place] [facility] [place], Jon HERO, MD   4 months ago Type 2 diabetes mellitus with hyperglycemia, without long-term current use of insulin  [facility])   [place] [facility] [place], Jon HERO, MD   7 months ago Type 2 diabetes mellitus with hyperglycemia, without long-term current use of insulin  [facility])   [facility] [place], Jon HERO, MD   7 months ago Type 2 diabetes mellitus with hyperglycemia, without long-term current use of insulin  [facility])   [place] [facility] [place], Jon HERO, MD   9 months ago Pre-op evaluation   [place] [facility] [place], Jon HERO, MD       Future Appointments             In 3 months Stoioff, Glendia BROCKS, MD [facility] [place]            Refused Prescriptions Disp Refills   glipiZIDE  (GLIPIZIDE  XL) 5 MG 24 hr tablet 90 tablet 1    Sig: Take 1 tablet (5 mg total) by mouth daily with breakfast.      Endocrinology:  Diabetes - Sulfonylureas Passed - 11/09/2024  3:19 PM      Passed - HBA1C is between 0 and 7.9 and within 180 days    Hemoglobin A1C  Date Value Ref Range Status  11/26/2018 7.0  Final   Hgb A1c MFr Bld  Date Value Ref Range Status  07/21/2024 7.3 (H) 4.8 - 5.6 % Final    Comment:             Prediabetes: 5.7 - 6.4          Diabetes: >6.4          Glycemic control for adults with diabetes: <7.0    A1c  Date Value Ref Range Status  02/09/2019 7.3  Final         Passed - Cr in normal range and within 360 days    Creatinine, Ser  Date Value Ref Range Status  06/24/2024 0.89 0.76 - 1.27 mg/dL Final         Passed - Valid encounter within last 6 months    Recent Outpatient Visits           1 month ago Type 2 diabetes mellitus with other specified complication, without long-term current use of insulin  [facility])   [place] [facility] [place], Jon HERO, MD   4 months ago Type 2 diabetes mellitus with hyperglycemia, without long-term current use of insulin  [facility])   [place] [facility] [place], Jon HERO, MD   7 months ago Type 2 diabetes mellitus with hyperglycemia, without long-term current use of insulin  [facility])   [place] [facility] [place], Jon HERO, MD   7 months ago Type 2 diabetes mellitus with hyperglycemia, without long-term current use of insulin  [facility])   [place] [facility] [place], Jon HERO, MD   9 months ago Pre-op evaluation   [place] [facility] [place], Jon HERO, MD       Future Appointments             In 3 months Stoioff, Glendia BROCKS, MD [facility] [place]

## 2024-11-14 ENCOUNTER — Ambulatory Visit: Admitting: Family Medicine

## 2024-11-14 ENCOUNTER — Encounter: Payer: Self-pay | Admitting: Family Medicine

## 2024-11-14 VITALS — BP 113/82 | HR 101 | Ht 74.0 in | Wt 235.9 lb

## 2024-11-14 DIAGNOSIS — I152 Hypertension secondary to endocrine disorders: Secondary | ICD-10-CM | POA: Diagnosis not present

## 2024-11-14 DIAGNOSIS — Z7984 Long term (current) use of oral hypoglycemic drugs: Secondary | ICD-10-CM | POA: Diagnosis not present

## 2024-11-14 DIAGNOSIS — E1165 Type 2 diabetes mellitus with hyperglycemia: Secondary | ICD-10-CM | POA: Diagnosis not present

## 2024-11-14 DIAGNOSIS — E1159 Type 2 diabetes mellitus with other circulatory complications: Secondary | ICD-10-CM | POA: Diagnosis not present

## 2024-11-14 DIAGNOSIS — E1169 Type 2 diabetes mellitus with other specified complication: Secondary | ICD-10-CM

## 2024-11-14 DIAGNOSIS — E785 Hyperlipidemia, unspecified: Secondary | ICD-10-CM | POA: Diagnosis not present

## 2024-11-14 LAB — POCT GLYCOSYLATED HEMOGLOBIN (HGB A1C): Hemoglobin A1C: 7.1 % — AB (ref 4.0–5.6)

## 2024-11-14 MED ORDER — ATORVASTATIN CALCIUM 40 MG PO TABS: 40.0000 mg | ORAL_TABLET | Freq: Every day | ORAL | 3 refills | Status: AC

## 2024-11-14 MED ORDER — AMLODIPINE BESYLATE 5 MG PO TABS: 5.0000 mg | ORAL_TABLET | Freq: Every day | ORAL | 3 refills | Status: AC

## 2024-11-14 MED ORDER — METFORMIN HCL ER 500 MG PO TB24: ORAL_TABLET | ORAL | 3 refills | Status: AC

## 2024-11-14 MED ORDER — DAPAGLIFLOZIN PROPANEDIOL 10 MG PO TABS: 10.0000 mg | ORAL_TABLET | Freq: Every day | ORAL | 3 refills | Status: AC

## 2024-11-14 NOTE — Assessment & Plan Note (Signed)
 Type 2 diabetes mellitus with recent hyperglycemia. A1c improved to 7.1, the lowest in a long time. Recent blood glucose levels fluctuated, with a recent increase to 160 mg/dL. No recent illness reported. Dietary modifications have been effective in improving A1c. Target A1c is under 7, with a goal of 6.9. - Continue current diabetes medications: Farxiga , metformin , glipizide  XL, Januvia . - Encouraged dietary modifications to reduce carbohydrate intake, particularly from pasta, fries, and chips. - Scheduled follow-up in 3 months to reassess A1c and overall diabetes management.

## 2024-11-14 NOTE — Progress Notes (Signed)
 Established patient visit   Patient: Jonathan Fields   DOB: September 30, 1956   68 y.o. Male  MRN: [id number] Visit Date: 11/14/2024  Today's healthcare provider: Jon Eva, MD   Chief Complaint  Patient presents with   Medical Management of Chronic Issues   Diabetes    Avg 130 mg/dL fasting    Subjective    Diabetes   HPI     Diabetes    Additional comments: Avg 130 mg/dL fasting       Last edited by Lilian Fitzpatrick, CMA on 11/14/2024  2:01 PM.       Discussed the use of AI scribe software for clinical note transcription with the patient, who gave verbal consent to proceed.  History of Present Illness   Jonathan Fields is a 68 year old male with type 2 diabetes, hypertension, and hyperlipidemia who presents for chronic disease follow-up.  He has home blood glucose readings around 160 mg/dL despite a consistent diet. His A1c has improved to 7.1%, his best in years. He checks his blood glucose daily and has reduced portions of high-carbohydrate foods. He has had no recent illness affecting his blood sugars.  He takes amlodipine  5 mg daily, atorvastatin  40 mg daily, Farxiga  10 mg daily, gabapentin  300 mg QHS, glipizide  XL 5 mg daily, lisinopril  40 mg daily, metformin  XR 1000 mg twice daily, and Januvia  100 mg daily. He needs refills for amlodipine  and Farxiga .  He recently received a cortisone injection for numbness in three fingers consistent with carpal tunnel type symptoms after prior shoulder replacement surgery. He reports the injection was uncomfortable but effective enough to proceed with.       Medications: Show/hide medication list[1]  Review of Systems     Objective    BP 113/82 (BP Location: Left Arm, Patient Position: Sitting)   Pulse (!) 101   Ht 6' 2 (1.88 m)   Wt 235 lb 14.4 oz (107 kg)   SpO2 100%   BMI 30.29 kg/m    Physical Exam Vitals reviewed.  Constitutional:      General: He is not in acute distress.    Appearance:  Normal appearance. He is not diaphoretic.  HENT:     Head: Normocephalic and atraumatic.  Eyes:     General: No scleral icterus.    Conjunctiva/sclera: Conjunctivae normal.  Cardiovascular:     Rate and Rhythm: Normal rate and regular rhythm.     Heart sounds: Normal heart sounds. No murmur heard. Pulmonary:     Effort: Pulmonary effort is normal. No respiratory distress.     Breath sounds: Normal breath sounds. No wheezing or rhonchi.  Musculoskeletal:     Cervical back: Neck supple.     Right lower leg: No edema.     Left lower leg: No edema.  Lymphadenopathy:     Cervical: No cervical adenopathy.  Skin:    General: Skin is warm and dry.  Neurological:     Mental Status: He is alert. Mental status is at baseline.  Psychiatric:        Mood and Affect: Mood normal.        Behavior: Behavior normal.      Results for orders placed or performed in visit on 11/14/24  POCT glycosylated hemoglobin (Hb A1C)  Result Value Ref Range   Hemoglobin A1C 7.1 (A) 4.0 - 5.6 %   HbA1c POC (<> result, manual entry)     HbA1c, POC (prediabetic range)  HbA1c, POC (controlled diabetic range)      Assessment & Plan     Problem List Items Addressed This Visit       Cardiovascular and Mediastinum   Hypertension associated with diabetes (HCC)   Well controlled with current medications. - Continue current antihypertensive medications: amlodipine , lisinopril .      Relevant Medications   amLODipine  (NORVASC ) 5 MG tablet   atorvastatin  (LIPITOR) 40 MG tablet   dapagliflozin  propanediol (FARXIGA ) 10 MG TABS tablet   metFORMIN  (GLUCOPHAGE -XR) 500 MG 24 hr tablet     Endocrine   T2DM (type 2 diabetes mellitus) (HCC) - Primary   Type 2 diabetes mellitus with recent hyperglycemia. A1c improved to 7.1, the lowest in a long time. Recent blood glucose levels fluctuated, with a recent increase to 160 mg/dL. No recent illness reported. Dietary modifications have been effective in improving A1c.  Target A1c is under 7, with a goal of 6.9. - Continue current diabetes medications: Farxiga , metformin , glipizide  XL, Januvia . - Encouraged dietary modifications to reduce carbohydrate intake, particularly from pasta, fries, and chips. - Scheduled follow-up in 3 months to reassess A1c and overall diabetes management.      Relevant Medications   atorvastatin  (LIPITOR) 40 MG tablet   dapagliflozin  propanediol (FARXIGA ) 10 MG TABS tablet   metFORMIN  (GLUCOPHAGE -XR) 500 MG 24 hr tablet   Other Relevant Orders   POCT glycosylated hemoglobin (Hb A1C) (Completed)   Hyperlipidemia associated with type 2 diabetes mellitus (HCC)   Managed with atorvastatin . - Continue atorvastatin  40 mg daily. - Sent refill for atorvastatin  to pharmacy.      Relevant Medications   amLODipine  (NORVASC ) 5 MG tablet   atorvastatin  (LIPITOR) 40 MG tablet   dapagliflozin  propanediol (FARXIGA ) 10 MG TABS tablet   metFORMIN  (GLUCOPHAGE -XR) 500 MG 24 hr tablet   Hyperglycemia due to type 2 diabetes mellitus (HCC)   Improving, A1c not quite to goal of <7      Relevant Medications   atorvastatin  (LIPITOR) 40 MG tablet   dapagliflozin  propanediol (FARXIGA ) 10 MG TABS tablet   metFORMIN  (GLUCOPHAGE -XR) 500 MG 24 hr tablet        General Health Maintenance Routine health maintenance discussed, including the importance of regular eye exams for diabetic retinopathy screening. - Schedule diabetic eye exam at [facility]. - Ensure eye exam includes diabetic retinopathy screening.       Return in about 3 months (around 02/12/2025) for CPE.       Jon Eva, MD  [facility] [phone] (phone) [phone] (fax)  [facility]     [1]  Outpatient Medications Prior to Visit  Medication Sig   Accu-Chek Softclix Lancets lancets Use as instructed   alfuzosin  (UROXATRAL ) 10 MG 24 hr tablet TAKE 1 TABLET(10 MG) BY MOUTH IN THE MORNING AND AT BEDTIME   Blood  Glucose Monitoring Suppl (ACCU-CHEK GUIDE ME) w/Device KIT Use to monitor blood sugar three times daily   Cholecalciferol (VITAMIN D3) 50 MCG (2000 UT) capsule Take 2,000 Units by mouth daily.   fluticasone  (FLONASE ) 50 MCG/ACT nasal spray SHAKE LIQUID AND USE 2 SPRAYS IN EACH NOSTRIL DAILY   gabapentin  (NEURONTIN ) 300 MG capsule TAKE 1 CAPSULE(300 MG) BY MOUTH AT BEDTIME   glipiZIDE  (GLIPIZIDE  XL) 5 MG 24 hr tablet Take 1 tablet (5 mg total) by mouth daily with breakfast.   glucose blood (ACCU-CHEK GUIDE TEST) test strip Use to check blood sugar before meals three times daily   Lancets (EMBRACE PRESSURE  ACTIVATED 28G) MISC Use to check blood sugar daily. Dx E11.65   Lancets (ONETOUCH DELICA PLUS LANCET33G) MISC Use as directed to check blood sugar daily. Dx E11.65   Lancets 28G Thin MISC 1 Lancet by Does not apply route daily. Dx E11.65   lisinopril  (ZESTRIL ) 40 MG tablet TAKE 1 TABLET(40 MG) BY MOUTH DAILY   NEEDLE, DISP, 18 G (BD DISP NEEDLES) 18G X 1-1/2 MISC 1 mg by Does not apply route every 14 (fourteen) days.   NEEDLE, DISP, 21 G (BD SAFETYGLIDE SHIELDED NEEDLE) 21G X 1-1/2 MISC Use 21guage needle to inject Testosterone  Cypionate 1ml.   omeprazole (PRILOSEC) 20 MG capsule Take 20 mg by mouth daily.   Protein POWD Take 1 Scoop by mouth daily.   silver  sulfADIAZINE  (SILVADENE ) 1 % cream Apply 1 Application topically daily.   sitaGLIPtin  (JANUVIA ) 100 MG tablet Take 1 tablet (100 mg total) by mouth daily. TAKE 1 TABLET(100 MG) BY MOUTH DAILY   Syringe, Disposable, (2-3CC SYRINGE) 3 ML MISC 1 mg by Does not apply route every 14 (fourteen) days.   Syringe/Needle, Disp, (SYRINGE 3CC/23GX1) 23G X 1 3 ML MISC Use as directed   testosterone  cypionate (DEPOTESTOSTERONE CYPIONATE) 200 MG/ML injection INJECT 0.5 MLS IN THE MUSCLE EVERY WEEK. DISCARD REMAINDER OF VIAL   [DISCONTINUED] amLODipine  (NORVASC ) 5 MG tablet Take 1 tablet (5 mg total) by mouth daily.   [DISCONTINUED] atorvastatin   (LIPITOR) 40 MG tablet Take 1 tablet (40 mg total) by mouth daily.   [DISCONTINUED] FARXIGA  10 MG TABS tablet TAKE 1 TABLET(10 MG) BY MOUTH DAILY   [DISCONTINUED] metFORMIN  (GLUCOPHAGE -XR) 500 MG 24 hr tablet TAKE 2 TABLETS(1000 MG) BY MOUTH TWICE DAILY WITH A MEAL   No facility-administered medications prior to visit.

## 2024-11-14 NOTE — Assessment & Plan Note (Signed)
 Managed with atorvastatin . - Continue atorvastatin  40 mg daily. - Sent refill for atorvastatin  to pharmacy.

## 2024-11-14 NOTE — Assessment & Plan Note (Signed)
 Improving, A1c not quite to goal of <7

## 2024-11-14 NOTE — Assessment & Plan Note (Signed)
 Well controlled with current medications. - Continue current antihypertensive medications: amlodipine , lisinopril .

## 2024-11-17 ENCOUNTER — Ambulatory Visit

## 2024-11-17 ENCOUNTER — Ambulatory Visit: Admitting: Oncology

## 2024-11-17 ENCOUNTER — Inpatient Hospital Stay: Attending: Oncology

## 2024-11-17 ENCOUNTER — Encounter: Payer: Self-pay | Admitting: Oncology

## 2024-11-17 VITALS — BP 117/79 | HR 99 | Temp 98.6°F | Resp 19 | Wt 235.5 lb

## 2024-11-17 DIAGNOSIS — D751 Secondary polycythemia: Secondary | ICD-10-CM | POA: Diagnosis not present

## 2024-11-17 DIAGNOSIS — Z79890 Hormone replacement therapy (postmenopausal): Secondary | ICD-10-CM | POA: Diagnosis not present

## 2024-11-17 LAB — CBC (CANCER CENTER ONLY)
HCT: 47.7 % (ref 39.0–52.0)
Hemoglobin: 16.1 g/dL (ref 13.0–17.0)
MCH: 28.2 pg (ref 26.0–34.0)
MCHC: 33.8 g/dL (ref 30.0–36.0)
MCV: 83.5 fL (ref 80.0–100.0)
Platelet Count: 203 K/uL (ref 150–400)
RBC: 5.71 MIL/uL (ref 4.22–5.81)
RDW: 13.4 % (ref 11.5–15.5)
WBC Count: 6 K/uL (ref 4.0–10.5)
nRBC: 0 % (ref 0.0–0.2)

## 2024-11-17 MED ORDER — SODIUM CHLORIDE 0.9 % IV SOLN
Freq: Once | INTRAVENOUS | Status: DC
  Filled 2024-11-17: qty 250

## 2024-11-17 NOTE — Assessment & Plan Note (Signed)
 Follow-up with urology for testosterone replacement management.

## 2024-11-17 NOTE — Progress Notes (Signed)
No phlebotomy today.

## 2024-11-17 NOTE — Assessment & Plan Note (Addendum)
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 Labs are reviewed and discussed with patient. Hct <50 No need for phlebotomy.

## 2024-11-17 NOTE — Progress Notes (Signed)
 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 Labs are reviewed and discussed with patient. Hct <50 No need for phlebotomy.   Long-term current use of testosterone  replacement therapy Follow-up with urology for testosterone  replacement management.    Orders Placed This Encounter  Procedures   CBC (Cancer Center Only)    Standing Status:   Future    Expected Date:   05/18/2025    Expiration Date:   08/16/2025   Follow up  6 months lab MD +/- Phleb cbc   All questions were answered. The patient knows to call the clinic with any problems, questions or concerns.  Zelphia Cap, MD, PhD [facility] Hematology Oncology 11/17/2024   HISTORY OF PRESENTING ILLNESS:  Jonathan Fields is a 68 y.o. male who was seen in consultation at the request of Myrla, Jon HERO, MD for evaluation of polycytosis/erythrocytosis Reviewed patient's recent lab work 03/14/2020, hematocrit 55, 09/13/2019 hematocrit 51.8, 07/22/2019, hematocrit 49.1.  Hemoglobin 17.2.   Patient is on testosterone  replacement.  Currently Dr. Twylla hold off treatments due to erythrocytosis. Patient was is Dr. Rosine to hematology oncology for evaluation and management. Associated signs or symptoms: Patient mention about 15 pounds of unintentional weight loss recently during the past few weeks.  His appetite is unchanged.  He feels that he has eaten similar amount of food recently.  Denies any increase of activity level.  Some sweating for the past 2 days.  Context:  Smoking history: Denies. Testosterone  supplements: History of blood clots: Previously he has an episode of provoked thrombosis after surgery. Daytime somnolence: Denies Family history of polycythemia: Denies  Patient underwent  stabilization of right foot complicated by wound dehiscence, septic arthritis of the right ankle, acute osteomyelitis.  Patient was taken to the OR for washout and debridement in December 2022.  Finished antibiotic course.  03/17/2022 for Sclerotherapy for symptomatic varicose veins of the right lower extremity.  INTERVAL HISTORY Jonathan Fields is a 68 y.o. male who has above history reviewed by me today presents for follow up visit for management of secondary erythrocytosis due to testosterone  use Patient is on testosterone  replacement therapy Patient has no new complaints. Review of Systems  Constitutional:  Negative for appetite change, chills, fatigue, fever and unexpected weight change.  HENT:   Negative for hearing loss and voice change.   Eyes:  Negative for eye problems and icterus.  Respiratory:  Negative for chest tightness, cough and shortness of breath.   Cardiovascular:  Negative for chest pain and leg swelling.  Gastrointestinal:  Negative for abdominal distention and abdominal pain.  Endocrine: Negative for hot flashes.  Genitourinary:  Negative for difficulty urinating, dysuria and frequency.   Musculoskeletal:  Negative for arthralgias.  Skin:  Negative for itching and rash.  Neurological:  Negative for light-headedness and numbness.  Hematological:  Negative for adenopathy. Does not bruise/bleed easily.  Psychiatric/Behavioral:  Negative for confusion.     MEDICAL HISTORY:  Past Medical History:  Diagnosis Date   Allergy    Anxiety    Arthritis    BPH (benign prostatic hyperplasia)    Bronchitis    Complication of anesthesia    hard to wake up   DM (diabetes mellitus), type 2 (HCC)    DVT (deep venous thrombosis) (HCC)    post op   ED (erectile dysfunction)  Erythrocytosis 07/04/2020   GERD (gastroesophageal reflux disease)    Hyperlipemia    Hypertension    Hypogonadism in male    Nocturia    Right ankle instability    Sleep apnea    TB lung, latent     Urinary frequency     SURGICAL HISTORY: Past Surgical History:  Procedure Laterality Date   ANKLE ARTHROSCOPY Right 11/13/2021   Procedure: ANKLE ARTHROSCOPY Arthroscopic Irrigation;  Surgeon: Silva Juliene SAUNDERS, DPM;  Location: [facility];  Service: Podiatry;  Laterality: Right;   ANKLE SURGERY Left    torn ligament   CARPAL TUNNEL RELEASE Right 08/2019   COLONOSCOPY  2009   COLONOSCOPY WITH PROPOFOL  N/A 01/23/2022   Procedure: COLONOSCOPY WITH PROPOFOL ;  Surgeon: Jinny Carmine, MD;  Location: [facility];  Service: Endoscopy;  Laterality: N/A;   HERNIA REPAIR     IRRIGATION AND DEBRIDEMENT ABSCESS Right 11/13/2021   Procedure: IRRIGATION AND DEBRIDEMENT ABSCESS;  Surgeon: Silva Juliene SAUNDERS, DPM;  Location: [facility];  Service: Podiatry;  Laterality: Right;   IRRIGATION AND DEBRIDEMENT FOOT Right 11/16/2021   Procedure: IRRIGATION AND DEBRIDEMENT ANKLE;  Surgeon: Janit Thresa HERO, DPM;  Location: [facility];  Service: Podiatry;  Laterality: Right;   KNEE ARTHROSCOPY Left    right ankle ruptured tendon surgery  2008   TRANSURETHRAL RESECTION OF PROSTATE N/A 02/19/2018   Procedure: TRANSURETHRAL RESECTION OF THE PROSTATE (TURP);  Surgeon: Chauncey Redell Agent, MD;  Location: [facility];  Service: Urology;  Laterality: N/A;    SOCIAL HISTORY: Social History   Socioeconomic History   Marital status: Married    Spouse name: Not on file   Number of children: 0   Years of education: Not on file   Highest education level: Associate degree: occupational, scientist, product/process development, or vocational program  Occupational History   Not on file  Tobacco Use   Smoking status: Never   Smokeless tobacco: Current    Types: Snuff   Tobacco comments:    tobaco pouches that produce juice, dip occasionally  Vaping Use   Vaping status: Never Used  Substance and Sexual Activity   Alcohol use: Not Currently    Comment: COUPLE OF DRINKS EVERY WEEK   Drug use: No   Sexual activity: Yes    Partners: Female    Birth  control/protection: None  Other Topics Concern   Not on file  Social History Narrative   Not on file   Social Drivers of Health   Tobacco Use: High Risk (11/17/2024)   Patient History    Smoking Tobacco Use: Never    Smokeless Tobacco Use: Current    Passive Exposure: Not on file  Financial Resource Strain: Low Risk (01/07/2024)   Overall Financial Resource Strain (CARDIA)    Difficulty of Paying Living Expenses: Not hard at all  Food Insecurity: No Food Insecurity (01/07/2024)   Hunger Vital Sign    Worried About Running Out of Food in the Last Year: Never true    Ran Out of Food in the Last Year: Never true  Transportation Needs: No Transportation Needs (01/07/2024)   PRAPARE - Administrator, Civil Service (Medical): No    Lack of Transportation (Non-Medical): No  Physical Activity: Sufficiently Active (01/07/2024)   Exercise Vital Sign    Days of Exercise per Week: 5 days    Minutes of Exercise per Session: 60 min  Stress: Stress Concern Present (01/07/2024)   [employer] of Occupational Health - Occupational Stress Questionnaire  Feeling of Stress : To some extent  Social Connections: Moderately Integrated (01/07/2024)   Social Connection and Isolation Panel    Frequency of Communication with Friends and Family: Three times a week    Frequency of Social Gatherings with Friends and Family: Once a week    Attends Religious Services: More than 4 times per year    Active Member of [employer] or Organizations: No    Attends Engineer, Structural: Not on file    Marital Status: Married  Catering Manager Violence: Not At Risk (01/07/2024)   Humiliation, Afraid, Rape, and Kick questionnaire    Fear of Current or Ex-Partner: No    Emotionally Abused: No    Physically Abused: No    Sexually Abused: No  Depression (PHQ2-9): Medium Risk (06/23/2024)   Depression (PHQ2-9)    PHQ-2 Score: 6  Alcohol Screen: Low Risk (01/07/2024)   Alcohol Screen    Last Alcohol Screening  Score (AUDIT): 2  Housing: Low Risk (01/07/2024)   Housing Stability Vital Sign    Unable to Pay for Housing in the Last Year: No    Number of Times Moved in the Last Year: 0    Homeless in the Last Year: No  Utilities: Not At Risk (01/07/2024)   [employer] Utilities    Threatened with loss of utilities: No  Health Literacy: Adequate Health Literacy (01/07/2024)   B1300 Health Literacy    Frequency of need for help with medical instructions: Never    FAMILY HISTORY: Family History  Problem Relation Age of Onset   Heart disease Father    Hypertension Father    Prostate cancer Father    Heart disease Mother    Breast cancer Mother    Colon cancer Neg Hx     ALLERGIES:  is allergic to penicillins.  MEDICATIONS:  Current Outpatient Medications  Medication Sig Dispense Refill   Accu-Chek Softclix Lancets lancets Use as instructed 100 each 12   alfuzosin  (UROXATRAL ) 10 MG 24 hr tablet TAKE 1 TABLET(10 MG) BY MOUTH IN THE MORNING AND AT BEDTIME 180 tablet 0   amLODipine  (NORVASC ) 5 MG tablet Take 1 tablet (5 mg total) by mouth daily. 90 tablet 3   atorvastatin  (LIPITOR) 40 MG tablet Take 1 tablet (40 mg total) by mouth daily. 90 tablet 3   Blood Glucose Monitoring Suppl (ACCU-CHEK GUIDE ME) w/Device KIT Use to monitor blood sugar three times daily 1 kit 0   Cholecalciferol (VITAMIN D3) 50 MCG (2000 UT) capsule Take 2,000 Units by mouth daily.     dapagliflozin  propanediol (FARXIGA ) 10 MG TABS tablet Take 1 tablet (10 mg total) by mouth daily. 90 tablet 3   fluticasone  (FLONASE ) 50 MCG/ACT nasal spray SHAKE LIQUID AND USE 2 SPRAYS IN EACH NOSTRIL DAILY 48 g 1   gabapentin  (NEURONTIN ) 300 MG capsule TAKE 1 CAPSULE(300 MG) BY MOUTH AT BEDTIME 90 capsule 1   glipiZIDE  (GLIPIZIDE  XL) 5 MG 24 hr tablet Take 1 tablet (5 mg total) by mouth daily with breakfast. 90 tablet 1   glucose blood (ACCU-CHEK GUIDE TEST) test strip Use to check blood sugar before meals three times daily 100 each 12   Lancets  (EMBRACE PRESSURE ACTIVATED 28G) MISC Use to check blood sugar daily. Dx E11.65 100 each 0   Lancets (ONETOUCH DELICA PLUS LANCET33G) MISC Use as directed to check blood sugar daily. Dx E11.65 100 each 3   Lancets 28G Thin MISC 1 Lancet by Does not apply route daily. Dx E11.65  50 each 2   lisinopril  (ZESTRIL ) 40 MG tablet TAKE 1 TABLET(40 MG) BY MOUTH DAILY 90 tablet 1   metFORMIN  (GLUCOPHAGE -XR) 500 MG 24 hr tablet TAKE 2 TABLETS(1000 MG) BY MOUTH TWICE DAILY WITH A MEAL 360 tablet 3   NEEDLE, DISP, 18 G (BD DISP NEEDLES) 18G X 1-1/2 MISC 1 mg by Does not apply route every 14 (fourteen) days. 50 each 0   NEEDLE, DISP, 21 G (BD SAFETYGLIDE SHIELDED NEEDLE) 21G X 1-1/2 MISC Use 21guage needle to inject Testosterone  Cypionate 1ml. 25 each 0   omeprazole (PRILOSEC) 20 MG capsule Take 20 mg by mouth daily.     Protein POWD Take 1 Scoop by mouth daily.     silver  sulfADIAZINE  (SILVADENE ) 1 % cream Apply 1 Application topically daily. 50 g 1   sitaGLIPtin  (JANUVIA ) 100 MG tablet Take 1 tablet (100 mg total) by mouth daily. TAKE 1 TABLET(100 MG) BY MOUTH DAILY 90 tablet 1   Syringe, Disposable, (2-3CC SYRINGE) 3 ML MISC 1 mg by Does not apply route every 14 (fourteen) days. 25 each 3   Syringe/Needle, Disp, (SYRINGE 3CC/23GX1) 23G X 1 3 ML MISC Use as directed 50 each 0   testosterone  cypionate (DEPOTESTOSTERONE CYPIONATE) 200 MG/ML injection INJECT 0.5 MLS IN THE MUSCLE EVERY WEEK. DISCARD REMAINDER OF VIAL 10 mL 0   No current facility-administered medications for this visit.   Facility-Administered Medications Ordered in Other Visits  Medication Dose Route Frequency Provider Last Rate Last Admin   0.9 %  sodium chloride  infusion   Intravenous Once Babara Call, MD         PHYSICAL EXAMINATION: ECOG PERFORMANCE STATUS: 0 - Asymptomatic Vitals:   11/17/24 1417  BP: 117/79  Pulse: 99  Resp: 19  Temp: 98.6 F (37 C)  SpO2: 99%   Filed Weights   11/17/24 1417  Weight: 235 lb 8 oz (106.8  kg)    Physical Exam Constitutional:      General: He is not in acute distress. HENT:     Head: Normocephalic and atraumatic.  Eyes:     General: No scleral icterus. Cardiovascular:     Rate and Rhythm: Normal rate.  Pulmonary:     Effort: Pulmonary effort is normal. No respiratory distress.  Abdominal:     General: There is no distension.     Palpations: Abdomen is soft.  Musculoskeletal:        General: Normal range of motion.     Cervical back: Normal range of motion and neck supple.  Skin:    General: Skin is warm and dry.  Neurological:     Mental Status: He is alert and oriented to person, place, and time. Mental status is at baseline.  Psychiatric:        Mood and Affect: Mood normal.     RADIOGRAPHIC STUDIES: I have personally reviewed the radiological images as listed and agreed with the findings in the report. No results found.   LABORATORY DATA:  I have reviewed the data as listed    Latest Ref Rng & Units 11/17/2024    2:01 PM 05/18/2024    2:23 PM 11/18/2023    1:41 PM  CBC  WBC 4.0 - 10.5 K/uL 6.0  6.8  6.4   Hemoglobin 13.0 - 17.0 g/dL 83.8  83.0  83.2   Hematocrit 39.0 - 52.0 % 47.7  50.5  50.5   Platelets 150 - 400 K/uL 203  211  235  Latest Ref Rng & Units 06/24/2024    1:37 PM 01/07/2024    3:36 PM 10/06/2023    3:38 PM  CMP  Glucose 70 - 99 mg/dL 854  841  818   BUN 8 - 27 mg/dL 21  13  23    Creatinine 0.76 - 1.27 mg/dL 9.10  8.93  8.74   Sodium 134 - 144 mmol/L 143  143  137   Potassium 3.5 - 5.2 mmol/L 4.1  4.4  4.6   Chloride 96 - 106 mmol/L 105  104  100   CO2 20 - 29 mmol/L 20  24  19    Calcium  8.6 - 10.2 mg/dL 9.3  9.6  9.4   Total Protein 6.0 - 8.5 g/dL 6.2   6.8   Total Bilirubin 0.0 - 1.2 mg/dL 0.8   1.1   Alkaline Phos 44 - 121 IU/L 73   91   AST 0 - 40 IU/L 21   15   ALT 0 - 44 IU/L 25   22

## 2024-12-11 ENCOUNTER — Other Ambulatory Visit: Payer: Self-pay | Admitting: Urology

## 2024-12-11 DIAGNOSIS — E291 Testicular hypofunction: Secondary | ICD-10-CM

## 2024-12-12 NOTE — Progress Notes (Signed)
 Jonathan Fields                                          MRN: [id number]   12/12/2024   The [facility] Quality Team Specialist reviewed this patient medical record for the purposes of chart review for care gap closure. The following were reviewed: abstraction for care gap closure-glycemic status assessment.    [facility] Quality Team

## 2025-02-09 ENCOUNTER — Other Ambulatory Visit

## 2025-02-10 ENCOUNTER — Ambulatory Visit: Admitting: Urology

## 2025-02-14 ENCOUNTER — Encounter: Admitting: Family Medicine

## 2025-02-23 ENCOUNTER — Encounter: Admitting: Family Medicine

## 2025-05-18 ENCOUNTER — Inpatient Hospital Stay

## 2025-05-18 ENCOUNTER — Inpatient Hospital Stay: Admitting: Oncology
